# Patient Record
Sex: Male | Born: 1950
Health system: Southern US, Community
[De-identification: ages and names within clinical notes are randomized; demographics above are authoritative.]

## PROBLEM LIST (undated history)

## (undated) DIAGNOSIS — M66269 Spontaneous rupture of extensor tendons, unspecified lower leg: Secondary | ICD-10-CM

## (undated) DIAGNOSIS — M898X9 Other specified disorders of bone, unspecified site: Secondary | ICD-10-CM

## (undated) DIAGNOSIS — K219 Gastro-esophageal reflux disease without esophagitis: Secondary | ICD-10-CM

## (undated) DIAGNOSIS — I951 Orthostatic hypotension: Secondary | ICD-10-CM

## (undated) DIAGNOSIS — I10 Essential (primary) hypertension: Secondary | ICD-10-CM

## (undated) DIAGNOSIS — M7710 Lateral epicondylitis, unspecified elbow: Secondary | ICD-10-CM

## (undated) DIAGNOSIS — H269 Unspecified cataract: Secondary | ICD-10-CM

## (undated) DIAGNOSIS — D72819 Decreased white blood cell count, unspecified: Secondary | ICD-10-CM

## (undated) DIAGNOSIS — K5010 Crohn's disease of large intestine without complications: Secondary | ICD-10-CM

## (undated) DIAGNOSIS — I219 Acute myocardial infarction, unspecified: Secondary | ICD-10-CM

## (undated) DIAGNOSIS — K635 Polyp of colon: Secondary | ICD-10-CM

## (undated) DIAGNOSIS — E669 Obesity, unspecified: Secondary | ICD-10-CM

## (undated) DIAGNOSIS — I2510 Atherosclerotic heart disease of native coronary artery without angina pectoris: Secondary | ICD-10-CM

## (undated) DIAGNOSIS — E538 Deficiency of other specified B group vitamins: Secondary | ICD-10-CM

## (undated) DIAGNOSIS — K624 Stenosis of anus and rectum: Secondary | ICD-10-CM

## (undated) DIAGNOSIS — I236 Thrombosis of atrium, auricular appendage, and ventricle as current complications following acute myocardial infarction: Secondary | ICD-10-CM

## (undated) DIAGNOSIS — K5090 Crohn's disease, unspecified, without complications: Secondary | ICD-10-CM

## (undated) DIAGNOSIS — D539 Nutritional anemia, unspecified: Secondary | ICD-10-CM

## (undated) DIAGNOSIS — I255 Ischemic cardiomyopathy: Secondary | ICD-10-CM

## (undated) DIAGNOSIS — J069 Acute upper respiratory infection, unspecified: Secondary | ICD-10-CM

## (undated) DIAGNOSIS — J309 Allergic rhinitis, unspecified: Secondary | ICD-10-CM

## (undated) DIAGNOSIS — J302 Other seasonal allergic rhinitis: Secondary | ICD-10-CM

## (undated) DIAGNOSIS — E785 Hyperlipidemia, unspecified: Secondary | ICD-10-CM

## (undated) DIAGNOSIS — R42 Dizziness and giddiness: Secondary | ICD-10-CM

## (undated) DIAGNOSIS — I252 Old myocardial infarction: Secondary | ICD-10-CM

## (undated) HISTORY — DX: Dizziness and giddiness: R42

## (undated) HISTORY — DX: Crohn's disease of large intestine without complications: K50.10

## (undated) HISTORY — DX: Lateral epicondylitis, unspecified elbow: M77.10

## (undated) HISTORY — DX: Other seasonal allergic rhinitis: J30.2

## (undated) HISTORY — DX: Ischemic cardiomyopathy: I25.5

## (undated) HISTORY — DX: Gastro-esophageal reflux disease without esophagitis: K21.9

## (undated) HISTORY — DX: Unspecified cataract: H26.9

## (undated) HISTORY — DX: Nutritional anemia, unspecified: D53.9

## (undated) HISTORY — PX: TONSILLECTOMY AND ADENOIDECTOMY: SUR1326

## (undated) HISTORY — DX: Allergic rhinitis, unspecified: J30.9

## (undated) HISTORY — DX: Deficiency of other specified B group vitamins: E53.8

## (undated) HISTORY — DX: Decreased white blood cell count, unspecified: D72.819

## (undated) HISTORY — DX: Crohn's disease, unspecified, without complications: K50.90

## (undated) HISTORY — DX: Spontaneous rupture of extensor tendons, unspecified lower leg: M66.269

## (undated) HISTORY — DX: Essential (primary) hypertension: I10

## (undated) HISTORY — DX: Other specified disorders of bone, unspecified site: M89.8X9

## (undated) HISTORY — DX: Acute upper respiratory infection, unspecified: J06.9

## (undated) HISTORY — PX: COLONOSCOPY: SHX174

## (undated) HISTORY — DX: Polyp of colon: K63.5

## (undated) HISTORY — DX: Orthostatic hypotension: I95.1

## (undated) HISTORY — DX: Stenosis of anus and rectum: K62.4

## (undated) HISTORY — DX: Obesity, unspecified: E66.9

---
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
## 1998-02-23 ENCOUNTER — Encounter: Admission: RE | Admit: 1998-02-23 | Discharge: 1998-02-23 | Payer: Self-pay | Admitting: Sports Medicine

## 1999-03-15 ENCOUNTER — Encounter: Admission: RE | Admit: 1999-03-15 | Discharge: 1999-03-15 | Payer: Self-pay | Admitting: Sports Medicine

## 1999-11-16 ENCOUNTER — Ambulatory Visit (HOSPITAL_COMMUNITY): Admission: RE | Admit: 1999-11-16 | Discharge: 1999-11-16 | Payer: Self-pay | Admitting: Sports Medicine

## 1999-12-27 ENCOUNTER — Encounter: Admission: RE | Admit: 1999-12-27 | Discharge: 1999-12-27 | Payer: Self-pay | Admitting: Sports Medicine

## 2000-04-28 ENCOUNTER — Encounter: Admission: RE | Admit: 2000-04-28 | Discharge: 2000-04-28 | Payer: Self-pay | Admitting: Family Medicine

## 2001-04-02 ENCOUNTER — Encounter: Admission: RE | Admit: 2001-04-02 | Discharge: 2001-04-02 | Payer: Self-pay | Admitting: Family Medicine

## 2003-09-06 ENCOUNTER — Inpatient Hospital Stay (HOSPITAL_COMMUNITY): Admission: EM | Admit: 2003-09-06 | Discharge: 2003-09-08 | Payer: Self-pay | Admitting: Sports Medicine

## 2003-09-15 ENCOUNTER — Encounter: Admission: RE | Admit: 2003-09-15 | Discharge: 2003-09-15 | Payer: Self-pay | Admitting: Sports Medicine

## 2003-11-24 ENCOUNTER — Ambulatory Visit: Payer: Self-pay | Admitting: Sports Medicine

## 2004-01-24 ENCOUNTER — Ambulatory Visit: Payer: Self-pay | Admitting: Family Medicine

## 2004-03-13 ENCOUNTER — Ambulatory Visit: Payer: Self-pay | Admitting: Gastroenterology

## 2004-06-04 ENCOUNTER — Ambulatory Visit: Payer: Self-pay | Admitting: Sports Medicine

## 2005-01-17 ENCOUNTER — Ambulatory Visit: Payer: Self-pay | Admitting: Gastroenterology

## 2005-02-08 ENCOUNTER — Ambulatory Visit: Payer: Self-pay | Admitting: Gastroenterology

## 2005-02-08 ENCOUNTER — Encounter (INDEPENDENT_AMBULATORY_CARE_PROVIDER_SITE_OTHER): Payer: Self-pay | Admitting: *Deleted

## 2005-02-11 ENCOUNTER — Ambulatory Visit: Payer: Self-pay | Admitting: Gastroenterology

## 2005-03-02 ENCOUNTER — Emergency Department (HOSPITAL_COMMUNITY): Admission: EM | Admit: 2005-03-02 | Discharge: 2005-03-03 | Payer: Self-pay | Admitting: Emergency Medicine

## 2005-07-26 ENCOUNTER — Ambulatory Visit: Payer: Self-pay | Admitting: Gastroenterology

## 2005-08-07 ENCOUNTER — Ambulatory Visit: Payer: Self-pay | Admitting: Gastroenterology

## 2005-10-15 ENCOUNTER — Ambulatory Visit: Payer: Self-pay | Admitting: Sports Medicine

## 2005-11-21 ENCOUNTER — Ambulatory Visit: Payer: Self-pay | Admitting: Sports Medicine

## 2005-12-17 ENCOUNTER — Ambulatory Visit: Payer: Self-pay | Admitting: Sports Medicine

## 2005-12-17 ENCOUNTER — Ambulatory Visit (HOSPITAL_COMMUNITY): Admission: RE | Admit: 2005-12-17 | Discharge: 2005-12-17 | Payer: Self-pay | Admitting: Sports Medicine

## 2006-02-18 ENCOUNTER — Ambulatory Visit: Payer: Self-pay | Admitting: Sports Medicine

## 2006-05-22 DIAGNOSIS — K5090 Crohn's disease, unspecified, without complications: Secondary | ICD-10-CM | POA: Insufficient documentation

## 2006-05-22 DIAGNOSIS — R42 Dizziness and giddiness: Secondary | ICD-10-CM

## 2006-05-22 DIAGNOSIS — J309 Allergic rhinitis, unspecified: Secondary | ICD-10-CM | POA: Insufficient documentation

## 2006-05-22 DIAGNOSIS — J4520 Mild intermittent asthma, uncomplicated: Secondary | ICD-10-CM

## 2006-05-22 DIAGNOSIS — L2089 Other atopic dermatitis: Secondary | ICD-10-CM

## 2006-05-22 DIAGNOSIS — E669 Obesity, unspecified: Secondary | ICD-10-CM | POA: Insufficient documentation

## 2006-05-22 DIAGNOSIS — J45909 Unspecified asthma, uncomplicated: Secondary | ICD-10-CM | POA: Insufficient documentation

## 2006-07-10 ENCOUNTER — Telehealth: Payer: Self-pay | Admitting: *Deleted

## 2006-09-09 ENCOUNTER — Ambulatory Visit: Payer: Self-pay | Admitting: Family Medicine

## 2006-09-09 ENCOUNTER — Telehealth: Payer: Self-pay | Admitting: *Deleted

## 2006-09-09 DIAGNOSIS — J069 Acute upper respiratory infection, unspecified: Secondary | ICD-10-CM | POA: Insufficient documentation

## 2006-12-19 ENCOUNTER — Telehealth: Payer: Self-pay | Admitting: *Deleted

## 2007-03-04 ENCOUNTER — Telehealth: Payer: Self-pay | Admitting: *Deleted

## 2007-10-27 ENCOUNTER — Ambulatory Visit: Payer: Self-pay | Admitting: Sports Medicine

## 2007-10-27 DIAGNOSIS — M898X9 Other specified disorders of bone, unspecified site: Secondary | ICD-10-CM | POA: Insufficient documentation

## 2007-10-27 DIAGNOSIS — M66269 Spontaneous rupture of extensor tendons, unspecified lower leg: Secondary | ICD-10-CM | POA: Insufficient documentation

## 2007-11-04 ENCOUNTER — Ambulatory Visit: Payer: Self-pay | Admitting: Sports Medicine

## 2008-01-13 ENCOUNTER — Telehealth: Payer: Self-pay | Admitting: Gastroenterology

## 2008-01-19 ENCOUNTER — Telehealth: Payer: Self-pay | Admitting: Gastroenterology

## 2008-01-26 ENCOUNTER — Ambulatory Visit: Payer: Self-pay | Admitting: Sports Medicine

## 2008-01-26 DIAGNOSIS — M7710 Lateral epicondylitis, unspecified elbow: Secondary | ICD-10-CM | POA: Insufficient documentation

## 2008-02-08 DIAGNOSIS — D519 Vitamin B12 deficiency anemia, unspecified: Secondary | ICD-10-CM

## 2008-02-08 DIAGNOSIS — K219 Gastro-esophageal reflux disease without esophagitis: Secondary | ICD-10-CM | POA: Insufficient documentation

## 2008-02-12 ENCOUNTER — Ambulatory Visit: Payer: Self-pay | Admitting: Gastroenterology

## 2008-02-12 LAB — CONVERTED CEMR LAB
Albumin: 3.8 g/dL (ref 3.5–5.2)
BUN: 15 mg/dL (ref 6–23)
Calcium: 9 mg/dL (ref 8.4–10.5)
Eosinophils Relative: 0.5 % (ref 0.0–5.0)
Folate: >20 ng/mL
GFR calc Af Amer: 99 mL/min
Glucose, Bld: 130 mg/dL — ABNORMAL HIGH (ref 70–99)
HCT: 42.1 % (ref 39.0–52.0)
Hemoglobin: 14.3 g/dL (ref 13.0–17.0)
Iron: 132 ug/dL (ref 42–165)
MCV: 105.3 fL — ABNORMAL HIGH (ref 78.0–100.0)
Monocytes Absolute: 0.6 10*3/uL (ref 0.1–1.0)
Monocytes Relative: 11.3 % (ref 3.0–12.0)
Neutro Abs: 3 10*3/uL (ref 1.4–7.7)
RDW: 13.7 % (ref 11.5–14.6)
Saturation Ratios: 47.1 % (ref 20.0–50.0)
Total Protein: 6.4 g/dL (ref 6.0–8.3)
Transferrin: 200.3 mg/dL — ABNORMAL LOW (ref >=212.0)
WBC: 4.9 10*3/uL (ref 4.5–10.5)

## 2008-02-26 ENCOUNTER — Encounter: Payer: Self-pay | Admitting: Gastroenterology

## 2008-02-26 ENCOUNTER — Ambulatory Visit: Payer: Self-pay | Admitting: Gastroenterology

## 2008-02-26 DIAGNOSIS — K635 Polyp of colon: Secondary | ICD-10-CM

## 2008-02-26 HISTORY — DX: Polyp of colon: K63.5

## 2008-03-01 ENCOUNTER — Encounter: Payer: Self-pay | Admitting: Gastroenterology

## 2008-03-09 ENCOUNTER — Ambulatory Visit: Payer: Self-pay | Admitting: Family Medicine

## 2008-03-09 ENCOUNTER — Encounter: Payer: Self-pay | Admitting: Sports Medicine

## 2008-03-09 DIAGNOSIS — R7303 Prediabetes: Secondary | ICD-10-CM

## 2008-03-09 LAB — CONVERTED CEMR LAB
Cholesterol: 207 mg/dL — ABNORMAL HIGH (ref 0–200)
Total CHOL/HDL Ratio: 3.3
Triglycerides: 86 mg/dL (ref <150)
VLDL: 17 mg/dL (ref 0–40)

## 2008-03-30 ENCOUNTER — Encounter: Payer: Self-pay | Admitting: Gastroenterology

## 2008-03-30 ENCOUNTER — Telehealth: Payer: Self-pay | Admitting: Gastroenterology

## 2008-12-09 ENCOUNTER — Telehealth (INDEPENDENT_AMBULATORY_CARE_PROVIDER_SITE_OTHER): Payer: Self-pay | Admitting: *Deleted

## 2009-03-15 ENCOUNTER — Telehealth: Payer: Self-pay | Admitting: Gastroenterology

## 2009-04-17 ENCOUNTER — Telehealth: Payer: Self-pay | Admitting: Gastroenterology

## 2009-04-20 ENCOUNTER — Telehealth: Payer: Self-pay | Admitting: Gastroenterology

## 2009-04-24 ENCOUNTER — Encounter: Payer: Self-pay | Admitting: Gastroenterology

## 2009-04-27 ENCOUNTER — Ambulatory Visit: Payer: Self-pay | Admitting: Gastroenterology

## 2009-04-27 DIAGNOSIS — K5010 Crohn's disease of large intestine without complications: Secondary | ICD-10-CM | POA: Insufficient documentation

## 2009-04-27 DIAGNOSIS — K5090 Crohn's disease, unspecified, without complications: Secondary | ICD-10-CM | POA: Insufficient documentation

## 2009-04-27 DIAGNOSIS — K209 Esophagitis, unspecified without bleeding: Secondary | ICD-10-CM | POA: Insufficient documentation

## 2009-04-27 LAB — CONVERTED CEMR LAB
AST: 30 units/L (ref 0–37)
BUN: 15 mg/dL (ref 6–23)
Basophils Relative: 0.5 % (ref 0.0–3.0)
Calcium: 9.7 mg/dL (ref 8.4–10.5)
Creatinine, Ser: 1.1 mg/dL (ref 0.4–1.5)
Eosinophils Absolute: 0 10*3/uL (ref 0.0–0.7)
Ferritin: 214.9 ng/mL (ref 22.0–322.0)
Folate: >20 ng/mL
GFR calc non Af Amer: 72.63 mL/min (ref >60)
Iron: 137 ug/dL (ref 42–165)
MCHC: 32.7 g/dL (ref 30.0–36.0)
MCV: 106.9 fL — ABNORMAL HIGH (ref 78.0–100.0)
Monocytes Absolute: 0.4 10*3/uL (ref 0.1–1.0)
Neutrophils Relative %: 63 % (ref 43.0–77.0)
Potassium: 5.1 meq/L (ref 3.5–5.1)
RBC: 4.15 M/uL — ABNORMAL LOW (ref 4.22–5.81)
TSH: 0.77 microintl units/mL (ref 0.35–5.50)
Total Bilirubin: 1.1 mg/dL (ref 0.3–1.2)
Vitamin B-12: 296 pg/mL (ref 211–911)

## 2009-07-24 ENCOUNTER — Telehealth (INDEPENDENT_AMBULATORY_CARE_PROVIDER_SITE_OTHER): Payer: Self-pay | Admitting: *Deleted

## 2010-04-26 NOTE — Progress Notes (Signed)
Summary: needs lab orders  Phone Note Call from Patient Call back at Work Phone [phone]   Caller: Patient Call For: Sharlett Iles Reason for Call: Talk to Nurse Summary of Call: Patient wants order for labs put in the system before his appt 04-27-09. Patient would like refills on his rx until his appt day. Initial call taken by: Ronalee Red,  April 17, 2009 9:17 AM  Follow-up for Phone Call        Pt is sch for OV 04/27/09.  Pt on 6MP.  Has not had any lab work done in a while.  What labs are needed?  (pt will come prior to OV for labs) Follow-up by: Alberteen Spindle RN,  April 17, 2009 9:59 AM  Additional Follow-up for Phone Call Additional follow up Details #1::        CBC,SMA 20 SED RATE Additional Follow-up by: Sable Feil MD Marval Regal,  April 17, 2009 11:45 AM    Additional Follow-up for Phone Call Additional follow up Details #2::    Lab order put in [facility].  LM for pt to call re which med he needs refilled. Butch Penny Surface RN  April 17, 2009 11:50 AM  Pt called back.  Needs refill on 6MP and Folic acid sent to [facility] on [address].    Follow-up by: Alberteen Spindle RN,  April 17, 2009 2:28 PM  Prescriptions: MERCAPTOPURINE 50 MG TABS (MERCAPTOPURINE) Pt takes 100 mg every am once daily  #60 Tablet x 1   Entered by:   Alberteen Spindle RN   Authorized by:   Sable Feil MD [facility]   Signed by:   Alberteen Spindle RN on 04/17/2009   Method used:   Electronically to        [id number] (retail)       [address]       [place], [place]  [address]       Ph: [phone] or [phone]       Fax: [phone]   RxID:   [id number] FOLIC ACID 1 MG TABS (FOLIC ACID) TAKE ONE by mouth once daily  #30 Tablet x 1   Entered by:   Alberteen Spindle RN   Authorized by:   Sable Feil MD [facility]   Signed by:   Alberteen Spindle RN on 04/17/2009   Method used:   Electronically to        [id number] (retail)       [address]       [place],   [address]     Ph: [phone] or [phone]       Fax: [phone]   RxID:   [id number]

## 2010-04-26 NOTE — Progress Notes (Signed)
Summary: needs prior auth  Phone Note Call from Patient Call back at Home Phone [phone]   Caller: Patient Call For: Sharlett Iles Reason for Call: Talk to Nurse Summary of Call: Patient needs prior auth for his Nexium refill Initial call taken by: Ronalee Red,  April 20, 2009 1:41 PM  Follow-up for Phone Call        Form requested form [employer].  To be faxed shortly. Butch Penny Surface RN  April 24, 2009 9:18 AM  Nexium approved until 04/24/10. Follow-up by: Alberteen Spindle RN,  April 25, 2009 8:27 AM

## 2010-04-26 NOTE — Medication Information (Signed)
Summary: Prior [employer] Approved for Nexium/[employer]  Prior [employer] Approved for Nexium/[employer]   Imported By: Phillis Knack 05/04/2009 10:26:31  _____________________________________________________________________  External Attachment:    Type:   Image     Comment:   External Document

## 2010-04-26 NOTE — Assessment & Plan Note (Signed)
Summary: rx renewal...em   History of Present Illness Visit Type: Follow-up Visit Primary GI MD: Verl Blalock MD FACP FAGA Primary Provider: [facility] Requesting Provider: n/a Chief Complaint: f/u Crohns disease. Pt states he is doing good and needs refills on 80m and folic acid.  History of Present Illness:   60year old white male with chronic Crohn's disease of the colon currently in remission on 6-MP 100 mg a day. He takes Nexium 40 mg daily for acid reflux, also folic acid 1 mg a day. He has asthmatic bronchitis and uses p.r.n. steroids. He is asymptomatic terms of any cardiovascular, pulmonary, gastrointestinal, or genitourinary problems. Last labs were reviewed from a year ago were all normal. Colonoscopy one year ago shows chronic scarring with pseudopolyposis but no dysplasia on biopsies.   GI Review of Systems      Denies abdominal pain, acid reflux, belching, bloating, chest pain, dysphagia with liquids, dysphagia with solids, heartburn, loss of appetite, nausea, vomiting, vomiting blood, weight loss, and  weight gain.        Denies anal fissure, black tarry stools, change in bowel habit, constipation, diarrhea, diverticulosis, fecal incontinence, heme positive stool, hemorrhoids, irritable bowel syndrome, jaundice, light color stool, liver problems, rectal bleeding, and  rectal pain.    Current Medications (verified): 1)  Advair Hfa 115-21 Mcg/act Aero (Fluticasone-Salmeterol) .... Inhale 1 Puff As Directed Twice A Day 2)  Flonase 50 Mcg/act Susp (Fluticasone Propionate) .... Spray 1 Puff Into Both Nostrils Once A Day 3)  Folic Acid 1 Mg Tabs (Folic Acid) .... Take One By Mouth Once Daily 4)  Mercaptopurine 50 Mg Tabs (Mercaptopurine) .... Pt Takes 100 Mg Every Am Once Daily 5)  Nexium 40 Mg Cpdr (Esomeprazole Magnesium) ..Marland Kitchen. 1 Once Daily One Hour Before A Meal 6)  Proventil Hfa 108 (90 Base) Mcg/act Aers (Albuterol Sulfate) .... Inhale 2 Puff Every  Four Hours 7)  Singulair 10 Mg Tabs (Montelukast Sodium) .... Take One Tablet Daily 8)  Proventil Hfa 108 (90 Base) Mcg/act Aers (Albuterol Sulfate) .... As Needed  Allergies (verified): 1)  Amoxicillin (Amoxicillin) 2)  Sulfamethoxazole (Sulfamethoxazole)  Past History:  Past medical, surgical, family and social histories (including risk factors) reviewed for relevance to current acute and chronic problems.  Past Medical History: Reviewed history from 02/08/2008 and no changes required. currently on 100 6 mp daily with monitoring, experimental trial on interleukin 10  Current Problems:  GERD (ICD-530.81) B12 DEFICIENCY (ICD-266.2) LATERAL EPICONDYLITIS, LEFT (ICD-726.32) BONE SPUR (ICD-726.91) PATELLAR TENDON RUPTURE (ICD-727.66) URI (ICD-465.9) VERTIGO NOS OR DIZZINESS (ICD-780.4) RHINITIS, ALLERGIC (ICD-477.9) OBESITY, NOS (ICD-278.00) ECZEMA, ATOPIC DERMATITIS (ICD-691.8) CROHN'S DISEASE (ICD-555.9) ASTHMA, UNSPECIFIED (ICD-493.90)  Past Surgical History: Reviewed history from 02/08/2008 and no changes required. Tonsillectomy and adenoidectomy as a child  Family History: Reviewed history from 02/12/2008 and no changes required. Brother 655 DM, Father dead- 630yrs. Diabetes, circulatory, MI, Mother dead- 927yrs., Sister 664a&w No FH of Colon Cancer:  Social History: Reviewed history from 02/12/2008 and no changes required. 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; non smoker; rare any etoh Illicit Drug Use - no  Review of Systems  The patient denies allergy/sinus, anemia, anxiety-new, arthritis/joint pain, back pain, blood in urine, breast changes/lumps, change in vision, confusion, cough, coughing up blood, depression-new, fainting, fatigue, fever, headaches-new, hearing problems, heart murmur, heart rhythm changes, itching, menstrual pain, muscle pains/cramps, night sweats, nosebleeds, pregnancy symptoms, shortness of breath, skin rash,  sleeping problems, sore throat, swelling of  feet/legs, swollen lymph glands, thirst - excessive , urination - excessive , urination changes/pain, urine leakage, vision changes, and voice change.    Vital Signs:  Patient profile:   60 year old male Height:      67 inches Weight:      186.50 pounds BMI:     29.32 Pulse rate:   80 / minute Pulse rhythm:   regular BP sitting:   112 / 72  (right arm) Cuff size:   regular  Vitals Entered By: Marlon Pel CMA Deborra Medina) (April 27, 2009 8:38 AM)  Physical Exam  General:  Well developed, well nourished, no acute distress.healthy appearing.   Head:  Normocephalic and atraumatic. Eyes:  PERRLA, no icterus.exam deferred to patient's ophthalmologist.   Lungs:  Clear throughout to auscultation. Heart:  Regular rate and rhythm; no murmurs, rubs,  or bruits. Abdomen:  Soft, nontender and nondistended. No masses, hepatosplenomegaly or hernias noted. Normal bowel sounds. Extremities:  No clubbing, cyanosis, edema or deformities noted. Neurologic:  Alert and  oriented x4;  grossly normal neurologically. Psych:  Alert and cooperative. Normal mood and affect.   Impression & Recommendations:  Problem # 1:  CROHN'S DISEASE, LARGE INTESTINE (ICD-555.1) Assessment Improved Check yearly labs and continue all medications as outlined above. Yearly followup or p.r.n. as needed. Orders: TLB-CBC Platelet - w/Differential (85025-CBCD) TLB-BMP (Basic Metabolic Panel-BMET) (40981-XBJYNWG) TLB-Hepatic/Liver Function Pnl (80076-HEPATIC) TLB-TSH (Thyroid Stimulating Hormone) (84443-TSH) TLB-B12, Serum-Total ONLY (95621-H08) TLB-Ferritin (65784-ONG) TLB-Folic Acid (Folate) (29528-UXL) TLB-IBC Pnl (Iron/FE;Transferrin) (83550-IBC) TLB-PSA (Prostate Specific Antigen) (84153-PSA)  Problem # 2:  GERD (ICD-530.81) Assessment: Improved Continue chronic reflux regime and daily Nexium therapy.  Problem # 3:  B12 DEFICIENCY (ICD-266.2) Assessment:  Unchanged Repeat anemia profile.  Problem # 4:  ASTHMA, UNSPECIFIED (ICD-493.90) Assessment: Improved Followup With Dr. Oneida Alar As Needed  Patient Instructions: 1)  Copy sent to : Dr. Hector Shade 2)  Please continue current medications.  3)  Labs Pending 4)  Please schedule a follow-up appointment in 1 year. 5)  The medication list was reviewed and reconciled.  All changed / newly prescribed medications were explained.  A complete medication list was provided to the patient / caregiver.

## 2010-04-26 NOTE — Progress Notes (Signed)
  Phone Note Other Incoming   Request: Send information Summary of Call: Request for records received from [facility]. Request forwarded to [facility].

## 2010-05-14 ENCOUNTER — Encounter: Payer: Self-pay | Admitting: Gastroenterology

## 2010-05-22 NOTE — Medication Information (Signed)
Summary: Approved/[employer]  Approved/[employer]   Imported By: Bubba Hales 05/17/2010 09:09:57  _____________________________________________________________________  External Attachment:    Type:   Image     Comment:   External Document

## 2010-08-10 NOTE — Discharge Summary (Signed)
NAME:  Darryl [name], Darryl [name] NO.:  [id number]   MEDICAL RECORD NO.:  [id number]                   PATIENT TYPE:  INP   LOCATION:  9169                                 FACILITY:  [facility]   PHYSICIAN:  Billey Chang, M.D.                  DATE OF BIRTH:  22-Oct-1950   DATE OF ADMISSION:  09/06/2003  DATE OF DISCHARGE:  09/08/2003                                 DISCHARGE SUMMARY   PRIMARY CARE PHYSICIAN:  Peterson Ao B. Oneida Alar, M.D.   REFERRING PHYSICIAN:  Dr. Oneida Alar.   CONSULTATIONS:  None.   DISCHARGE DIAGNOSES:  1. Cellulitis of the left foot with lymphangitic spread to the left groin.  2. Crohn's disease.  3. Asthma.  4. Allergic rhinitis.   PROCEDURE:  None.   HISTORY OF PRESENT ILLNESS:  Please see the chart for full details, but in  short, the patient is a 60 year old male with a history of Crohn's disease,  who presented on the day of admission with a one-day history of left foot  pain with redness and some swelling.  The patient was able to mow the lawn  the day prior to admission and ran at the gym on that a.m. with minimal  discomfort.  However, on the day of admission, the patient started noticing  fevers and pain up into his left groin after running on the treadmill and  noticed some streaking redness of his leg after running.  The patient denies  history of insect bite or traumatic injury at the site of infection and  denies previous history of similar event.  Denies decreased energy, nausea,  vomiting, and diarrhea.   LABORATORY DATA:  Blood cultures negative x2 at the time of discharge.  WBC  8.4, hemoglobin 14.4, hematocrit 41.1, platelets 254.  Sodium 142, potassium  4.2, chloride 107, CO2 29, BUN 11, creatinine 1.0, platelets 99.   HOSPITAL COURSE:  1. Left cellulitis with lymphadenitis spread to the left groin.  There is no     clear history of source of infection.  There was no obvious site of     insect bite or site of injury.  However,  on admission the patient had an     increased white blood cell count of 16.1 and was having low grade fevers     and was febrile at 100.8 on admission.  Given the patient's streaking up     the leg and the fact that the patient was on 6MP for his Crohn's disease     making him slightly immunocompromised, he was started initially on     Clindamycin IV to cover broad spectrum of organisms including community-     acquired MRSA.  This antibiotic was also chosen as the patient had a     penicillin allergy and was admitted early in the morning and we were     unable to contact his  primary physician to determine whether or not the     patient could tolerate cephalosporins.  After contact with his primary     physician, it was determined that he could tolerate cephalosporins, the     patient's antibiotic regimen was changed to Ceftriaxone.  The patient     tolerated two days of Ceftriaxone well.  He continued to be febrile for     another day and a half on the Ceftriaxone, but did have decreasing     erythema and redness in both his groin and the area of presumed     cellulitis over his left foot.  At the time of discharge, the patient had     been afebrile for a day on the IV ceftriaxone and therefore was     discharged on p.o. Keflex 500 mg p.o. q.i.d. with follow-up at one weeks'     time with Dr. Oneida Alar on September 15, 2003, at 10 a.m.   1. Crohn's disease.  The patient was maintained on his outpatient regimen of     6MP 100 mg p.o. daily and had no difficulty throughout his     hospitalization.   1. Asthma.  The patient had no difficulty with asthma throughout his     hospitalization and was maintained on his home medications.   1. GERD.  The patient was maintained on his home medications throughout     hospitalization with no difficulty.   1. Allergic rhinitis.  The patient was continued on his home medications     with no complications throughout hospitalization.   DISCHARGE  INSTRUCTIONS:  The patient was instructed to return to emergency  room or to call the clinic if he developed high fevers or returning fevers  or increasing redness or swelling in his foot or groin area and to follow up  with Dr. Oneida Alar on September 15, 2003, at 10 a.m. and to complete his course of  Keflex 500 mg for 10 days.   DISCHARGE MEDICATIONS:  1. Calcium carbonate p.o. 500 mg p.o. b.i.d.  2. Flonase two puffs daily.  3. Flovent 220 one puff b.i.d.  4. Prevacid 30 mg p.o. daily.  5. Proventil two puffs q.4h p.r.n.  6. Singulair 10 mg p.o. daily.  7. Theo-Dur 400 mg p.o. daily.  8. Vitamin D and Folic acid one tablet each p.o. daily.  9. 6MP 100 mg p.o. daily.  10.      Keflex 500 mg p.o. q.i.d. (four times a day) for 10 days.   DNR STATUS:  Full code.      Manus Rudd, MD                          Billey Chang, M.D.    SJ/MEDQ  D:  09/08/2003  T:  09/09/2003  Job:  [id number]   cc:   Wolfgang Phoenix. Oneida Alar, M.D.  Fax: [phone]

## 2010-10-29 ENCOUNTER — Other Ambulatory Visit: Payer: Self-pay | Admitting: Sports Medicine

## 2010-12-07 ENCOUNTER — Other Ambulatory Visit: Payer: Self-pay | Admitting: *Deleted

## 2010-12-07 MED ORDER — MONTELUKAST SODIUM 10 MG PO TABS: 10.0000 mg | ORAL_TABLET | Freq: Every day | ORAL | Status: DC

## 2011-02-28 ENCOUNTER — Telehealth: Payer: Self-pay | Admitting: Sports Medicine

## 2011-03-05 ENCOUNTER — Other Ambulatory Visit: Payer: Self-pay | Admitting: Gastroenterology

## 2011-03-08 ENCOUNTER — Other Ambulatory Visit: Payer: Self-pay | Admitting: *Deleted

## 2011-03-08 ENCOUNTER — Encounter: Payer: Self-pay | Admitting: Gastroenterology

## 2011-03-08 ENCOUNTER — Telehealth: Payer: Self-pay | Admitting: Gastroenterology

## 2011-03-08 MED ORDER — ESOMEPRAZOLE MAGNESIUM 40 MG PO CPDR: 40.0000 mg | DELAYED_RELEASE_CAPSULE | Freq: Every day | ORAL | Status: DC

## 2011-03-08 NOTE — Telephone Encounter (Signed)
ERROR

## 2011-03-08 NOTE — Telephone Encounter (Signed)
Left message that patients meds were denied bc he needs office before he gets a refill, if he makes an office visit he can have one month refill.

## 2011-03-11 ENCOUNTER — Other Ambulatory Visit: Payer: Self-pay | Admitting: Gastroenterology

## 2011-03-11 MED ORDER — MERCAPTOPURINE 50 MG PO TABS: ORAL_TABLET | ORAL | Status: DC

## 2011-03-11 MED ORDER — FOLIC ACID 1 MG PO TABS: 1.0000 mg | ORAL_TABLET | Freq: Every day | ORAL | Status: DC

## 2011-03-11 NOTE — Telephone Encounter (Signed)
rxs sent to pharmacy, pt aware.

## 2011-03-21 ENCOUNTER — Other Ambulatory Visit: Payer: Self-pay | Admitting: Sports Medicine

## 2011-03-22 ENCOUNTER — Encounter: Payer: Self-pay | Admitting: *Deleted

## 2011-03-28 ENCOUNTER — Encounter: Payer: Self-pay | Admitting: Gastroenterology

## 2011-03-28 ENCOUNTER — Other Ambulatory Visit (INDEPENDENT_AMBULATORY_CARE_PROVIDER_SITE_OTHER): Payer: BC Managed Care – PPO

## 2011-03-28 ENCOUNTER — Ambulatory Visit (INDEPENDENT_AMBULATORY_CARE_PROVIDER_SITE_OTHER): Payer: BC Managed Care – PPO | Admitting: Gastroenterology

## 2011-03-28 ENCOUNTER — Telehealth: Payer: Self-pay | Admitting: *Deleted

## 2011-03-28 VITALS — BP 138/84 | HR 65 | Ht 67.0 in | Wt 186.6 lb

## 2011-03-28 DIAGNOSIS — K5090 Crohn's disease, unspecified, without complications: Secondary | ICD-10-CM

## 2011-03-28 LAB — CBC WITH DIFFERENTIAL/PLATELET
Basophils Absolute: 0 10*3/uL (ref 0.0–0.1)
Basophils Relative: 0.5 % (ref 0.0–3.0)
Eosinophils Absolute: 0 10*3/uL (ref 0.0–0.7)
Lymphocytes Relative: 32 % (ref 12.0–46.0)
MCHC: 34.2 g/dL (ref 30.0–36.0)
Neutrophils Relative %: 59.3 % (ref 43.0–77.0)
Platelets: 326 10*3/uL (ref 150.0–400.0)
RBC: 3.91 Mil/uL — ABNORMAL LOW (ref 4.22–5.81)
RDW: 13.4 % (ref 11.5–14.6)

## 2011-03-28 LAB — BASIC METABOLIC PANEL
Calcium: 9.2 mg/dL (ref 8.4–10.5)
GFR: 82.46 mL/min (ref >60.00)
Glucose, Bld: 122 mg/dL — ABNORMAL HIGH (ref 70–99)
Sodium: 141 mEq/L (ref 135–145)

## 2011-03-28 LAB — HEPATIC FUNCTION PANEL
Alkaline Phosphatase: 70 U/L (ref 39–117)
Bilirubin, Direct: 0.1 mg/dL (ref 0.0–0.3)
Total Bilirubin: 0.8 mg/dL (ref 0.3–1.2)

## 2011-03-28 LAB — IBC PANEL: Iron: 157 ug/dL (ref 42–165)

## 2011-03-28 MED ORDER — MERCAPTOPURINE 50 MG PO TABS: ORAL_TABLET | ORAL | Status: DC

## 2011-03-28 NOTE — Progress Notes (Signed)
This is a 61 year old Caucasian male with Crohn's colitis over 20 years, currently in remission on 6-MP 100 mg a day. Last colonoscopy was in December 2009 and was unremarkable except for left colon pseudopolyposis. Random biopsies showed no evidence of dysplasia. The patient denies any general medical or gastrointestinal problems at this time. He has regular bowel movements without melena or hematochezia. His appetite is good and his weight is stable. He is followed by Dr. Oneida Alar for his asthma, he uses when necessary inhalers. The patient has noticed that he will have watery diarrhea if he forgets his 6-MP dosage.  Current Medications, Allergies, Past Medical History, Past Surgical History, Family History and Social History were reviewed in [employer] record.  Pertinent Review of Systems Negative   Physical Exam: Healthy appearing patient in no distress. He does have some facial telangiectasias but no other evidence of chronic liver disease. Chest is clear cardiac exam shows a regular rhythm without murmurs gallops or rubs. There is no organomegaly, abdominal masses or tenderness. Bowel sounds are normal. Peripheral extremities are unremarkable. Mental status normal.    Assessment and Plan: Chronic inflammatory bowel disease in remission on 6-MP therapy. I have suggested to the patient we perhaps discontinue 6-MP, but he is reluctant to do so because the cause of past experiences. I have reviewed the increased risk of lymphoma with the patient, and he has opted to continue with yearly checkups and lab examinations. He is due for followup colonoscopy with dysplasia screening, he is to call the sprain to schedule this procedure. Encounter Diagnosis  Name Primary?  . Regional enteritis of unspecified site Yes

## 2011-03-28 NOTE — Telephone Encounter (Signed)
Pt aware.

## 2011-03-28 NOTE — Patient Instructions (Signed)
Please go to the basement today for your labs.  Your prescription(s) have been sent to you pharmacy.  Call back in the Spring to schedule you Previsit and Colonoscopy.

## 2011-03-28 NOTE — Telephone Encounter (Signed)
Message copied by Sheral Flow on Thu Mar 28, 2011  2:24 PM ------      Message from: Sharlett Iles, DAVID R      Created: Thu Mar 28, 2011  2:10 PM       Have him stop any multivitamins or other products with iron.

## 2011-04-05 ENCOUNTER — Other Ambulatory Visit: Payer: Self-pay | Admitting: Gastroenterology

## 2011-04-09 ENCOUNTER — Ambulatory Visit (INDEPENDENT_AMBULATORY_CARE_PROVIDER_SITE_OTHER): Payer: BC Managed Care – PPO | Admitting: Sports Medicine

## 2011-04-09 DIAGNOSIS — J45909 Unspecified asthma, uncomplicated: Secondary | ICD-10-CM

## 2011-04-09 DIAGNOSIS — J309 Allergic rhinitis, unspecified: Secondary | ICD-10-CM

## 2011-04-09 DIAGNOSIS — J329 Chronic sinusitis, unspecified: Secondary | ICD-10-CM

## 2011-04-09 DIAGNOSIS — K5090 Crohn's disease, unspecified, without complications: Secondary | ICD-10-CM

## 2011-04-09 MED ORDER — FLUTICASONE PROPIONATE 50 MCG/ACT NA SUSP: 1.0000 | Freq: Every day | NASAL | Status: DC

## 2011-04-09 NOTE — Assessment & Plan Note (Signed)
We will renew his fluticasone.  However with the chronic sinus congestion and periodic sinus infections I think we need to rule out whether he has chronic sinusitis. He may also need endoscopic evaluation of his nasal passageway because of the recurrent bleeding.  . Schedule a limited CT of the sinus

## 2011-04-09 NOTE — Assessment & Plan Note (Signed)
He has been well controlled on his current regimen. He will continue see Dr. Sharlett Iles and may wean his 6-mercaptopurine somewhat.

## 2011-04-09 NOTE — Assessment & Plan Note (Signed)
This seems to be stable and did not make any changes today. He seems to have gotten a lot of relief from the 6 mercaptopurine that is used for his Crohn's disease./ He does plan to wean to 50 mg a day and will need to see if that affects his asthma.

## 2011-04-09 NOTE — Progress Notes (Signed)
  Subjective:    Patient ID: Darryl [name], male    DOB: July 21, 1950, 61 y.o.   MRN: [id number]  HPI  Patient is doing OK with no flares from Crohn's recently  Asthma has been pretty stable Recent URI did not worsen this Left nostril seems closed with poor air flow Has been on 6 MP for years for Crohn's but this also helps his asthma He is sensitive to weather- cold exacerbates asthma. No problems with coughing when he exercises. Uses fluticasone spray, helps but he is out of this now.  Peak flows normally run 500-550. Has started cycling.  Has increased snoring, feels tired when he wakes up.   Runs on treadmill and works out at [employer], does not run outside. None of these activities seem to be causing him to wheeze or cough.  Note that he does not feel that he can breathe out of his left nostril. While we had treated him for allergic rhinitis he continues to have bleeding from this nostril and periodic symptoms of sinus infections.    Review of Systems     Objective:   Physical Exam   NAD Ears- TMs normal bilat Nose- rt turbinates swollen, lt turbinate has bleeding on medial complex Throat- no irritation Lungs- clear, no wheezing      Assessment & Plan:

## 2011-04-10 ENCOUNTER — Ambulatory Visit
Admission: RE | Admit: 2011-04-10 | Discharge: 2011-04-10 | Disposition: A | Discharge status: Home / Self Care | Payer: BC Managed Care – PPO | Source: Ambulatory Visit | Attending: Sports Medicine | Admitting: Sports Medicine

## 2011-04-10 IMAGING — CT CT PARANASAL SINUSES LIMITED
1 series · 9 of 11 positions shown, 12 images · non-contrast
Comparison: None.

CLINICAL DATA: 60-year-old male with facial pressure, congestion,
headache, question sinusitis.

CT LIMITED SINUSES WITHOUT CONTRAST
TECHNIQUE: Multidetector CT images of the paranasal sinuses were
obtained in a single plane without contrast.

[Series 3: coronal soft · axial · 0.33mm/px · z∈[+40,+120]mm · 9 of 11 slices shown, 12 images]
[im 2/11  brain]
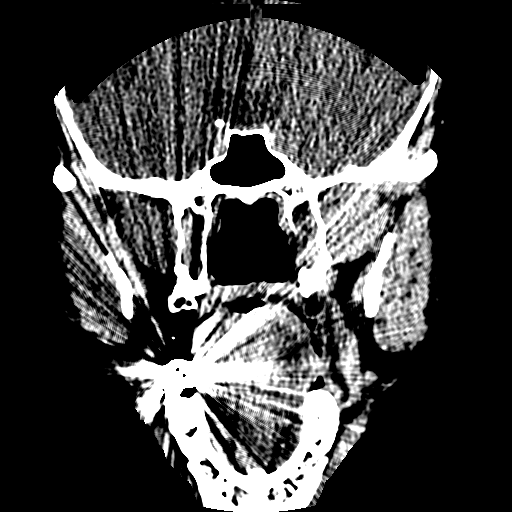
[im 2/11  bone]
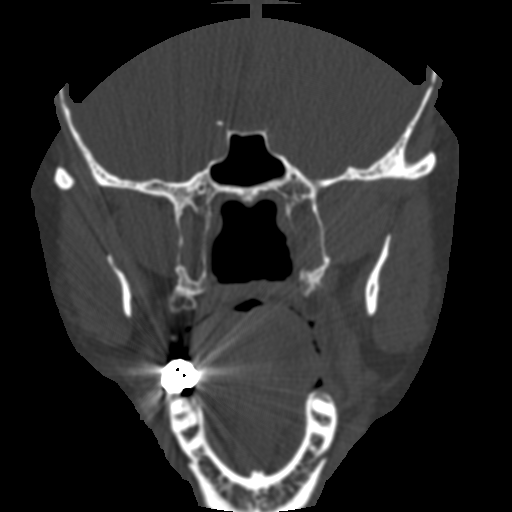
[im 3/11  bone]
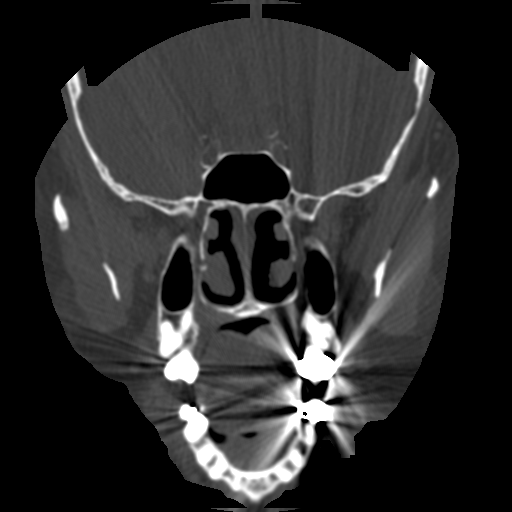
[im 4/11  bone]
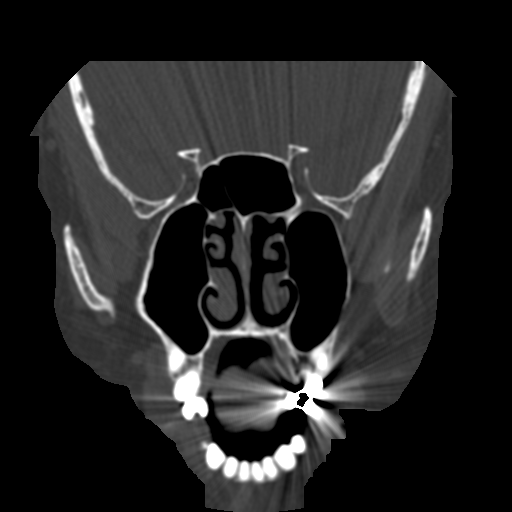
[im 5/11  bone]
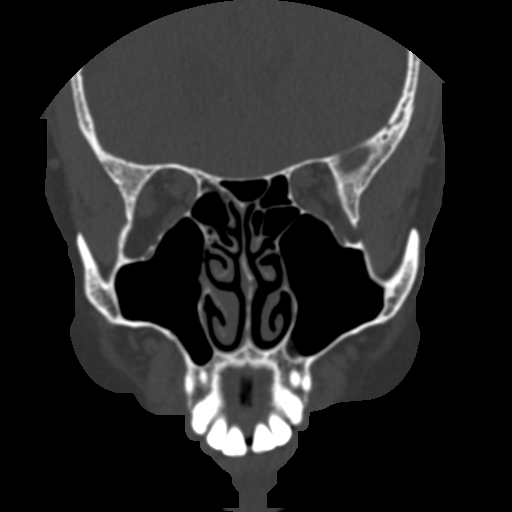
[im 6/11  brain]
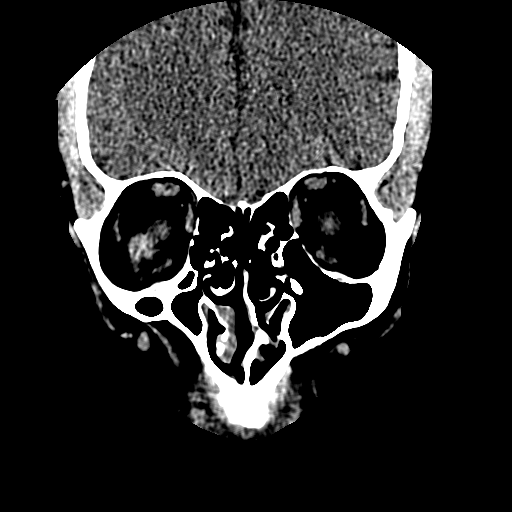
[im 6/11  bone]
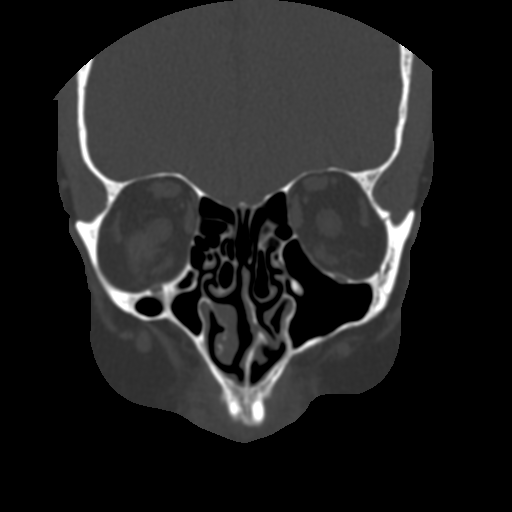
[im 7/11  bone]
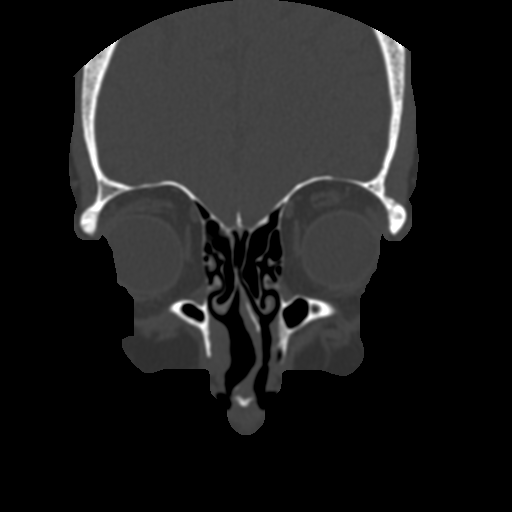
[im 8/11  bone]
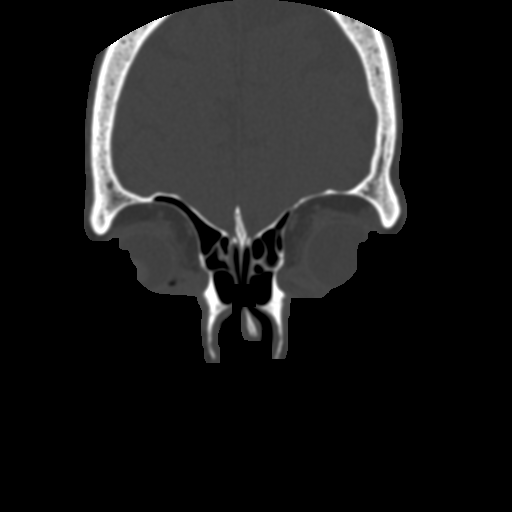
[im 9/11  bone]
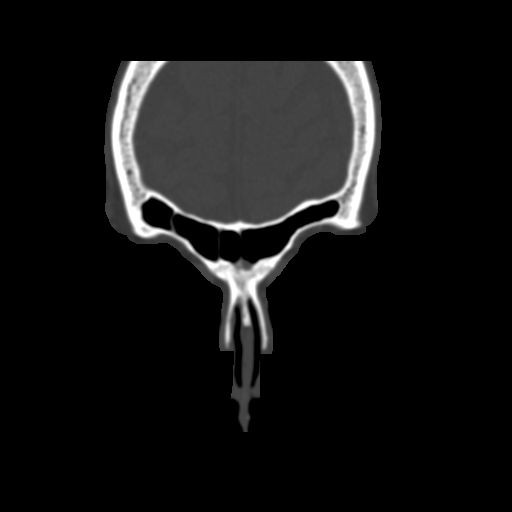
[im 10/11  brain]
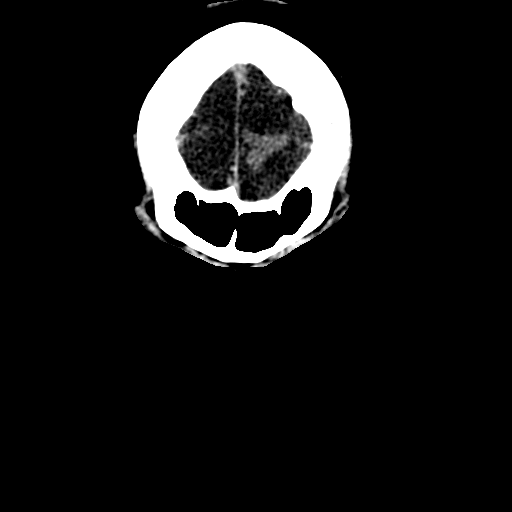
[im 10/11  bone]
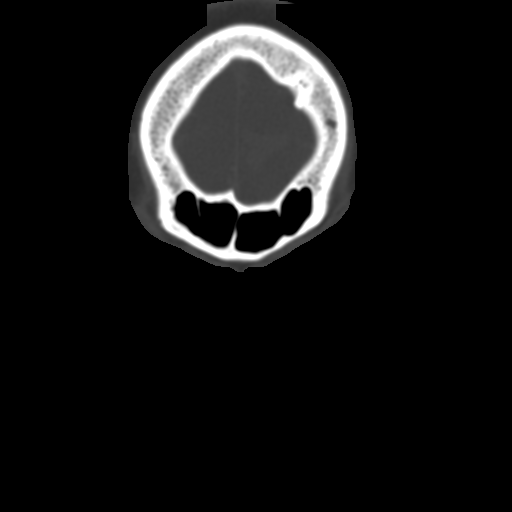

[9 of 11 positions shown; findings below may reference images not displayed]

FINDINGS: Negative visualized noncontrast brain parenchyma.
Visualized orbit soft tissues are within normal limits.  Negative
visualized face soft tissues.

The sphenoid sinuses are clear.
There is minimal ethmoid sinus mucosal thickening.  No ethmoids are
opacified.
The maxillary sinuses are clear.  Both ostiomeatal complexes appear
to be clear.
There is minimal mucosal thickening in the right frontal recess,
otherwise both frontal sinuses are clear.

Rightward nasal septal deviation.  Nasal cavity mucosa within
normal limits.

No acute osseous abnormality identified.
IMPRESSION: Negative paranasal sinuses.

## 2011-04-15 ENCOUNTER — Telehealth: Payer: Self-pay | Admitting: *Deleted

## 2011-04-15 NOTE — Telephone Encounter (Signed)
Discussed results with pt.  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  Advised him to call and we will help with referral if needed.

## 2011-04-15 NOTE — Telephone Encounter (Signed)
Message copied by Ocie Bob on Mon Apr 15, 2011  2:24 PM ------      Message from: Stefanie Libel      Created: Fri Apr 12, 2011  1:55 PM       Let Ruhaan know that his sinuses are not infected but the nasal deviation is fairly extreme - probably blocking the breathing

## 2011-05-21 ENCOUNTER — Other Ambulatory Visit: Payer: Self-pay | Admitting: Sports Medicine

## 2011-05-22 ENCOUNTER — Encounter: Payer: Self-pay | Admitting: Internal Medicine

## 2011-06-26 ENCOUNTER — Other Ambulatory Visit: Payer: Self-pay | Admitting: Sports Medicine

## 2011-07-04 ENCOUNTER — Telehealth: Payer: Self-pay | Admitting: *Deleted

## 2011-07-04 NOTE — Telephone Encounter (Signed)
Per Dr. Oneida Alar- scheduled pt an appt with Dr. Lucia Gaskins for chronic sinus issues.

## 2011-07-04 NOTE — Telephone Encounter (Signed)
Message copied by Ocie Bob on Thu Jul 04, 2011  5:25 PM ------      Message from: Carolyne Littles      Created: Tue Jul 02, 2011  4:18 PM      Regarding: phone message      Contact: [phone]       Pt would like a referral to see Dr. Lucia Gaskins for his sinuses.

## 2011-07-28 ENCOUNTER — Other Ambulatory Visit: Payer: Self-pay | Admitting: Sports Medicine

## 2011-08-03 ENCOUNTER — Other Ambulatory Visit: Payer: Self-pay | Admitting: Gastroenterology

## 2011-08-05 ENCOUNTER — Other Ambulatory Visit: Payer: Self-pay | Admitting: Gastroenterology

## 2011-08-05 ENCOUNTER — Telehealth: Payer: Self-pay | Admitting: *Deleted

## 2011-08-05 MED ORDER — MERCAPTOPURINE 50 MG PO TABS: ORAL_TABLET | ORAL | Status: DC

## 2011-08-05 MED ORDER — FOLIC ACID 1 MG PO TABS: ORAL_TABLET | ORAL | Status: DC

## 2011-08-05 MED ORDER — ESOMEPRAZOLE MAGNESIUM 40 MG PO CPDR: DELAYED_RELEASE_CAPSULE | ORAL | Status: DC

## 2011-08-05 NOTE — Telephone Encounter (Signed)
Pt reports he is having nasal surgery this month and will call when ok to schedule his COLON. Asked that I refill his Nexium, 6MP and Folic Acid until he can schedule this. Checked and Mearl Latin has ordered one month's worth of meds.

## 2011-08-05 NOTE — Telephone Encounter (Signed)
rxs sent pt must have office visit or colonoscopy before anymore refills.

## 2011-08-29 ENCOUNTER — Encounter: Payer: Self-pay | Admitting: Gastroenterology

## 2011-08-29 ENCOUNTER — Other Ambulatory Visit: Payer: Self-pay | Admitting: Gastroenterology

## 2011-08-29 MED ORDER — FOLIC ACID 1 MG PO TABS: ORAL_TABLET | ORAL | Status: DC

## 2011-08-29 MED ORDER — MERCAPTOPURINE 50 MG PO TABS: ORAL_TABLET | ORAL | Status: DC

## 2011-08-29 MED ORDER — ESOMEPRAZOLE MAGNESIUM 40 MG PO CPDR: DELAYED_RELEASE_CAPSULE | ORAL | Status: DC

## 2011-08-29 NOTE — Telephone Encounter (Signed)
rxs sent but pt MUST keep his colonoscopy appt or NO MORE refills will be sent no more exceptions.

## 2011-10-17 ENCOUNTER — Encounter: Payer: Self-pay | Admitting: Gastroenterology

## 2011-10-17 ENCOUNTER — Ambulatory Visit (AMBULATORY_SURGERY_CENTER): Payer: BC Managed Care – PPO | Admitting: *Deleted

## 2011-10-17 VITALS — Ht 66.5 in | Wt 193.3 lb

## 2011-10-17 DIAGNOSIS — K5010 Crohn's disease of large intestine without complications: Secondary | ICD-10-CM

## 2011-10-17 MED ORDER — MOVIPREP 100 G PO SOLR: ORAL | Status: DC

## 2011-11-01 ENCOUNTER — Encounter: Payer: Self-pay | Admitting: Gastroenterology

## 2011-11-01 ENCOUNTER — Other Ambulatory Visit: Payer: Self-pay | Admitting: *Deleted

## 2011-11-01 ENCOUNTER — Other Ambulatory Visit (INDEPENDENT_AMBULATORY_CARE_PROVIDER_SITE_OTHER): Payer: BC Managed Care – PPO

## 2011-11-01 ENCOUNTER — Ambulatory Visit (AMBULATORY_SURGERY_CENTER): Payer: BC Managed Care – PPO | Admitting: Gastroenterology

## 2011-11-01 VITALS — BP 132/82 | HR 59 | Temp 97.3°F | Resp 17 | Ht 66.5 in | Wt 193.0 lb

## 2011-11-01 DIAGNOSIS — D126 Benign neoplasm of colon, unspecified: Secondary | ICD-10-CM

## 2011-11-01 DIAGNOSIS — Z1211 Encounter for screening for malignant neoplasm of colon: Secondary | ICD-10-CM

## 2011-11-01 DIAGNOSIS — R7989 Other specified abnormal findings of blood chemistry: Secondary | ICD-10-CM

## 2011-11-01 DIAGNOSIS — K5010 Crohn's disease of large intestine without complications: Secondary | ICD-10-CM

## 2011-11-01 LAB — HEPATIC FUNCTION PANEL
ALT: 106 U/L — ABNORMAL HIGH (ref 0–53)
Alkaline Phosphatase: 68 U/L (ref 39–117)
Bilirubin, Direct: 0.2 mg/dL (ref 0.0–0.3)
Total Protein: 6.5 g/dL (ref 6.0–8.3)

## 2011-11-01 LAB — CBC WITH DIFFERENTIAL/PLATELET
Basophils Relative: 0.1 % (ref 0.0–3.0)
Eosinophils Absolute: 0 10*3/uL (ref 0.0–0.7)
Eosinophils Relative: 0.5 % (ref 0.0–5.0)
Lymphocytes Relative: 24.6 % (ref 12.0–46.0)
Neutrophils Relative %: 65 % (ref 43.0–77.0)
RBC: 4.01 Mil/uL — ABNORMAL LOW (ref 4.22–5.81)
WBC: 4.2 10*3/uL — ABNORMAL LOW (ref 4.5–10.5)

## 2011-11-01 LAB — HM COLONOSCOPY

## 2011-11-01 MED ORDER — SODIUM CHLORIDE 0.9 % IV SOLN: 500.0000 mL | INTRAVENOUS | Status: DC

## 2011-11-01 NOTE — Progress Notes (Addendum)
Dr. Sharlett Iles ordered CBC and Liver Profile to be done today before discharge.  Shade Flood RN made aware and will put the labs in the computer  Patient did not have preoperative order for IV antibiotic SSI prophylaxis. [phone]) Patient did not experience any of the following events: a burn prior to discharge; a fall within the facility; wrong site/side/patient/procedure/implant event; or a hospital transfer or hospital admission upon discharge from the facility. [phone])

## 2011-11-01 NOTE — Patient Instructions (Addendum)
YOU HAD AN ENDOSCOPIC PROCEDURE TODAY AT [facility]: Refer to the procedure report that was given to you for any specific questions about what was found during the examination.  If the procedure report does not answer your questions, please call your gastroenterologist to clarify.  If you requested that your care partner not be given the details of your procedure findings, then the procedure report has been included in a sealed envelope for you to review at your convenience later.  YOU SHOULD EXPECT: Some feelings of bloating in the abdomen. Passage of more gas than usual.  Walking can help get rid of the air that was put into your GI tract during the procedure and reduce the bloating. If you had a lower endoscopy (such as a colonoscopy or flexible sigmoidoscopy) you may notice spotting of blood in your stool or on the toilet paper. If you underwent a bowel prep for your procedure, then you may not have a normal bowel movement for a few days.  DIET: Your first meal following the procedure should be a light meal and then it is ok to progress to your normal diet.  A half-sandwich or bowl of soup is an example of a good first meal.  Heavy or fried foods are harder to digest and may make you feel nauseous or bloated.  Likewise meals heavy in dairy and vegetables can cause extra gas to form and this can also increase the bloating.  Drink plenty of fluids but you should avoid alcoholic beverages for 24 hours.  ACTIVITY: Your care partner should take you home directly after the procedure.  You should plan to take it easy, moving slowly for the rest of the day.  You can resume normal activity the day after the procedure however you should NOT DRIVE or use heavy machinery for 24 hours (because of the sedation medicines used during the test).    SYMPTOMS TO REPORT IMMEDIATELY: A gastroenterologist can be reached at any hour.  During normal business hours, 8:30 AM to 5:00 PM Monday through Friday,  call [phone].  After hours and on weekends, please call the GI answering service at [phone] who will take a message and have the physician on call contact you.   Following lower endoscopy (colonoscopy or flexible sigmoidoscopy):  Excessive amounts of blood in the stool  Significant tenderness or worsening of abdominal pains  Swelling of the abdomen that is new, acute  Fever of 100F or higher  FOLLOW UP: If any biopsies were taken you will be contacted by phone or by letter within the next 1-3 weeks.  Call your gastroenterologist if you have not heard about the biopsies in 3 weeks.  Our staff will call the home number listed on your records the next business day following your procedure to check on you and address any questions or concerns that you may have at that time regarding the information given to you following your procedure. This is a courtesy call and so if there is no answer at the home number and we have not heard from you through the emergency physician on call, we will assume that you have returned to your regular daily activities without incident.  SIGNATURES/CONFIDENTIALITY: You and/or your care partner have signed paperwork which will be entered into your electronic medical record.  These signatures attest to the fact that that the information above on your After Visit Summary has been reviewed and is understood.  Full responsibility of the confidentiality of this  discharge information lies with you and/or your care-partner.   Ok to resume your normal medications  Lab work today

## 2011-11-01 NOTE — Op Note (Signed)
[address]. [place],   [id number]  COLONOSCOPY PROCEDURE REPORT  PATIENT:  Darryl [name], Darryl [name]  MR#:  [id number] BIRTHDATE:  [birth year], 50 yrs. old  GENDER:  male ENDOSCOPIST:  Loralee Pacas. Sharlett Iles, MD, [facility] REF. BY: PROCEDURE DATE:  11/01/2011 PROCEDURE:  Colonoscopy with biopsy ASA CLASS:  Class II INDICATIONS:  chronic colitis.R/O DYSPLASIA MEDICATIONS:   propofol (Diprivan) 100 mg IV  DESCRIPTION OF PROCEDURE:   After the risks benefits and alternatives of the procedure were thoroughly explained, informed consent was obtained.  Digital rectal exam was performed and revealed no abnormalities.   The LB [id number] endoscope was introduced through the anus and advanced to the cecum, which was identified by both the appendix and ileocecal valve, without limitations.  The quality of the prep was excellent, using MoviPrep.  The instrument was then slowly withdrawn as the colon was fully examined. <<PROCEDUREIMAGES>>  FINDINGS:  There were mucosal changes consistent with Crohn's disease. SCARRED RIGHT COLON WITH MINOR ACTIVITY.BX. #1.  No polyps or cancers were seen. LEFT COLON BIOPSIED BUT LOOKS NORMAL Retroflexed views in the rectum revealed no abnormalities.    The time to cecum =  minutes. The scope was then withdrawn in  minutes from the cecum and the procedure completed. COMPLICATIONS:  None ENDOSCOPIC IMPRESSION: 1) Colitis - Crohn's 2) No polyps or cancers CHRONIC CROHN'S RIGHT SIDED COLITIS.R/O DYSPLASIA RECOMMENDATIONS: 1) Await pathology results 2) Continue Surveillance 3) Continue current medications REPEAT EXAM:  No  ______________________________ Loralee Pacas. Sharlett Iles, MD, [facility]  CC:  n. eSIGNED:   Loralee Pacas. Vasilisa Vore at 11/01/2011 09:03 AM  Philipp Ovens, [phone]

## 2011-11-04 ENCOUNTER — Telehealth: Payer: Self-pay | Admitting: *Deleted

## 2011-11-04 NOTE — Telephone Encounter (Signed)
  Follow up Call-  Call back number 11/01/2011  Post procedure Call Back phone  # [phone]  Permission to leave phone message Yes     Patient questions:  Left message to call us if necessary.

## 2011-11-05 ENCOUNTER — Telehealth: Payer: Self-pay | Admitting: *Deleted

## 2011-11-05 DIAGNOSIS — K5010 Crohn's disease of large intestine without complications: Secondary | ICD-10-CM

## 2011-11-05 NOTE — Telephone Encounter (Signed)
Informed pt of the need for the Thiopurine Metabolites test from [facility] and he will need to sign a waiver to pay for the test if insurance refuses; cost of the test is $270. Pt stated understanding and will come for the test probably tomorrow.

## 2011-11-06 ENCOUNTER — Other Ambulatory Visit: Payer: BC Managed Care – PPO

## 2011-11-06 ENCOUNTER — Other Ambulatory Visit: Payer: Self-pay | Admitting: Gastroenterology

## 2011-11-06 ENCOUNTER — Encounter: Payer: Self-pay | Admitting: Gastroenterology

## 2011-11-06 DIAGNOSIS — K5010 Crohn's disease of large intestine without complications: Secondary | ICD-10-CM

## 2011-11-07 LAB — HEMOCHROMATOSIS DNA-PCR(C282Y,H63D)

## 2011-11-15 ENCOUNTER — Telehealth: Payer: Self-pay | Admitting: *Deleted

## 2011-11-15 NOTE — Telephone Encounter (Signed)
Message copied by Lance Morin on Fri Nov 15, 2011  4:07 PM ------      Message from: PATTERSON, DAVID R      Created: Fri Nov 15, 2011 11:59 AM       I tried to call him, but could not reach him. Have him decrease his 6-MP to 75 mg a day. Also question him about whether or not he has a true sulfur allergy. I may need to add aminosalicylate stew his drugs to increase the 6-TGN levels. We will need to repeat his CBC and liver function tests in one month

## 2011-11-15 NOTE — Telephone Encounter (Signed)
Informed pt of Dr Buel Ream orders and if he remembers about his sulfa allergy. Pt states he doesn't remember exactly, but he thinks he just couldn't tolerate the sulfa drug Dr Sharlett Iles put him on. He states he was dx in the 102's and I can't find anything in EPIC. I have sent for the chart, but I don't know if it's here or off site. Pt really wants to discuss all this with Dr Sharlett Iles, so he will come in on 11/19/11 to discuss things.

## 2011-11-19 ENCOUNTER — Ambulatory Visit (INDEPENDENT_AMBULATORY_CARE_PROVIDER_SITE_OTHER): Payer: BC Managed Care – PPO | Admitting: Gastroenterology

## 2011-11-19 ENCOUNTER — Encounter: Payer: Self-pay | Admitting: Gastroenterology

## 2011-11-19 VITALS — BP 134/88 | HR 80 | Ht 64.5 in | Wt 195.5 lb

## 2011-11-19 DIAGNOSIS — J45909 Unspecified asthma, uncomplicated: Secondary | ICD-10-CM

## 2011-11-19 DIAGNOSIS — M1990 Unspecified osteoarthritis, unspecified site: Secondary | ICD-10-CM

## 2011-11-19 DIAGNOSIS — K5010 Crohn's disease of large intestine without complications: Secondary | ICD-10-CM

## 2011-11-19 MED ORDER — MESALAMINE 800 MG PO TBEC: 800.0000 mg | DELAYED_RELEASE_TABLET | Freq: Every day | ORAL | Status: DC

## 2011-11-19 NOTE — Patient Instructions (Addendum)
We have given you samples of Asacol Follow up in one month Before your appointment go to the lab We have changed your 6MP to take 100 mg and alternating with 75 mg.

## 2011-11-19 NOTE — Progress Notes (Signed)
This is a 61 year old Caucasian male chronic Crohn's colitis. Recent colonoscopy showed chronic colitis in the right colon but no evidence of dysplasia. Lab data shows mild increase in his serum transaminases, and [facility] labs showed enzyme patterns consistent with mild hepatic injury NSAID therapeutic active 6-MP metabolites. Patient is currently in remission but since decreasing his 6-MP from 10 to 75 mg a day, he has had some diarrhea and cramping. He also has degenerative arthritis and slight worsening of his arthralgias. He denies rectal bleeding or other systemic complaints. His asthmatic bronchitis is under good control with inhalers, he takes daily Nexium for GERD, and recent labs showed elevated iron levels, and he was a heterozygote for hemachromatosis. Patient has had Crohn's disease over 30 years without surgery.   Current Medications, Allergies, Past Medical History, Past Surgical History, Family History and Social History were reviewed in [employer] record.  Pertinent Review of Systems Negative   Physical Exam: Blood pressure 134/88, pulse 80 and regular, weight under 95 pounds the BMI of 33.04. I cannot appreciate stigmata of chronic liver disease. His chest actually is clear without wheezes or rhonchi. He appears to be in a regular rhythm without murmurs gallops or rubs. There is no hepatomegaly, nominal masses or tenderness. Bowel sounds are normal. There is no peripheral edema, phlebitis, skin rashes, or evidence of active arthritis. Mental status is normal.   Assessment and Plan: Chronic Crohn's colitis which has been in remission for a good period of time on immunosuppressant therapy. His slightly abnormal liver function test are of concern, and we will try to lower his dose of 6-MP to 100 mg a day alternating with 75 mg a day, add Asacol HD 800 mg a day, and repeat his CBC and liver profile in one month's time. We discussed possible transition to biologic   therapy depending on his inflammatory bowel disease activity and his tolerance to medications. Apparently years ago he could not tolerate Azulfidine, and he is to be on guard for any reaction to Asacol although I think this is unlikely. We will apply PPD skin testing also. No diagnosis found.

## 2011-11-20 ENCOUNTER — Encounter: Payer: Self-pay | Admitting: Gastroenterology

## 2011-11-20 ENCOUNTER — Telehealth: Payer: Self-pay | Admitting: *Deleted

## 2011-11-20 NOTE — Telephone Encounter (Signed)
Do you want him to f/u on 01/01/12 or bring him in earlier to start a biologic? Thanks.

## 2011-11-20 NOTE — Telephone Encounter (Signed)
Informed pt I listed Asacol as an allergy and sent Dr Sharlett Iles info; per Dr Sharlett Iles keep the October appt and call for earlier problems. Pt stated understanding.

## 2011-11-20 NOTE — Telephone Encounter (Signed)
Stop asacol

## 2011-11-20 NOTE — Telephone Encounter (Signed)
Lets see how he does clinically,,,needs PPD

## 2011-11-20 NOTE — Telephone Encounter (Signed)
Pt reports her took the 1st dose of Asacol yesterday around noon. He had trouble swallowing and keeping the pill down but managed. Later, he developed further trouble swallowing and had a loss of appetite; later he developed a rash. Pt remembers now this is what happened before when he tried the sulfa drugs. Pt states he has stopped the drug and everything is back to normal. He was told not to take the drug again and I would speak to Dr Sharlett Iles. He comes in next week for his TB test; would you like to move up his f/u appt? Thanks.

## 2011-11-26 ENCOUNTER — Ambulatory Visit (INDEPENDENT_AMBULATORY_CARE_PROVIDER_SITE_OTHER): Payer: BC Managed Care – PPO | Admitting: Gastroenterology

## 2011-11-26 DIAGNOSIS — K5090 Crohn's disease, unspecified, without complications: Secondary | ICD-10-CM

## 2011-11-28 ENCOUNTER — Other Ambulatory Visit (INDEPENDENT_AMBULATORY_CARE_PROVIDER_SITE_OTHER): Payer: BC Managed Care – PPO

## 2011-11-28 DIAGNOSIS — K5010 Crohn's disease of large intestine without complications: Secondary | ICD-10-CM

## 2011-11-28 LAB — TB SKIN TEST
Induration: 0 mm
TB Skin Test: NEGATIVE
TB Skin Test: NEGATIVE

## 2011-12-03 ENCOUNTER — Other Ambulatory Visit: Payer: Self-pay | Admitting: Gastroenterology

## 2011-12-19 ENCOUNTER — Other Ambulatory Visit: Payer: Self-pay | Admitting: Sports Medicine

## 2012-01-01 ENCOUNTER — Ambulatory Visit: Payer: BC Managed Care – PPO | Admitting: Gastroenterology

## 2012-01-20 ENCOUNTER — Other Ambulatory Visit: Payer: Self-pay | Admitting: Sports Medicine

## 2012-01-29 ENCOUNTER — Other Ambulatory Visit: Payer: Self-pay | Admitting: Gastroenterology

## 2012-02-04 ENCOUNTER — Telehealth: Payer: Self-pay | Admitting: Gastroenterology

## 2012-02-04 MED ORDER — ESOMEPRAZOLE MAGNESIUM 40 MG PO CPDR: 40.0000 mg | DELAYED_RELEASE_CAPSULE | Freq: Every day | ORAL | Status: DC

## 2012-02-04 NOTE — Telephone Encounter (Signed)
Called in refills for pt's Nexium and scheduled hi for an appt on 02/13/12 to discuss 6MP vs Humira; pt stated understanding.

## 2012-02-05 ENCOUNTER — Encounter: Payer: Self-pay | Admitting: *Deleted

## 2012-02-09 ENCOUNTER — Other Ambulatory Visit: Payer: Self-pay | Admitting: Sports Medicine

## 2012-02-13 ENCOUNTER — Ambulatory Visit (INDEPENDENT_AMBULATORY_CARE_PROVIDER_SITE_OTHER): Payer: BC Managed Care – PPO | Admitting: Gastroenterology

## 2012-02-13 ENCOUNTER — Other Ambulatory Visit (INDEPENDENT_AMBULATORY_CARE_PROVIDER_SITE_OTHER): Payer: BC Managed Care – PPO

## 2012-02-13 ENCOUNTER — Encounter: Payer: Self-pay | Admitting: Gastroenterology

## 2012-02-13 VITALS — BP 140/90 | HR 64 | Ht 66.0 in | Wt 196.4 lb

## 2012-02-13 DIAGNOSIS — K5090 Crohn's disease, unspecified, without complications: Secondary | ICD-10-CM

## 2012-02-13 LAB — CBC WITH DIFFERENTIAL/PLATELET
Basophils Relative: 0.3 % (ref 0.0–3.0)
Eosinophils Relative: 0.1 % (ref 0.0–5.0)
HCT: 44.3 % (ref 39.0–52.0)
Hemoglobin: 14.7 g/dL (ref 13.0–17.0)
Lymphs Abs: 1.7 10*3/uL (ref 0.7–4.0)
MCV: 104.5 fl — ABNORMAL HIGH (ref 78.0–100.0)
Monocytes Absolute: 0.7 10*3/uL (ref 0.1–1.0)
Monocytes Relative: 10.4 % (ref 3.0–12.0)
Neutro Abs: 4.3 10*3/uL (ref 1.4–7.7)
Platelets: 268 10*3/uL (ref 150.0–400.0)
RBC: 4.23 Mil/uL (ref 4.22–5.81)
WBC: 6.7 10*3/uL (ref 4.5–10.5)

## 2012-02-13 LAB — HEPATIC FUNCTION PANEL
ALT: 59 U/L — ABNORMAL HIGH (ref 0–53)
AST: 35 U/L (ref 0–37)
Albumin: 3.9 g/dL (ref 3.5–5.2)
Alkaline Phosphatase: 73 U/L (ref 39–117)
Total Bilirubin: 0.6 mg/dL (ref 0.3–1.2)

## 2012-02-13 MED ORDER — FOLIC ACID 1 MG PO TABS: 1.0000 mg | ORAL_TABLET | Freq: Every day | ORAL | Status: DC

## 2012-02-13 MED ORDER — MERCAPTOPURINE 50 MG PO TABS: ORAL_TABLET | ORAL | Status: DC

## 2012-02-13 NOTE — Patient Instructions (Addendum)
Your physician has requested that you go to the basement for lab work before leaving today  We have sent the following medications to your pharmacy for you to pick up at your convenience: Mercaptopurine AND Folic Acid. Please take as directed   Please make a follow up visit in one year

## 2012-02-13 NOTE — Progress Notes (Signed)
History of Present Illness: This is a 61 year old Caucasian male who has had Crohn's colitis for over 20 years.  Recent colonoscopy showed minimal activity and dysplasia biopsies were negative.  His liver function tests have been slightly elevated, and his 6-MP metabolite levels were elevated, so we have made adjustments in his 6-MP dosage to 75 mg a day alternating with 100 mg every other day.  He currently is completely asymptomatic.  He has regular bowel movements without melena, hematochezia, or abdominal pain.  He does suffer from degenerative arthritis, especially in his right hip, and he has been running for exercise for many years.   Current Medications, Allergies, Past Medical History, Past Surgical History, Family History and Social History were reviewed in [employer] record.   Assessment and plan: Repeat CBC and liver profile and make decision about switching to Humira in Pl. of 6-MP... he would of course need chest x-ray and PPD skin testing before initiating biological therapy.  Patient also is on daily Nexium 40 mg and folic acid.  Has a history of mesalamine allergy. Encounter Diagnosis  Name Primary?  . Crohn's disease Yes

## 2012-04-02 ENCOUNTER — Telehealth: Payer: Self-pay | Admitting: Gastroenterology

## 2012-04-02 NOTE — Telephone Encounter (Signed)
I spoke with Darryl [name] at [employer] and Nexium was approved.  Case number is [id number] and Nexium approved til 03-2013.  Patient and pharmacy notified.  Fax confirmation will come as well.

## 2012-04-02 NOTE — Telephone Encounter (Signed)
I called [facility] [id number] for prior authorization for Nexium.

## 2012-04-12 ENCOUNTER — Other Ambulatory Visit: Payer: Self-pay | Admitting: Sports Medicine

## 2012-04-13 NOTE — Telephone Encounter (Signed)
Asked pt to schedule a f/u appt with Dr. Oneida Alar.

## 2012-05-12 ENCOUNTER — Ambulatory Visit (INDEPENDENT_AMBULATORY_CARE_PROVIDER_SITE_OTHER): Payer: BC Managed Care – PPO | Admitting: Sports Medicine

## 2012-05-12 ENCOUNTER — Other Ambulatory Visit: Payer: Self-pay | Admitting: Gastroenterology

## 2012-05-12 VITALS — BP 126/85 | Ht 66.0 in | Wt 185.0 lb

## 2012-05-12 DIAGNOSIS — M76899 Other specified enthesopathies of unspecified lower limb, excluding foot: Secondary | ICD-10-CM

## 2012-05-12 DIAGNOSIS — J45909 Unspecified asthma, uncomplicated: Secondary | ICD-10-CM

## 2012-05-12 DIAGNOSIS — M25519 Pain in unspecified shoulder: Secondary | ICD-10-CM

## 2012-05-12 DIAGNOSIS — M7061 Trochanteric bursitis, right hip: Secondary | ICD-10-CM | POA: Insufficient documentation

## 2012-05-12 MED ORDER — FLUTICASONE PROPIONATE 50 MCG/ACT NA SUSP: 1.0000 | Freq: Every day | NASAL | Status: DC

## 2012-05-12 MED ORDER — FLUTICASONE-SALMETEROL 115-21 MCG/ACT IN AERO: INHALATION_SPRAY | RESPIRATORY_TRACT | Status: DC

## 2012-05-12 MED ORDER — MONTELUKAST SODIUM 10 MG PO TABS: ORAL_TABLET | ORAL | Status: DC

## 2012-05-12 MED ORDER — ALBUTEROL SULFATE HFA 108 (90 BASE) MCG/ACT IN AERS: 2.0000 | INHALATION_SPRAY | Freq: Four times a day (QID) | RESPIRATORY_TRACT | Status: DC | PRN

## 2012-05-12 NOTE — Progress Notes (Signed)
62 yo M here for med refill for his asthma medicines.  No recent flares.  Uses his albuterol every day as part of his "routine"- has not needed it as a rescue inhaler.  Typically uses 2 puffs before using AM Advair dose.  Using Singulair daily and uses Flonase most days.   Crohns has been well controlled- was able to decrease 6-MP recently per GI.  R hip pain- usually hurts R lateral hip after finishing exercise on the treadmill.  Is mostly concerned because it bothers him after walking for long distances and he has a trip to [place] planned for September and wants to be able to walk.  L rotator cuff- concerned that he tore this; a few months ago, he was placing a basketball hop and the backboard started to fall so he grabbed it overhead with his left arm, has been having pain when moving his left arm above his head since that time.  Is no longer able to sleep on L side at night.  PE: Gen: NAD, pleasant, overweight Pulm: clear bilaterally without wheezes Hip: full ROM bilaterally (5 degree decrease L compared to R), +tenderness to palpation over RT greater trochanteric bursa  L shoulder: full ROM but with some pain when overhead; + empty can test, mildly + Hawkins  Plan: Refilled asthma meds x1 year F/u for U/S of L shoulder and R hip (pt at increased risk for OA/bone issue in hip due to prolonged use of anti-inflammatories for Crohns dx)

## 2012-05-12 NOTE — Assessment & Plan Note (Signed)
This is very stable and we made no changes in his maintenance regimen  Darryl [name] taking the medications he has been able to exercise regularly without significant symptoms

## 2012-05-12 NOTE — Assessment & Plan Note (Signed)
He is returning for further evaluation of his shoulder with examination and ultrasound and we'll make a decision about home exercises versus other therapy based on that visit

## 2012-05-12 NOTE — Assessment & Plan Note (Signed)
Given a series of hip exercise  If his symptoms do not lessen we will inject this on his return to clinic after we scan the greater trochanteric bursa

## 2012-05-20 ENCOUNTER — Encounter: Payer: Self-pay | Admitting: Family Medicine

## 2012-05-20 ENCOUNTER — Ambulatory Visit (INDEPENDENT_AMBULATORY_CARE_PROVIDER_SITE_OTHER): Payer: BC Managed Care – PPO | Admitting: Family Medicine

## 2012-05-20 VITALS — BP 131/87 | HR 63 | Ht 66.0 in | Wt 185.0 lb

## 2012-05-20 DIAGNOSIS — M7061 Trochanteric bursitis, right hip: Secondary | ICD-10-CM

## 2012-05-20 NOTE — Progress Notes (Signed)
  Subjective:    Patient ID: Darryl [name], male    DOB: 1950-08-01, 62 y.o.   MRN: [id number]  HPI # Left shoulder pain He was on a ladder fixing a basketball hoop when one of the bolts loosened. He was able to lift his arms up and catch it, but his shoulder started hurting immediately afterwards and continues to bother him especially when he lays on that side at nighttime.  Pain in lateral upper arm. Pain with overhand motion and reaching back. No therapy or medications tried yet.   # Right hip pain This pain bothers him more than above. Pain is located in the lateral aspect of his hip.  It is exacerbated by walking. He was given hip strengthening exercise to do at his visit on 02/18 and reports he has been doing them.  At that visit he was diagnosed with trochanteric bursitis.   Review of Systems  Allergies, medication, past medical history reviewed.  Smoking status noted.  Problem list significant for: Asthma Crohn's disease--on mercaptupurine     Objective:   Physical Exam GEN: NAD; overweight LEFT SHOULDER:  Normal-appearing Nontender   ROM intact, comparable to right shoulder  No significant pain with internal or external rotation   Positive empty can test, positive Hawkins and Neer test   Strength intact   No AC joint tenderness   No tenderness in bicipital groove RIGHT HIP:    Tender over greater trochanter.    Limited musculoskeletal Ultrasound 1. LEFT SHOULDER: Biceps tendon: Normal-appearing intact in the groove on long and short views Subscapularis: Irregular appearing linear area of hypoechoic change consistent with tear. This is partial width no significant gap seen on dynamic views Supraspinatus: Normal appearing on long and short views Infraspinatus: Normal appearing on long and short views A.c. Joint: Normal-appearing  2. RIGHT lateral HIP: ~1 cm trochanteric bursa     Assessment & Plan:

## 2012-05-20 NOTE — Assessment & Plan Note (Signed)
Presentation and ultrasound consistent with right greater trochanter bursitis. Continue hip strengthening exercises. Consider glucocorticoid injection at 1 month follow-up if pain still significant.

## 2012-05-20 NOTE — Patient Instructions (Addendum)
Thank you for coming in today. 1) I think you have some small tearing of one of the rotator cuff tendons.  It is the subscapularis.  Do the exercises.  Come back in 1 month.   2) Hip: You have some bursitis. We can do an injection if it is bad enough.  Do the exercises we talked about. Come back in 1 month.

## 2012-05-20 NOTE — Assessment & Plan Note (Signed)
He may have partially torn subscapularis in left shoulder.  We will try shoulder strengthening exercise for now.  Follow-up in 1 month; consider starting NTG at that time if not showing expected improvement.

## 2012-06-18 ENCOUNTER — Ambulatory Visit: Payer: BC Managed Care – PPO | Admitting: Sports Medicine

## 2012-07-01 LAB — HM DIABETES EYE EXAM

## 2012-07-14 ENCOUNTER — Telehealth: Payer: Self-pay | Admitting: *Deleted

## 2012-07-14 MED ORDER — DICLOFENAC SODIUM 1 % TD GEL: 2.0000 g | Freq: Four times a day (QID) | TRANSDERMAL | Status: DC | PRN

## 2012-07-14 NOTE — Telephone Encounter (Signed)
rx sent per Dr. Oneida Alar.

## 2012-07-14 NOTE — Telephone Encounter (Signed)
Message copied by Ocie Bob on Tue Jul 14, 2012  5:01 PM ------      Message from: Babette Relic      Created: Tue Jul 14, 2012 11:39 AM      Regarding: Voltaren Gel      Contact: [phone]       Has pulled muscle in right arm ziplining.  Would like to know if he could get another prescription for Voltaren Gel.  [facility] [address]. ------

## 2012-07-22 ENCOUNTER — Ambulatory Visit: Payer: BC Managed Care – PPO | Admitting: Sports Medicine

## 2012-08-09 ENCOUNTER — Other Ambulatory Visit: Payer: Self-pay | Admitting: Gastroenterology

## 2012-10-19 ENCOUNTER — Other Ambulatory Visit: Payer: Self-pay | Admitting: Gastroenterology

## 2012-11-05 ENCOUNTER — Other Ambulatory Visit: Payer: Self-pay | Admitting: Gastroenterology

## 2012-11-26 ENCOUNTER — Telehealth: Payer: Self-pay | Admitting: *Deleted

## 2012-11-26 MED ORDER — ZOLPIDEM TARTRATE 10 MG PO TABS: 10.0000 mg | ORAL_TABLET | Freq: Every evening | ORAL | Status: DC | PRN

## 2012-11-26 NOTE — Telephone Encounter (Signed)
Message copied by Ocie Bob on Thu Nov 26, 2012 12:08 PM ------      Message from: Stefanie Libel      Created: Thu Nov 26, 2012 11:13 AM       Please send him 35 Ambien with NR  - [facility] ------

## 2012-12-09 ENCOUNTER — Encounter: Payer: Self-pay | Admitting: Family Medicine

## 2012-12-09 ENCOUNTER — Ambulatory Visit (INDEPENDENT_AMBULATORY_CARE_PROVIDER_SITE_OTHER): Payer: BC Managed Care – PPO | Admitting: Family Medicine

## 2012-12-09 VITALS — BP 132/91 | HR 70 | Ht 66.0 in | Wt 185.0 lb

## 2012-12-09 DIAGNOSIS — J329 Chronic sinusitis, unspecified: Secondary | ICD-10-CM

## 2012-12-09 MED ORDER — MOXIFLOXACIN HCL 400 MG PO TABS: 400.0000 mg | ORAL_TABLET | Freq: Every day | ORAL | Status: DC

## 2012-12-09 NOTE — Patient Instructions (Addendum)
Continue using your fluticasone nasal spray. Consider an oral anti-allergy medicine like claritin or zyrtec 63m at bedtime. Fill the avelox if symptoms of congestion worsen over next few days or you develop a fever over 100.4. See the attached handout for the exercises you would do for vertigo. Sudafed may help with congestion. Afrin nasal spray also helps but do NOT use for more than 3 days (absolutely not more than 5 days). Follow up as needed.

## 2012-12-11 ENCOUNTER — Encounter: Payer: Self-pay | Admitting: Family Medicine

## 2012-12-11 DIAGNOSIS — J329 Chronic sinusitis, unspecified: Secondary | ICD-10-CM | POA: Insufficient documentation

## 2012-12-11 NOTE — Progress Notes (Signed)
Patient ID: Darryl [name], male   DOB: [birth year], 62 y.o.   MRN: [id number]  PCP: Stefanie Libel, MD  Subjective:   HPI: Patient is a 62 y.o. male here for sinus pressure.  Patient states Sunday morning when he woke up he felt dizzy. Has happened before and was associated with sinus pressure that has worsened since that time. Describes room spinning sensation. Head also feels congested. Tried advil cold and sinus, advil congestion. Leaving Tuesday for [place]. No fevers, chills, sweats. No cough, chest pain, shortness of breath. Ears feel a little stopped up. No sore throat, eye drainage. No other complaints  Past Medical History  Diagnosis Date  . GERD (gastroesophageal reflux disease)   . Vitamin B12 deficiency   . Lateral epicondylitis  of elbow   . Exostosis of unspecified site   . Nontraumatic rupture of patellar tendon   . Acute upper respiratory infections of unspecified site   . Dizziness and giddiness   . Allergic rhinitis, cause unspecified   . Regional enteritis of unspecified site   . Obesity, unspecified   . Asthma   . Hyperplastic colon polyp 02/26/2008  . Crohn's colitis     Current Outpatient Prescriptions on File Prior to Visit  Medication Sig Dispense Refill  . albuterol (VENTOLIN HFA) 108 (90 BASE) MCG/ACT inhaler Inhale 2 puffs into the lungs every 6 (six) hours as needed for wheezing.  8.5 g  5  . diclofenac sodium (VOLTAREN) 1 % GEL Apply 2 g topically 4 (four) times daily as needed.  1 Tube  prn  . esomeprazole (NEXIUM) 40 MG capsule Take 1 capsule (40 mg total) by mouth daily before breakfast.  5 capsule  0  . fluticasone (FLONASE) 50 MCG/ACT nasal spray Place 1 spray into the nose daily.  16 g  6  . fluticasone-salmeterol (ADVAIR HFA) 115-21 MCG/ACT inhaler INHALE 1 PUFF AS DIRECTED TWICE A DAY AS NEEDED  8 g  11  . folic acid (FOLVITE) 1 MG tablet TAKE 1 TABLET (1 MG TOTAL) BY MOUTH DAILY.  30 tablet  2  . mercaptopurine (PURINETHOL) 50 MG tablet  TAKE 1 AND 1/2 TABLETS EVERY OTHER DAY.ALTERNATE WITH 2 TABLETS EVERY OTHER DAY  90 tablet  4  . montelukast (SINGULAIR) 10 MG tablet TAKE 1 TABLET BY MOUTH AT BEDTIME  30 tablet  11  . zolpidem (AMBIEN) 10 MG tablet Take 1 tablet (10 mg total) by mouth at bedtime as needed.  15 tablet  0   No current facility-administered medications on file prior to visit.    Past Surgical History  Procedure Laterality Date  . Tonsillectomy and adenoidectomy      Allergies  Allergen Reactions  . Asacol [Mesalamine] Hives    Trouble swallowing, loss of appetite  . Amoxicillin Rash  . Sulfamethoxazole Rash    History   Social History  . Marital Status: Married    Spouse Name: N/A    Number of Children: N/A  . Years of Education: N/A   Occupational History  . accountant    Social History Main Topics  . Smoking status: Never Smoker   . Smokeless tobacco: Never Used  . Alcohol Use: Yes     Comment: rare  . Drug Use: No  . Sexual Activity: Not on file   Other Topics Concern  . Not on file   Social History Narrative  . No narrative on file    Family History  Problem Relation Age of Onset  . Diabetes  Brother   . Diabetes Father   . Heart attack Father   . Hypertension Father   . Colon cancer Neg Hx   . Stomach cancer Neg Hx   . Hyperlipidemia Neg Hx   . Sudden death Neg Hx     BP 132/91  Pulse 70  Ht 5' 6"  (1.676 m)  Wt 185 lb (83.915 kg)  BMI 29.87 kg/m2  Review of Systems: See HPI above.    Objective:  Physical Exam:  Gen: NAD  HEENT: NCAT EOMI PERRL.  No injection. No nasal discharge.  Very mild frontal sinus tenderness. TMs bulging with fluid.  No erythema, exudate. Pharynx normal. Neck: No LAN Lungs CTAB, normal work of breathing. CV: RRR no MRG    Assessment & Plan:  1. Sinus pressure - at this point exam benign, consistent with allergies though could be developing a sinusitis.  Continue fluticasone, add oral antihistamine.  Fill avelox if symptoms  worsen, last more than 7 days, or develops a fever.  Sudafed, afrin discussed.  Given handout for vertigo exercises - did not tolerate meclizine in the past.  Has an ENT physician as well though did not go through with septum surgery.  F/u prn.

## 2012-12-11 NOTE — Assessment & Plan Note (Signed)
at this point exam benign, consistent with allergies though could be developing a sinusitis.  Continue fluticasone, add oral antihistamine.  Fill avelox if symptoms worsen, last more than 7 days, or develops a fever.  Sudafed, afrin discussed.  Given handout for vertigo exercises - did not tolerate meclizine in the past.  Has an ENT physician as well though did not go through with septum surgery.  F/u prn.

## 2013-01-08 ENCOUNTER — Other Ambulatory Visit: Payer: Self-pay | Admitting: Gastroenterology

## 2013-01-26 ENCOUNTER — Telehealth: Payer: Self-pay | Admitting: Gastroenterology

## 2013-01-26 NOTE — Telephone Encounter (Signed)
I called patient back and patient said that nexium is to expensive Patient said that he wants to try nexium over the counter, the 20 mg and take twice daily to equal 40 mg I advised patient that he can do that and after a couple of weeks if its not helping then call us back Patient stated that he knows he is due for office visit follow up so he will call back and schedule that closer to that time Patient will call back to let us know if nexium OTC is working

## 2013-01-30 ENCOUNTER — Other Ambulatory Visit: Payer: Self-pay | Admitting: Gastroenterology

## 2013-02-05 ENCOUNTER — Encounter: Payer: Self-pay | Admitting: Gastroenterology

## 2013-02-05 ENCOUNTER — Ambulatory Visit (INDEPENDENT_AMBULATORY_CARE_PROVIDER_SITE_OTHER): Payer: BC Managed Care – PPO | Admitting: Gastroenterology

## 2013-02-05 ENCOUNTER — Other Ambulatory Visit (INDEPENDENT_AMBULATORY_CARE_PROVIDER_SITE_OTHER): Payer: BC Managed Care – PPO

## 2013-02-05 VITALS — BP 146/90 | HR 80 | Ht 66.0 in | Wt 186.2 lb

## 2013-02-05 DIAGNOSIS — K219 Gastro-esophageal reflux disease without esophagitis: Secondary | ICD-10-CM

## 2013-02-05 DIAGNOSIS — K5010 Crohn's disease of large intestine without complications: Secondary | ICD-10-CM

## 2013-02-05 DIAGNOSIS — Z9225 Personal history of immunosupression therapy: Secondary | ICD-10-CM

## 2013-02-05 LAB — CBC WITH DIFFERENTIAL/PLATELET
Basophils Absolute: 0 10*3/uL (ref 0.0–0.1)
Basophils Relative: 0.7 % (ref 0.0–3.0)
Eosinophils Absolute: 0 10*3/uL (ref 0.0–0.7)
Eosinophils Relative: 0.3 % (ref 0.0–5.0)
HCT: 43.9 % (ref 39.0–52.0)
Hemoglobin: 15.2 g/dL (ref 13.0–17.0)
Lymphocytes Relative: 24.7 % (ref 12.0–46.0)
MCHC: 34.5 g/dL (ref 30.0–36.0)
MCV: 101.4 fl — ABNORMAL HIGH (ref 78.0–100.0)
Monocytes Absolute: 0.5 10*3/uL (ref 0.1–1.0)
Neutro Abs: 4 10*3/uL (ref 1.4–7.7)
Neutrophils Relative %: 65.6 % (ref 43.0–77.0)
RBC: 4.33 Mil/uL (ref 4.22–5.81)
RDW: 13.5 % (ref 11.5–14.6)

## 2013-02-05 LAB — HEPATIC FUNCTION PANEL
ALT: 38 U/L (ref 0–53)
AST: 25 U/L (ref 0–37)
Alkaline Phosphatase: 65 U/L (ref 39–117)
Total Bilirubin: 0.8 mg/dL (ref 0.3–1.2)
Total Protein: 6.7 g/dL (ref 6.0–8.3)

## 2013-02-05 LAB — BASIC METABOLIC PANEL
CO2: 29 mEq/L (ref 19–32)
Chloride: 105 mEq/L (ref 96–112)
Creatinine, Ser: 1 mg/dL (ref 0.4–1.5)
GFR: 80.07 mL/min (ref >60.00)
Sodium: 136 mEq/L (ref 135–145)

## 2013-02-05 LAB — FOLATE: Folate: >24.8 ng/mL (ref >5.9)

## 2013-02-05 LAB — IBC PANEL
Iron: 106 ug/dL (ref 42–165)
Saturation Ratios: 34.6 % (ref 20.0–50.0)

## 2013-02-05 LAB — VITAMIN B12: Vitamin B-12: 296 pg/mL (ref 211–911)

## 2013-02-05 LAB — TSH: TSH: 0.69 u[IU]/mL (ref 0.35–5.50)

## 2013-02-05 LAB — FERRITIN: Ferritin: 196.2 ng/mL (ref 22.0–322.0)

## 2013-02-05 LAB — SEDIMENTATION RATE: Sed Rate: 9 mm/hr (ref 0–22)

## 2013-02-05 MED ORDER — MERCAPTOPURINE 50 MG PO TABS: ORAL_TABLET | ORAL | Status: DC

## 2013-02-05 MED ORDER — FOLIC ACID 1 MG PO TABS: 1.0000 mg | ORAL_TABLET | Freq: Every day | ORAL | Status: DC

## 2013-02-05 NOTE — Progress Notes (Signed)
This is a very pleasant 62 year old Caucasian male with chronic Crohn's colitis over 20 years. He is been on 6-MP at various doses. Have to discontinue this medication 2 years ago resulted in a relapse of his disease, and he now is on 75 mg of 6-MP alternating with 100 mg every other day, and is in remission without cramping, diarrhea, rectal bleeding or any systemic complaints. Lab data has been fairly consistently normal but he does have slightly increased ALT,and he's had some elevated 6-MP metabolites last August with 6 TGN level of 135 and 6 MMPN elevated to 9011. He does have chronic asthma, and needs a primary care physician. He has chronic GERD and is currently switching from prescription Nexium to over-the-counter Nexium 20-40 mg a day. He denies dysphagia or any hepatobiliary complaints. His appetite is good and his weight is stable. He has retired from his job. His had no systemic complaints of fever, chills, et Ronney Asters. We have discussed in the past possible use of biological agents, but I do not think that is indicated at this time. Since I am retiring, I will be turning this patient over to Dr. Zenovia Jarred. I also will try to give him a primary care physician in our internal medicine office.  Current Medications, Allergies, Past Medical History, Past Surgical History, Family History and Social History were reviewed in [employer] record.  ROS: All systems were reviewed and are negative unless otherwise stated in the HPI.          Physical Exam: Somewhat obese patient in no acute distress. Blood pressure 146/90 pulse 80 and regular and weight 188 the BMI of 30.07. I cannot appreciate stigmata of chronic liver disease. His chest shows diminished breath sounds in both lung fields but no wheezes or rhonchi. He is in a regular rhythm without murmurs gallops or rubs. His abdomen shows mild obesity but no organomegaly, masses or tenderness. Extremities are unremarkable and  mental status is normal.    Assessment and Plan: Crohn's colitis in remission on immunosuppressants therapy. I have repeated his blood work and also 6-MP metabolite levels. As mentioned above, he has had previous allergic reaction to amino salicylates, so this is not a therapeutic alternative in this case. We will adjust hir 6-MP dosage as needed. I've scheduled him to see Dr. Hilarie Fredrickson 4 months time for followup. At that time he also will report how  is doing on his OTC Nexium therapy. Patient had colonoscopy one year ago with dysplasia screening, and there was no biopsy evidence of dysplasia. He is probably due for followup colonoscopy next year because of the longevity of his chronic colitis.

## 2013-02-05 NOTE — Patient Instructions (Signed)
Please follow up in four months with Dr. Hilarie Fredrickson   Please make an appointment with [facility], their phone number [phone])  Your physician has requested that you go to the basement for the following lab work before leaving today: BMP CBC TSH Liver Function Panel Sedimentation Rate  Anemia Panel  Refill on your Folic Acid and Mercaptopurine was sent to your pharmacy   You can purchase Nexium over the counter  We gave you a flu shot today

## 2013-02-16 LAB — THIOPURINE METABOLITES: 6-TGN Metabolite: 144 pmol/8x 10E8

## 2013-02-17 ENCOUNTER — Telehealth: Payer: Self-pay | Admitting: *Deleted

## 2013-02-17 DIAGNOSIS — K5090 Crohn's disease, unspecified, without complications: Secondary | ICD-10-CM

## 2013-02-17 NOTE — Telephone Encounter (Signed)
Message copied by Lance Morin on Wed Feb 17, 2013 12:59 PM ------      Message from: PATTERSON, DAVID R      Created: Tue Feb 16, 2013  1:14 PM       Include 6-MP 100 mg a day dosing... CBC in 2 weeks ------

## 2013-02-17 NOTE — Telephone Encounter (Signed)
Informed pt to increase 6MP to 1101m/day. I will call him in 2 weeks to check a CBC; pt stated understanding.

## 2013-03-03 ENCOUNTER — Telehealth: Payer: Self-pay | Admitting: *Deleted

## 2013-03-03 NOTE — Telephone Encounter (Signed)
Called pt to have him come for labs. Pt states he did not increase the 6MP because he is doing well and his Crohn's is under control. He alternates between 132m one day and 75 mg of 6MP the next. You had asked for an increase to 1040mdaily after CBC on 09/05/12. Please advise.

## 2013-03-03 NOTE — Telephone Encounter (Signed)
Informed pt we will call him every 6 months for a CBC and liver profile and stay on his current dose. Pt stated understanding.

## 2013-03-03 NOTE — Telephone Encounter (Signed)
Message copied by Lance Morin on Wed Mar 03, 2013  9:32 AM ------      Message from: Lance Morin      Created: Wed Feb 17, 2013  1:02 PM       Call pt to get CBC after increase in 6MP. ------

## 2013-03-03 NOTE — Telephone Encounter (Signed)
Needs every 6 mos CBC and liver profile

## 2013-04-15 ENCOUNTER — Telehealth: Payer: Self-pay | Admitting: Gastroenterology

## 2013-04-15 NOTE — Telephone Encounter (Signed)
I called patient back. Patient said he just wanted to know if he should see a primary care doctor first or a pulmonary specialist. I advised patient that I would start with a primary care doctor and then if he needs to be referred to a specialist then primary care will do that for him. Patient verbalized understanding.

## 2013-04-28 ENCOUNTER — Telehealth: Payer: Self-pay | Admitting: Gastroenterology

## 2013-04-28 ENCOUNTER — Telehealth: Payer: Self-pay | Admitting: Internal Medicine

## 2013-04-28 NOTE — Telephone Encounter (Signed)
Pt request to be new pt, pt was referred from Dr. Sharlett Iles due to his retirement in March. Pt has [employer] for insurance. Please advise.

## 2013-04-28 NOTE — Telephone Encounter (Signed)
Pt has an appt 2.23.15.

## 2013-04-28 NOTE — Telephone Encounter (Signed)
ok 

## 2013-04-28 NOTE — Telephone Encounter (Signed)
I called primary care downstairs--Dr. Ronnald Ramp office and they only have new patient openings for the PA downstairs. I called patient back, patient does not want to see a PA. I reccommended to patient to go on [facility] website and look over the doctors there, then call to see if any of them are accepting new patients. Patient verbalized understanding

## 2013-05-17 ENCOUNTER — Ambulatory Visit: Payer: BC Managed Care – PPO | Admitting: Internal Medicine

## 2013-05-19 ENCOUNTER — Encounter: Payer: Self-pay | Admitting: Internal Medicine

## 2013-05-19 ENCOUNTER — Other Ambulatory Visit (INDEPENDENT_AMBULATORY_CARE_PROVIDER_SITE_OTHER): Payer: BC Managed Care – PPO

## 2013-05-19 ENCOUNTER — Ambulatory Visit (INDEPENDENT_AMBULATORY_CARE_PROVIDER_SITE_OTHER): Payer: BC Managed Care – PPO | Admitting: Internal Medicine

## 2013-05-19 VITALS — BP 138/70 | HR 98 | Temp 97.8°F | Resp 16 | Wt 192.0 lb

## 2013-05-19 DIAGNOSIS — Z0000 Encounter for general adult medical examination without abnormal findings: Secondary | ICD-10-CM

## 2013-05-19 DIAGNOSIS — Z136 Encounter for screening for cardiovascular disorders: Secondary | ICD-10-CM | POA: Insufficient documentation

## 2013-05-19 DIAGNOSIS — Z23 Encounter for immunization: Secondary | ICD-10-CM

## 2013-05-19 DIAGNOSIS — J45901 Unspecified asthma with (acute) exacerbation: Secondary | ICD-10-CM

## 2013-05-19 DIAGNOSIS — R7309 Other abnormal glucose: Secondary | ICD-10-CM

## 2013-05-19 DIAGNOSIS — E538 Deficiency of other specified B group vitamins: Secondary | ICD-10-CM

## 2013-05-19 DIAGNOSIS — Z79899 Other long term (current) drug therapy: Secondary | ICD-10-CM | POA: Insufficient documentation

## 2013-05-19 LAB — COMPREHENSIVE METABOLIC PANEL
ALBUMIN: 3.8 g/dL (ref 3.5–5.2)
ALK PHOS: 66 U/L (ref 39–117)
ALT: 35 U/L (ref 0–53)
AST: 24 U/L (ref 0–37)
BUN: 18 mg/dL (ref 6–23)
CALCIUM: 9.5 mg/dL (ref 8.4–10.5)
CHLORIDE: 107 meq/L (ref 96–112)
CO2: 26 mEq/L (ref 19–32)
CREATININE: 1 mg/dL (ref 0.4–1.5)
GFR: 82.86 mL/min (ref >60.00)
GLUCOSE: 105 mg/dL — AB (ref 70–99)
POTASSIUM: 4 meq/L (ref 3.5–5.1)
Sodium: 138 mEq/L (ref 135–145)
Total Bilirubin: 0.7 mg/dL (ref 0.3–1.2)
Total Protein: 6.6 g/dL (ref 6.0–8.3)

## 2013-05-19 LAB — CBC WITH DIFFERENTIAL/PLATELET
BASOS PCT: 0.1 % (ref 0.0–3.0)
Basophils Absolute: 0 10*3/uL (ref 0.0–0.1)
EOS PCT: 0.2 % (ref 0.0–5.0)
Eosinophils Absolute: 0 10*3/uL (ref 0.0–0.7)
HCT: 43 % (ref 39.0–52.0)
HEMOGLOBIN: 14.2 g/dL (ref 13.0–17.0)
LYMPHS PCT: 5.8 % — AB (ref 12.0–46.0)
Lymphs Abs: 0.5 10*3/uL — ABNORMAL LOW (ref 0.7–4.0)
MCHC: 33.2 g/dL (ref 30.0–36.0)
MCV: 105.1 fl — ABNORMAL HIGH (ref 78.0–100.0)
Monocytes Absolute: 0.4 10*3/uL (ref 0.1–1.0)
Monocytes Relative: 4.9 % (ref 3.0–12.0)
NEUTROS ABS: 7.3 10*3/uL (ref 1.4–7.7)
NEUTROS PCT: 89 % — AB (ref 43.0–77.0)
Platelets: 250 10*3/uL (ref 150.0–400.0)
RBC: 4.09 Mil/uL — ABNORMAL LOW (ref 4.22–5.81)
RDW: 14.2 % (ref 11.5–14.6)
WBC: 8.3 10*3/uL (ref 4.5–10.5)

## 2013-05-19 LAB — URINALYSIS, ROUTINE W REFLEX MICROSCOPIC
Bilirubin Urine: NEGATIVE
Hgb urine dipstick: NEGATIVE
Ketones, ur: NEGATIVE
LEUKOCYTES UA: NEGATIVE
NITRITE: NEGATIVE
PH: 6 (ref 5.0–8.0)
SPECIFIC GRAVITY, URINE: 1.025 (ref 1.000–1.030)
Total Protein, Urine: NEGATIVE
UROBILINOGEN UA: 0.2 (ref 0.0–1.0)
Urine Glucose: NEGATIVE

## 2013-05-19 LAB — HEPATITIS C ANTIBODY: HCV AB: NEGATIVE

## 2013-05-19 LAB — LIPID PANEL
Cholesterol: 197 mg/dL (ref 0–200)
HDL: 62.7 mg/dL (ref >39.00)
LDL CALC: 118 mg/dL — AB (ref 0–99)
TRIGLYCERIDES: 81 mg/dL (ref 0.0–149.0)
Total CHOL/HDL Ratio: 3
VLDL: 16.2 mg/dL (ref 0.0–40.0)

## 2013-05-19 LAB — PSA: PSA: 0.29 ng/mL (ref 0.10–4.00)

## 2013-05-19 LAB — TSH: TSH: 0.56 u[IU]/mL (ref 0.35–5.50)

## 2013-05-19 LAB — FECAL OCCULT BLOOD, GUAIAC: FECAL OCCULT BLD: NEGATIVE

## 2013-05-19 LAB — HEMOGLOBIN A1C: Hgb A1c MFr Bld: 5.8 % (ref 4.6–6.5)

## 2013-05-19 MED ORDER — FLUTICASONE-SALMETEROL 115-21 MCG/ACT IN AERO: INHALATION_SPRAY | RESPIRATORY_TRACT | Status: DC

## 2013-05-19 MED ORDER — MONTELUKAST SODIUM 10 MG PO TABS: ORAL_TABLET | ORAL | Status: DC

## 2013-05-19 MED ORDER — ALBUTEROL SULFATE HFA 108 (90 BASE) MCG/ACT IN AERS: 2.0000 | INHALATION_SPRAY | Freq: Four times a day (QID) | RESPIRATORY_TRACT | Status: DC | PRN

## 2013-05-19 NOTE — Patient Instructions (Signed)
Health Maintenance, Males A healthy lifestyle and preventative care can promote health and wellness.  Maintain regular health, dental, and eye exams.  Eat a healthy diet. Foods like vegetables, fruits, whole grains, low-fat dairy products, and lean protein foods contain the nutrients you need and are low in calories. Decrease your intake of foods high in solid fats, added sugars, and salt. Get information about a proper diet from your health care provider, if necessary.  Regular physical exercise is one of the most important things you can do for your health. Most adults should get at least 150 minutes of moderate-intensity exercise (any activity that increases your heart rate and causes you to sweat) each week. In addition, most adults need muscle-strengthening exercises on 2 or more days a week.   Maintain a healthy weight. The body mass index (BMI) is a screening tool to identify possible weight problems. It provides an estimate of body fat based on height and weight. Your health care provider can find your BMI and can help you achieve or maintain a healthy weight. For males 20 years and older:  A BMI below 18.5 is considered underweight.  A BMI of 18.5 to 24.9 is normal.  A BMI of 25 to 29.9 is considered overweight.  A BMI of 30 and above is considered obese.  Maintain normal blood lipids and cholesterol by exercising and minimizing your intake of saturated fat. Eat a balanced diet with plenty of fruits and vegetables. Blood tests for lipids and cholesterol should begin at age 81 and be repeated every 5 years. If your lipid or cholesterol levels are high, you are over 50, or you are at high risk for heart disease, you may need your cholesterol levels checked more frequently.Ongoing high lipid and cholesterol levels should be treated with medicines, if diet and exercise are not working.  If you smoke, find out from your health care provider how to quit. If you do not use tobacco, do not  start.  Lung cancer screening is recommended for adults aged 54 80 years who are at high risk for developing lung cancer because of a history of smoking. A yearly low-dose CT scan of the lungs is recommended for people who have at least a 30-pack-year history of smoking and are a current smoker or have quit within the past 15 years. A pack year of smoking is smoking an average of 1 pack of cigarettes a day for 1 year (for example, a 30-pack-year history of smoking could mean smoking 1 pack a day for 30 years or 2 packs a day for 15 years). Yearly screening should continue until the smoker has stopped smoking for at least 15 years. Yearly screening should be stopped for people who develop a health problem that would prevent them from having lung cancer treatment.  If you choose to drink alcohol, do not have more than 2 drinks per day. One drink is considered to be 12 oz (360 mL) of beer, 5 oz (150 mL) of wine, or 1.5 oz (45 mL) of liquor.  Avoid use of street drugs. Do not share needles with anyone. Ask for help if you need support or instructions about stopping the use of drugs.  High blood pressure causes heart disease and increases the risk of stroke. Blood pressure should be checked at least every 1 2 years. Ongoing high blood pressure should be treated with medicines if weight loss and exercise are not effective.  If you are 4 64 years old, ask your health  care provider if you should take aspirin to prevent heart disease.  Diabetes screening involves taking a blood sample to check your fasting blood sugar level. This should be done once every 3 years after age 28, if you are at a normal weight and without risk factors for diabetes. Testing should be considered at a younger age or be carried out more frequently if you are overweight and have at least 1 risk factor for diabetes.  Colorectal cancer can be detected and often prevented. Most routine colorectal cancer screening begins at the age of 4  and continues through age 57. However, your health care provider may recommend screening at an earlier age if you have risk factors for colon cancer. On a yearly basis, your health care provider may provide home test kits to check for hidden blood in the stool. A small camera at the end of a tube may be used to directly examine the colon (sigmoidoscopy or colonoscopy) to detect the earliest forms of colorectal cancer. Talk to your health care provider about this at age 16, when routine screening begins. A direct exam of the colon should be repeated every 5 10 years through age 55, unless early forms of pre-cancerous polyps or small growths are found.  People who are at an increased risk for hepatitis B should be screened for this virus. You are considered at high risk for hepatitis B if:  You were born in a country where hepatitis B occurs often. Talk with your health care provider about which countries are considered high-risk.  Your parents were born in a high-risk country and you have not received a shot to protect against hepatitis B (hepatitis B vaccine).  You have HIV or AIDS.  You use needles to inject street drugs.  You live with, or have sex with, someone who has hepatitis B.  You are a man who has sex with other men (MSM).  You get hemodialysis treatment.  You take certain medicines for conditions like cancer, organ transplantation, and autoimmune conditions.  Hepatitis C blood testing is recommended for all people born from [birth year] through 1966 and any individual with known risk factors for hepatitis C.  Healthy men should no longer receive prostate-specific antigen (PSA) blood tests as part of routine cancer screening. Talk to your health care provider about prostate cancer screening.  Testicular cancer screening is not recommended for adolescents or adult males who have no symptoms. Screening includes self-exam, a health care provider exam, and other screening tests. Consult with  your health care provider about any symptoms you have or any concerns you have about testicular cancer.  Practice safe sex. Use condoms and avoid high-risk sexual practices to reduce the spread of sexually transmitted infections (STIs).  Use sunscreen. Apply sunscreen liberally and repeatedly throughout the day. You should seek shade when your shadow is shorter than you. Protect yourself by wearing long sleeves, pants, a wide-brimmed hat, and sunglasses year round, whenever you are outdoors.  Tell your health care provider of new moles or changes in moles, especially if there is a change in shape or color. Also tell your provider if a mole is larger than the size of a pencil eraser.  A one-time screening for abdominal aortic aneurysm (AAA) and surgical repair of large AAAs by ultrasound is recommended for men aged 65 75 years who are current or former smokers.  Stay current with your vaccines (immunizations). Document Released: 09/07/2007 Document Revised: 12/30/2012 Document Reviewed: 08/06/2010 [facility] Patient Information 2014 [place], [place].  Upper Respiratory Infection, Adult An upper respiratory infection (URI) is also known as the common cold. It is often caused by a type of germ (virus). Colds are easily spread (contagious). You can pass it to others by kissing, coughing, sneezing, or drinking out of the same glass. Usually, you get better in 1 or 2 weeks.  HOME CARE   Only take medicine as told by your doctor.  Use a warm mist humidifier or breathe in steam from a hot shower.  Drink enough water and fluids to keep your pee (urine) clear or pale yellow.  Get plenty of rest.  Return to work when your temperature is back to normal or as told by your doctor. You may use a face mask and wash your hands to stop your cold from spreading. GET HELP RIGHT AWAY IF:   After the first few days, you feel you are getting worse.  You have questions about your medicine.  You have chills,  shortness of breath, or brown or red spit (mucus).  You have yellow or brown snot (nasal discharge) or pain in the face, especially when you bend forward.  You have a fever, puffy (swollen) neck, pain when you swallow, or white spots in the back of your throat.  You have a bad headache, ear pain, sinus pain, or chest pain.  You have a high-pitched whistling sound when you breathe in and out (wheezing).  You have a lasting cough or cough up blood.  You have sore muscles or a stiff neck. MAKE SURE YOU:   Understand these instructions.  Will watch your condition.  Will get help right away if you are not doing well or get worse. Document Released: 08/28/2007 Document Revised: 06/03/2011 Document Reviewed: 07/16/2010 [facility] Patient Information 2014 [place], [place].

## 2013-05-19 NOTE — Progress Notes (Signed)
Subjective:    Patient ID: Darryl [name], male    DOB: 28-Jul-1950, 63 y.o.   MRN: [id number]  Cough This is a new problem. The current episode started yesterday. The problem has been unchanged. The problem occurs every few hours. The cough is non-productive. Associated symptoms include wheezing. Pertinent negatives include no chest pain, chills, ear congestion, ear pain, fever, headaches, heartburn, hemoptysis, myalgias, nasal congestion, postnasal drip, rash, rhinorrhea, sore throat, shortness of breath, sweats or weight loss. Nothing aggravates the symptoms. He has tried a beta-agonist inhaler, OTC cough suppressant and steroid inhaler for the symptoms. The treatment provided significant relief. His past medical history is significant for asthma. There is no history of bronchiectasis, bronchitis, COPD, emphysema, environmental allergies or pneumonia.      Review of Systems  Constitutional: Negative.  Negative for fever, chills, weight loss, diaphoresis, appetite change and fatigue.  HENT: Negative.  Negative for ear pain, postnasal drip, rhinorrhea and sore throat.   Eyes: Negative.   Respiratory: Positive for cough and wheezing. Negative for apnea, hemoptysis, choking, chest tightness, shortness of breath and stridor.   Cardiovascular: Negative.  Negative for chest pain, palpitations and leg swelling.  Gastrointestinal: Negative.  Negative for heartburn, nausea, vomiting, abdominal pain, diarrhea, constipation and blood in stool.  Endocrine: Negative.   Genitourinary: Negative.   Musculoskeletal: Negative.  Negative for myalgias.  Skin: Negative.  Negative for rash.  Allergic/Immunologic: Negative.  Negative for environmental allergies.  Neurological: Negative.  Negative for headaches.  Hematological: Negative.  Negative for adenopathy. Does not bruise/bleed easily.  Psychiatric/Behavioral: Negative.        Objective:   Physical Exam  Vitals reviewed. Constitutional: He is  oriented to person, place, and time. He appears well-developed and well-nourished. No distress.  HENT:  Head: Normocephalic and atraumatic.  Mouth/Throat: Oropharynx is clear and moist. No oropharyngeal exudate.  Eyes: Conjunctivae are normal. Right eye exhibits no discharge. Left eye exhibits no discharge. No scleral icterus.  Neck: Normal range of motion. Neck supple. No JVD present. No tracheal deviation present. No thyromegaly present.  Cardiovascular: Normal rate, regular rhythm, normal heart sounds and intact distal pulses.  Exam reveals no gallop and no friction rub.   No murmur heard. Pulmonary/Chest: Effort normal and breath sounds normal. No stridor. No respiratory distress. He has no wheezes. He has no rales. He exhibits no tenderness.  Abdominal: Soft. Bowel sounds are normal. He exhibits no distension and no mass. There is no tenderness. There is no rebound and no guarding. Hernia confirmed negative in the right inguinal area and confirmed negative in the left inguinal area.  Genitourinary: Rectum normal, testes normal and penis normal. Rectal exam shows no external hemorrhoid, no internal hemorrhoid, no fissure, no mass, no tenderness and anal tone normal. Guaiac negative stool. Prostate is enlarged (1+ smooth symm BPH). Prostate is not tender. Right testis shows no mass, no swelling and no tenderness. Right testis is descended. Left testis shows no mass, no swelling and no tenderness. Left testis is descended. Circumcised. No penile erythema or penile tenderness. No discharge found.  Musculoskeletal: Normal range of motion. He exhibits no edema and no tenderness.  Lymphadenopathy:    He has no cervical adenopathy.       Right: No inguinal adenopathy present.       Left: No inguinal adenopathy present.  Neurological: He is oriented to person, place, and time.  Skin: Skin is warm and dry. No rash noted. He is not diaphoretic. No erythema. No  pallor.  Psychiatric: He has a normal mood  and affect. His behavior is normal. Judgment and thought content normal.     Lab Results  Component Value Date   WBC 6.1 02/05/2013   HGB 15.2 02/05/2013   HCT 43.9 02/05/2013   PLT 272.0 02/05/2013   GLUCOSE 109* 02/05/2013   CHOL 207* 03/09/2008   TRIG 86 03/09/2008   HDL 62 03/09/2008   LDLCALC 128* 03/09/2008   ALT 38 02/05/2013   AST 25 02/05/2013   NA 136 02/05/2013   K 4.6 02/05/2013   CL 105 02/05/2013   CREATININE 1.0 02/05/2013   BUN 15 02/05/2013   CO2 29 02/05/2013   TSH 0.69 02/05/2013   PSA 0.28 04/27/2009       Assessment & Plan:

## 2013-05-19 NOTE — Assessment & Plan Note (Signed)
Exam done Vaccines were updated Labs ordered EKG is normal Pt ed material was given

## 2013-05-19 NOTE — Progress Notes (Signed)
Pre visit review using our clinic review tool, if applicable. No additional management support is needed unless otherwise documented below in the visit note. 

## 2013-05-19 NOTE — Assessment & Plan Note (Signed)
I will check his A1C to see if he has developed DM2

## 2013-05-19 NOTE — Assessment & Plan Note (Signed)
He has had a mild viral URI but is responding well to his inhalers and OTC meds so no changes for now

## 2013-05-20 ENCOUNTER — Encounter: Payer: Self-pay | Admitting: Internal Medicine

## 2013-06-23 ENCOUNTER — Telehealth: Payer: Self-pay | Admitting: *Deleted

## 2013-06-23 NOTE — Telephone Encounter (Signed)
Left a message for patient to call me. Needs to schedule OV with Pyrtle per last OV note with Dr. Sharlett Iles.

## 2013-06-23 NOTE — Telephone Encounter (Signed)
Spoke with patient and scheduled him on 08/11/13 at 9:00 AM with Dr. Hilarie Fredrickson.

## 2013-06-23 NOTE — Telephone Encounter (Signed)
Message copied by Hulan Saas on Wed Jun 23, 2013  8:32 AM ------      Message from: Marlon Pel      Created: Mon May 10, 2013  4:09 PM       Needs to follow up on 6 mp ;abs- patterson patient ------

## 2013-07-05 ENCOUNTER — Other Ambulatory Visit: Payer: Self-pay | Admitting: Gastroenterology

## 2013-07-05 NOTE — Telephone Encounter (Signed)
Ok for refill? 

## 2013-07-05 NOTE — Telephone Encounter (Signed)
Patient has a follow up visit with you for 08-11-13. Is it okay to refill folic acid til then?

## 2013-08-10 ENCOUNTER — Encounter: Payer: Self-pay | Admitting: Internal Medicine

## 2013-08-11 ENCOUNTER — Other Ambulatory Visit (INDEPENDENT_AMBULATORY_CARE_PROVIDER_SITE_OTHER): Payer: BC Managed Care – PPO

## 2013-08-11 ENCOUNTER — Other Ambulatory Visit: Payer: Self-pay

## 2013-08-11 ENCOUNTER — Ambulatory Visit (INDEPENDENT_AMBULATORY_CARE_PROVIDER_SITE_OTHER): Payer: BC Managed Care – PPO | Admitting: Internal Medicine

## 2013-08-11 ENCOUNTER — Encounter: Payer: Self-pay | Admitting: Internal Medicine

## 2013-08-11 VITALS — BP 128/80 | HR 78 | Ht 65.75 in | Wt 195.0 lb

## 2013-08-11 DIAGNOSIS — K219 Gastro-esophageal reflux disease without esophagitis: Secondary | ICD-10-CM

## 2013-08-11 DIAGNOSIS — Z79899 Other long term (current) drug therapy: Secondary | ICD-10-CM

## 2013-08-11 DIAGNOSIS — D7589 Other specified diseases of blood and blood-forming organs: Secondary | ICD-10-CM

## 2013-08-11 DIAGNOSIS — E538 Deficiency of other specified B group vitamins: Secondary | ICD-10-CM

## 2013-08-11 DIAGNOSIS — K5090 Crohn's disease, unspecified, without complications: Secondary | ICD-10-CM

## 2013-08-11 LAB — COMPREHENSIVE METABOLIC PANEL
ALT: 40 U/L (ref 0–53)
AST: 36 U/L (ref 0–37)
Albumin: 4 g/dL (ref 3.5–5.2)
Alkaline Phosphatase: 64 U/L (ref 39–117)
BUN: 17 mg/dL (ref 6–23)
CALCIUM: 9.3 mg/dL (ref 8.4–10.5)
CHLORIDE: 107 meq/L (ref 96–112)
CO2: 26 mEq/L (ref 19–32)
Creatinine, Ser: 1.1 mg/dL (ref 0.4–1.5)
GFR: 72.37 mL/min (ref >60.00)
Glucose, Bld: 100 mg/dL — ABNORMAL HIGH (ref 70–99)
Potassium: 4.3 mEq/L (ref 3.5–5.1)
Sodium: 140 mEq/L (ref 135–145)
Total Bilirubin: 0.9 mg/dL (ref 0.2–1.2)
Total Protein: 6.9 g/dL (ref 6.0–8.3)

## 2013-08-11 LAB — CBC
HCT: 44.4 % (ref 39.0–52.0)
HEMOGLOBIN: 15.3 g/dL (ref 13.0–17.0)
MCHC: 34.4 g/dL (ref 30.0–36.0)
MCV: 104.6 fl — ABNORMAL HIGH (ref 78.0–100.0)
PLATELETS: 261 10*3/uL (ref 150.0–400.0)
RBC: 4.24 Mil/uL (ref 4.22–5.81)
RDW: 14.2 % (ref 11.5–15.5)
WBC: 6 10*3/uL (ref 4.0–10.5)

## 2013-08-11 LAB — VITAMIN B12: Vitamin B-12: 365 pg/mL (ref 211–911)

## 2013-08-11 MED ORDER — MERCAPTOPURINE 50 MG PO TABS: ORAL_TABLET | ORAL | Status: DC

## 2013-08-11 MED ORDER — VITAMIN B-12 1000 MCG PO TABS: 1000.0000 ug | ORAL_TABLET | Freq: Every day | ORAL | Status: DC

## 2013-08-11 MED ORDER — FOLIC ACID 1 MG PO TABS: 1.0000 mg | ORAL_TABLET | Freq: Every day | ORAL | Status: DC

## 2013-08-11 NOTE — Patient Instructions (Signed)
Your physician has requested that you go to the basement for the following lab work before leaving today: CBC, CMP, Vitamin D B-12  We have sent the following medications to your pharmacy for you to pick up at your [id number] Folic acid  Follow up with Dr. Hilarie Fredrickson in office in 6 months

## 2013-08-11 NOTE — Progress Notes (Signed)
Subjective:    Patient ID: Darryl [name], male    DOB: 1950/03/30, 63 y.o.   MRN: [id number]  HPI Darryl [name] is a 63 yo male with PMH of Crohn's colitis for over 20 years currently managed on 6-MP, GERD who is seen in office followup. This is his first visit with me and he was managed for years by Dr. Verl Blalock prior to his retirement. He is alone today. He reports he is feeling well. He denies symptoms of active colitis. He reports no abdominal pain. He is having 2-3 formed stools daily. No blood in his stool or melena. Good appetite without weight loss. No hepatobiliary complaints. No dysphagia or odynophagia. GERD has been well controlled on over-the-counter Nexium 40 mg daily. No early satiety. No rashes, new joint pains or eye complaints.  No oral ulcers.  Chart view indicates 6-MP was discontinued several years ago resulting in flare. He had a thiopurine panel performed in August 2014 which revealed an elevated 6MMPN metabolite and this was associated with an elevated AST. Dose was adjusted and repeat thiopurine panel was normal in November of 14 and LFTs normalized.   Review of Systems As per history of present illness, otherwise negative  Current Medications, Allergies, Past Medical History, Past Surgical History, Family History and Social History were reviewed in [employer] record.     Objective:   Physical Exam BP 128/80  Pulse 78  Ht 5' 5.75" (1.67 m)  Wt 195 lb (88.451 kg)  BMI 31.72 kg/m2 Constitutional: Well-developed and well-nourished. No distress. HEENT: Normocephalic and atraumatic. Oropharynx is clear and moist. No oropharyngeal exudate. Conjunctivae are normal.  No scleral icterus. Neck: Neck supple. Trachea midline. Cardiovascular: Normal rate, regular rhythm and intact distal pulses. No M/R/G Pulmonary/chest: Effort normal and breath sounds normal. No wheezing, rales or rhonchi. Abdominal: Soft, nontender, nondistended. Bowel sounds  active throughout. Extremities: no clubbing, cyanosis, or edema Lymphadenopathy: No cervical adenopathy noted. Neurological: Alert and oriented to person place and time. Skin: Skin is warm and dry. No rashes noted. Psychiatric: Normal mood and affect. Behavior is normal.  CBC    Component Value Date/Time   WBC 8.3 05/19/2013 1019   RBC 4.09* 05/19/2013 1019   HGB 14.2 05/19/2013 1019   HCT 43.0 05/19/2013 1019   PLT 250.0 05/19/2013 1019   MCV 105.1* 05/19/2013 1019   MCHC 33.2 05/19/2013 1019   RDW 14.2 05/19/2013 1019   LYMPHSABS 0.5* 05/19/2013 1019   MONOABS 0.4 05/19/2013 1019   EOSABS 0.0 05/19/2013 1019   BASOSABS 0.0 05/19/2013 1019    CMP     Component Value Date/Time   NA 138 05/19/2013 1019   K 4.0 05/19/2013 1019   CL 107 05/19/2013 1019   CO2 26 05/19/2013 1019   GLUCOSE 105* 05/19/2013 1019   BUN 18 05/19/2013 1019   CREATININE 1.0 05/19/2013 1019   CALCIUM 9.5 05/19/2013 1019   PROT 6.6 05/19/2013 1019   ALBUMIN 3.8 05/19/2013 1019   AST 24 05/19/2013 1019   ALT 35 05/19/2013 1019   ALKPHOS 66 05/19/2013 1019   BILITOT 0.7 05/19/2013 1019   GFRNONAA 72.63 04/27/2009 0848   GFRAA 99 02/12/2008 0935    Folate - normal  Thiopurine panel normal 02/05/2013  Colonoscopy 11/01/2011 -- right colon scarring. No polyps. Biopsies from the right and left colon plus rectum were benign without evidence of active or chronic colitis. No dysplasia.    Assessment & Plan:  63 yo male with  PMH of Crohn's colitis for over 20 years currently managed on 6-MP, GERD who is seen in office followup  1.  Crohn's colitis -- in remission clinically and doing well on 6-MP. Liver enzymes and white count were normal in February. MCV was slightly elevated at 105 with a normal folate, I will check B12. For now we will continue current dose of 6-MP 100 mg alternating with 75 mg daily. We did discuss surveillance colonoscopy and how guidelines support IBD surveillance every 12-24 months. He was previously in the  impression of every 3 years. He would like to wait until later this year for repeat colonoscopy. I will see him back in 6 months and we can discuss colonoscopy scheduling at that time. --Previous Pneumovax given  2.  GERD -- controlled on chronic Nexium 40 mg daily. Will prescribe generic esomeprazole 40 mg daily.  Return in 6 months, sooner if necessary

## 2013-08-12 LAB — VITAMIN D 25 HYDROXY (VIT D DEFICIENCY, FRACTURES): Vit D, 25-Hydroxy: 27 ng/mL — ABNORMAL LOW (ref 30–89)

## 2013-08-13 ENCOUNTER — Other Ambulatory Visit: Payer: Self-pay

## 2013-08-13 MED ORDER — PANTOPRAZOLE SODIUM 40 MG PO TBEC: 40.0000 mg | DELAYED_RELEASE_TABLET | Freq: Every day | ORAL | Status: DC

## 2013-09-30 ENCOUNTER — Ambulatory Visit (INDEPENDENT_AMBULATORY_CARE_PROVIDER_SITE_OTHER)
Admission: RE | Admit: 2013-09-30 | Discharge: 2013-09-30 | Disposition: A | Discharge status: Home / Self Care | Payer: BC Managed Care – PPO | Source: Ambulatory Visit | Attending: Internal Medicine | Admitting: Internal Medicine

## 2013-09-30 ENCOUNTER — Other Ambulatory Visit (INDEPENDENT_AMBULATORY_CARE_PROVIDER_SITE_OTHER): Payer: BC Managed Care – PPO

## 2013-09-30 ENCOUNTER — Ambulatory Visit (INDEPENDENT_AMBULATORY_CARE_PROVIDER_SITE_OTHER): Payer: BC Managed Care – PPO | Admitting: Internal Medicine

## 2013-09-30 ENCOUNTER — Encounter: Payer: Self-pay | Admitting: Internal Medicine

## 2013-09-30 VITALS — BP 152/102 | HR 88 | Temp 98.2°F | Wt 193.2 lb

## 2013-09-30 DIAGNOSIS — J209 Acute bronchitis, unspecified: Secondary | ICD-10-CM

## 2013-09-30 DIAGNOSIS — J0100 Acute maxillary sinusitis, unspecified: Secondary | ICD-10-CM

## 2013-09-30 DIAGNOSIS — J45909 Unspecified asthma, uncomplicated: Secondary | ICD-10-CM

## 2013-09-30 DIAGNOSIS — J4520 Mild intermittent asthma, uncomplicated: Secondary | ICD-10-CM

## 2013-09-30 LAB — CBC WITH DIFFERENTIAL/PLATELET
BASOS PCT: 0.7 % (ref 0.0–3.0)
Basophils Absolute: 0.1 10*3/uL (ref 0.0–0.1)
EOS PCT: 0.2 % (ref 0.0–5.0)
Eosinophils Absolute: 0 10*3/uL (ref 0.0–0.7)
HCT: 42.5 % (ref 39.0–52.0)
Hemoglobin: 14.6 g/dL (ref 13.0–17.0)
Lymphocytes Relative: 22.9 % (ref 12.0–46.0)
Lymphs Abs: 2.6 10*3/uL (ref 0.7–4.0)
MCHC: 34.2 g/dL (ref 30.0–36.0)
MCV: 103.6 fl — AB (ref 78.0–100.0)
MONOS PCT: 8.5 % (ref 3.0–12.0)
Monocytes Absolute: 1 10*3/uL (ref 0.1–1.0)
NEUTROS PCT: 67.7 % (ref 43.0–77.0)
Neutro Abs: 7.8 10*3/uL — ABNORMAL HIGH (ref 1.4–7.7)
Platelets: 427 10*3/uL — ABNORMAL HIGH (ref 150.0–400.0)
RBC: 4.11 Mil/uL — ABNORMAL LOW (ref 4.22–5.81)
RDW: 13.3 % (ref 11.5–15.5)
WBC: 11.5 10*3/uL — AB (ref 4.0–10.5)

## 2013-09-30 IMAGING — CR DG CHEST 2V
2 series · 2 of 2 positions shown · non-contrast
Comparison: None

CLINICAL DATA: Congestion and cough for 11 days, nonsmoker, history
asthma

EXAM:
CHEST  2 VIEW

[view not recorded (1 of 2)]
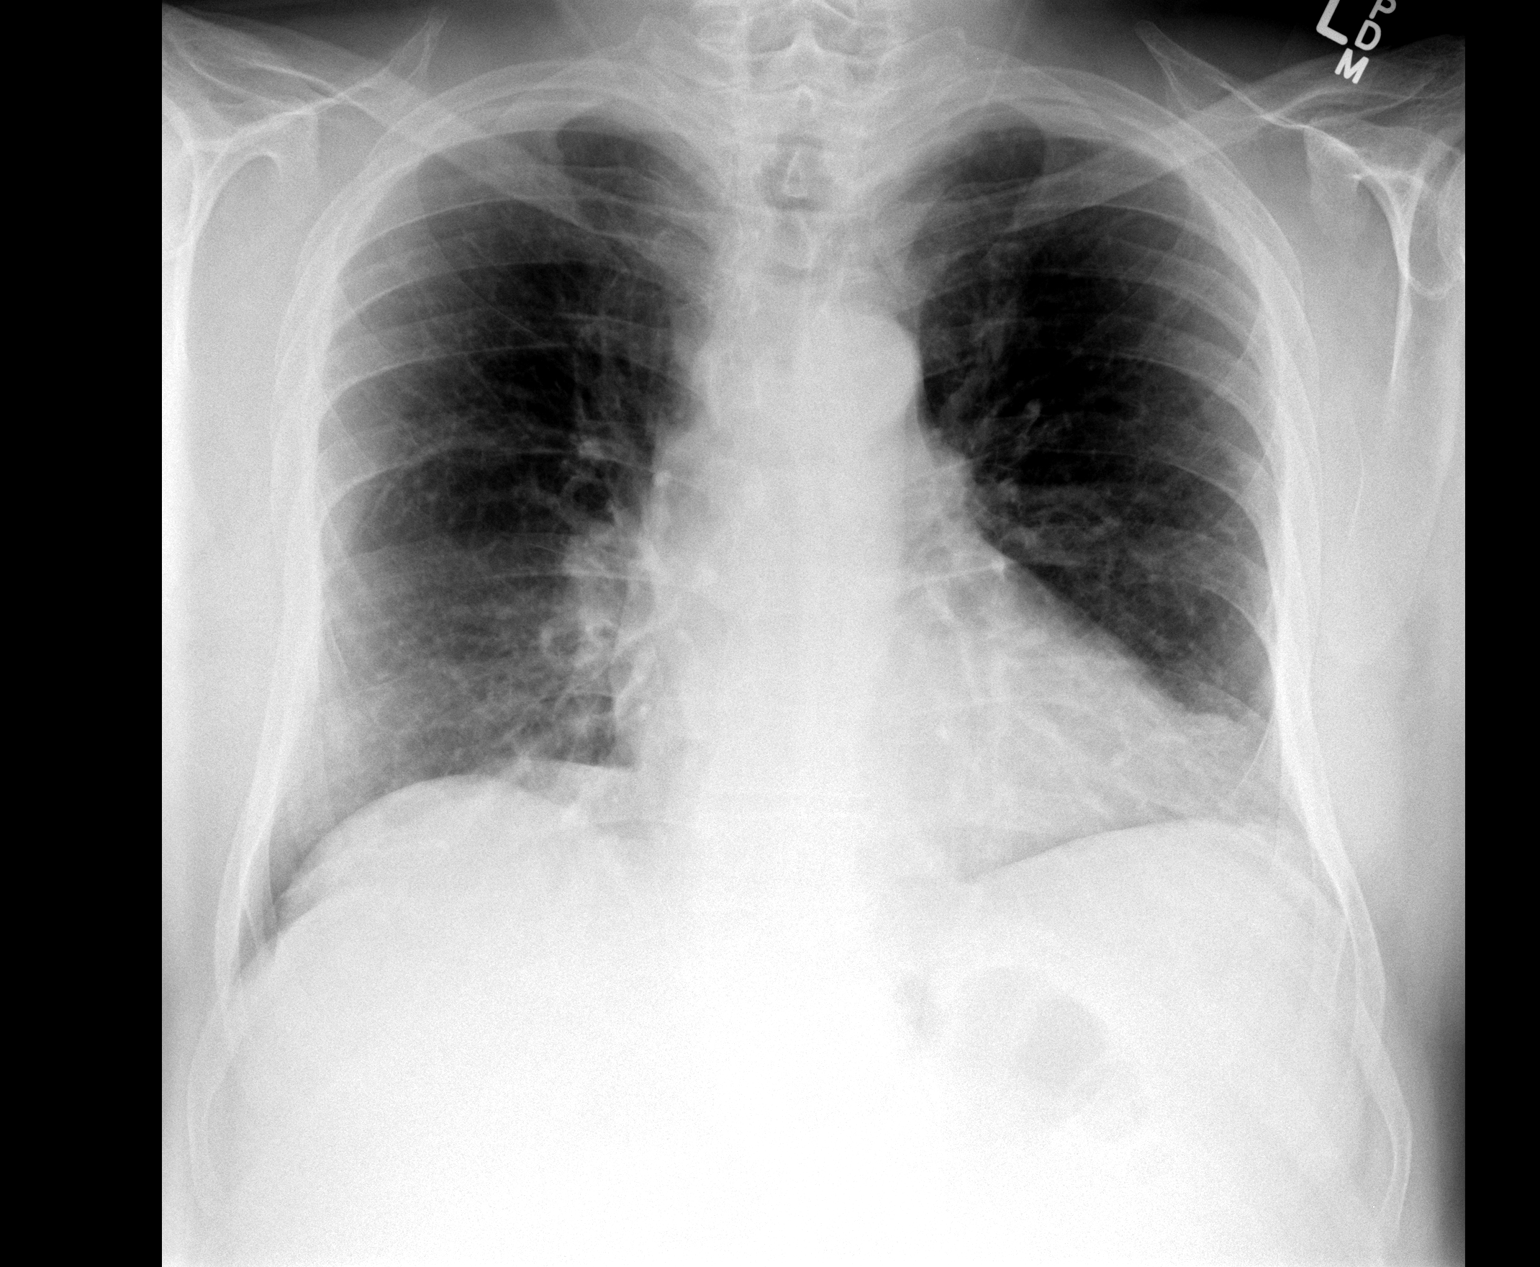

[view not recorded (2 of 2)]
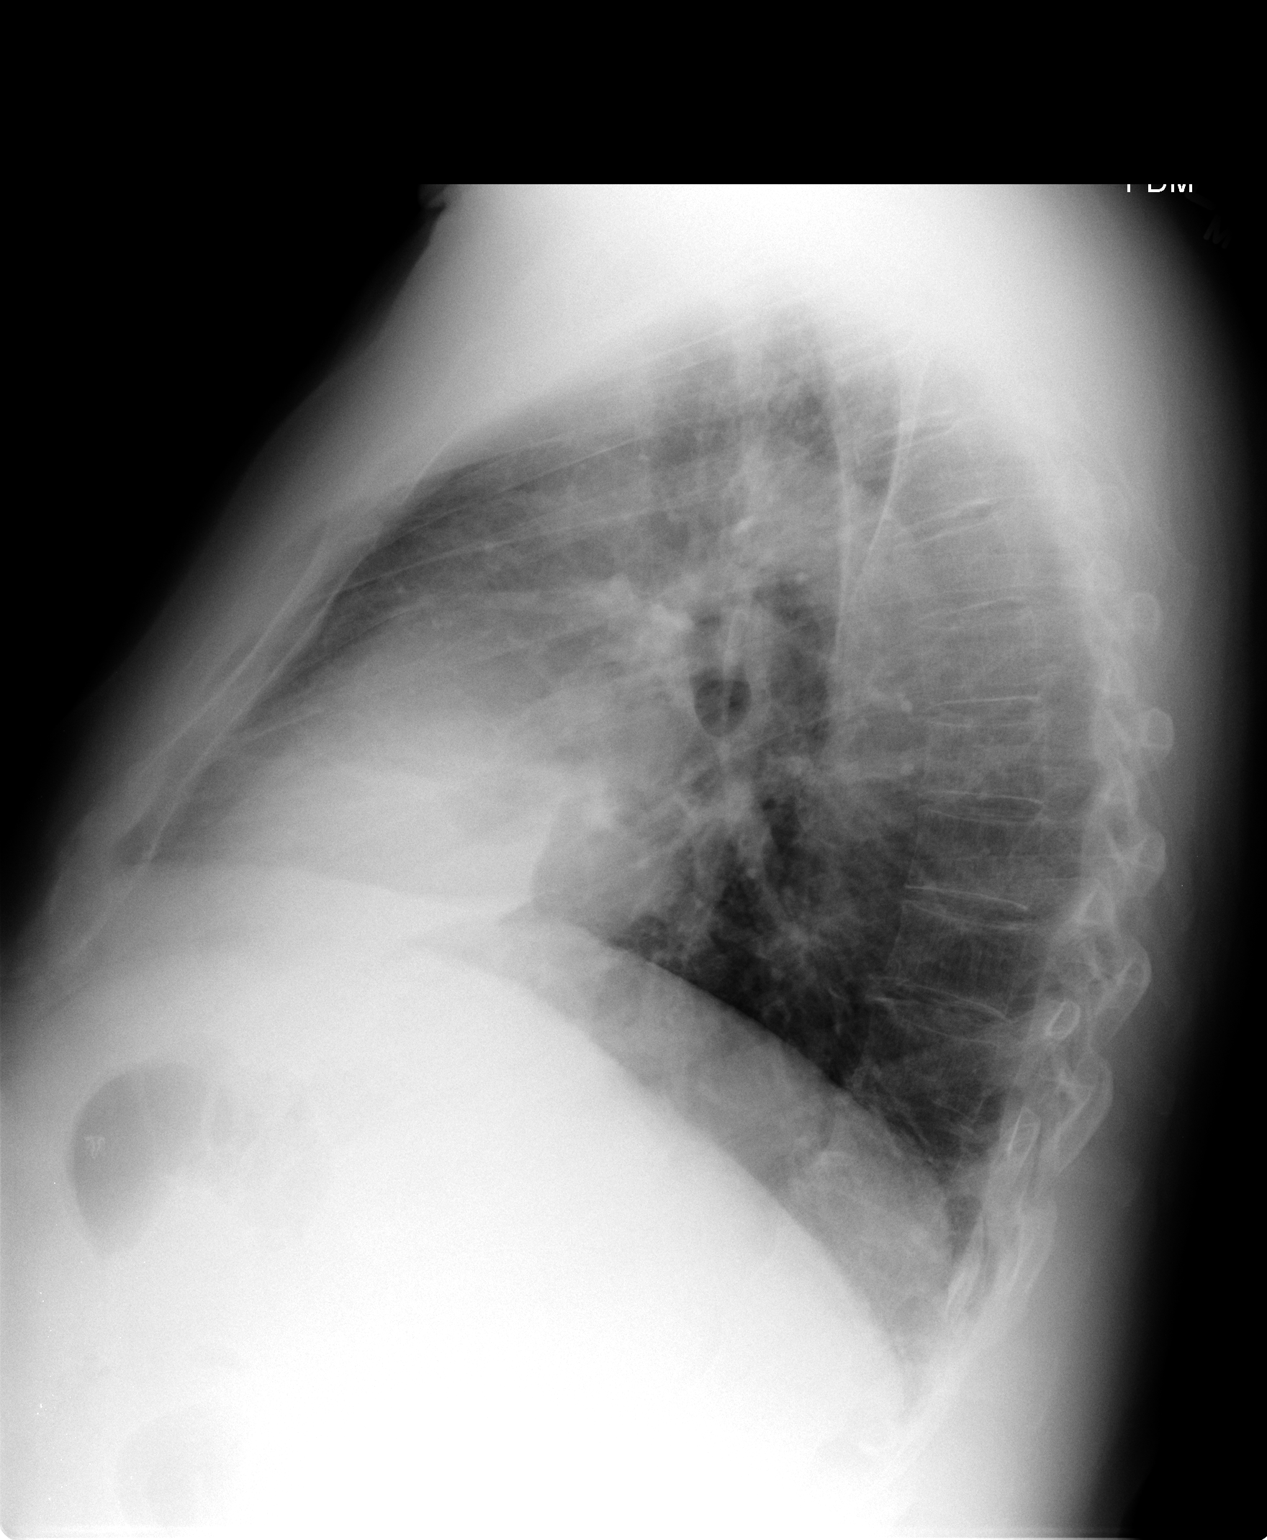

[2 of 2 positions shown; findings below may reference images not displayed]

FINDINGS: Enlargement of cardiac silhouette.

Mediastinal contours and pulmonary vascularity normal.

Lungs grossly clear without infiltrate or effusion.

No pneumothorax.

Bones demineralized.
IMPRESSION: Enlargement of cardiac silhouette.

No acute infiltrate.

## 2013-09-30 MED ORDER — PREDNISONE 20 MG PO TABS: 20.0000 mg | ORAL_TABLET | Freq: Two times a day (BID) | ORAL | Status: DC

## 2013-09-30 MED ORDER — LEVOFLOXACIN 500 MG PO TABS: 500.0000 mg | ORAL_TABLET | Freq: Every day | ORAL | Status: DC

## 2013-09-30 NOTE — Progress Notes (Signed)
   Subjective:    Patient ID: Darryl [name], male    DOB: [birth year], 63 y.o.   MRN: [id number]  HPI   Since 09/20/13 he has had a productive cough and head congestion. Symptoms have persisted but are worse at night. His cough is productive of thick yellow sputum. He also describes a lesser volume of nasal purulence.  He describes pain in the maxillary sinus areas. He has some sore throat.  The illness has been associated with nocturnal chills and sweats.  He has used Robitussin, Advil sinus and cold with minimal response  He does have albuterol which he's been using "as little as possible". Last week he was prescribed a course of azithromycin which he completed 2 days ago. Also he was given a shot of steroids. There was minimal improvement with this regimen      Review of Systems  At this time he denies frontal headache, otic pain, otic discharge       Objective:   Physical Exam  Significant or distinguishing  findings on physical exam are documented first.  Below that are other systems examined & findings.    He is unshaven but appears healthy and well-nourished  There is erythema of the nasal mucosa. The nasal septum is deviated to the right.  Erythema of hard palate. Tongue coated  He has minimal scattered low-grade rales diffusely without increased work of breathing.    No  lymphadenopathy about the head, neck, or axilla noted.   Eyes: No conjunctival inflammation or lid edema is present. There is no scleral icterus.  Ears:  External ear exam shows no significant lesions or deformities.  Otoscopic examination reveals clear canals, tympanic membranes are intact bilaterally without bulging, retraction, inflammation or discharge.  Nose:  External nasal examination shows no deformity or inflammation.   Oral exam: Dental hygiene is good; lips and gums are healthy appearing. Neck:  No deformities, thyromegaly, masses, or tenderness noted.   Supple with full range of  motion without pain.   Heart:  Normal rate and regular rhythm. S1 and S2 normal without gallop, murmur, click, rub or other extra sounds. Repeat BP 136/98.   Lungs:.No increased work of breathing.    Extremities:  No cyanosis, edema, or clubbing  noted    Skin: Warm & dry        Assessment & Plan:  #1 acute bronchitis with minimal bronchospasm #2 maxillary sinusitis #3 asthma Plan: See orders and recommendations

## 2013-09-30 NOTE — Patient Instructions (Signed)
Your next office appointment will be determined based upon review of your pending labs & x-rays. Those instructions will be transmitted to you through My Chart. Plain Mucinex (NOT D) for thick secretions ;force NON dairy fluids .   Nasal cleansing in the shower as discussed with lather of mild shampoo.After 10 seconds wash off lather while  exhaling through nostrils. Make sure that all residual soap is removed to prevent irritation.  Flonase OR Nasacort AQ 1 spray in each nostril twice a day as needed. Use the "crossover" technique into opposite nostril spraying toward opposite ear @ 45 degree angle, not straight up into nostril.  Use a Neti pot daily only  as needed for significant sinus congestion; going from open side to congested side . Plain Allegra (NOT D )  160 daily , Loratidine 10 mg , OR Zyrtec 10 mg @ bedtime  as needed for itchy eyes & sneezing.  Minimal Blood Pressure Goal= AVERAGE < 140/90;  Ideal is an AVERAGE < 135/85. This AVERAGE should be calculated from @ least 5-7 BP readings taken @ different times of day on different days of week. You should not respond to isolated BP readings , but rather the AVERAGE for that week .Please bring your  blood pressure cuff to office visits to verify that it is reliable.It  can also be checked against the blood pressure device at the pharmacy. Finger or wrist cuffs are not dependable; an arm cuff is.Avoid decongestants.

## 2013-09-30 NOTE — Progress Notes (Signed)
   Subjective:    Patient ID: Darryl [name], male    DOB: 1951/03/14, 63 y.o.   MRN: [id number]  HPI Pt's symptoms began 09/20/13 as a productive cough and head congestion. His symptoms have been constant since this time and worse at night. The cough is productive of thick yellow sputum with associated PND and sore throat. The head congestion consists of nasal congestion and report of ear popping. He has yellow rhinorrhea. He also reports maxillary sinus pressure. He has had some chills and sweats at night. The pt has tried robitussin and advil sinus and cold with no relief.    The pt was exposed to an 2 month old sick grandson. He also has numerous environmental allergies and a history of asthma. He is currently using his advair daily and taking singulair, though he has had some increased SOB since his URI symptoms began.   [place] [facility] [place] was given an IM shot of steroid and Z-pack. He finished the Z-pack 09/28/13. The pt had minimal improvement with symptoms despite antibiotics.  Review of Systems  Constitutional: Negative for fever.  HENT: Negative for ear discharge, ear pain, sneezing and tinnitus.   Eyes: Negative for redness.  Cardiovascular: Negative for chest pain.  Neurological: Negative for headaches.       Objective:   Physical Exam R septal deviation  Bilateral cerumen impaction  Thrush on tongue     Assessment & Plan:  #1 acute bronchitis vs acute sinusitis   Consider giving course of doxy 179m BID for 5-7 days, as pt is allergic to amoxicillin and sulfa

## 2013-09-30 NOTE — Progress Notes (Signed)
Pre visit review using our clinic review tool, if applicable. No additional management support is needed unless otherwise documented below in the visit note. 

## 2013-11-01 ENCOUNTER — Other Ambulatory Visit: Payer: Self-pay

## 2013-11-01 MED ORDER — FLUTICASONE-SALMETEROL 115-21 MCG/ACT IN AERO: INHALATION_SPRAY | RESPIRATORY_TRACT | Status: DC

## 2013-12-01 ENCOUNTER — Encounter: Payer: Self-pay | Admitting: Internal Medicine

## 2013-12-01 ENCOUNTER — Other Ambulatory Visit (INDEPENDENT_AMBULATORY_CARE_PROVIDER_SITE_OTHER): Payer: BC Managed Care – PPO

## 2013-12-01 ENCOUNTER — Ambulatory Visit (INDEPENDENT_AMBULATORY_CARE_PROVIDER_SITE_OTHER): Payer: BC Managed Care – PPO | Admitting: Internal Medicine

## 2013-12-01 VITALS — BP 162/100 | HR 81 | Temp 98.3°F | Resp 16 | Ht 66.0 in | Wt 199.4 lb

## 2013-12-01 DIAGNOSIS — I10 Essential (primary) hypertension: Secondary | ICD-10-CM | POA: Insufficient documentation

## 2013-12-01 DIAGNOSIS — K50118 Crohn's disease of large intestine with other complication: Secondary | ICD-10-CM

## 2013-12-01 DIAGNOSIS — N410 Acute prostatitis: Secondary | ICD-10-CM | POA: Insufficient documentation

## 2013-12-01 DIAGNOSIS — E669 Obesity, unspecified: Secondary | ICD-10-CM

## 2013-12-01 DIAGNOSIS — I152 Hypertension secondary to endocrine disorders: Secondary | ICD-10-CM | POA: Insufficient documentation

## 2013-12-01 DIAGNOSIS — K219 Gastro-esophageal reflux disease without esophagitis: Secondary | ICD-10-CM

## 2013-12-01 LAB — URINALYSIS, ROUTINE W REFLEX MICROSCOPIC
Bilirubin Urine: NEGATIVE
KETONES UR: NEGATIVE
LEUKOCYTES UA: NEGATIVE
NITRITE: NEGATIVE
PH: 7 (ref 5.0–8.0)
SPECIFIC GRAVITY, URINE: 1.015 (ref 1.000–1.030)
Total Protein, Urine: NEGATIVE
Urine Glucose: NEGATIVE
Urobilinogen, UA: 0.2 (ref 0.0–1.0)

## 2013-12-01 LAB — BASIC METABOLIC PANEL
BUN: 16 mg/dL (ref 6–23)
CO2: 28 meq/L (ref 19–32)
Calcium: 9.2 mg/dL (ref 8.4–10.5)
Chloride: 105 mEq/L (ref 96–112)
Creatinine, Ser: 1.1 mg/dL (ref 0.4–1.5)
GFR: 75.49 mL/min (ref >60.00)
Glucose, Bld: 100 mg/dL — ABNORMAL HIGH (ref 70–99)
POTASSIUM: 4.6 meq/L (ref 3.5–5.1)
SODIUM: 139 meq/L (ref 135–145)

## 2013-12-01 LAB — TSH: TSH: 0.91 u[IU]/mL (ref 0.35–4.50)

## 2013-12-01 MED ORDER — AZILSARTAN-CHLORTHALIDONE 40-25 MG PO TABS: 1.0000 | ORAL_TABLET | Freq: Every day | ORAL | Status: DC

## 2013-12-01 MED ORDER — CIPROFLOXACIN HCL 500 MG PO TABS: 500.0000 mg | ORAL_TABLET | Freq: Two times a day (BID) | ORAL | Status: DC

## 2013-12-01 NOTE — Assessment & Plan Note (Signed)
I will check his UA and urine culture Will empirically start cipro

## 2013-12-01 NOTE — Assessment & Plan Note (Signed)
I will check his labs to look for end organ damage and secondary causes of HTN In addition to lifestyle modifications, I have asked him to start Edarbyclor to lower the BP

## 2013-12-01 NOTE — Progress Notes (Signed)
Subjective:    Patient ID: Darryl [name], male    DOB: [birth year], 63 y.o.   MRN: [id number]  Hypertension This is a recurrent problem. The current episode started more than 1 month ago. The problem has been gradually worsening since onset. The problem is uncontrolled. Pertinent negatives include no anxiety, blurred vision, chest pain, headaches, malaise/fatigue, neck pain, orthopnea, palpitations, peripheral edema, PND, shortness of breath or sweats. Past treatments include nothing. The current treatment provides no improvement. Compliance problems include diet and exercise.       Review of Systems  Constitutional: Negative.  Negative for fever, chills, malaise/fatigue, diaphoresis, appetite change and fatigue.  HENT: Negative.   Eyes: Negative.  Negative for blurred vision.  Respiratory: Negative.  Negative for apnea, cough, choking, chest tightness, shortness of breath, wheezing and stridor.   Cardiovascular: Negative.  Negative for chest pain, palpitations, orthopnea, leg swelling and PND.  Gastrointestinal: Negative.  Negative for nausea, vomiting, abdominal pain, diarrhea, constipation and blood in stool.  Endocrine: Negative.   Genitourinary: Positive for dysuria and hematuria. Negative for urgency, frequency, flank pain, decreased urine volume, discharge, penile swelling, scrotal swelling, enuresis, difficulty urinating, genital sores, penile pain and testicular pain.       For several weeks he has had a pain down deep in his lower leg groin with an episode of gross hematuria and dysuria.  Musculoskeletal: Negative.  Negative for arthralgias, back pain, joint swelling, myalgias, neck pain and neck stiffness.  Skin: Negative.  Negative for rash.  Allergic/Immunologic: Negative.   Neurological: Negative.  Negative for headaches.  Hematological: Negative.  Negative for adenopathy. Does not bruise/bleed easily.  Psychiatric/Behavioral: Negative.        Objective:   Physical Exam    Vitals reviewed. Constitutional: He is oriented to person, place, and time. He appears well-developed and well-nourished.  Non-toxic appearance. He does not have a sickly appearance. He does not appear ill. No distress.  HENT:  Head: Normocephalic and atraumatic.  Mouth/Throat: Oropharynx is clear and moist. No oropharyngeal exudate.  Eyes: Conjunctivae are normal. Right eye exhibits no discharge. Left eye exhibits no discharge. No scleral icterus.  Neck: Normal range of motion. Neck supple. No JVD present. No tracheal deviation present. No thyromegaly present.  Cardiovascular: Normal rate, regular rhythm, normal heart sounds and intact distal pulses.  Exam reveals no gallop and no friction rub.   No murmur heard. Pulmonary/Chest: Effort normal and breath sounds normal. No stridor. No respiratory distress. He has no wheezes. He has no rales. He exhibits no tenderness.  Abdominal: Soft. Normal appearance and bowel sounds are normal. He exhibits no shifting dullness, no distension, no pulsatile liver, no fluid wave, no abdominal bruit, no ascites, no pulsatile midline mass and no mass. There is no hepatosplenomegaly, splenomegaly or hepatomegaly. There is no tenderness. There is no rebound, no guarding and no CVA tenderness. No hernia. Hernia confirmed negative in the ventral area, confirmed negative in the right inguinal area and confirmed negative in the left inguinal area.  Genitourinary: Testes normal and penis normal. Rectal exam shows external hemorrhoid. Rectal exam shows no internal hemorrhoid, no fissure, no mass, no tenderness and anal tone normal. Guaiac negative stool. Prostate is enlarged (right lobe shows 1+ enlargement with bogginess). Prostate is not tender. Right testis shows no mass, no swelling and no tenderness. Right testis is descended. Left testis shows no mass, no swelling and no tenderness. Left testis is descended. Circumcised. No penile erythema or penile tenderness. No  discharge  found.  Musculoskeletal: Normal range of motion. He exhibits no edema and no tenderness.  Lymphadenopathy:    He has no cervical adenopathy.       Right: No inguinal adenopathy present.       Left: No inguinal adenopathy present.  Neurological: He is oriented to person, place, and time.  Skin: Skin is warm and dry. No rash noted. He is not diaphoretic. No erythema. No pallor.  Psychiatric: He has a normal mood and affect. His behavior is normal. Judgment and thought content normal.     Lab Results  Component Value Date   WBC 11.5* 09/30/2013   HGB 14.6 09/30/2013   HCT 42.5 09/30/2013   PLT 427.0* 09/30/2013   GLUCOSE 100* 08/11/2013   CHOL 197 05/19/2013   TRIG 81.0 05/19/2013   HDL 62.70 05/19/2013   LDLCALC 118* 05/19/2013   ALT 40 08/11/2013   AST 36 08/11/2013   NA 140 08/11/2013   K 4.3 08/11/2013   CL 107 08/11/2013   CREATININE 1.1 08/11/2013   BUN 17 08/11/2013   CO2 26 08/11/2013   TSH 0.56 05/19/2013   PSA 0.29 05/19/2013   HGBA1C 5.8 05/19/2013       Assessment & Plan:

## 2013-12-01 NOTE — Patient Instructions (Signed)

## 2013-12-01 NOTE — Progress Notes (Signed)
Pre visit review using our clinic review tool, if applicable. No additional management support is needed unless otherwise documented below in the visit note. 

## 2013-12-02 LAB — CULTURE, URINE COMPREHENSIVE
Colony Count: NO GROWTH
Organism ID, Bacteria: NO GROWTH

## 2013-12-03 ENCOUNTER — Other Ambulatory Visit: Payer: Self-pay | Admitting: Gastroenterology

## 2013-12-03 ENCOUNTER — Encounter: Payer: Self-pay | Admitting: Internal Medicine

## 2013-12-20 ENCOUNTER — Telehealth: Payer: Self-pay

## 2013-12-20 NOTE — Telephone Encounter (Signed)
Case ID # [id number].

## 2013-12-20 NOTE — Telephone Encounter (Signed)
Spoke with Shanon Brow at [facility] to get patient's pantoprazole 73m one a day covered.  DNI:DPOE.  It has been approved thru 12/20/2014.  [facility] that it is approved.  Changed the rx'ing Dr. TEdythe ClarityPyrtle instead of GCarlean Purlas listed on the fax from the pharmacy.

## 2013-12-22 ENCOUNTER — Telehealth: Payer: Self-pay | Admitting: Internal Medicine

## 2013-12-22 DIAGNOSIS — I10 Essential (primary) hypertension: Secondary | ICD-10-CM

## 2013-12-22 MED ORDER — AZILSARTAN-CHLORTHALIDONE 40-25 MG PO TABS: 1.0000 | ORAL_TABLET | Freq: Every day | ORAL | Status: DC

## 2013-12-22 NOTE — Telephone Encounter (Signed)
Spoke with pt who is traveling to [facility], [place] sent to requested pharmacy.

## 2013-12-22 NOTE — Telephone Encounter (Signed)
Pt called in said that dr Ronnald Ramp gave him samples of some blood pressure meds, he is now out of town and left them at home. He said that he has not taken for 4 day and will not be back in town to get them.  He wanted to talk to nurse to see what he could do.  Best number [phone]

## 2013-12-23 ENCOUNTER — Telehealth: Payer: Self-pay | Admitting: Internal Medicine

## 2013-12-23 DIAGNOSIS — I10 Essential (primary) hypertension: Secondary | ICD-10-CM

## 2013-12-23 MED ORDER — AZILSARTAN-CHLORTHALIDONE 40-25 MG PO TABS: 1.0000 | ORAL_TABLET | Freq: Every day | ORAL | Status: DC

## 2013-12-23 NOTE — Telephone Encounter (Signed)
Pharmacy is requesting PA for med sent yesterday.  Pharmacy does not want to send PA to our office.  Patient would like pharmacy to be contacted or either something else to be sent that does not require a PA.  Please see phone note from yesterday.

## 2013-12-23 NOTE — Telephone Encounter (Signed)
Rejection received from pharmacy today, called [facility] and advised medication is covered but only at the high copay. Pharmacy can call the help desk at [phone] in order to get an override code to cover meds until MD returns 12-27-13. Pharmacy notified via faxed notification

## 2013-12-23 NOTE — Telephone Encounter (Signed)
Patient called back.  He would like a phone call to put him up to speed to where things are in process.

## 2013-12-23 NOTE — Telephone Encounter (Signed)
Gwenette Greet called from [facility] in [place] stated that she just got off the phone with Mr. [place]such[place] insurance they can give pt a temporary supply and the PA need to be done first. These are the alternative that we might want to consider, no PA needed, Losartan HCTC, Telmisartan/HCTC and Valsartan/HCTC. Please advise.

## 2013-12-26 MED ORDER — VALSARTAN-HYDROCHLOROTHIAZIDE 320-12.5 MG PO TABS: 1.0000 | ORAL_TABLET | Freq: Every day | ORAL | Status: DC

## 2014-01-04 ENCOUNTER — Encounter: Payer: Self-pay | Admitting: Internal Medicine

## 2014-01-04 ENCOUNTER — Ambulatory Visit (INDEPENDENT_AMBULATORY_CARE_PROVIDER_SITE_OTHER): Payer: BC Managed Care – PPO | Admitting: Internal Medicine

## 2014-01-04 VITALS — BP 118/72 | HR 78 | Temp 97.8°F | Resp 16 | Ht 66.0 in | Wt 197.0 lb

## 2014-01-04 DIAGNOSIS — N410 Acute prostatitis: Secondary | ICD-10-CM

## 2014-01-04 DIAGNOSIS — I10 Essential (primary) hypertension: Secondary | ICD-10-CM

## 2014-01-04 MED ORDER — AZILSARTAN-CHLORTHALIDONE 40-25 MG PO TABS: 1.0000 | ORAL_TABLET | Freq: Every day | ORAL | Status: DC

## 2014-01-04 NOTE — Assessment & Plan Note (Signed)
I have asked him to continue Cipro for another month

## 2014-01-04 NOTE — Assessment & Plan Note (Signed)
His BP is well controlled 

## 2014-01-04 NOTE — Progress Notes (Signed)
   Subjective:    Patient ID: Darryl [name], male    DOB: January 12, 1951, 63 y.o.   MRN: [id number]  Hypertension This is a chronic problem. The current episode started more than 1 year ago. The problem has been gradually improving since onset. The problem is controlled. Pertinent negatives include no anxiety, blurred vision, chest pain, headaches, malaise/fatigue, neck pain, orthopnea, palpitations, peripheral edema, PND, shortness of breath or sweats. Past treatments include angiotensin blockers and diuretics. The current treatment provides moderate improvement. Compliance problems include diet and exercise.       Review of Systems  Constitutional: Negative.  Negative for fever, chills, malaise/fatigue, diaphoresis, appetite change and fatigue.  HENT: Negative.   Eyes: Negative.  Negative for blurred vision.  Respiratory: Negative.  Negative for cough, choking, chest tightness, shortness of breath and stridor.   Cardiovascular: Negative.  Negative for chest pain, palpitations, orthopnea, leg swelling and PND.  Gastrointestinal: Negative.  Negative for nausea, vomiting, abdominal pain, diarrhea, constipation and blood in stool.  Endocrine: Negative.   Genitourinary: Positive for dysuria. Negative for urgency, frequency, hematuria, flank pain, decreased urine volume, discharge, penile swelling, scrotal swelling, enuresis, difficulty urinating, genital sores, penile pain and testicular pain.  Musculoskeletal: Negative.  Negative for arthralgias, myalgias and neck pain.  Skin: Negative.   Allergic/Immunologic: Negative.   Neurological: Negative.  Negative for headaches.  Hematological: Negative.  Negative for adenopathy. Does not bruise/bleed easily.  Psychiatric/Behavioral: Negative.        Objective:   Physical Exam   Lab Results  Component Value Date   WBC 11.5* 09/30/2013   HGB 14.6 09/30/2013   HCT 42.5 09/30/2013   PLT 427.0* 09/30/2013   GLUCOSE 100* 12/01/2013   CHOL 197 05/19/2013   TRIG 81.0 05/19/2013   HDL 62.70 05/19/2013   LDLCALC 118* 05/19/2013   ALT 40 08/11/2013   AST 36 08/11/2013   NA 139 12/01/2013   K 4.6 12/01/2013   CL 105 12/01/2013   CREATININE 1.1 12/01/2013   BUN 16 12/01/2013   CO2 28 12/01/2013   TSH 0.91 12/01/2013   PSA 0.29 05/19/2013   HGBA1C 5.8 05/19/2013       Assessment & Plan:

## 2014-01-04 NOTE — Patient Instructions (Signed)

## 2014-01-05 ENCOUNTER — Telehealth: Payer: Self-pay | Admitting: Internal Medicine

## 2014-01-05 NOTE — Telephone Encounter (Signed)
[facility] EMAILED

## 2014-02-09 ENCOUNTER — Other Ambulatory Visit: Payer: Self-pay | Admitting: Internal Medicine

## 2014-03-11 ENCOUNTER — Other Ambulatory Visit: Payer: Self-pay | Admitting: Internal Medicine

## 2014-03-11 DIAGNOSIS — E538 Deficiency of other specified B group vitamins: Secondary | ICD-10-CM

## 2014-03-13 ENCOUNTER — Other Ambulatory Visit: Payer: Self-pay | Admitting: Internal Medicine

## 2014-03-15 LAB — VITAMIN B12: Vitamin B-12: 914 pg/mL — ABNORMAL HIGH (ref 211–911)

## 2014-04-07 ENCOUNTER — Other Ambulatory Visit: Payer: Self-pay | Admitting: Internal Medicine

## 2014-04-18 ENCOUNTER — Ambulatory Visit (INDEPENDENT_AMBULATORY_CARE_PROVIDER_SITE_OTHER): Payer: BC Managed Care – PPO | Admitting: Internal Medicine

## 2014-04-18 ENCOUNTER — Other Ambulatory Visit (INDEPENDENT_AMBULATORY_CARE_PROVIDER_SITE_OTHER): Payer: BC Managed Care – PPO

## 2014-04-18 ENCOUNTER — Encounter: Payer: Self-pay | Admitting: Internal Medicine

## 2014-04-18 VITALS — BP 100/68 | HR 80 | Ht 66.0 in | Wt 200.0 lb

## 2014-04-18 DIAGNOSIS — M129 Arthropathy, unspecified: Secondary | ICD-10-CM

## 2014-04-18 DIAGNOSIS — K5010 Crohn's disease of large intestine without complications: Secondary | ICD-10-CM

## 2014-04-18 DIAGNOSIS — K529 Noninfective gastroenteritis and colitis, unspecified: Secondary | ICD-10-CM

## 2014-04-18 DIAGNOSIS — H538 Other visual disturbances: Secondary | ICD-10-CM

## 2014-04-18 DIAGNOSIS — K6389 Other specified diseases of intestine: Secondary | ICD-10-CM

## 2014-04-18 DIAGNOSIS — Z79899 Other long term (current) drug therapy: Secondary | ICD-10-CM

## 2014-04-18 LAB — CBC WITH DIFFERENTIAL/PLATELET
Basophils Absolute: 0 10*3/uL (ref 0.0–0.1)
Basophils Relative: 0.3 % (ref 0.0–3.0)
EOS ABS: 0 10*3/uL (ref 0.0–0.7)
EOS PCT: 0.6 % (ref 0.0–5.0)
HCT: 38.7 % — ABNORMAL LOW (ref 39.0–52.0)
Hemoglobin: 13.5 g/dL (ref 13.0–17.0)
LYMPHS PCT: 27 % (ref 12.0–46.0)
Lymphs Abs: 1.8 10*3/uL (ref 0.7–4.0)
MCHC: 34.8 g/dL (ref 30.0–36.0)
MCV: 102.8 fl — ABNORMAL HIGH (ref 78.0–100.0)
Monocytes Absolute: 0.5 10*3/uL (ref 0.1–1.0)
Monocytes Relative: 7.1 % (ref 3.0–12.0)
NEUTROS ABS: 4.3 10*3/uL (ref 1.4–7.7)
NEUTROS PCT: 65 % (ref 43.0–77.0)
Platelets: 298 10*3/uL (ref 150.0–400.0)
RBC: 3.76 Mil/uL — ABNORMAL LOW (ref 4.22–5.81)
RDW: 14.2 % (ref 11.5–15.5)
WBC: 6.7 10*3/uL (ref 4.0–10.5)

## 2014-04-18 LAB — HEPATIC FUNCTION PANEL
ALBUMIN: 4.2 g/dL (ref 3.5–5.2)
ALK PHOS: 73 U/L (ref 39–117)
ALT: 32 U/L (ref 0–53)
AST: 24 U/L (ref 0–37)
BILIRUBIN DIRECT: 0.1 mg/dL (ref 0.0–0.3)
Total Bilirubin: 0.5 mg/dL (ref 0.2–1.2)
Total Protein: 7.1 g/dL (ref 6.0–8.3)

## 2014-04-18 MED ORDER — TRAMADOL HCL 50 MG PO TABS: 50.0000 mg | ORAL_TABLET | Freq: Four times a day (QID) | ORAL | Status: DC | PRN

## 2014-04-18 MED ORDER — MERCAPTOPURINE 50 MG PO TABS: ORAL_TABLET | ORAL | Status: DC

## 2014-04-18 NOTE — Progress Notes (Signed)
Subjective:    Patient ID: Darryl [name], male    DOB: [birth year], 64 y.o.   MRN: [id number]  HPI Darryl [name] is a 64 yo male with PMH of Crohn's colitis for over 20 years currently managed on 6-MP, GERD who is seen in office followup. He is here alone today and was last seen in May 2015. He is maintained on 6-MP 100 mg alternating with 75 mg daily. He reports no recent bowel complaints. Bowel movements have been formed and regular without blood in his stool or melena. No abdominal pain. Good control of heartburn. He has been treated for what sounds like prostatitis on and off for the last several months until 2 months of antibiotics which helped with his symptoms. He denies any change in bowel movement or habit with antibiotics.  He has noticed new joint pains which are limiting his ability to exercise. This is primarily his hips and knees. He describes this as an aching feeling. He is using Aleve on occasion to help with this discomfort. He also has noticed more blurry vision recently. He reports a history of corneal dystrophy and follows with ophthalmology though he has not seen them recently. He denies eye pain. Denies oral ulcers. Has issues with eczema but denies new rash.  Reminder: colitis flare when stopping 6-MP several years ago.  Review of Systems As per history of present illness, otherwise negative  Current Medications, Allergies, Past Medical History, Past Surgical History, Family History and Social History were reviewed in [employer] record.     Objective:   Physical Exam BP 100/68 mmHg  Pulse 80  Ht 5' 6"  (1.676 m)  Wt 200 lb (90.719 kg)  BMI 32.30 kg/m2 Constitutional: Well-developed and well-nourished. No distress. HEENT: Normocephalic and atraumatic. Oropharynx is clear and moist. No oropharyngeal exudate. Conjunctivae are normal.  No scleral icterus. Neck: Neck supple. Trachea midline. Cardiovascular: Normal rate, regular rhythm and intact  distal pulses. No M/R/G Pulmonary/chest: Effort normal and breath sounds normal. No wheezing, rales or rhonchi. Abdominal: Soft, nontender, nondistended. Bowel sounds active throughout. There are no masses palpable. No hepatosplenomegaly. Extremities: no clubbing, cyanosis, or edema Lymphadenopathy: No cervical adenopathy noted. Neurological: Alert and oriented to person place and time. Skin: Skin is warm and dry. Diffuse dry skin with eczematous flaking scale Psychiatric: Normal mood and affect. Behavior is normal.  CBC    Component Value Date/Time   WBC 11.5* 09/30/2013 1550   RBC 4.11* 09/30/2013 1550   HGB 14.6 09/30/2013 1550   HCT 42.5 09/30/2013 1550   PLT 427.0* 09/30/2013 1550   MCV 103.6* 09/30/2013 1550   MCHC 34.2 09/30/2013 1550   RDW 13.3 09/30/2013 1550   LYMPHSABS 2.6 09/30/2013 1550   MONOABS 1.0 09/30/2013 1550   EOSABS 0.0 09/30/2013 1550   BASOSABS 0.1 09/30/2013 1550    CMP     Component Value Date/Time   NA 139 12/01/2013 1215   K 4.6 12/01/2013 1215   CL 105 12/01/2013 1215   CO2 28 12/01/2013 1215   GLUCOSE 100* 12/01/2013 1215   BUN 16 12/01/2013 1215   CREATININE 1.1 12/01/2013 1215   CALCIUM 9.2 12/01/2013 1215   PROT 6.9 08/11/2013 0933   ALBUMIN 4.0 08/11/2013 0933   AST 36 08/11/2013 0933   ALT 40 08/11/2013 0933   ALKPHOS 64 08/11/2013 0933   BILITOT 0.9 08/11/2013 0933   GFRNONAA 72.63 04/27/2009 0848   GFRAA 99 02/12/2008 0935    Colonoscopy 2013 reviewed  Assessment & Plan:  64 yo male with PMH of Crohn's colitis for over 20 years currently managed on 6-MP, GERD who is seen in office followup.  1. Crohn's colitis -- clinically in remission on 6-MP. Continue current dose of 6-MP. Check CBC and hepatic function panel. Surveillance colonoscopy recommended. We discussed the test including the risks and benefits and he is agreeable to proceed.  2. Joint pains -- possibly IBD-related arthropathy. I have recommended a rheumatology  referral to see Dr. Amil Amen to exclude other forms of autoimmune joint disease. If pain continues we could consider change in therapy with the addition of a biologic. I would likely continue 6-MP for several months while starting biologic if this becomes necessary with the ultimate plan to drop 6-MP. Tramadol 50 mg every 6 hours when necessary. To be used sparingly.  3.  Blurry vision -- I recommended he contact his ophthalmologist for a visit. Specifically to evaluate blurry vision and exclude iritis, uveitis or other inflammation that could be Crohn's related  1 yr followup, sooner PRN

## 2014-04-18 NOTE — Patient Instructions (Signed)
Your physician has requested that you go to the basement for the following lab work before leaving today: CBC, Hepatic function panel  We have sent the following medications to your pharmacy for you to pick up at your convenience: 6 mp Tramadol  Please see your eye doctor.  We are in the process of scheduling an appointment for you to see Dr Amil Amen for your joint pains. He is located at [facility]. Phone number, [phone]. We will call you once we get a time and date for appointment.  CC:Dr Scarlette Calico

## 2014-04-20 ENCOUNTER — Telehealth: Payer: Self-pay | Admitting: *Deleted

## 2014-04-20 NOTE — Telephone Encounter (Signed)
I have spoken to patient to advise that Dr Melissa Noon office has scheduled an appointment for him on 05/17/14 @ 9:00 am. I have given him their contact information and location as well as instructions regarding what to bring for appointment. He verbalizes understanding.

## 2014-05-05 ENCOUNTER — Other Ambulatory Visit: Payer: Self-pay | Admitting: Internal Medicine

## 2014-05-16 ENCOUNTER — Telehealth: Payer: Self-pay

## 2014-05-16 NOTE — Telephone Encounter (Signed)
Called [facility] to do a prior authorization # [id number] for pantoprazole 12m.  Patient ID# [id number]  Patient already had prior authorization in place good thru 11/2014 but has apparently changed insurance .  The rep was able to transfer the authorization over to his current insurance.  Case # [id number]  [employer]ified.

## 2014-05-20 ENCOUNTER — Encounter: Payer: Self-pay | Admitting: Internal Medicine

## 2014-05-20 NOTE — Telephone Encounter (Signed)
erorr

## 2014-06-10 ENCOUNTER — Other Ambulatory Visit: Payer: Self-pay | Admitting: Internal Medicine

## 2014-08-16 ENCOUNTER — Other Ambulatory Visit: Payer: Self-pay | Admitting: Internal Medicine

## 2014-09-19 ENCOUNTER — Other Ambulatory Visit: Payer: Self-pay

## 2014-10-11 ENCOUNTER — Other Ambulatory Visit: Payer: Self-pay | Admitting: Internal Medicine

## 2014-10-17 ENCOUNTER — Other Ambulatory Visit: Payer: Self-pay | Admitting: Internal Medicine

## 2014-10-23 ENCOUNTER — Other Ambulatory Visit: Payer: Self-pay | Admitting: Internal Medicine

## 2014-11-08 ENCOUNTER — Other Ambulatory Visit: Payer: Self-pay | Admitting: Internal Medicine

## 2014-11-09 ENCOUNTER — Other Ambulatory Visit: Payer: Self-pay | Admitting: Internal Medicine

## 2014-11-20 ENCOUNTER — Encounter: Payer: Self-pay | Admitting: Internal Medicine

## 2015-02-01 ENCOUNTER — Other Ambulatory Visit: Payer: Self-pay | Admitting: Internal Medicine

## 2015-02-07 ENCOUNTER — Other Ambulatory Visit: Payer: Self-pay | Admitting: Internal Medicine

## 2015-02-08 ENCOUNTER — Ambulatory Visit (AMBULATORY_SURGERY_CENTER): Payer: Self-pay | Admitting: *Deleted

## 2015-02-08 VITALS — Ht 66.5 in | Wt 198.6 lb

## 2015-02-08 DIAGNOSIS — Z1211 Encounter for screening for malignant neoplasm of colon: Secondary | ICD-10-CM

## 2015-02-08 MED ORDER — NA SULFATE-K SULFATE-MG SULF 17.5-3.13-1.6 GM/177ML PO SOLN: ORAL | Status: DC

## 2015-02-08 NOTE — Progress Notes (Signed)
No allergies to eggs or soy. No problems with anesthesia.  Pt given Emmi instructions for colonoscopy  No oxygen use  No diet drug use  

## 2015-02-14 ENCOUNTER — Other Ambulatory Visit: Payer: Self-pay | Admitting: Internal Medicine

## 2015-02-20 LAB — HM COLONOSCOPY

## 2015-02-22 ENCOUNTER — Encounter: Payer: Self-pay | Admitting: Internal Medicine

## 2015-02-22 ENCOUNTER — Ambulatory Visit (AMBULATORY_SURGERY_CENTER): Payer: Medicare Other | Admitting: Internal Medicine

## 2015-02-22 VITALS — BP 90/56 | HR 79 | Temp 97.7°F | Resp 39 | Ht 66.5 in | Wt 198.0 lb

## 2015-02-22 DIAGNOSIS — Z1211 Encounter for screening for malignant neoplasm of colon: Secondary | ICD-10-CM

## 2015-02-22 DIAGNOSIS — K5010 Crohn's disease of large intestine without complications: Secondary | ICD-10-CM | POA: Diagnosis not present

## 2015-02-22 DIAGNOSIS — D123 Benign neoplasm of transverse colon: Secondary | ICD-10-CM | POA: Diagnosis not present

## 2015-02-22 MED ORDER — SODIUM CHLORIDE 0.9 % IV SOLN: 500.0000 mL | INTRAVENOUS | Status: DC

## 2015-02-22 NOTE — Progress Notes (Signed)
Called to room to assist during endoscopic procedure.  Patient ID and intended procedure confirmed with present staff. Received instructions for my participation in the procedure from the performing physician.  

## 2015-02-22 NOTE — Op Note (Signed)
[facility]  [address] [place], [id number]   COLONOSCOPY PROCEDURE REPORT  PATIENT: Darryl [name], Darryl [name]  MR#: [id number] BIRTHDATE: 1950-07-21 , 78  yrs. old GENDER: male ENDOSCOPIST: Jerene Bears, MD PROCEDURE DATE:  02/22/2015 PROCEDURE:   Colonoscopy, surveillance , Colonoscopy with cold biopsy polypectomy, and Colonoscopy with biopsy First Screening Colonoscopy - Avg.  risk and is 50 yrs.  old or older - No.  Prior Negative Screening - Now for repeat screening. N/A  History of Adenoma - Now for follow-up colonoscopy & has been > or = to 3 yrs.  N/A  Polyps removed today? Yes ASA CLASS:   Class III INDICATIONS:  long-standing Crohn's colitis for surveillance, last colonoscopy 2013 with Dr. Sharlett Iles. MEDICATIONS: Monitored anesthesia care and Propofol 250 mg IV  DESCRIPTION OF PROCEDURE:   After the risks benefits and alternatives of the procedure were thoroughly explained, informed consent was obtained.  The digital rectal exam revealed no rectal mass.   The LB [id number]  endoscope was introduced through the anus and advanced to the cecum, which was identified by both the appendix and ileocecal valve. No adverse events experienced. The quality of the prep was good.  (Suprep was used)  The instrument was then slowly withdrawn as the colon was fully examined. Estimated blood loss is zero unless otherwise noted in this procedure report.  COLON FINDINGS: A sessile polyp measuring 3 mm in size was found in the transverse colon.  A polypectomy was performed with cold forceps.  The resection was complete, the polyp tissue was completely retrieved and sent to histology.   Evidence of scarring in the colonic mucosa particularly in the ascending and transverse colon.  The mucosa appeared normal without evidence of active Crohn's disease.  Multiple surveillance biopsies were performed every 8-10 cm in 4 quadrant fashion throughout the colon. Retroflexed views  revealed no abnormalities. The time to cecum = 2.4 Withdrawal time = 13.9   The scope was withdrawn and the procedure completed.  COMPLICATIONS: There were no immediate complications.  ENDOSCOPIC IMPRESSION: 1.   Sessile polyp was found in the transverse colon; polypectomy was performed with cold forceps 2.   Evidence of scarring in the colonic mucosa particularly in the ascending and transverse colon.  The mucosa appeared normal without evidence of active Crohn's disease.  Surveillance biopsies performed   RECOMMENDATIONS: 1.  Await pathology results 2.  Continue current medications 3.  Repeat Colonoscopy in 2 years. 4.  You will receive a letter within 1-2 weeks with the results of your biopsy as well as final recommendations.  Please call my office if you have not received a letter after 3 weeks.  eSigned:  Jerene Bears, MD 02/22/2015 8:34 AM   cc: Janith Lima, MD and The Patient   PATIENT NAME:  Darryl [name], Darryl [name] MR#: [id number]

## 2015-02-22 NOTE — Progress Notes (Signed)
Patient awakening,vss,report to rn 

## 2015-02-22 NOTE — Patient Instructions (Signed)
YOU HAD AN ENDOSCOPIC PROCEDURE TODAY AT [facility]:   Refer to the procedure report that was given to you for any specific questions about what was found during the examination.  If the procedure report does not answer your questions, please call your gastroenterologist to clarify.  If you requested that your care partner not be given the details of your procedure findings, then the procedure report has been included in a sealed envelope for you to review at your convenience later.  YOU SHOULD EXPECT: Some feelings of bloating in the abdomen. Passage of more gas than usual.  Walking can help get rid of the air that was put into your GI tract during the procedure and reduce the bloating. If you had a lower endoscopy (such as a colonoscopy or flexible sigmoidoscopy) you may notice spotting of blood in your stool or on the toilet paper. If you underwent a bowel prep for your procedure, you may not have a normal bowel movement for a few days.  Please Note:  You might notice some irritation and congestion in your nose or some drainage.  This is from the oxygen used during your procedure.  There is no need for concern and it should clear up in a day or so.  SYMPTOMS TO REPORT IMMEDIATELY:   Following lower endoscopy (colonoscopy or flexible sigmoidoscopy):  Excessive amounts of blood in the stool  Significant tenderness or worsening of abdominal pains  Swelling of the abdomen that is new, acute  Fever of 100F or higher   Black, tarry-looking stools  For urgent or emergent issues, a gastroenterologist can be reached at any hour by calling [phone].   DIET: Your first meal following the procedure should be a small meal and then it is ok to progress to your normal diet. Heavy or fried foods are harder to digest and may make you feel nauseous or bloated.  Likewise, meals heavy in dairy and vegetables can increase bloating.  Drink plenty of fluids but you should avoid alcoholic  beverages for 24 hours.  ACTIVITY:  You should plan to take it easy for the rest of today and you should NOT DRIVE or use heavy machinery until tomorrow (because of the sedation medicines used during the test).    FOLLOW UP: Our staff will call the number listed on your records the next business day following your procedure to check on you and address any questions or concerns that you may have regarding the information given to you following your procedure. If we do not reach you, we will leave a message.  However, if you are feeling well and you are not experiencing any problems, there is no need to return our call.  We will assume that you have returned to your regular daily activities without incident.  If any biopsies were taken you will be contacted by phone or by letter within the next 1-3 weeks.  Please call us at [phone] if you have not heard about the biopsies in 3 weeks.    SIGNATURES/CONFIDENTIALITY: You and/or your care partner have signed paperwork which will be entered into your electronic medical record.  These signatures attest to the fact that that the information above on your After Visit Summary has been reviewed and is understood.  Full responsibility of the confidentiality of this discharge information lies with you and/or your care-partner.  Polyp information given.

## 2015-02-23 ENCOUNTER — Telehealth: Payer: Self-pay | Admitting: *Deleted

## 2015-02-23 NOTE — Telephone Encounter (Signed)
Number identifier, left message, follow-up

## 2015-02-27 ENCOUNTER — Encounter: Payer: Self-pay | Admitting: Internal Medicine

## 2015-02-28 NOTE — Addendum Note (Signed)
Addended by: Janith Lima on: 02/28/2015 01:46 PM   Modules accepted: Miquel Dunn

## 2015-03-22 ENCOUNTER — Other Ambulatory Visit: Payer: Self-pay | Admitting: Internal Medicine

## 2015-03-31 ENCOUNTER — Encounter: Payer: Self-pay | Admitting: Internal Medicine

## 2015-03-31 ENCOUNTER — Ambulatory Visit (INDEPENDENT_AMBULATORY_CARE_PROVIDER_SITE_OTHER): Payer: Medicare Other | Admitting: Internal Medicine

## 2015-03-31 VITALS — BP 130/82 | HR 106 | Temp 98.0°F | Ht 66.0 in | Wt 194.0 lb

## 2015-03-31 DIAGNOSIS — I10 Essential (primary) hypertension: Secondary | ICD-10-CM | POA: Diagnosis not present

## 2015-03-31 DIAGNOSIS — J45901 Unspecified asthma with (acute) exacerbation: Secondary | ICD-10-CM

## 2015-03-31 DIAGNOSIS — R05 Cough: Secondary | ICD-10-CM | POA: Diagnosis not present

## 2015-03-31 DIAGNOSIS — R7309 Other abnormal glucose: Secondary | ICD-10-CM | POA: Diagnosis not present

## 2015-03-31 DIAGNOSIS — R059 Cough, unspecified: Secondary | ICD-10-CM | POA: Insufficient documentation

## 2015-03-31 MED ORDER — METHYLPREDNISOLONE ACETATE 80 MG/ML IJ SUSP
80.0000 mg | Freq: Once | INTRAMUSCULAR | Status: AC
  Administered 2015-03-31: 80 mg via INTRAMUSCULAR

## 2015-03-31 MED ORDER — LEVOFLOXACIN 500 MG PO TABS: 500.0000 mg | ORAL_TABLET | Freq: Every day | ORAL | Status: DC

## 2015-03-31 MED ORDER — PREDNISONE 10 MG PO TABS: ORAL_TABLET | ORAL | Status: DC

## 2015-03-31 NOTE — Patient Instructions (Signed)
You had the steroid shot today  Please take all new medication as prescribed - the antibiotic and prednisone  OK to continue the Robitussin DM for cough  Please continue all other medications as before, and refills have been done if requested.  Please have the pharmacy call with any other refills you may need.  Please keep your appointments with your specialists as you may have planned

## 2015-03-31 NOTE — Progress Notes (Signed)
Pre visit review using our clinic review tool, if applicable. No additional management support is needed unless otherwise documented below in the visit note. 

## 2015-03-31 NOTE — Progress Notes (Signed)
Subjective:    Patient ID: Darryl [name], male    DOB: [birth year], 65 y.o.   MRN: [id number]  HPI  Here with acute onset mild to mod 2 wks  ST, HA, general weakness and malaise, with prod cough greenish sputum, but Pt denies chest pain, increased sob or doe, wheezing, orthopnea, PND, increased LE swelling, palpitations, dizziness or syncope.  Pt denies new neurological symptoms such as new headache, or facial or extremity weakness or numbness   Pt denies polydipsia, polyuria, Past Medical History  Diagnosis Date  . GERD (gastroesophageal reflux disease)   . Vitamin B12 deficiency   . Lateral epicondylitis  of elbow   . Exostosis of unspecified site   . Nontraumatic rupture of patellar tendon   . Acute upper respiratory infections of unspecified site   . Dizziness and giddiness   . Allergic rhinitis, cause unspecified   . Regional enteritis of unspecified site   . Obesity, unspecified   . Asthma   . Hyperplastic colon polyp 02/26/2008  . Crohn's colitis ([place])   . Cataract   . Hypertension    Past Surgical History  Procedure Laterality Date  . Tonsillectomy and adenoidectomy      reports that he has never smoked. He has never used smokeless tobacco. He reports that he drinks alcohol. He reports that he does not use illicit drugs. family history includes Diabetes in his brother and father; Heart attack in his father; Heart disease in his father; Hypertension in his father. There is no history of Colon cancer, Stomach cancer, Hyperlipidemia, Sudden death, Cancer, COPD, Alcohol abuse, Drug abuse, Stroke, Rectal cancer, or Esophageal cancer. Allergies  Allergen Reactions  . Asacol [Mesalamine] Hives    Trouble swallowing, loss of appetite  . Amoxicillin Rash  . Sulfamethoxazole Rash   Current Outpatient Prescriptions on File Prior to Visit  Medication Sig Dispense Refill  . ADVAIR HFA 115-21 MCG/ACT inhaler USE 2 PUFFS TWICE DAILY AS NEEDED. SHAKE WELL 12 g 11  .  Azilsartan-Chlorthalidone (EDARBYCLOR) 40-25 MG TABS Take 1 tablet by mouth daily. 30 tablet 11  . fluticasone (FLONASE) 50 MCG/ACT nasal spray Place 1 spray into the nose daily. 16 g 6  . folic acid (FOLVITE) 1 MG tablet TAKE 1 TABLET ONCE DAILY. 90 tablet 1  . mercaptopurine (PURINETHOL) 50 MG tablet TAKE 1&1/2 TABLETS EVERY OTHER DAY ALTERNATING WITH 2 TABS EVERY OTHER DAY 90 tablet 1  . montelukast (SINGULAIR) 10 MG tablet TAKE ONE TABLET AT BEDTIME. 90 tablet 3  . Multiple Vitamin (MULTIVITAMIN) tablet Take 1 tablet by mouth daily.    . pantoprazole (PROTONIX) 40 MG tablet TAKE 1 TABLET ONCE DAILY. 30 tablet 3  . POTASSIUM CITRATE PO Take by mouth daily.    . traMADol (ULTRAM) 50 MG tablet Take 1 tablet (50 mg total) by mouth every 6 (six) hours as needed. 30 tablet 1  . VENTOLIN HFA 108 (90 BASE) MCG/ACT inhaler USE 2 PUFFS EVERY 6 HOURS AS NEEDED FOR WHEEZING. **SHAKE WELL** 18 g 11  . vitamin B-12 (CYANOCOBALAMIN) 1000 MCG tablet Take 1 tablet (1,000 mcg total) by mouth daily.     No current facility-administered medications on file prior to visit.   Review of Systems  Constitutional: Negative for unusual diaphoresis or night sweats HENT: Negative for ringing in ear or discharge Eyes: Negative for double vision or worsening visual disturbance.  Respiratory: Negative for choking and stridor.   Gastrointestinal: Negative for vomiting or other signifcant bowel change Genitourinary: Negative  for hematuria or change in urine volume.  Musculoskeletal: Negative for other MSK pain or swelling Skin: Negative for color change and worsening wound.  Neurological: Negative for tremors and numbness other than noted  Psychiatric/Behavioral: Negative for decreased concentration or agitation other than above       Objective:   Physical Exam BP 130/82 mmHg  Pulse 106  Temp(Src) 98 F (36.7 C) (Oral)  Ht 5' 6"  (1.676 m)  Wt 194 lb (87.998 kg)  BMI 31.33 kg/m2  SpO2 95% VS noted, mild  ill Constitutional: Pt appears in no significant distress HENT: Head: NCAT.  Right Ear: External ear normal.  Left Ear: External ear normal.  Bilat tm's with mild erythema.  Max sinus areas mild tender.  Pharynx with mild erythema, no exudate Eyes: . Pupils are equal, round, and reactive to light. Conjunctivae and EOM are normal Neck: Normal range of motion. Neck supple.  Cardiovascular: Normal rate and regular rhythm.   Pulmonary/Chest: Effort normal and breath sounds decreased without rales but with scattered few wheezing.  Neurological: Pt is alert. Not confused , motor grossly intact Skin: Skin is warm. No rash, no LE edema Psychiatric: Pt behavior is normal. No agitation.     Assessment & Plan:

## 2015-04-01 NOTE — Assessment & Plan Note (Signed)
Lab Results  Component Value Date   HGBA1C 5.8 05/19/2013   stable overall by history and exam, recent data reviewed with pt, and pt to continue medical treatment as before,  to f/u any worsening symptoms or concerns

## 2015-04-01 NOTE — Assessment & Plan Note (Signed)
Mild to mod, for depomedrol IM, predpac asd, to f/u any worsening symptoms or concerns 

## 2015-04-01 NOTE — Assessment & Plan Note (Addendum)
stable overall by history and exam, recent data reviewed with pt, and pt to continue medical treatment as before,  to f/u any worsening symptoms or concerns BP Readings from Last 3 Encounters:  03/31/15 130/82  02/22/15 90/56  04/18/14 100/68

## 2015-04-01 NOTE — Assessment & Plan Note (Signed)
Mild to mod, c/w bronchitis vs pna, delcines cxr, for antibx course,  Cough med prn, to f/u any worsening symptoms or concerns

## 2015-05-02 ENCOUNTER — Other Ambulatory Visit: Payer: Self-pay | Admitting: Internal Medicine

## 2015-06-09 ENCOUNTER — Other Ambulatory Visit: Payer: Self-pay | Admitting: Internal Medicine

## 2015-06-20 ENCOUNTER — Other Ambulatory Visit: Payer: Self-pay | Admitting: Internal Medicine

## 2015-07-03 ENCOUNTER — Other Ambulatory Visit: Payer: Self-pay | Admitting: Internal Medicine

## 2015-07-03 NOTE — Telephone Encounter (Signed)
Dr Hilarie Fredrickson- Patient is on mercaptopurine and would like refills. Last office visit 03/2014. I advised that he was due for follow up back in March but no appt was made. At the current time, we only have office appointments on 3 dates, all of which are when patient will be in [place]. His last labs were completed 03/2014. Would you like me to get labs on him before refilling rx?

## 2015-07-04 ENCOUNTER — Encounter: Payer: Self-pay | Admitting: Internal Medicine

## 2015-07-04 ENCOUNTER — Ambulatory Visit (INDEPENDENT_AMBULATORY_CARE_PROVIDER_SITE_OTHER): Payer: Medicare Other | Admitting: Internal Medicine

## 2015-07-04 ENCOUNTER — Other Ambulatory Visit (INDEPENDENT_AMBULATORY_CARE_PROVIDER_SITE_OTHER): Payer: Medicare Other

## 2015-07-04 VITALS — BP 112/70 | HR 78 | Temp 97.8°F | Resp 16 | Ht 66.0 in | Wt 194.0 lb

## 2015-07-04 DIAGNOSIS — Z23 Encounter for immunization: Secondary | ICD-10-CM | POA: Diagnosis not present

## 2015-07-04 DIAGNOSIS — D519 Vitamin B12 deficiency anemia, unspecified: Secondary | ICD-10-CM

## 2015-07-04 DIAGNOSIS — R351 Nocturia: Secondary | ICD-10-CM

## 2015-07-04 DIAGNOSIS — E785 Hyperlipidemia, unspecified: Secondary | ICD-10-CM | POA: Insufficient documentation

## 2015-07-04 DIAGNOSIS — N401 Enlarged prostate with lower urinary tract symptoms: Secondary | ICD-10-CM | POA: Diagnosis not present

## 2015-07-04 DIAGNOSIS — Z0001 Encounter for general adult medical examination with abnormal findings: Secondary | ICD-10-CM

## 2015-07-04 DIAGNOSIS — F40243 Fear of flying: Secondary | ICD-10-CM | POA: Diagnosis not present

## 2015-07-04 DIAGNOSIS — R7309 Other abnormal glucose: Secondary | ICD-10-CM | POA: Diagnosis not present

## 2015-07-04 DIAGNOSIS — Z0000 Encounter for general adult medical examination without abnormal findings: Secondary | ICD-10-CM

## 2015-07-04 DIAGNOSIS — K219 Gastro-esophageal reflux disease without esophagitis: Secondary | ICD-10-CM | POA: Diagnosis not present

## 2015-07-04 DIAGNOSIS — I10 Essential (primary) hypertension: Secondary | ICD-10-CM

## 2015-07-04 DIAGNOSIS — E1169 Type 2 diabetes mellitus with other specified complication: Secondary | ICD-10-CM | POA: Insufficient documentation

## 2015-07-04 DIAGNOSIS — J45901 Unspecified asthma with (acute) exacerbation: Secondary | ICD-10-CM

## 2015-07-04 HISTORY — DX: Hyperlipidemia, unspecified: E78.5

## 2015-07-04 LAB — COMPREHENSIVE METABOLIC PANEL
ALBUMIN: 4.2 g/dL (ref 3.5–5.2)
ALK PHOS: 61 U/L (ref 39–117)
ALT: 51 U/L (ref 0–53)
AST: 29 U/L (ref 0–37)
BUN: 24 mg/dL — AB (ref 6–23)
CALCIUM: 10 mg/dL (ref 8.4–10.5)
CHLORIDE: 104 meq/L (ref 96–112)
CO2: 24 mEq/L (ref 19–32)
Creatinine, Ser: 1.44 mg/dL (ref 0.40–1.50)
GFR: 52.17 mL/min — AB (ref >60.00)
Glucose, Bld: 125 mg/dL — ABNORMAL HIGH (ref 70–99)
POTASSIUM: 4.5 meq/L (ref 3.5–5.1)
SODIUM: 138 meq/L (ref 135–145)
TOTAL PROTEIN: 7.1 g/dL (ref 6.0–8.3)
Total Bilirubin: 0.6 mg/dL (ref 0.2–1.2)

## 2015-07-04 LAB — CBC WITH DIFFERENTIAL/PLATELET
Basophils Absolute: 0 10*3/uL (ref 0.0–0.1)
Basophils Relative: 0.3 % (ref 0.0–3.0)
EOS ABS: 0 10*3/uL (ref 0.0–0.7)
EOS PCT: 0.4 % (ref 0.0–5.0)
HCT: 39.1 % (ref 39.0–52.0)
Hemoglobin: 13.5 g/dL (ref 13.0–17.0)
LYMPHS ABS: 1.5 10*3/uL (ref 0.7–4.0)
Lymphocytes Relative: 27.9 % (ref 12.0–46.0)
MCHC: 34.5 g/dL (ref 30.0–36.0)
MCV: 104.3 fl — ABNORMAL HIGH (ref 78.0–100.0)
MONO ABS: 0.4 10*3/uL (ref 0.1–1.0)
Monocytes Relative: 7.9 % (ref 3.0–12.0)
NEUTROS PCT: 63.5 % (ref 43.0–77.0)
Neutro Abs: 3.4 10*3/uL (ref 1.4–7.7)
Platelets: 301 10*3/uL (ref 150.0–400.0)
RBC: 3.75 Mil/uL — AB (ref 4.22–5.81)
RDW: 15.2 % (ref 11.5–15.5)
WBC: 5.4 10*3/uL (ref 4.0–10.5)

## 2015-07-04 LAB — VITAMIN B12: Vitamin B-12: 845 pg/mL (ref 211–911)

## 2015-07-04 LAB — URINALYSIS, ROUTINE W REFLEX MICROSCOPIC
BILIRUBIN URINE: NEGATIVE
Hgb urine dipstick: NEGATIVE
Ketones, ur: NEGATIVE
Leukocytes, UA: NEGATIVE
Nitrite: NEGATIVE
PH: 6 (ref 5.0–8.0)
RBC / HPF: NONE SEEN (ref 0–?)
Specific Gravity, Urine: 1.015 (ref 1.000–1.030)
TOTAL PROTEIN, URINE-UPE24: NEGATIVE
Urine Glucose: NEGATIVE
Urobilinogen, UA: 0.2 (ref 0.0–1.0)

## 2015-07-04 LAB — LIPID PANEL
CHOLESTEROL: 225 mg/dL — AB (ref 0–200)
HDL: 59.5 mg/dL (ref >39.00)
LDL CALC: 138 mg/dL — AB (ref 0–99)
NonHDL: 165.48
Total CHOL/HDL Ratio: 4
Triglycerides: 135 mg/dL (ref 0.0–149.0)
VLDL: 27 mg/dL (ref 0.0–40.0)

## 2015-07-04 LAB — PSA: PSA: 0.41 ng/mL (ref 0.10–4.00)

## 2015-07-04 LAB — HEMOGLOBIN A1C: HEMOGLOBIN A1C: 6.3 % (ref 4.6–6.5)

## 2015-07-04 LAB — FOLATE: Folate: >23.7 ng/mL (ref >5.9)

## 2015-07-04 LAB — TSH: TSH: 1.29 u[IU]/mL (ref 0.35–4.50)

## 2015-07-04 LAB — FECAL OCCULT BLOOD, GUAIAC: FECAL OCCULT BLD: NEGATIVE

## 2015-07-04 MED ORDER — ZOLPIDEM TARTRATE 10 MG PO TABS: 10.0000 mg | ORAL_TABLET | Freq: Every evening | ORAL | Status: DC | PRN

## 2015-07-04 MED ORDER — ALBUTEROL SULFATE HFA 108 (90 BASE) MCG/ACT IN AERS: 2.0000 | INHALATION_SPRAY | Freq: Four times a day (QID) | RESPIRATORY_TRACT | Status: DC | PRN

## 2015-07-04 NOTE — Progress Notes (Signed)
Pre visit review using our clinic review tool, if applicable. No additional management support is needed unless otherwise documented below in the visit note. 

## 2015-07-04 NOTE — Progress Notes (Signed)
Subjective:  Patient ID: Darryl [name], male    DOB: April 27, 1950  Age: 65 y.o. MRN: [id number]  CC: Annual Exam; Hyperlipidemia; Anemia; and Gastroesophageal Reflux   HPI Darryl [name] presents for a CPX and Follow-up on multiple medical problems. He tells me that he plays golf several times a week and walks the course, he does not experience any chest pain, shortness of breath, dyspnea on exertion, or fatigue. He reports that his blood pressures well controlled on the combination of an ARB/hydrochlorothiazide.  He has a history of asthma and rarely has wheezing and when he does it's well controlled with Singulair, Advair Diskus, and Ventolin. He has gastroesophageal reflux disease in its well-controlled with a proton pump inhibitor. He is frustrated that he has not been able to lose any weight.  He flies to [place] and [place] several times a year and has fear of flying - he requests a prescription for Ambien to help with jet lag.  Outpatient Prescriptions Prior to Visit  Medication Sig Dispense Refill  . ADVAIR HFA 115-21 MCG/ACT inhaler USE 2 PUFFS TWICE DAILY AS NEEDED. SHAKE WELL 12 g 11  . Azilsartan-Chlorthalidone (EDARBYCLOR) 40-25 MG TABS Take 1 tablet by mouth daily. 30 tablet 11  . fluticasone (FLONASE) 50 MCG/ACT nasal spray Place 1 spray into the nose daily. 16 g 6  . folic acid (FOLVITE) 1 MG tablet TAKE 1 TABLET ONCE DAILY. 90 tablet 0  . montelukast (SINGULAIR) 10 MG tablet TAKE ONE TABLET AT BEDTIME. 30 tablet 0  . Multiple Vitamin (MULTIVITAMIN) tablet Take 1 tablet by mouth daily.    . pantoprazole (PROTONIX) 40 MG tablet TAKE 1 TABLET ONCE DAILY. 30 tablet 0  . POTASSIUM CITRATE PO Take by mouth daily.    . vitamin B-12 (CYANOCOBALAMIN) 1000 MCG tablet Take 1 tablet (1,000 mcg total) by mouth daily.    . mercaptopurine (PURINETHOL) 50 MG tablet TAKE 1&1/2 TABLETS EVERY OTHER DAY ALTERNATING WITH 2 TABS EVERY OTHER DAY 90 tablet 1  . VENTOLIN HFA 108 (90 BASE)  MCG/ACT inhaler USE 2 PUFFS EVERY 6 HOURS AS NEEDED FOR WHEEZING. **SHAKE WELL** 18 g 11  . traMADol (ULTRAM) 50 MG tablet Take 1 tablet (50 mg total) by mouth every 6 (six) hours as needed. (Patient not taking: Reported on 07/04/2015) 30 tablet 1  . levofloxacin (LEVAQUIN) 500 MG tablet Take 1 tablet (500 mg total) by mouth daily. 10 tablet 0  . predniSONE (DELTASONE) 10 MG tablet 3 tabs by mouth per day for 3 days,2tabs per day for 3 days,1tab per day for 3 days 18 tablet 0   No facility-administered medications prior to visit.    ROS Review of Systems  Constitutional: Negative.  Negative for fever, chills and fatigue.  HENT: Negative.  Negative for congestion, sinus pressure, sore throat and trouble swallowing.   Eyes: Negative.   Respiratory: Positive for wheezing. Negative for apnea, cough, choking, chest tightness, shortness of breath and stridor.   Cardiovascular: Negative.  Negative for chest pain, palpitations and leg swelling.  Gastrointestinal: Negative.  Negative for nausea, vomiting, abdominal pain, diarrhea, constipation and blood in stool.  Endocrine: Negative.  Negative for polydipsia, polyphagia and polyuria.  Genitourinary: Negative.  Negative for dysuria, urgency, frequency, hematuria, flank pain, difficulty urinating and testicular pain.       He gets up once or twice a night to urinate  Musculoskeletal: Negative.   Skin: Negative.   Allergic/Immunologic: Negative.   Neurological: Negative.  Negative for dizziness,  tremors, syncope, light-headedness, numbness and headaches.  Hematological: Negative.  Negative for adenopathy. Does not bruise/bleed easily.  Psychiatric/Behavioral: Negative.     Objective:  BP 112/70 mmHg  Pulse 78  Temp(Src) 97.8 F (36.6 C) (Oral)  Resp 16  Ht 5' 6"  (1.676 m)  Wt 194 lb (87.998 kg)  BMI 31.33 kg/m2  SpO2 95%  BP Readings from Last 3 Encounters:  07/04/15 112/70  03/31/15 130/82  02/22/15 90/56    Wt Readings from Last 3  Encounters:  07/04/15 194 lb (87.998 kg)  03/31/15 194 lb (87.998 kg)  02/22/15 198 lb (89.812 kg)    Physical Exam  Constitutional: He is oriented to person, place, and time. He appears well-developed and well-nourished. No distress.  HENT:  Head: Normocephalic and atraumatic.  Mouth/Throat: Oropharynx is clear and moist. No oropharyngeal exudate.  Eyes: Conjunctivae are normal. Right eye exhibits no discharge. Left eye exhibits no discharge. No scleral icterus.  Neck: Normal range of motion. Neck supple. No JVD present. No tracheal deviation present. No thyromegaly present.  Cardiovascular: Normal rate, regular rhythm, normal heart sounds and intact distal pulses.  Exam reveals no gallop and no friction rub.   No murmur heard. Pulmonary/Chest: Effort normal and breath sounds normal. No stridor. No respiratory distress. He has no wheezes. He has no rales. He exhibits no tenderness.  Abdominal: Soft. Bowel sounds are normal. He exhibits no distension and no mass. There is no tenderness. There is no rebound and no guarding. Hernia confirmed negative in the right inguinal area and confirmed negative in the left inguinal area.  Genitourinary: Rectum normal, testes normal and penis normal. Rectal exam shows no external hemorrhoid, no internal hemorrhoid, no fissure, no mass and no tenderness. Guaiac negative stool. Prostate is enlarged (1+ smooth symm BPH). Prostate is not tender. Right testis shows no mass, no swelling and no tenderness. Right testis is descended. Left testis shows no mass, no swelling and no tenderness. Left testis is descended. Circumcised. No penile erythema or penile tenderness. No discharge found.  Musculoskeletal: Normal range of motion. He exhibits no edema or tenderness.  Lymphadenopathy:    He has no cervical adenopathy.       Right: No inguinal adenopathy present.       Left: No inguinal adenopathy present.  Neurological: He is oriented to person, place, and time.    Skin: Skin is warm and dry. No rash noted. He is not diaphoretic. No erythema. No pallor.  Psychiatric: He has a normal mood and affect. His behavior is normal. Judgment and thought content normal.  Vitals reviewed.   Lab Results  Component Value Date   WBC 5.4 07/04/2015   HGB 13.5 07/04/2015   HCT 39.1 07/04/2015   PLT 301.0 07/04/2015   GLUCOSE 125* 07/04/2015   CHOL 225* 07/04/2015   TRIG 135.0 07/04/2015   HDL 59.50 07/04/2015   LDLCALC 138* 07/04/2015   ALT 51 07/04/2015   AST 29 07/04/2015   NA 138 07/04/2015   K 4.5 07/04/2015   CL 104 07/04/2015   CREATININE 1.44 07/04/2015   BUN 24* 07/04/2015   CO2 24 07/04/2015   TSH 1.29 07/04/2015   PSA 0.41 07/04/2015   HGBA1C 6.3 07/04/2015    Dg Chest 2 View  09/30/2013  CLINICAL DATA:  Congestion and cough for 11 days, nonsmoker, history asthma EXAM: CHEST  2 VIEW COMPARISON:  None FINDINGS: Enlargement of cardiac silhouette. Mediastinal contours and pulmonary vascularity normal. Lungs grossly clear without infiltrate or effusion.  No pneumothorax. Bones demineralized. IMPRESSION: Enlargement of cardiac silhouette. No acute infiltrate. Electronically Signed   By: Lavonia Dana M.D.   On: 09/30/2013 16:12    Assessment & Plan:   Darryl [name] was seen today for annual exam, hyperlipidemia, anemia and gastroesophageal reflux.  Diagnoses and all orders for this visit:  Essential hypertension, benign- his blood pressures well controlled, renal function electrolytes are stable. -     Comprehensive metabolic panel; Future  Vitamin B12 deficiency anemia- improvement noted, he will continue oral B12 and folate supplements -     CBC with Differential/Platelet; Future -     Vitamin B12; Future -     Folate; Future  Other abnormal glucose- his A1c is 6.3%, he is prediabetic, no medications are needed at this time, he agrees to work on his lifestyle modifications with diet/exercise/weight loss.  Gastroesophageal reflux disease without  esophagitis- symptoms are well controlled with a proton pump inhibitor -     Comprehensive metabolic panel; Future -     Hemoglobin A1c; Future  Routine general medical examination at a health care facility  Hyperlipidemia with target LDL less than 130- his Framingham risk score is very mildly elevated at 8%, he does not want to start a statin -     Lipid panel; Future -     Comprehensive metabolic panel; Future -     TSH; Future  BPH associated with nocturia- he does not want to take a medication to treat his symptoms, his PSA is low and his examination is unremarkable so I'm not concerned about prostate cancer. -     Urinalysis, Routine w reflex microscopic (not at [facility] Future -     PSA; Future  Fear of flying -     zolpidem (AMBIEN) 10 MG tablet; Take 1 tablet (10 mg total) by mouth at bedtime as needed for sleep.  Asthma exacerbation, mild -     albuterol (PROVENTIL HFA;VENTOLIN HFA) 108 (90 Base) MCG/ACT inhaler; Inhale 2 puffs into the lungs every 6 (six) hours as needed for wheezing or shortness of breath.  Need for vaccination with 13-polyvalent pneumococcal conjugate vaccine -     Pneumococcal conjugate vaccine 13-valent   I have discontinued Darryl [name]'s VENTOLIN HFA, levofloxacin, and predniSONE. I am also having him start on zolpidem and albuterol. Additionally, I am having him maintain his fluticasone, vitamin B-12, Azilsartan-Chlorthalidone, traMADol, ADVAIR HFA, multivitamin, POTASSIUM CITRATE PO, folic acid, montelukast, and pantoprazole.  Meds ordered this encounter  Medications  . zolpidem (AMBIEN) 10 MG tablet    Sig: Take 1 tablet (10 mg total) by mouth at bedtime as needed for sleep.    Dispense:  15 tablet    Refill:  1  . albuterol (PROVENTIL HFA;VENTOLIN HFA) 108 (90 Base) MCG/ACT inhaler    Sig: Inhale 2 puffs into the lungs every 6 (six) hours as needed for wheezing or shortness of breath.    Dispense:  1 Inhaler    Refill:  5   See AVS for  instructions about healthy living and anticipatory guidance.  Follow-up: Return if symptoms worsen or fail to improve.  Scarlette Calico, MD

## 2015-07-04 NOTE — Patient Instructions (Signed)

## 2015-07-05 ENCOUNTER — Telehealth: Payer: Self-pay | Admitting: Internal Medicine

## 2015-07-05 MED ORDER — MERCAPTOPURINE 50 MG PO TABS: ORAL_TABLET | ORAL | Status: DC

## 2015-07-05 NOTE — Telephone Encounter (Signed)
See previous phone note from 07/04/15.

## 2015-07-05 NOTE — Telephone Encounter (Signed)
I have left a voicemail for patient to call back.

## 2015-07-05 NOTE — Telephone Encounter (Signed)
I have advised patient that I have made an appointment for him on 07/17/15 for office visit. He states he will come for this visit. He also tells me that he labs yesterday with Dr Ronnald Ramp so I have filled patient's mercaptopurine at this time.

## 2015-07-07 NOTE — Assessment & Plan Note (Signed)

## 2015-07-10 ENCOUNTER — Other Ambulatory Visit: Payer: Self-pay | Admitting: Internal Medicine

## 2015-07-17 ENCOUNTER — Encounter: Payer: Self-pay | Admitting: Internal Medicine

## 2015-07-17 ENCOUNTER — Ambulatory Visit (INDEPENDENT_AMBULATORY_CARE_PROVIDER_SITE_OTHER): Payer: Medicare Other | Admitting: Internal Medicine

## 2015-07-17 VITALS — BP 100/64 | HR 60 | Ht 66.0 in | Wt 193.2 lb

## 2015-07-17 DIAGNOSIS — K219 Gastro-esophageal reflux disease without esophagitis: Secondary | ICD-10-CM

## 2015-07-17 DIAGNOSIS — K5090 Crohn's disease, unspecified, without complications: Secondary | ICD-10-CM

## 2015-07-17 MED ORDER — TRAMADOL HCL 50 MG PO TABS: 50.0000 mg | ORAL_TABLET | Freq: Four times a day (QID) | ORAL | Status: DC | PRN

## 2015-07-17 MED ORDER — MERCAPTOPURINE 50 MG PO TABS: ORAL_TABLET | ORAL | Status: DC

## 2015-07-17 MED ORDER — PANTOPRAZOLE SODIUM 40 MG PO TBEC: 40.0000 mg | DELAYED_RELEASE_TABLET | Freq: Every day | ORAL | Status: DC

## 2015-07-17 NOTE — Progress Notes (Signed)
Subjective:    Patient ID: Darryl [name], male    DOB: 03/29/1950, 65 y.o.   MRN: [id number]  HPI Darryl [name] is a 65 year old male with history of Crohn's colitis for greater than 2 decades managed on 6-MP, GERD who is here for follow-up. Last seen in the office on 04/18/2014 but did come for surveillance colonoscopy on 02/22/2015. This showed evidence of scarring in the ascending and transverse colon but no evidence of active colitis. There was a 3 mm transverse colon polyp removed. Surveillance biopsies were performed which showed histologically normal colonic mucosa. No dysplasia. The polyp was a prolapse-type polyp without adenomatous change. He has been doing very well recently. He continue 6-MP 100 mg alternating with 75 mg daily. No recent bowel complaint. Bowel movements are regular without blood or melena. No abdominal pain. No rectal symptoms or tenesmus. Joint pains have improved. He did see Dr. Amil Amen and he found no evidence of inflammatory arthritis. He has increased exercise and joint pains improved. He rarely uses tramadol if he walks more than normal several days in a row, particularly when traveling. Appetite has been good. No upper GI complaint he continues with pantoprazole 40 mg daily with good heartburn and reflux control. He has been diagnosed with prediabetes and has been watching his carbohydrate intake and eating less snacks.  Reminder: Colitis flare when stopping 6-MP several years ago  Review of Systems As per history of present illness, otherwise negative  Current Medications, Allergies, Past Medical History, Past Surgical History, Family History and Social History were reviewed in [employer] record.     Objective:   Physical Exam BP 100/64 mmHg  Pulse 60  Ht 5' 6"  (1.676 m)  Wt 193 lb 3.2 oz (87.635 kg)  BMI 31.20 kg/m2 Constitutional: Well-developed and well-nourished. No distress. HEENT: Normocephalic and atraumatic. Oropharynx is  clear and moist. No oropharyngeal exudate. Conjunctivae are normal.  No scleral icterus. Neck: Neck supple. Trachea midline. Cardiovascular: Normal rate, regular rhythm and intact distal pulses. No M/R/G Pulmonary/chest: Effort normal and breath sounds normal. No wheezing, rales or rhonchi. Abdominal: Soft, nontender, nondistended. Bowel sounds active throughout. There are no masses palpable. No hepatosplenomegaly. Extremities: no clubbing, cyanosis, or edema Lymphadenopathy: No cervical adenopathy noted. Neurological: Alert and oriented to person place and time. Skin: Skin is warm and dry. No rashes noted. Psychiatric: Normal mood and affect. Behavior is normal.  CBC    Component Value Date/Time   WBC 5.4 07/04/2015 0856   RBC 3.75* 07/04/2015 0856   HGB 13.5 07/04/2015 0856   HCT 39.1 07/04/2015 0856   PLT 301.0 07/04/2015 0856   MCV 104.3* 07/04/2015 0856   MCHC 34.5 07/04/2015 0856   RDW 15.2 07/04/2015 0856   LYMPHSABS 1.5 07/04/2015 0856   MONOABS 0.4 07/04/2015 0856   EOSABS 0.0 07/04/2015 0856   BASOSABS 0.0 07/04/2015 0856    CMP     Component Value Date/Time   NA 138 07/04/2015 0856   K 4.5 07/04/2015 0856   CL 104 07/04/2015 0856   CO2 24 07/04/2015 0856   GLUCOSE 125* 07/04/2015 0856   BUN 24* 07/04/2015 0856   CREATININE 1.44 07/04/2015 0856   CALCIUM 10.0 07/04/2015 0856   PROT 7.1 07/04/2015 0856   ALBUMIN 4.2 07/04/2015 0856   AST 29 07/04/2015 0856   ALT 51 07/04/2015 0856   ALKPHOS 61 07/04/2015 0856   BILITOT 0.6 07/04/2015 0856   GFRNONAA 72.63 04/27/2009 0848   GFRAA 99 02/12/2008 0935  Assessment & Plan:  65 year old male with history of Crohn's colitis for greater than 2 decades managed on 6-MP, GERD who is here for follow-up.  1. Crohn's colitis, long-standing -- in remission on 6-MP. Continue 6-MP daily. Recent CBC and liver enzymes within normal limits. Surveillance colonoscopy recommended November 2018. Annual flu vaccine  recommended an up-to-date with pneumococcal, and Tdap vaccines.  2. Joint pains -- seen by rheumatology. Joint pains improved with exercise --Tramadol prescription for very sparing and rare use in the event of worsening joint pain  3. B12 deficiency -- continue B12 daily.  One year follow-up, sooner if needed 15 minutes spent with the patient today. Greater than 50% was spent in counseling and coordination of care with the patient

## 2015-07-17 NOTE — Patient Instructions (Signed)
We have sent the following medications to your pharmacy for you to pick up at your convenience: Tramadol Mercaptopurine Pantoprazole  Please follow up with Dr Hilarie Fredrickson in 1 year.  If you are age 66 or older, your body mass index should be between 23-30. Your Body mass index is 31.2 kg/(m^2). If this is out of the aforementioned range listed, please consider follow up with your Primary Care Provider.  If you are age 24 or younger, your body mass index should be between 19-25. Your Body mass index is 31.2 kg/(m^2). If this is out of the aformentioned range listed, please consider follow up with your Primary Care Provider.

## 2015-08-10 ENCOUNTER — Other Ambulatory Visit: Payer: Self-pay | Admitting: Internal Medicine

## 2015-08-15 ENCOUNTER — Other Ambulatory Visit: Payer: Self-pay | Admitting: Internal Medicine

## 2015-09-01 ENCOUNTER — Encounter: Payer: Self-pay | Admitting: Internal Medicine

## 2015-09-01 ENCOUNTER — Ambulatory Visit (INDEPENDENT_AMBULATORY_CARE_PROVIDER_SITE_OTHER): Payer: Medicare Other | Admitting: Internal Medicine

## 2015-09-01 VITALS — BP 122/72 | HR 88 | Temp 98.2°F | Resp 16 | Wt 193.0 lb

## 2015-09-01 DIAGNOSIS — J209 Acute bronchitis, unspecified: Secondary | ICD-10-CM

## 2015-09-01 DIAGNOSIS — J45901 Unspecified asthma with (acute) exacerbation: Secondary | ICD-10-CM

## 2015-09-01 MED ORDER — LEVOFLOXACIN 500 MG PO TABS: 500.0000 mg | ORAL_TABLET | Freq: Every day | ORAL | Status: DC

## 2015-09-01 MED ORDER — BENZONATATE 200 MG PO CAPS: 200.0000 mg | ORAL_CAPSULE | Freq: Two times a day (BID) | ORAL | Status: DC | PRN

## 2015-09-01 MED ORDER — HYDROCODONE-HOMATROPINE 5-1.5 MG/5ML PO SYRP: 5.0000 mL | ORAL_SOLUTION | Freq: Three times a day (TID) | ORAL | Status: DC | PRN

## 2015-09-01 NOTE — Assessment & Plan Note (Signed)
Mild, no need for steroids Continue inhalers

## 2015-09-01 NOTE — Progress Notes (Signed)
Pre visit review using our clinic review tool, if applicable. No additional management support is needed unless otherwise documented below in the visit note. 

## 2015-09-01 NOTE — Progress Notes (Signed)
Subjective:    Patient ID: Darryl [name], male    DOB: 08-Mar-1951, 65 y.o.   MRN: [id number]  HPI He is here for an acute visit for cold symptoms.  He has asthma.    His symptoms started about 10 days ago.  One of his co-workers was sick and he was also around his grand kids, one of which was sick as well.  He has nasal congestion, rhinorrhea, cough - dry and productive as times, he has had some increased shortness of breath and wheezing.  He denies fever, headaches and lightheadedness.   He has tried taking advil cold and sinus and it keeps him up at night, but helps during hte day.  He is taking his inhalers.    Medications and allergies reviewed with patient and updated if appropriate.  Patient Active Problem List   Diagnosis Date Noted  . Hyperlipidemia with target LDL less than 130 07/04/2015  . BPH associated with nocturia 07/04/2015  . Fear of flying 07/04/2015  . Essential hypertension, benign 12/01/2013  . Routine general medical examination at a health care facility 05/19/2013  . Screening, heart disease, ischemic 05/19/2013  . CROHN'S DISEASE, LARGE INTESTINE 04/27/2009  . Other abnormal glucose 03/09/2008  . Vitamin B12 deficiency anemia 02/08/2008  . GERD 02/08/2008  . OBESITY, NOS 05/22/2006  . Asthma exacerbation, mild 05/22/2006    Current Outpatient Prescriptions on File Prior to Visit  Medication Sig Dispense Refill  . ADVAIR HFA 115-21 MCG/ACT inhaler USE 2 PUFFS TWICE DAILY AS NEEDED. SHAKE WELL 12 g 11  . albuterol (PROVENTIL HFA;VENTOLIN HFA) 108 (90 Base) MCG/ACT inhaler Inhale 2 puffs into the lungs every 6 (six) hours as needed for wheezing or shortness of breath. 1 Inhaler 5  . EDARBYCLOR 40-25 MG TABS TAKE 1 TABLET ONCE DAILY. 30 tablet 11  . fluticasone (FLONASE) 50 MCG/ACT nasal spray Place 1 spray into the nose daily. 16 g 6  . folic acid (FOLVITE) 1 MG tablet TAKE 1 TABLET ONCE DAILY. 90 tablet 1  . mercaptopurine (PURINETHOL) 50 MG tablet TAKE  1&1/2 TABLETS EVERY OTHER DAY ALTERNATING WITH 2 TABS EVERY OTHER DAY 90 tablet 9  . montelukast (SINGULAIR) 10 MG tablet TAKE ONE TABLET AT BEDTIME. 30 tablet 5  . Multiple Vitamin (MULTIVITAMIN) tablet Take 1 tablet by mouth daily.    . pantoprazole (PROTONIX) 40 MG tablet Take 1 tablet (40 mg total) by mouth daily. 30 tablet 10  . traMADol (ULTRAM) 50 MG tablet Take 1 tablet (50 mg total) by mouth every 6 (six) hours as needed. 30 tablet 0  . vitamin B-12 (CYANOCOBALAMIN) 1000 MCG tablet Take 1 tablet (1,000 mcg total) by mouth daily.    Marland Kitchen zolpidem (AMBIEN) 10 MG tablet Take 1 tablet (10 mg total) by mouth at bedtime as needed for sleep. 15 tablet 1   No current facility-administered medications on file prior to visit.    Past Medical History  Diagnosis Date  . GERD (gastroesophageal reflux disease)   . Vitamin B12 deficiency   . Lateral epicondylitis  of elbow   . Exostosis of unspecified site   . Nontraumatic rupture of patellar tendon   . Acute upper respiratory infections of unspecified site   . Dizziness and giddiness   . Allergic rhinitis, cause unspecified   . Regional enteritis of unspecified site   . Obesity, unspecified   . Asthma   . Hyperplastic colon polyp 02/26/2008  . Crohn's colitis ([place])   . Cataract   .  Hypertension     Past Surgical History  Procedure Laterality Date  . Tonsillectomy and adenoidectomy      Social History   Social History  . Marital Status: Married    Spouse Name: N/A  . Number of Children: N/A  . Years of Education: N/A   Occupational History  . accountant    Social History Main Topics  . Smoking status: Never Smoker   . Smokeless tobacco: Never Used  . Alcohol Use: 0.0 oz/week    0 Standard drinks or equivalent per week     Comment: rare  . Drug Use: No  . Sexual Activity: Yes   Other Topics Concern  . Not on file   Social History Narrative    Family History  Problem Relation Age of Onset  . Diabetes Brother   .  Diabetes Father   . Heart attack Father   . Hypertension Father   . Heart disease Father   . Colon cancer Neg Hx   . Stomach cancer Neg Hx   . Hyperlipidemia Neg Hx   . Sudden death Neg Hx   . Cancer Neg Hx   . COPD Neg Hx   . Alcohol abuse Neg Hx   . Drug abuse Neg Hx   . Stroke Neg Hx   . Rectal cancer Neg Hx   . Esophageal cancer Neg Hx     Review of Systems  Constitutional: Negative for fever and chills.  HENT: Positive for congestion, rhinorrhea and sore throat (mild). Negative for ear pain and sinus pressure.   Respiratory: Positive for cough, shortness of breath and wheezing.   Cardiovascular: Negative for chest pain.  Gastrointestinal: Negative for nausea, abdominal pain and diarrhea.  Musculoskeletal: Negative for myalgias.  Neurological: Negative for dizziness, light-headedness and headaches.       Objective:   Filed Vitals:   09/01/15 1605  BP: 122/72  Pulse: 88  Temp: 98.2 F (36.8 C)  Resp: 16   Filed Weights   09/01/15 1605  Weight: 193 lb (87.544 kg)   Body mass index is 31.17 kg/(m^2).   Physical Exam GENERAL APPEARANCE: Appears stated age, well appearing, NAD EYES: conjunctiva clear, no icterus HEENT: bilateral tympanic membranes and ear canals normal, oropharynx with no erythema, no thyromegaly, trachea midline, no cervical or supraclavicular lymphadenopathy LUNGS: Clear to auscultation without wheeze or crackles, unlabored breathing, good air entry bilaterally HEART: Normal S1,S2 without murmurs EXTREMITIES: Without clubbing, cyanosis, or edema        Assessment & Plan:   See Problem List for Assessment and Plan of chronic medical problems.

## 2015-09-01 NOTE — Assessment & Plan Note (Signed)
Mild-mod Concern for bacterial cause Traveling out of country tomorrow Will start antibiotic - levaquin which as worked well in the past - warned of risk of tendonitis/neuropathy Tessalon perles Hycodan cough syrup for night

## 2015-09-01 NOTE — Patient Instructions (Signed)
Your prescription(s) have been submitted to your pharmacy or been printed and provided for you. Please take as directed and contact our office if you believe you are having problem(s) with the medication(s) or have any questions.  If your symptoms worsen or fail to improve, please contact our office for further instruction, or in case of emergency go directly to the emergency room at the closest medical facility.   General Recommendations:    Please drink plenty of fluids.  Get plenty of rest   Sleep in humidified air  Use saline nasal sprays  Netti pot  OTC Medications:  Decongestants - helps relieve congestion   Flonase (generic fluticasone) or Nasacort (generic triamcinolone) - please make sure to use the "cross-over" technique at a 45 degree angle towards the opposite eye as opposed to straight up the nasal passageway.   Sudafed (generic pseudoephedrine - Note this is the one that is available behind the pharmacy counter); Products with phenylephrine (-PE) may also be used but is often not as effective as pseudoephedrine.   If you have HIGH BLOOD PRESSURE - Coricidin HBP; AVOID any product that is -D as this contains pseudoephedrine which may increase your blood pressure.  Afrin (oxymetazoline) every 6-8 hours for up to 3 days.  Allergies - helps relieve runny nose, itchy eyes and sneezing   Claritin (generic loratidine), Allegra (fexofenidine), or Zyrtec (generic cyrterizine) for runny nose. These medications should not cause drowsiness.  Note - Benadryl (generic diphenhydramine) may be used however may cause drowsiness  Cough -   Delsym or Robitussin (generic dextromethorphan)  Expectorants - helps loosen mucus to ease removal   Mucinex (generic guaifenesin) as directed on the package.  Headaches / General Aches   Tylenol (generic acetaminophen) - DO NOT EXCEED 3 grams (3,000 mg) in a 24 hour time period  Advil/Motrin (generic  ibuprofen)  Sore Throat -   Salt water gargle   Chloraseptic (generic benzocaine) spray or lozenges / Sucrets (generic dyclonine)

## 2015-11-14 ENCOUNTER — Other Ambulatory Visit: Payer: Self-pay | Admitting: Internal Medicine

## 2015-11-14 DIAGNOSIS — J45901 Unspecified asthma with (acute) exacerbation: Secondary | ICD-10-CM

## 2016-01-09 ENCOUNTER — Other Ambulatory Visit: Payer: Self-pay | Admitting: Internal Medicine

## 2016-02-10 ENCOUNTER — Other Ambulatory Visit: Payer: Self-pay | Admitting: Internal Medicine

## 2016-04-02 ENCOUNTER — Ambulatory Visit (INDEPENDENT_AMBULATORY_CARE_PROVIDER_SITE_OTHER): Payer: Medicare Other | Admitting: Family

## 2016-04-02 ENCOUNTER — Encounter: Payer: Self-pay | Admitting: Family

## 2016-04-02 DIAGNOSIS — S40011A Contusion of right shoulder, initial encounter: Secondary | ICD-10-CM | POA: Diagnosis not present

## 2016-04-02 DIAGNOSIS — S40019A Contusion of unspecified shoulder, initial encounter: Secondary | ICD-10-CM | POA: Insufficient documentation

## 2016-04-02 NOTE — Progress Notes (Signed)
Subjective:    Patient ID: Darryl [name], male    DOB: 02-10-1951, 66 y.o.   MRN: [id number]  Chief Complaint  Patient presents with  . Fall    fell on ice yesterday, hit right elbow and feels pain in shoulder going down arm    HPI:  Darryl [name] is a 66 y.o. male who  has a past medical history of Acute upper respiratory infections of unspecified site; Allergic rhinitis, cause unspecified; Asthma; Cataract; Crohn's colitis ([place]); Dizziness and giddiness; Exostosis of unspecified site; GERD (gastroesophageal reflux disease); Hyperplastic colon polyp (02/26/2008); Hypertension; Lateral epicondylitis  of elbow; Nontraumatic rupture of patellar tendon; Obesity, unspecified; Regional enteritis of unspecified site; and Vitamin B12 deficiency. and presents today for an acute office visit.  This is a new problem. Associated symptom of pain located in his right shoulder and elbow has been going on for about 1 day since falling on ice landing on his right elbow and then onto his shoulder. He was able to raise his arms above his head initially and now having some popping sensation in his shoulder. No numbness or tingling. No previous shoulder or elbow injury. Modifying factors include Voltaren gel and Aleve which seem to help quite a bit. No weakness.   Allergies  Allergen Reactions  . Asacol [Mesalamine] Hives    Trouble swallowing, loss of appetite  . Amoxicillin Rash  . Sulfamethoxazole Rash      Outpatient Medications Prior to Visit  Medication Sig Dispense Refill  . ADVAIR HFA 115-21 MCG/ACT inhaler USE 2 PUFFS TWICE DAILY AS NEEDED. SHAKE WELL 12 g 11  . benzonatate (TESSALON) 200 MG capsule Take 1 capsule (200 mg total) by mouth 2 (two) times daily as needed for cough. 30 capsule 0  . EDARBYCLOR 40-25 MG TABS TAKE 1 TABLET ONCE DAILY. 30 tablet 11  . fluticasone (FLONASE) 50 MCG/ACT nasal spray Place 1 spray into the nose daily. 16 g 6  . folic acid (FOLVITE) 1 MG tablet TAKE 1  TABLET ONCE DAILY. 90 tablet 0  . HYDROcodone-homatropine (HYCODAN) 5-1.5 MG/5ML syrup Take 5 mLs by mouth every 8 (eight) hours as needed for cough. 120 mL 0  . mercaptopurine (PURINETHOL) 50 MG tablet TAKE 1&1/2 TABLETS EVERY OTHER DAY ALTERNATING WITH 2 TABS EVERY OTHER DAY 90 tablet 9  . montelukast (SINGULAIR) 10 MG tablet TAKE ONE TABLET AT BEDTIME. 30 tablet 11  . Multiple Vitamin (MULTIVITAMIN) tablet Take 1 tablet by mouth daily.    . pantoprazole (PROTONIX) 40 MG tablet Take 1 tablet (40 mg total) by mouth daily. 30 tablet 10  . PROAIR HFA 108 (90 Base) MCG/ACT inhaler USE 2 PUFFS EVERY 6 HOURS AS NEEDED FOR WHEEZING. **SHAKE WELL** 8.5 g 11  . traMADol (ULTRAM) 50 MG tablet Take 1 tablet (50 mg total) by mouth every 6 (six) hours as needed. 30 tablet 0  . vitamin B-12 (CYANOCOBALAMIN) 1000 MCG tablet Take 1 tablet (1,000 mcg total) by mouth daily.    Marland Kitchen levofloxacin (LEVAQUIN) 500 MG tablet Take 1 tablet (500 mg total) by mouth daily. 10 tablet 0  . zolpidem (AMBIEN) 10 MG tablet Take 1 tablet (10 mg total) by mouth at bedtime as needed for sleep. 15 tablet 1   No facility-administered medications prior to visit.      Review of Systems  Constitutional: Negative for chills and fever.  Musculoskeletal:       Positive for right shoulder pain.  Neurological: Negative for weakness and numbness.  Objective:    BP 122/70 (BP Location: Left Arm, Patient Position: Sitting, Cuff Size: Normal)   Pulse (!) 103   Temp 98.1 F (36.7 C) (Oral)   Resp 18   Ht 5' 6"  (1.676 m)   Wt 197 lb (89.4 kg)   SpO2 96%   BMI 31.80 kg/m  Nursing note and vital signs reviewed.  Physical Exam  Constitutional: He is oriented to person, place, and time. He appears well-developed and well-nourished. No distress.  Cardiovascular: Normal rate, regular rhythm, normal heart sounds and intact distal pulses.   Pulmonary/Chest: Effort normal and breath sounds normal.  Musculoskeletal:  Right shoulder  - no obvious deformity, discoloration, or edema. Tenderness elicited over subacromial space, deltoid, and biceps tendon. No crepitus or deformity noted. Range of motion within normal limits with mild click sensation. Distal pulses and sensation are intact and appropriate. Negative empty can; negative Hawkins-Kennedy; negative Neer's impingement. Positive Yeager symptoms  Neurological: He is alert and oriented to person, place, and time.  Skin: Skin is warm and dry.  Psychiatric: He has a normal mood and affect. His behavior is normal. Judgment and thought content normal.       Assessment & Plan:   Problem List Items Addressed This Visit      Other   Shoulder contusion    Symptoms and exam consistent with shoulder contusion most likely from fall with apparent mild subluxation of the biceps tendon reproducible with Yeager sign. Treat conservatively with ice, home exercise therapy, and over-the-counter anti-inflammatories. Follow-up if symptoms worsen or do not improve for possible imaging or cortisone injection if needed.          I have discontinued Mr. Younglove's levofloxacin. I am also having him maintain his fluticasone, vitamin B-12, multivitamin, zolpidem, pantoprazole, mercaptopurine, traMADol, EDARBYCLOR, benzonatate, HYDROcodone-homatropine, PROAIR HFA, ADVAIR HFA, montelukast, and folic acid.   Follow-up: Return in about 3 weeks (around 04/23/2016), or if symptoms worsen or fail to improve.  Mauricio Po, FNP

## 2016-04-02 NOTE — Patient Instructions (Signed)
Thank you for choosing [facility]!  Ice / moist heat x 20 minutes every 2 hours and as needed or following activity  Exercises 1-2 times per day as instructed.    If your symptoms worsen or fail to improve, please contact our office for further instruction, or in case of emergency go directly to the emergency room at the closest medical facility.    Rotator Cuff Tear Rehab After Surgery Ask your health care provider which exercises are safe for you. Do exercises exactly as told by your health care provider and adjust them as directed. It is normal to feel mild stretching, pulling, tightness, or discomfort as you do these exercises, but you should stop right away if you feel sudden pain or your pain gets worse. Do not begin these exercises until told by your health care provider. Stretching and range of motion exercises These exercises warm up your muscles and joints and improve the movement and flexibility of your shoulder. These exercises also help to relieve pain, numbness, and tingling. Exercise A: Pendulum 1. Stand near a wall or a surface that you can hold onto for balance. 2. Bend at the waist and let your left / right arm hang straight down. Use your other arm to keep your balance. 3. Relax your arm and shoulder muscles, and move your hips and your trunk so your left / right arm swings freely. Your arm should swing because of the motion of your body, not because you are using your arm or shoulder muscles. 4. Keep moving so your arm swings in the following directions, as told by your health care provider:  Side to side.  Forward and backward.  In clockwise and counterclockwise circles. Repeat __________ times, or for __________ seconds per direction. Complete this exercise __________ times a day. Exercise B: Flexion, seated 1. Sit in a stable chair so your left / right forearm can rest on a flat surface. Your elbow should rest at a height that keeps your upper arm next to  your body. 2. Keeping your shoulder relaxed, lean forward at the waist and let your hand slide forward. Stop when you feel a stretch in your shoulder, or when you reach the angle that is recommended by your health care provider. 3. Hold for __________ seconds. 4. Slowly return to the starting position. Repeat __________ times. Complete this exercise __________ times a day. Exercise C: Flexion, standing 1. Stand and hold a broomstick, a cane, or a similar object. Place your hands a little more than shoulder-width apart on the object. Your left / right hand should be palm-up, and your other hand should be palm-down. 2. Push the stick down with your healthy arm to raise your left / right arm in front of your body, and then over your head. Use your other hand to help move the stick. Stop when you feel a stretch in your shoulder, or when you reach the angle that is recommended by your health care provider.  Avoid shrugging your shoulder while you raise your arm. Keep your shoulder blade tucked down toward your spine.  Keep your left / right shoulder muscles relaxed. 3. Hold for __________ seconds. 4. Slowly return to the starting position. Repeat __________ times. Complete this exercise __________ times a day. Exercise D: Abduction, supine 1. Lie on your back and hold a broomstick, a cane, or a similar object. Place your hands a little more than shoulder-width apart on the object. Your left / right hand should be palm-up, and  your other hand should be palm-down. 2. Push the stick to raise your left / right arm out to your side and then over your head. Use your other hand to help move the stick. Stop when you feel a stretch in your shoulder, or when you reach the angle that is recommended by your health care provider.  Avoid shrugging your shoulder while you raise your arm. Keep your shoulder blade tucked down toward your spine. 3. Hold for __________ seconds. 4. Slowly return to the starting  position. Repeat __________ times. Complete this exercise __________ times a day. Exercise E: Shoulder flexion, active-assisted 1. Lie on your back. You may bend your knees for comfort. 2. Hold a broomstick, a cane, or a similar object so your hands are about shoulder-width apart. Your palms should face toward your feet. 3. Raise your left / right arm over your head and behind your head, toward the floor. Use your other hand to help you do this. Stop when you feel a gentle stretch in your shoulder, or when you reach the angle that is recommended by your health care provider. 4. Hold for __________ seconds. 5. Use the broomstick and your other arm to help you return your left / right arm to the starting position. Repeat __________ times. Complete this exercise __________ times a day. Exercise F: External rotation 1. Sit in a stable chair without armrests, or stand. 2. Tuck a soft object, such as a folded towel or a small ball, under your left / right upper arm. 3. Hold a broomstick, a cane, or a similar object so your palms face down, toward the floor. Bend your elbows to an "L" shape (90 degrees), and keep your hands about shoulder-width apart. 4. Straighten your healthy arm and push the broomstick across your body, toward your left / right side. Keep your left / right arm bent. This will rotate your left / right forearm away from your body. 5. Hold for __________ seconds. 6. Slowly return to the starting position. Repeat __________ times. Complete this exercise __________ times a day. Strengthening exercises These exercises build strength and endurance in your shoulder. Endurance is the ability to use your muscles for a long time, even after they get tired. Exercise G: Shoulder flexion, isometric 1. Stand or sit about 4-6 inches (10-15 cm) away from a wall with your left / right side facing the wall. 2. Gently make a fist and place your left / right hand on the wall so the top of your fist  touches the wall. 3. With your left / right elbow straight, gently press the top of your fist into the wall. Gradually increase the pressure until you are pressing as hard as you can without shrugging your shoulder. 4. Hold for __________ seconds. 5. Slowly release the tension and relax your muscles completely before you repeat the exercise. Repeat __________ times. Complete this exercise __________ times a day. Exercise H: Shoulder abduction, isometric 1. Stand or sit about 4-6 inches (10-15 cm) away from a wall with your right/left side facing the wall. 2. Bend your left / right elbow and gently press your elbow into the wall as if you are trying to move your arm out to your side. Increase the pressure gradually until you are pressing as hard as you can without shrugging your shoulder. 3. Hold for __________ seconds. 4. Slowly release the tension and relax your muscles completely before repeating the exercise. Repeat __________ times. Complete this exercise __________ times a day. Exercise  I: Internal rotation, isometric 1. Stand or sit in a doorway, facing the door frame. 2. Bend your left / right elbow and place the palm of your hand against the door frame. Only your palm should be touching the frame. Keep your upper arm at your side. 3. Gently press your hand into the door frame, as if you are trying to push your arm toward your abdomen. Do not let your wrist bend.  Avoid shrugging your shoulder while you press your hand into the door frame. Keep your shoulder blade tucked down toward the middle of your back. 4. Hold for __________ seconds. 5. Slowly release the tension, and relax your muscles completely before you repeat the exercise. Repeat __________ times. Complete this exercise __________ times a day. Exercise J: External rotation, isometric 1. Stand or sit in a doorway, facing the door frame. 2. Bend your left / right elbow and place the back of your wrist against the door frame.  Only the back of your wrist should be touching the frame. Keep your upper arm at your side. 3. Gently press your wrist against the door frame, as if you are trying to push your arm away from your abdomen.  Avoid shrugging your shoulder while you press your wrist into the door frame. Keep your shoulder blade tucked down toward the middle of your back. 4. Hold for __________ seconds. 5. Slowly release the tension, and relax your muscles completely before you repeat the exercise. Repeat __________ times. Complete this exercise __________ times a day. This information is not intended to replace advice given to you by your health care provider. Make sure you discuss any questions you have with your health care provider. Document Released: 03/11/2005 Document Revised: 11/16/2015 Document Reviewed: 03/25/2015 Elsevier Interactive Patient Education  2017 [employer].

## 2016-04-02 NOTE — Assessment & Plan Note (Addendum)
Symptoms and exam consistent with shoulder contusion most likely from fall with apparent mild subluxation of the biceps tendon reproducible with Yeager sign. Treat conservatively with ice, home exercise therapy, and over-the-counter anti-inflammatories. Follow-up if symptoms worsen or do not improve for possible imaging or cortisone injection if needed.

## 2016-05-14 ENCOUNTER — Other Ambulatory Visit: Payer: Self-pay | Admitting: Internal Medicine

## 2016-06-17 ENCOUNTER — Other Ambulatory Visit: Payer: Self-pay | Admitting: Internal Medicine

## 2016-07-12 ENCOUNTER — Other Ambulatory Visit: Payer: Self-pay | Admitting: Internal Medicine

## 2016-07-16 ENCOUNTER — Other Ambulatory Visit: Payer: Self-pay | Admitting: Internal Medicine

## 2016-07-22 ENCOUNTER — Telehealth: Payer: Self-pay | Admitting: Internal Medicine

## 2016-07-22 MED ORDER — PANTOPRAZOLE SODIUM 40 MG PO TBEC: 40.0000 mg | DELAYED_RELEASE_TABLET | Freq: Every day | ORAL | 0 refills | Status: DC

## 2016-07-22 NOTE — Telephone Encounter (Signed)
Pt would liek refill of pantoprazole (PROTONIX) 40 MG tablet    Has appt 5/8  [facility] in [place]

## 2016-07-22 NOTE — Telephone Encounter (Signed)
Pt left msg on triage requesting refill on his pantoprazole. He has made appt for next week, but has taken last pill today. Per office policy will send 30 say script until appt...Darryl [name]

## 2016-07-30 ENCOUNTER — Other Ambulatory Visit (INDEPENDENT_AMBULATORY_CARE_PROVIDER_SITE_OTHER): Payer: Medicare Other

## 2016-07-30 ENCOUNTER — Ambulatory Visit (INDEPENDENT_AMBULATORY_CARE_PROVIDER_SITE_OTHER): Payer: Medicare Other | Admitting: Internal Medicine

## 2016-07-30 ENCOUNTER — Encounter: Payer: Self-pay | Admitting: Internal Medicine

## 2016-07-30 VITALS — BP 112/68 | HR 94 | Temp 97.8°F | Ht 66.0 in | Wt 190.5 lb

## 2016-07-30 DIAGNOSIS — D518 Other vitamin B12 deficiency anemias: Secondary | ICD-10-CM

## 2016-07-30 DIAGNOSIS — E785 Hyperlipidemia, unspecified: Secondary | ICD-10-CM | POA: Diagnosis not present

## 2016-07-30 DIAGNOSIS — R351 Nocturia: Secondary | ICD-10-CM

## 2016-07-30 DIAGNOSIS — Z0001 Encounter for general adult medical examination with abnormal findings: Secondary | ICD-10-CM

## 2016-07-30 DIAGNOSIS — I10 Essential (primary) hypertension: Secondary | ICD-10-CM | POA: Diagnosis not present

## 2016-07-30 DIAGNOSIS — R7303 Prediabetes: Secondary | ICD-10-CM

## 2016-07-30 DIAGNOSIS — Z0000 Encounter for general adult medical examination without abnormal findings: Secondary | ICD-10-CM

## 2016-07-30 DIAGNOSIS — N401 Enlarged prostate with lower urinary tract symptoms: Secondary | ICD-10-CM

## 2016-07-30 LAB — LIPID PANEL
CHOL/HDL RATIO: 4
Cholesterol: 224 mg/dL — ABNORMAL HIGH (ref 0–200)
HDL: 56.7 mg/dL (ref >39.00)
LDL Cholesterol: 130 mg/dL — ABNORMAL HIGH (ref 0–99)
NONHDL: 167.12
Triglycerides: 184 mg/dL — ABNORMAL HIGH (ref 0.0–149.0)
VLDL: 36.8 mg/dL (ref 0.0–40.0)

## 2016-07-30 LAB — CBC WITH DIFFERENTIAL/PLATELET
Basophils Absolute: 0 10*3/uL (ref 0.0–0.1)
Basophils Relative: 0.7 % (ref 0.0–3.0)
EOS PCT: 0.5 % (ref 0.0–5.0)
Eosinophils Absolute: 0 10*3/uL (ref 0.0–0.7)
HEMATOCRIT: 38.7 % — AB (ref 39.0–52.0)
Hemoglobin: 13.5 g/dL (ref 13.0–17.0)
LYMPHS ABS: 1.7 10*3/uL (ref 0.7–4.0)
Lymphocytes Relative: 30 % (ref 12.0–46.0)
MCHC: 34.9 g/dL (ref 30.0–36.0)
MCV: 106.4 fl — AB (ref 78.0–100.0)
MONOS PCT: 7.8 % (ref 3.0–12.0)
Monocytes Absolute: 0.4 10*3/uL (ref 0.1–1.0)
NEUTROS ABS: 3.4 10*3/uL (ref 1.4–7.7)
NEUTROS PCT: 61 % (ref 43.0–77.0)
PLATELETS: 326 10*3/uL (ref 150.0–400.0)
RBC: 3.64 Mil/uL — AB (ref 4.22–5.81)
RDW: 14.8 % (ref 11.5–15.5)
WBC: 5.6 10*3/uL (ref 4.0–10.5)

## 2016-07-30 LAB — URINALYSIS, ROUTINE W REFLEX MICROSCOPIC
Bilirubin Urine: NEGATIVE
Hgb urine dipstick: NEGATIVE
Ketones, ur: NEGATIVE
Leukocytes, UA: NEGATIVE
Nitrite: NEGATIVE
PH: 6.5 (ref 5.0–8.0)
RBC / HPF: NONE SEEN (ref 0–?)
Specific Gravity, Urine: 1.01 (ref 1.000–1.030)
TOTAL PROTEIN, URINE-UPE24: NEGATIVE
Urine Glucose: NEGATIVE
Urobilinogen, UA: 0.2 (ref 0.0–1.0)
WBC, UA: NONE SEEN (ref 0–?)

## 2016-07-30 LAB — COMPREHENSIVE METABOLIC PANEL
ALK PHOS: 56 U/L (ref 39–117)
ALT: 31 U/L (ref 0–53)
AST: 22 U/L (ref 0–37)
Albumin: 4.3 g/dL (ref 3.5–5.2)
BILIRUBIN TOTAL: 0.6 mg/dL (ref 0.2–1.2)
BUN: 26 mg/dL — ABNORMAL HIGH (ref 6–23)
CO2: 27 meq/L (ref 19–32)
CREATININE: 1.34 mg/dL (ref 0.40–1.50)
Calcium: 9.9 mg/dL (ref 8.4–10.5)
Chloride: 104 mEq/L (ref 96–112)
GFR: 56.51 mL/min — AB (ref >60.00)
GLUCOSE: 123 mg/dL — AB (ref 70–99)
POTASSIUM: 4.4 meq/L (ref 3.5–5.1)
Sodium: 137 mEq/L (ref 135–145)
TOTAL PROTEIN: 6.8 g/dL (ref 6.0–8.3)

## 2016-07-30 LAB — HEMOGLOBIN A1C: HEMOGLOBIN A1C: 6 % (ref 4.6–6.5)

## 2016-07-30 LAB — TSH: TSH: 1.12 u[IU]/mL (ref 0.35–4.50)

## 2016-07-30 LAB — FOLATE: Folate: >24 ng/mL (ref >5.9)

## 2016-07-30 LAB — VITAMIN B12: Vitamin B-12: 744 pg/mL (ref 211–911)

## 2016-07-30 LAB — PSA: PSA: 0.3 ng/mL (ref 0.10–4.00)

## 2016-07-30 MED ORDER — TELMISARTAN 40 MG PO TABS: 40.0000 mg | ORAL_TABLET | Freq: Every day | ORAL | 1 refills | Status: DC

## 2016-07-30 NOTE — Progress Notes (Signed)
Pre visit review using our clinic review tool, if applicable. No additional management support is needed unless otherwise documented below in the visit note. 

## 2016-07-30 NOTE — Progress Notes (Signed)
Subjective:  Patient ID: Darryl [name], male    DOB: 1950-11-21  Age: 66 y.o. MRN: [id number]  CC: Annual Exam; Hypertension; and Hyperlipidemia   HPI Darryl [name] presents for a CPX.  He feels well, offers no complaints.  Past Medical History:  Diagnosis Date  . Acute upper respiratory infections of unspecified site   . Allergic rhinitis, cause unspecified   . Asthma   . Cataract   . Crohn's colitis ([place])   . Dizziness and giddiness   . Exostosis of unspecified site   . GERD (gastroesophageal reflux disease)   . Hyperplastic colon polyp 02/26/2008  . Hypertension   . Lateral epicondylitis  of elbow   . Nontraumatic rupture of patellar tendon   . Obesity, unspecified   . Regional enteritis of unspecified site   . Vitamin B12 deficiency    Past Surgical History:  Procedure Laterality Date  . TONSILLECTOMY AND ADENOIDECTOMY      reports that he has never smoked. He has never used smokeless tobacco. He reports that he drinks alcohol. He reports that he does not use drugs. family history includes Diabetes in his brother and father; Heart attack in his father; Heart disease in his father; Hypertension in his father. Allergies  Allergen Reactions  . Asacol [Mesalamine] Hives    Trouble swallowing, loss of appetite  . Amoxicillin Rash  . Sulfamethoxazole Rash    Outpatient Medications Prior to Visit  Medication Sig Dispense Refill  . ADVAIR HFA 115-21 MCG/ACT inhaler USE 2 PUFFS TWICE DAILY AS NEEDED. SHAKE WELL 12 g 11  . fluticasone (FLONASE) 50 MCG/ACT nasal spray Place 1 spray into the nose daily. 16 g 6  . folic acid (FOLVITE) 1 MG tablet TAKE 1 TABLET ONCE DAILY. 90 tablet 0  . mercaptopurine (PURINETHOL) 50 MG tablet TAKE 1&1/2 TABLETS EVERY OTHER DAY ALTERNATING WITH 2 TABS EVERY OTHER DAY 90 tablet 9  . montelukast (SINGULAIR) 10 MG tablet TAKE ONE TABLET AT BEDTIME. 30 tablet 11  . pantoprazole (PROTONIX) 40 MG tablet Take 1 tablet (40 mg total) by mouth daily.  Must keep May 8th appt for future refills 30 tablet 0  . PROAIR HFA 108 (90 Base) MCG/ACT inhaler USE 2 PUFFS EVERY 6 HOURS AS NEEDED FOR WHEEZING. **SHAKE WELL** 8.5 g 11  . vitamin B-12 (CYANOCOBALAMIN) 1000 MCG tablet Take 1 tablet (1,000 mcg total) by mouth daily.    . benzonatate (TESSALON) 200 MG capsule Take 1 capsule (200 mg total) by mouth 2 (two) times daily as needed for cough. 30 capsule 0  . EDARBYCLOR 40-25 MG TABS TAKE 1 TABLET ONCE DAILY. 30 tablet 11  . HYDROcodone-homatropine (HYCODAN) 5-1.5 MG/5ML syrup Take 5 mLs by mouth every 8 (eight) hours as needed for cough. 120 mL 0  . Multiple Vitamin (MULTIVITAMIN) tablet Take 1 tablet by mouth daily.    . traMADol (ULTRAM) 50 MG tablet Take 1 tablet (50 mg total) by mouth every 6 (six) hours as needed. 30 tablet 0  . zolpidem (AMBIEN) 10 MG tablet Take 1 tablet (10 mg total) by mouth at bedtime as needed for sleep. 15 tablet 1   No facility-administered medications prior to visit.     ROS Review of Systems  Constitutional: Negative.  Negative for appetite change, diaphoresis, fatigue and unexpected weight change.  HENT: Negative.  Negative for facial swelling and trouble swallowing.   Eyes: Negative.  Negative for visual disturbance.  Respiratory: Negative for cough, chest tightness, shortness of breath  and wheezing.   Cardiovascular: Negative for chest pain, palpitations and leg swelling.  Gastrointestinal: Negative.  Negative for abdominal pain, blood in stool, constipation, diarrhea, nausea and vomiting.  Endocrine: Negative.   Genitourinary: Negative.  Negative for decreased urine volume, difficulty urinating, dysuria, enuresis, genital sores, penile swelling, scrotal swelling and testicular pain.  Musculoskeletal: Negative.  Negative for back pain, myalgias and neck pain.  Skin: Negative.   Allergic/Immunologic: Negative.   Neurological: Negative.  Negative for dizziness, seizures, weakness, light-headedness and numbness.    Hematological: Negative.  Negative for adenopathy. Does not bruise/bleed easily.  Psychiatric/Behavioral: Negative.     Objective:  BP 112/68 (BP Location: Left Arm, Patient Position: Sitting, Cuff Size: Normal)   Pulse 94   Temp 97.8 F (36.6 C) (Oral)   Ht 5' 6"  (0.626 m)   Wt 190 lb 8 oz (86.4 kg)   SpO2 98%   BMI 30.75 kg/m   BP Readings from Last 3 Encounters:  07/30/16 112/68  04/02/16 122/70  09/01/15 122/72    Wt Readings from Last 3 Encounters:  07/30/16 190 lb 8 oz (86.4 kg)  04/02/16 197 lb (89.4 kg)  09/01/15 193 lb (87.5 kg)    Physical Exam  Constitutional: No distress.  HENT:  Mouth/Throat: Oropharynx is clear and moist. No oropharyngeal exudate.  Eyes: Conjunctivae are normal. Right eye exhibits no discharge. Left eye exhibits no discharge. No scleral icterus.  Neck: Normal range of motion. Neck supple. No JVD present. No tracheal deviation present. No thyromegaly present.  Cardiovascular: Normal rate, regular rhythm, normal heart sounds and intact distal pulses.  Exam reveals no gallop and no friction rub.   No murmur heard. Pulmonary/Chest: Effort normal and breath sounds normal. No stridor. No respiratory distress. He has no wheezes. He has no rales. He exhibits no tenderness.  Abdominal: Soft. Bowel sounds are normal. He exhibits no distension and no mass. There is no tenderness. There is no rebound and no guarding. Hernia confirmed negative in the right inguinal area and confirmed negative in the left inguinal area.  Genitourinary: Rectum normal, testes normal and penis normal. Rectal exam shows no external hemorrhoid, no internal hemorrhoid, no fissure, no mass, no tenderness, anal tone normal and guaiac negative stool. Prostate is enlarged (1+ smooth symm BPH). Prostate is not tender. Right testis shows no mass, no swelling and no tenderness. Right testis is descended. Left testis shows no mass, no swelling and no tenderness. Left testis is descended.  Circumcised. No penile erythema or penile tenderness. No discharge found.  Lymphadenopathy:    He has no cervical adenopathy.       Right: No inguinal adenopathy present.       Left: No inguinal adenopathy present.  Skin: He is not diaphoretic.  Vitals reviewed.   Lab Results  Component Value Date   WBC 5.6 07/30/2016   HGB 13.5 07/30/2016   HCT 38.7 (L) 07/30/2016   PLT 326.0 07/30/2016   GLUCOSE 123 (H) 07/30/2016   CHOL 224 (H) 07/30/2016   TRIG 184.0 (H) 07/30/2016   HDL 56.70 07/30/2016   LDLCALC 130 (H) 07/30/2016   ALT 31 07/30/2016   AST 22 07/30/2016   NA 137 07/30/2016   K 4.4 07/30/2016   CL 104 07/30/2016   CREATININE 1.34 07/30/2016   BUN 26 (H) 07/30/2016   CO2 27 07/30/2016   TSH 1.12 07/30/2016   PSA 0.30 07/30/2016   HGBA1C 6.0 07/30/2016    Dg Chest 2 View  Result Date: 09/30/2013  CLINICAL DATA:  Congestion and cough for 11 days, nonsmoker, history asthma EXAM: CHEST  2 VIEW COMPARISON:  None FINDINGS: Enlargement of cardiac silhouette. Mediastinal contours and pulmonary vascularity normal. Lungs grossly clear without infiltrate or effusion. No pneumothorax. Bones demineralized. IMPRESSION: Enlargement of cardiac silhouette. No acute infiltrate. Electronically Signed   By: Lavonia Dana M.D.   On: 09/30/2013 16:12    Assessment & Plan:   Treyden was seen today for annual exam, hypertension and hyperlipidemia.  Diagnoses and all orders for this visit:  Essential hypertension, benign- His blood pressure is over controlled on the ARB and thiazide diuretic combination. I've asked him to stop taking the thiazide diuretic and will try to control his blood pressure solely with the ARB. -     Comprehensive metabolic panel; Future -     Urinalysis, Routine w reflex microscopic; Future -     telmisartan (MICARDIS) 40 MG tablet; Take 1 tablet (40 mg total) by mouth daily.  BPH associated with nocturia- his PSA is not elevated so not concerned about prostate cancer,  he has no symptoms that need to be treated. -     Urinalysis, Routine w reflex microscopic; Future -     PSA; Future  Hyperlipidemia with target LDL less than 130- his Framingham risk score is mildly elevated at 10%, I will discuss with him starting a statin for cardiovascular risk reduction in his follow-up visit. -     Lipid panel; Future -     TSH; Future  Prediabetes- he is prediabetic with an A1c of 6.0%, a medication is not indicated to treat this. -     Hemoglobin A1c; Future  Other vitamin B12 deficiency anemia- he is mildly anemic but his B12 and folate levels are normal, will continue high-dose oral B12 replacement therapy and I have asked him to return in the next few weeks for recheck of the anemia. -     CBC with Differential/Platelet; Future -     Vitamin B12; Future -     Folate; Future  Routine general medical examination at a health care facility   I have discontinued Mr. Guilbault's multivitamin, traMADol, EDARBYCLOR, benzonatate, and HYDROcodone-homatropine. I am also having him start on telmisartan. Additionally, I am having him maintain his fluticasone, vitamin B-12, zolpidem, mercaptopurine, PROAIR HFA, ADVAIR HFA, montelukast, folic acid, and pantoprazole.  Meds ordered this encounter  Medications  . telmisartan (MICARDIS) 40 MG tablet    Sig: Take 1 tablet (40 mg total) by mouth daily.    Dispense:  90 tablet    Refill:  1   See AVS for instructions about healthy living and anticipatory guidance.  Follow-up: Return in about 6 weeks (around 09/10/2016).  Scarlette Calico, MD

## 2016-07-30 NOTE — Patient Instructions (Signed)
 Health Maintenance, Male A healthy lifestyle and preventive care is important for your health and wellness. Ask your health care provider about what schedule of regular examinations is right for you. What should I know about weight and diet?  Eat a Healthy Diet  Eat plenty of vegetables, fruits, whole grains, low-fat dairy products, and lean protein.  Do not eat a lot of foods high in solid fats, added sugars, or salt. Maintain a Healthy Weight  Regular exercise can help you achieve or maintain a healthy weight. You should:  Do at least 150 minutes of exercise each week. The exercise should increase your heart rate and make you sweat (moderate-intensity exercise).  Do strength-training exercises at least twice a week. Watch Your Levels of Cholesterol and Blood Lipids  Have your blood tested for lipids and cholesterol every 5 years starting at 67 years of age. If you are at high risk for heart disease, you should start having your blood tested when you are 67 years old. You may need to have your cholesterol levels checked more often if:  Your lipid or cholesterol levels are high.  You are older than 67 years of age.  You are at high risk for heart disease. What should I know about cancer screening? Many types of cancers can be detected early and may often be prevented. Lung Cancer  You should be screened every year for lung cancer if:  You are a current smoker who has smoked for at least 30 years.  You are a former smoker who has quit within the past 15 years.  Talk to your health care provider about your screening options, when you should start screening, and how often you should be screened. Colorectal Cancer  Routine colorectal cancer screening usually begins at 67 years of age and should be repeated every 5-10 years until you are 67 years old. You may need to be screened more often if early forms of precancerous polyps or small growths are found. Your health care provider  may recommend screening at an earlier age if you have risk factors for colon cancer.  Your health care provider may recommend using home test kits to check for hidden blood in the stool.  A small camera at the end of a tube can be used to examine your colon (sigmoidoscopy or colonoscopy). This checks for the earliest forms of colorectal cancer. Prostate and Testicular Cancer  Depending on your age and overall health, your health care provider may do certain tests to screen for prostate and testicular cancer.  Talk to your health care provider about any symptoms or concerns you have about testicular or prostate cancer. Skin Cancer  Check your skin from head to toe regularly.  Tell your health care provider about any new moles or changes in moles, especially if:  There is a change in a mole's size, shape, or color.  You have a mole that is larger than a pencil eraser.  Always use sunscreen. Apply sunscreen liberally and repeat throughout the day.  Protect yourself by wearing long sleeves, pants, a wide-brimmed hat, and sunglasses when outside. What should I know about heart disease, diabetes, and high blood pressure?  If you are 18-39 years of age, have your blood pressure checked every 3-5 years. If you are 40 years of age or older, have your blood pressure checked every year. You should have your blood pressure measured twice-once when you are at a hospital or clinic, and once when you are not at   a hospital or clinic. Record the average of the two measurements. To check your blood pressure when you are not at a hospital or clinic, you can use:  An automated blood pressure machine at a pharmacy.  A home blood pressure monitor.  Talk to your health care provider about your target blood pressure.  If you are between 45-79 years old, ask your health care provider if you should take aspirin to prevent heart disease.  Have regular diabetes screenings by checking your fasting blood sugar  level.  If you are at a normal weight and have a low risk for diabetes, have this test once every three years after the age of 45.  If you are overweight and have a high risk for diabetes, consider being tested at a younger age or more often.  A one-time screening for abdominal aortic aneurysm (AAA) by ultrasound is recommended for men aged 65-75 years who are current or former smokers. What should I know about preventing infection? Hepatitis B  If you have a higher risk for hepatitis B, you should be screened for this virus. Talk with your health care provider to find out if you are at risk for hepatitis B infection. Hepatitis C  Blood testing is recommended for:  Everyone born from 1945 through 1965.  Anyone with known risk factors for hepatitis C. Sexually Transmitted Diseases (STDs)  You should be screened each year for STDs including gonorrhea and chlamydia if:  You are sexually active and are younger than 67 years of age.  You are older than 67 years of age and your health care provider tells you that you are at risk for this type of infection.  Your sexual activity has changed since you were last screened and you are at an increased risk for chlamydia or gonorrhea. Ask your health care provider if you are at risk.  Talk with your health care provider about whether you are at high risk of being infected with HIV. Your health care provider may recommend a prescription medicine to help prevent HIV infection. What else can I do?  Schedule regular health, dental, and eye exams.  Stay current with your vaccines (immunizations).  Do not use any tobacco products, such as cigarettes, chewing tobacco, and e-cigarettes. If you need help quitting, ask your health care provider.  Limit alcohol intake to no more than 2 drinks per day. One drink equals 12 ounces of beer, 5 ounces of wine, or 1 ounces of hard liquor.  Do not use street drugs.  Do not share needles.  Ask your health  care provider for help if you need support or information about quitting drugs.  Tell your health care provider if you often feel depressed.  Tell your health care provider if you have ever been abused or do not feel safe at home. This information is not intended to replace advice given to you by your health care provider. Make sure you discuss any questions you have with your health care provider. Document Released: 09/07/2007 Document Revised: 11/08/2015 Document Reviewed: 12/13/2014 Elsevier Interactive Patient Education  2017 [employer]  

## 2016-07-31 ENCOUNTER — Encounter: Payer: Self-pay | Admitting: Internal Medicine

## 2016-07-31 NOTE — Assessment & Plan Note (Signed)

## 2016-08-05 ENCOUNTER — Other Ambulatory Visit: Payer: Self-pay | Admitting: Internal Medicine

## 2016-08-07 ENCOUNTER — Other Ambulatory Visit: Payer: Self-pay | Admitting: Internal Medicine

## 2016-08-07 ENCOUNTER — Encounter: Payer: Self-pay | Admitting: Internal Medicine

## 2016-08-07 ENCOUNTER — Ambulatory Visit (INDEPENDENT_AMBULATORY_CARE_PROVIDER_SITE_OTHER): Payer: Medicare Other | Admitting: Internal Medicine

## 2016-08-07 VITALS — BP 108/68 | HR 92 | Ht 65.0 in | Wt 192.4 lb

## 2016-08-07 DIAGNOSIS — Z1283 Encounter for screening for malignant neoplasm of skin: Secondary | ICD-10-CM

## 2016-08-07 DIAGNOSIS — Z79899 Other long term (current) drug therapy: Secondary | ICD-10-CM | POA: Diagnosis not present

## 2016-08-07 DIAGNOSIS — K5010 Crohn's disease of large intestine without complications: Secondary | ICD-10-CM

## 2016-08-07 DIAGNOSIS — K219 Gastro-esophageal reflux disease without esophagitis: Secondary | ICD-10-CM | POA: Diagnosis not present

## 2016-08-07 DIAGNOSIS — E538 Deficiency of other specified B group vitamins: Secondary | ICD-10-CM | POA: Diagnosis not present

## 2016-08-07 MED ORDER — MERCAPTOPURINE 50 MG PO TABS: ORAL_TABLET | ORAL | 9 refills | Status: DC

## 2016-08-07 NOTE — Patient Instructions (Addendum)
Please continue pantoprazole.  We have sent the following medications to your pharmacy for you to pick up at your convenience: Mercaptopurine 75 mg every other day alternating with 100 mg every other day  You will be due for a recall colonoscopy in 01/2017. We will send you a reminder in the mail when it gets closer to that time.  You have been scheduled for an appointment at [facility] with Dr Jarome Matin on Monday, 08/26/16 @ 3:20 pm.  If you are age 20 or older, your body mass index should be between 23-30. Your Body mass index is 32.01 kg/m. If this is out of the aforementioned range listed, please consider follow up with your Primary Care Provider.  If you are age 67 or younger, your body mass index should be between 19-25. Your Body mass index is 32.01 kg/m. If this is out of the aformentioned range listed, please consider follow up with your Primary Care Provider.

## 2016-08-07 NOTE — Progress Notes (Signed)
Subjective:    Patient ID: Darryl [name], male    DOB: 1951/02/09, 66 y.o.   MRN: [id number]  HPI Ladarryl Wrage is a 66 year old male with a history of Crohn's colitis for greater than 2 decades managed on 6-MP and GERD who is here for follow-up. He was last seen in April 2017.  He reports he has been doing very well. He has no colitis symptoms including no bowel habit changes, blood in his stool or melena. No abdominal pain. No tenesmus symptoms. Bowel habits are regular. He continues 6-MP 75 mg alternating with 100 mg daily. Joint pains occur on and off but overall are better than they were in years past. He notices some joint pains if he walks long distances. He did take a bike ride Saturday without joint pains. He rarely uses tramadol if joint pains are severe. He estimates maybe 4 times in the last year.  Reflux symptoms are well-controlled with pantoprazole 40 mg daily. No heartburn, dysphagia or odynophagia.  He recently saw primary care and his antihypertensive regimen was reduced due to mild hypotension. He is now taking telmisartan 40 mg daily. I believe a diuretic was discontinued.  Last colonoscopy was November 2016 which we reviewed together today  Review of Systems As per history of present illness, otherwise negative  Current Medications, Allergies, Past Medical History, Past Surgical History, Family History and Social History were reviewed in [employer] record.      Objective:   Physical Exam BP 108/68 (BP Location: Left Arm, Patient Position: Sitting)   Pulse 92   Ht 5' 5"  (1.651 m) Comment: height measured without shoes  Wt 192 lb 6 oz (87.3 kg)   BMI 32.01 kg/m  Constitutional: Well-developed and well-nourished. No distress. HEENT: Normocephalic and atraumatic. Oropharynx is clear and moist. Conjunctivae are normal.  No scleral icterus. Neck: Neck supple. Trachea midline. Cardiovascular: Normal rate, regular rhythm and intact distal  pulses. No M/R/G Pulmonary/chest: Effort normal and breath sounds normal. No wheezing, rales or rhonchi. Abdominal: Soft, nontender, nondistended. Bowel sounds active throughout.   No hepatosplenomegaly. Extremities: no clubbing, cyanosis, or edema Neurological: Alert and oriented to person place and time. Skin: Skin is warm and dry. Psychiatric: Normal mood and affect. Behavior is normal.  CBC    Component Value Date/Time   WBC 5.6 07/30/2016 1021   RBC 3.64 (L) 07/30/2016 1021   HGB 13.5 07/30/2016 1021   HCT 38.7 (L) 07/30/2016 1021   PLT 326.0 07/30/2016 1021   MCV 106.4 (H) 07/30/2016 1021   MCHC 34.9 07/30/2016 1021   RDW 14.8 07/30/2016 1021   LYMPHSABS 1.7 07/30/2016 1021   MONOABS 0.4 07/30/2016 1021   EOSABS 0.0 07/30/2016 1021   BASOSABS 0.0 07/30/2016 1021   CMP     Component Value Date/Time   NA 137 07/30/2016 1021   K 4.4 07/30/2016 1021   CL 104 07/30/2016 1021   CO2 27 07/30/2016 1021   GLUCOSE 123 (H) 07/30/2016 1021   BUN 26 (H) 07/30/2016 1021   CREATININE 1.34 07/30/2016 1021   CALCIUM 9.9 07/30/2016 1021   PROT 6.8 07/30/2016 1021   ALBUMIN 4.3 07/30/2016 1021   AST 22 07/30/2016 1021   ALT 31 07/30/2016 1021   ALKPHOS 56 07/30/2016 1021   BILITOT 0.6 07/30/2016 1021   GFRNONAA 72.63 04/27/2009 0848   GFRAA 99 02/12/2008 0935   Lab Results  Component Value Date   VITAMINB12 744 07/30/2016   Folate normal  Assessment & Plan:  66 year old male with a history of Crohn's colitis for greater than 2 decades managed on 6-MP and GERD who is here for follow-up.   1. Crohn's colitis, long-standing -- in remission on 6-MP. Continue 6-MP daily 75 mg alternating with 100 mg. Blood counts and liver enzymes remain normal. Surveillance colonoscopy recommended November 2018. We discussed the risk benefits and alternatives and he is agreeable to proceed at that time. Annual flu vaccine recommended this fall. Up-to-date with pneumococcal vaccine. Prior  attempt to discontinue immunomodulator therapy resulted in a flare of his disease.  2. Joint pains -- has been seen by rheumatology in the past. These are well-controlled at present. I expect there is some element of IBD arthropathy on top of osteoarthritis.  3. B12 deficiency -- within normal limits on oral B12. Continue 1000 g daily  4. Skin cancer screening -- has been sometime since he's been seen by dermatology. I recommended that he be seen for skin check particularly given IBD on immunosuppressive therapy. He will be referred to [facility] dermatology.  5. GERD -- well-controlled on pantoprazole without alarm symptoms. We'll continue pantoprazole 40 mg once daily.  Annual office follow-up though for colonoscopy in November 25 minutes spent with the patient today. Greater than 50% was spent in counseling and coordination of care with the patient

## 2016-08-16 ENCOUNTER — Other Ambulatory Visit: Payer: Self-pay | Admitting: Internal Medicine

## 2016-11-13 ENCOUNTER — Other Ambulatory Visit: Payer: Self-pay | Admitting: Internal Medicine

## 2016-12-01 ENCOUNTER — Other Ambulatory Visit: Payer: Self-pay | Admitting: Internal Medicine

## 2016-12-01 DIAGNOSIS — J45901 Unspecified asthma with (acute) exacerbation: Secondary | ICD-10-CM

## 2016-12-29 ENCOUNTER — Other Ambulatory Visit: Payer: Self-pay | Admitting: Internal Medicine

## 2017-01-07 ENCOUNTER — Ambulatory Visit (INDEPENDENT_AMBULATORY_CARE_PROVIDER_SITE_OTHER): Payer: Medicare Other | Admitting: General Practice

## 2017-01-07 VITALS — BP 134/86

## 2017-01-07 DIAGNOSIS — Z0130 Encounter for examination of blood pressure without abnormal findings: Secondary | ICD-10-CM

## 2017-01-07 DIAGNOSIS — Z23 Encounter for immunization: Secondary | ICD-10-CM | POA: Diagnosis not present

## 2017-01-09 ENCOUNTER — Other Ambulatory Visit: Payer: Self-pay | Admitting: Internal Medicine

## 2017-01-09 DIAGNOSIS — I10 Essential (primary) hypertension: Secondary | ICD-10-CM

## 2017-02-03 ENCOUNTER — Other Ambulatory Visit: Payer: Self-pay | Admitting: Internal Medicine

## 2017-02-24 ENCOUNTER — Encounter: Payer: Self-pay | Admitting: Internal Medicine

## 2017-03-24 ENCOUNTER — Other Ambulatory Visit: Payer: Self-pay | Admitting: Internal Medicine

## 2017-03-24 ENCOUNTER — Telehealth: Payer: Self-pay | Admitting: Internal Medicine

## 2017-03-24 DIAGNOSIS — F40243 Fear of flying: Secondary | ICD-10-CM

## 2017-03-24 NOTE — Telephone Encounter (Signed)
Copied from [place] [phone]. Topic: Quick Communication - Rx Refill/Question >> Mar 24, 2017 12:15 PM Malena Catholic I, NT wrote: Has the patient contacted their pharmacy yes    (Agent: If no, request that the patient contact the pharmacy for the refill Tramadol 50 Mg Ambien 10 Mg    Preferred Pharmacy (with phone number or street name [facility] [phone]   Agent: Please be advised that RX refills may take up to 3 business days. We ask that you follow-up with your pharmacy.

## 2017-03-26 NOTE — Telephone Encounter (Signed)
erx sent

## 2017-03-27 NOTE — Telephone Encounter (Signed)
Spoke with patient this morning to do a  follow up call on previous refill request. Patient was appreciative and stated that he did pick up prescriptions this morning and was all set.

## 2017-04-15 ENCOUNTER — Other Ambulatory Visit: Payer: Self-pay | Admitting: Internal Medicine

## 2017-04-15 DIAGNOSIS — I10 Essential (primary) hypertension: Secondary | ICD-10-CM

## 2017-04-16 ENCOUNTER — Ambulatory Visit: Payer: Medicare Other | Admitting: Internal Medicine

## 2017-04-21 ENCOUNTER — Encounter: Payer: Self-pay | Admitting: Internal Medicine

## 2017-04-21 ENCOUNTER — Ambulatory Visit: Payer: Medicare Other | Admitting: Internal Medicine

## 2017-04-21 ENCOUNTER — Other Ambulatory Visit (INDEPENDENT_AMBULATORY_CARE_PROVIDER_SITE_OTHER): Payer: Medicare Other

## 2017-04-21 VITALS — BP 124/82 | HR 75 | Temp 97.7°F | Resp 16 | Ht 65.0 in | Wt 194.0 lb

## 2017-04-21 DIAGNOSIS — J4520 Mild intermittent asthma, uncomplicated: Secondary | ICD-10-CM

## 2017-04-21 DIAGNOSIS — D510 Vitamin B12 deficiency anemia due to intrinsic factor deficiency: Secondary | ICD-10-CM

## 2017-04-21 DIAGNOSIS — R7303 Prediabetes: Secondary | ICD-10-CM | POA: Diagnosis not present

## 2017-04-21 DIAGNOSIS — I10 Essential (primary) hypertension: Secondary | ICD-10-CM

## 2017-04-21 LAB — CBC WITH DIFFERENTIAL/PLATELET
BASOS PCT: 0.7 % (ref 0.0–3.0)
Basophils Absolute: 0.1 10*3/uL (ref 0.0–0.1)
EOS ABS: 0 10*3/uL (ref 0.0–0.7)
EOS PCT: 0.4 % (ref 0.0–5.0)
HEMATOCRIT: 43.1 % (ref 39.0–52.0)
Hemoglobin: 14.8 g/dL (ref 13.0–17.0)
LYMPHS PCT: 28.9 % (ref 12.0–46.0)
Lymphs Abs: 2.1 10*3/uL (ref 0.7–4.0)
MCHC: 34.2 g/dL (ref 30.0–36.0)
MCV: 106.3 fl — ABNORMAL HIGH (ref 78.0–100.0)
MONO ABS: 0.6 10*3/uL (ref 0.1–1.0)
Monocytes Relative: 8.9 % (ref 3.0–12.0)
NEUTROS ABS: 4.4 10*3/uL (ref 1.4–7.7)
Neutrophils Relative %: 61.1 % (ref 43.0–77.0)
PLATELETS: 272 10*3/uL (ref 150.0–400.0)
RBC: 4.05 Mil/uL — ABNORMAL LOW (ref 4.22–5.81)
RDW: 14.4 % (ref 11.5–15.5)
WBC: 7.2 10*3/uL (ref 4.0–10.5)

## 2017-04-21 LAB — BASIC METABOLIC PANEL
BUN: 25 mg/dL — ABNORMAL HIGH (ref 6–23)
CALCIUM: 9.5 mg/dL (ref 8.4–10.5)
CO2: 29 meq/L (ref 19–32)
CREATININE: 1.13 mg/dL (ref 0.40–1.50)
Chloride: 104 mEq/L (ref 96–112)
GFR: 68.64 mL/min (ref >60.00)
GLUCOSE: 106 mg/dL — AB (ref 70–99)
Potassium: 4.3 mEq/L (ref 3.5–5.1)
Sodium: 140 mEq/L (ref 135–145)

## 2017-04-21 LAB — HEMOGLOBIN A1C: HEMOGLOBIN A1C: 6.1 % (ref 4.6–6.5)

## 2017-04-21 MED ORDER — TELMISARTAN 40 MG PO TABS
40.0000 mg | ORAL_TABLET | Freq: Every day | ORAL | 1 refills | Status: DC
Start: 2017-04-21 — End: 2017-10-29

## 2017-04-21 MED ORDER — FLUTICASONE-SALMETEROL 115-21 MCG/ACT IN AERO: INHALATION_SPRAY | RESPIRATORY_TRACT | 1 refills | Status: DC

## 2017-04-21 NOTE — Patient Instructions (Signed)

## 2017-04-21 NOTE — Progress Notes (Signed)
Subjective:  Patient ID: Darryl [name], male    DOB: September 27, 1950  Age: 68 y.o. MRN: [id number]  CC: Hypertension; Asthma; and Anemia   HPI [name] presents for a BP check - He tells me his blood pressure has been well controlled.  He feels well today and offers no complaints.  His asthma symptoms have been well controlled with the LABA/ICS combination.  He denies any recent episodes of CP, DOE, cough, shortness of breath, or wheezing.  Outpatient Medications Prior to Visit  Medication Sig Dispense Refill  . fluticasone (FLONASE) 50 MCG/ACT nasal spray Place 1 spray into the nose daily. 16 g 6  . folic acid (FOLVITE) 1 MG tablet TAKE 1 TABLET ONCE DAILY. 90 tablet 1  . mercaptopurine (PURINETHOL) 50 MG tablet TAKE 1&1/2 TABLETS EVERY OTHER DAY ALTERNATING WITH 2 TABS EVERY OTHER DAY 90 tablet 9  . montelukast (SINGULAIR) 10 MG tablet TAKE ONE TABLET AT BEDTIME. 90 tablet 0  . pantoprazole (PROTONIX) 40 MG tablet TAKE 1 TABLET ONCE DAILY. 30 tablet 11  . VENTOLIN HFA 108 (90 Base) MCG/ACT inhaler USE 2 PUFFS EVERY 6 HOURS AS NEEDED FOR WHEEZING. **SHAKE WELL** 18 g 2  . vitamin B-12 (CYANOCOBALAMIN) 1000 MCG tablet Take 1 tablet (1,000 mcg total) by mouth daily.    Marland Kitchen zolpidem (AMBIEN) 10 MG tablet TAKE ONE TABLET AT BEDTIME. 15 tablet 0  . ADVAIR HFA 115-21 MCG/ACT inhaler USE 2 PUFFS TWICE DAILY AS NEEDED. SHAKE WELL 12 g 1  . telmisartan (MICARDIS) 40 MG tablet TAKE 1 TABLET ONCE DAILY. 90 tablet 0  . traMADol (ULTRAM) 50 MG tablet TAKE ONE TABLET EVERY 6 HOURS AS NEEDED. 30 tablet 2   No facility-administered medications prior to visit.     ROS Review of Systems  Constitutional: Negative.  Negative for appetite change, diaphoresis, fatigue and unexpected weight change.  HENT: Negative.   Eyes: Negative.  Negative for visual disturbance.  Respiratory: Negative for cough, chest tightness, shortness of breath and wheezing.   Cardiovascular: Negative for chest pain and leg  swelling.  Gastrointestinal: Negative for abdominal pain, constipation, diarrhea, nausea and vomiting.  Endocrine: Negative.   Genitourinary: Negative.  Negative for difficulty urinating.  Musculoskeletal: Negative.  Negative for arthralgias and myalgias.  Skin: Negative.  Negative for color change.  Allergic/Immunologic: Negative.   Neurological: Negative.  Negative for dizziness, weakness and light-headedness.  Hematological: Negative for adenopathy. Does not bruise/bleed easily.  Psychiatric/Behavioral: Negative.     Objective:  BP 124/82 (BP Location: Left Arm, Patient Position: Sitting, Cuff Size: Normal)   Pulse 75   Temp 97.7 F (36.5 C) (Oral)   Resp 16   Ht 5' 5"  (1.651 m)   Wt 194 lb 0.6 oz (88 kg)   SpO2 96%   BMI 32.29 kg/m   BP Readings from Last 3 Encounters:  04/21/17 124/82  01/07/17 134/86  08/07/16 108/68    Wt Readings from Last 3 Encounters:  04/21/17 194 lb 0.6 oz (88 kg)  08/07/16 192 lb 6 oz (87.3 kg)  07/30/16 190 lb 8 oz (86.4 kg)    Physical Exam  Constitutional: He is oriented to person, place, and time. No distress.  HENT:  Mouth/Throat: Oropharynx is clear and moist. No oropharyngeal exudate.  Eyes: Conjunctivae are normal. Left eye exhibits no discharge. No scleral icterus.  Neck: Normal range of motion. Neck supple. No JVD present. No thyromegaly present.  Cardiovascular: Normal rate, regular rhythm and normal heart sounds.  Pulmonary/Chest:  Effort normal and breath sounds normal. No respiratory distress. He has no wheezes. He has no rales.  Abdominal: Bowel sounds are normal. He exhibits no distension and no mass. There is no tenderness.  Musculoskeletal: Normal range of motion. He exhibits no edema, tenderness or deformity.  Lymphadenopathy:    He has no cervical adenopathy.  Neurological: He is alert and oriented to person, place, and time.  Skin: Skin is warm and dry. No rash noted. He is not diaphoretic. No erythema. No pallor.    Vitals reviewed.   Lab Results  Component Value Date   WBC 7.2 04/21/2017   HGB 14.8 04/21/2017   HCT 43.1 04/21/2017   PLT 272.0 04/21/2017   GLUCOSE 106 (H) 04/21/2017   CHOL 224 (H) 07/30/2016   TRIG 184.0 (H) 07/30/2016   HDL 56.70 07/30/2016   LDLCALC 130 (H) 07/30/2016   ALT 31 07/30/2016   AST 22 07/30/2016   NA 140 04/21/2017   K 4.3 04/21/2017   CL 104 04/21/2017   CREATININE 1.13 04/21/2017   BUN 25 (H) 04/21/2017   CO2 29 04/21/2017   TSH 1.12 07/30/2016   PSA 0.30 07/30/2016   HGBA1C 6.1 04/21/2017    Dg Chest 2 View  Result Date: 09/30/2013 CLINICAL DATA:  Congestion and cough for 11 days, nonsmoker, history asthma EXAM: CHEST  2 VIEW COMPARISON:  None FINDINGS: Enlargement of cardiac silhouette. Mediastinal contours and pulmonary vascularity normal. Lungs grossly clear without infiltrate or effusion. No pneumothorax. Bones demineralized. IMPRESSION: Enlargement of cardiac silhouette. No acute infiltrate. Electronically Signed   By: Lavonia Dana M.D.   On: 09/30/2013 16:12    Assessment & Plan:   Burnis was seen today for hypertension, asthma and anemia.  Diagnoses and all orders for this visit:  Essential hypertension, benign- His blood pressure is well controlled.  Electrolytes and renal function are normal. -     Basic metabolic panel; Future -     telmisartan (MICARDIS) 40 MG tablet; Take 1 tablet (40 mg total) by mouth daily.  Prediabetes- His A1c is up to 6.1%.  Medical therapy is not indicated. -     Hemoglobin A1c; Future  Vitamin B12 deficiency anemia due to intrinsic factor deficiency- Improvement noted.  Will continue high-dose oral B12 replacement therapy. -     CBC with Differential/Platelet; Future  Asthma in adult, mild intermittent, uncomplicated- He is doing well on the LABA/ICS combination.  Will continue at the current dose. -     CBC with Differential/Platelet; Future -     fluticasone-salmeterol (ADVAIR HFA) 115-21 MCG/ACT inhaler;  USE 2 PUFFS TWICE DAILY AS NEEDED. SHAKE WELL   I have discontinued Martel Galvan. [employer]'s traMADol. I have changed his ADVAIR HFA to fluticasone-salmeterol. I have also changed his telmisartan. Additionally, I am having him maintain his fluticasone, vitamin B-12, mercaptopurine, pantoprazole, folic acid, VENTOLIN HFA, montelukast, and zolpidem.  Meds ordered this encounter  Medications  . telmisartan (MICARDIS) 40 MG tablet    Sig: Take 1 tablet (40 mg total) by mouth daily.    Dispense:  90 tablet    Refill:  1  . fluticasone-salmeterol (ADVAIR HFA) 115-21 MCG/ACT inhaler    Sig: USE 2 PUFFS TWICE DAILY AS NEEDED. SHAKE WELL    Dispense:  12 g    Refill:  1     Follow-up: Return in about 6 months (around 10/19/2017).  Scarlette Calico, MD

## 2017-05-03 ENCOUNTER — Other Ambulatory Visit: Payer: Self-pay | Admitting: Internal Medicine

## 2017-05-16 ENCOUNTER — Other Ambulatory Visit: Payer: Self-pay | Admitting: Internal Medicine

## 2017-05-21 ENCOUNTER — Ambulatory Visit: Payer: Medicare Other | Admitting: Family

## 2017-05-21 ENCOUNTER — Encounter: Payer: Self-pay | Admitting: Family

## 2017-05-21 VITALS — BP 126/82 | HR 86 | Temp 98.4°F | Ht 65.0 in | Wt 190.0 lb

## 2017-05-21 DIAGNOSIS — J209 Acute bronchitis, unspecified: Secondary | ICD-10-CM | POA: Diagnosis not present

## 2017-05-21 MED ORDER — LEVOFLOXACIN 500 MG PO TABS: 500.0000 mg | ORAL_TABLET | Freq: Every day | ORAL | 0 refills | Status: DC

## 2017-05-21 NOTE — Progress Notes (Signed)
Darryl [name] is a 67 y.o. male with the following history as recorded in Epic[facility]:  Patient Active Problem List   Diagnosis Date Noted  . Hyperlipidemia with target LDL less than 130 07/04/2015  . BPH associated with nocturia 07/04/2015  . Fear of flying 07/04/2015  . Essential hypertension, benign 12/01/2013  . Routine general medical examination at a health care facility 05/19/2013  . Screening, heart disease, ischemic 05/19/2013  . CROHN'S DISEASE, LARGE INTESTINE 04/27/2009  . Prediabetes 03/09/2008  . Vitamin B12 deficiency anemia 02/08/2008  . GERD 02/08/2008  . OBESITY, NOS 05/22/2006  . Asthma in adult, mild intermittent, uncomplicated 91/79/1505    Current Outpatient Medications  Medication Sig Dispense Refill  . fluticasone (FLONASE) 50 MCG/ACT nasal spray Place 1 spray into the nose daily. 16 g 6  . fluticasone-salmeterol (ADVAIR HFA) 115-21 MCG/ACT inhaler USE 2 PUFFS TWICE DAILY AS NEEDED. SHAKE WELL 12 g 1  . folic acid (FOLVITE) 1 MG tablet TAKE 1 TABLET ONCE DAILY. 90 tablet 1  . mercaptopurine (PURINETHOL) 50 MG tablet TAKE 1&1/2 TABLETS EVERY OTHER DAY ALTERNATING WITH 2 TABS EVERY OTHER DAY 90 tablet 9  . montelukast (SINGULAIR) 10 MG tablet TAKE ONE TABLET AT BEDTIME. 90 tablet 1  . pantoprazole (PROTONIX) 40 MG tablet TAKE 1 TABLET ONCE DAILY. 30 tablet 11  . telmisartan (MICARDIS) 40 MG tablet Take 1 tablet (40 mg total) by mouth daily. 90 tablet 1  . VENTOLIN HFA 108 (90 Base) MCG/ACT inhaler USE 2 PUFFS EVERY 6 HOURS AS NEEDED FOR WHEEZING. **SHAKE WELL** 18 g 2  . vitamin B-12 (CYANOCOBALAMIN) 1000 MCG tablet Take 1 tablet (1,000 mcg total) by mouth daily.    Marland Kitchen zolpidem (AMBIEN) 10 MG tablet TAKE ONE TABLET AT BEDTIME. 15 tablet 0  . levofloxacin (LEVAQUIN) 500 MG tablet Take 1 tablet (500 mg total) by mouth daily. 7 tablet 0   No current facility-administered medications for this visit.     Allergies: Asacol [mesalamine]; Amoxicillin; and Sulfamethoxazole   Past Medical History:  Diagnosis Date  . Acute upper respiratory infections of unspecified site   . Allergic rhinitis, cause unspecified   . Asthma   . Cataract   . Crohn's colitis ([place])   . Dizziness and giddiness   . Exostosis of unspecified site   . GERD (gastroesophageal reflux disease)   . Hyperplastic colon polyp 02/26/2008  . Hypertension   . Lateral epicondylitis  of elbow   . Nontraumatic rupture of patellar tendon   . Obesity, unspecified   . Regional enteritis of unspecified site   . Vitamin B12 deficiency     Past Surgical History:  Procedure Laterality Date  . TONSILLECTOMY AND ADENOIDECTOMY      Family History  Problem Relation Age of Onset  . Diabetes Brother   . Diabetes Father   . Heart attack Father   . Hypertension Father   . Heart disease Father   . Colon cancer Neg Hx   . Stomach cancer Neg Hx   . Hyperlipidemia Neg Hx   . Sudden death Neg Hx   . Cancer Neg Hx   . COPD Neg Hx   . Alcohol abuse Neg Hx   . Drug abuse Neg Hx   . Stroke Neg Hx   . Rectal cancer Neg Hx   . Esophageal cancer Neg Hx     Social History   Tobacco Use  . Smoking status: Never Smoker  . Smokeless tobacco: Never Used  Substance Use Topics  .  Alcohol use: Yes    Alcohol/week: 0.0 oz    Comment: rare    Subjective:  Patient presents with concerns for possible bronchitis; symptoms x 6 days; + productive cough- dark mucus; denies any chest pain but does feel some shortness of breath; has underlying asthma; using Advair regularly but is needing rescue inhaler more in the past few days; woke up in a cold sweat last night; using Advil Cold and Sinus and Robitussin DM; notes that does not feel that Z-pak works well for him; has used Levaquin in the past with good relief; also, cannot tolerate oral steroids;   Objective:  Vitals:   05/21/17 0941  BP: 126/82  Pulse: 86  Temp: 98.4 F (36.9 C)  TempSrc: Oral  SpO2: 97%  Weight: 190 lb (86.2 kg)  Height: 5' 5"  (1.651 m)     General: Well developed, well nourished, in no acute distress  Skin : Warm and dry.  Head: Normocephalic and atraumatic  Eyes: Sclera and conjunctiva clear; pupils round and reactive to light; extraocular movements intact  Ears: External normal; canals clear; tympanic membranes normal  Oropharynx: Pink, supple. No suspicious lesions  Neck: Supple without thyromegaly, adenopathy  Lungs: Respirations unlabored; clear to auscultation bilaterally without wheeze, rales, rhonchi  CVS exam: normal rate and regular rhythm.  Neurologic: Alert and oriented; speech intact; face symmetrical; moves all extremities well; CNII-XII intact without focal deficit   Assessment:  1. Acute bronchitis, unspecified organism     Plan:  Rx for Levaquin 500 mg qd x 7 days; continue Advair as prescribed; increase fluids, rest; follow-up worse, no better.   No Follow-up on file.  No orders of the defined types were placed in this encounter.   Requested Prescriptions   Signed Prescriptions Disp Refills  . levofloxacin (LEVAQUIN) 500 MG tablet 7 tablet 0    Sig: Take 1 tablet (500 mg total) by mouth daily.

## 2017-07-06 ENCOUNTER — Other Ambulatory Visit: Payer: Self-pay | Admitting: Internal Medicine

## 2017-07-06 DIAGNOSIS — J4520 Mild intermittent asthma, uncomplicated: Secondary | ICD-10-CM

## 2017-07-06 DIAGNOSIS — J45901 Unspecified asthma with (acute) exacerbation: Secondary | ICD-10-CM

## 2017-07-22 ENCOUNTER — Encounter: Payer: Self-pay | Admitting: Internal Medicine

## 2017-08-14 ENCOUNTER — Other Ambulatory Visit: Payer: Self-pay | Admitting: Internal Medicine

## 2017-08-29 ENCOUNTER — Other Ambulatory Visit: Payer: Self-pay | Admitting: *Deleted

## 2017-09-10 ENCOUNTER — Other Ambulatory Visit: Payer: Self-pay | Admitting: Internal Medicine

## 2017-09-10 ENCOUNTER — Other Ambulatory Visit: Payer: Self-pay | Admitting: *Deleted

## 2017-09-10 MED ORDER — MERCAPTOPURINE 50 MG PO TABS: ORAL_TABLET | ORAL | 0 refills | Status: DC

## 2017-09-16 ENCOUNTER — Ambulatory Visit (AMBULATORY_SURGERY_CENTER): Payer: Self-pay | Admitting: *Deleted

## 2017-09-16 ENCOUNTER — Other Ambulatory Visit: Payer: Self-pay

## 2017-09-16 ENCOUNTER — Encounter: Payer: Self-pay | Admitting: Internal Medicine

## 2017-09-16 VITALS — Ht 66.0 in | Wt 192.6 lb

## 2017-09-16 DIAGNOSIS — Z86010 Personal history of colonic polyps: Secondary | ICD-10-CM

## 2017-09-16 DIAGNOSIS — K5090 Crohn's disease, unspecified, without complications: Secondary | ICD-10-CM

## 2017-09-16 MED ORDER — NA SULFATE-K SULFATE-MG SULF 17.5-3.13-1.6 GM/177ML PO SOLN: 1.0000 | Freq: Once | ORAL | 0 refills | Status: AC

## 2017-09-16 NOTE — Progress Notes (Signed)
Denies allergies to eggs or soy products. Denies complications with sedation or anesthesia. Denies O2 use. Denies use of diet or weight loss medications.  Emmi instructions not given for colonoscopy, pt declined. Has seen previously.

## 2017-09-30 ENCOUNTER — Ambulatory Visit (AMBULATORY_SURGERY_CENTER): Payer: Medicare Other | Admitting: Internal Medicine

## 2017-09-30 ENCOUNTER — Encounter: Payer: Self-pay | Admitting: Internal Medicine

## 2017-09-30 VITALS — BP 111/79 | HR 67 | Temp 97.8°F | Resp 16 | Ht 60.0 in | Wt 190.0 lb

## 2017-09-30 DIAGNOSIS — K5010 Crohn's disease of large intestine without complications: Secondary | ICD-10-CM

## 2017-09-30 DIAGNOSIS — D129 Benign neoplasm of anus and anal canal: Secondary | ICD-10-CM

## 2017-09-30 DIAGNOSIS — D122 Benign neoplasm of ascending colon: Secondary | ICD-10-CM | POA: Diagnosis not present

## 2017-09-30 DIAGNOSIS — D128 Benign neoplasm of rectum: Secondary | ICD-10-CM

## 2017-09-30 DIAGNOSIS — D120 Benign neoplasm of cecum: Secondary | ICD-10-CM | POA: Diagnosis not present

## 2017-09-30 DIAGNOSIS — K621 Rectal polyp: Secondary | ICD-10-CM | POA: Diagnosis not present

## 2017-09-30 LAB — HM COLONOSCOPY

## 2017-09-30 MED ORDER — SODIUM CHLORIDE 0.9 % IV SOLN: 500.0000 mL | Freq: Once | INTRAVENOUS | Status: DC

## 2017-09-30 NOTE — Patient Instructions (Signed)
YOU HAD AN ENDOSCOPIC PROCEDURE TODAY AT [facility]:   Refer to the procedure report that was given to you for any specific questions about what was found during the examination.  If the procedure report does not answer your questions, please call your gastroenterologist to clarify.  If you requested that your care partner not be given the details of your procedure findings, then the procedure report has been included in a sealed envelope for you to review at your convenience later.  YOU SHOULD EXPECT: Some feelings of bloating in the abdomen. Passage of more gas than usual.  Walking can help get rid of the air that was put into your GI tract during the procedure and reduce the bloating. If you had a lower endoscopy (such as a colonoscopy or flexible sigmoidoscopy) you may notice spotting of blood in your stool or on the toilet paper. If you underwent a bowel prep for your procedure, you may not have a normal bowel movement for a few days.  Please Note:  You might notice some irritation and congestion in your nose or some drainage.  This is from the oxygen used during your procedure.  There is no need for concern and it should clear up in a day or so.  SYMPTOMS TO REPORT IMMEDIATELY:   Following lower endoscopy (colonoscopy or flexible sigmoidoscopy):  Excessive amounts of blood in the stool  Significant tenderness or worsening of abdominal pains  Swelling of the abdomen that is new, acute  Fever of 100F or higher   For urgent or emergent issues, a gastroenterologist can be reached at any hour by calling [phone].   DIET:  We do recommend a small meal at first, but then you may proceed to your regular diet.  Drink plenty of fluids but you should avoid alcoholic beverages for 24 hours.  ACTIVITY:  You should plan to take it easy for the rest of today and you should NOT DRIVE or use heavy machinery until tomorrow (because of the sedation medicines used during the test).     FOLLOW UP: Our staff will call the number listed on your records the next business day following your procedure to check on you and address any questions or concerns that you may have regarding the information given to you following your procedure. If we do not reach you, we will leave a message.  However, if you are feeling well and you are not experiencing any problems, there is no need to return our call.  We will assume that you have returned to your regular daily activities without incident.  If any biopsies were taken you will be contacted by phone or by letter within the next 1-3 weeks.  Please call us at [phone] if you have not heard about the biopsies in 3 weeks.    SIGNATURES/CONFIDENTIALITY: You and/or your care partner have signed paperwork which will be entered into your electronic medical record.  These signatures attest to the fact that that the information above on your After Visit Summary has been reviewed and is understood.  Full responsibility of the confidentiality of this discharge information lies with you and/or your care-partner.    Handouts were given to your care partner on polyps and diverticulosis. You may resume your current medications today. Await biopsy results. Repeat colonoscopy in 2 years for surveillance. Please call if any questions or concerns.

## 2017-09-30 NOTE — Progress Notes (Signed)
Called to room to assist during endoscopic procedure.  Patient ID and intended procedure confirmed with present staff. Received instructions for my participation in the procedure from the performing physician.  

## 2017-09-30 NOTE — Progress Notes (Signed)
Report to PACU, RN, vss, BBS= Clear.  

## 2017-09-30 NOTE — Progress Notes (Signed)
Pt's states no medical or surgical changes since previsit or office visit. 

## 2017-09-30 NOTE — Op Note (Signed)
[facility] Patient Name: Darryl [name] Procedure Date: 09/30/2017 8:08 AM MRN: [id number] Endoscopist: Jerene Bears , MD Age: 67 Referring MD:  Date of Birth: 23-Apr-1950 Gender: Male Account #: [id number] Procedure:                Colonoscopy Indications:              High risk colon cancer surveillance: Crohn's                            colitis of 8 (or more) years duration, Last                            colonoscopy: November 2016 Medicines:                Monitored Anesthesia Care Procedure:                Pre-Anesthesia Assessment:                           - Prior to the procedure, a History and Physical                            was performed, and patient medications and                            allergies were reviewed. The patient's tolerance of                            previous anesthesia was also reviewed. The risks                            and benefits of the procedure and the sedation                            options and risks were discussed with the patient.                            All questions were answered, and informed consent                            was obtained. Prior Anticoagulants: The patient has                            taken no previous anticoagulant or antiplatelet                            agents. ASA Grade Assessment: II - A patient with                            mild systemic disease. After reviewing the risks                            and benefits, the patient was deemed in  satisfactory condition to undergo the procedure.                           After obtaining informed consent, the colonoscope                            was passed under direct vision. Throughout the                            procedure, the patient's blood pressure, pulse, and                            oxygen saturations were monitored continuously. The                            Model CF-HQ190L [id number]) scope was introduced                             through the anus and advanced to the terminal                            ileum. The colonoscopy was performed without                            difficulty. The patient tolerated the procedure                            well. The quality of the bowel preparation was                            good. The terminal ileum, ileocecal valve,                            appendiceal orifice, and rectum were photographed. Scope In: 8:14:23 AM Scope Out: 8:33:29 AM Scope Withdrawal Time: 0 hours 16 minutes 26 seconds  Total Procedure Duration: 0 hours 19 minutes 6 seconds  Findings:                 The digital rectal exam findings include mild anal                            stenosis.                           The terminal ileum appeared normal.                           A 5 mm polyp was found in the cecum. The polyp was                            sessile. The polyp was removed with a cold snare.                            Resection and retrieval were complete.  A 8 mm polyp was found in the ascending colon. The                            polyp was sessile. The polyp was removed with a                            cold snare. Resection and retrieval were complete.                           A 3 mm polyp was found in the rectum. The polyp was                            sessile. The polyp was removed with a cold snare.                            Resection and retrieval were complete.                           The Simple Endoscopic Score for Crohn's Disease was                            determined based on the endoscopic appearance of                            the mucosa in the following segments:                           - Ileum: Findings include no ulcers present, no                            ulcerated surfaces, no affected surfaces and no                            narrowings. Segment score: 0.                           - Right Colon: Findings  include no ulcers present,                            no ulcerated surfaces, no affected surfaces and no                            narrowings. Segment score: 0.                           - Transverse Colon: Findings include no ulcers                            present, no ulcerated surfaces, no affected                            surfaces and no narrowings. Segment score: 0.                           -  Left Colon: Findings include no ulcers present,                            no ulcerated surfaces, no affected surfaces and no                            narrowings. Segment score: 0.                           - Rectum: Findings include no ulcers present, no                            ulcerated surfaces, no affected surfaces and no                            narrowings. Segment score: 0.                           - Total SES-CD aggregate score: 0. Four biopsies                            were taken every 10 cm with a cold forceps from the                            entire colon for Crohn's disease surveillance and                            dysplasia surveillance. These biopsy specimens from                            the right colon and left colon were sent to                            Pathology.                           A few small-mouthed diverticula were found in the                            sigmoid colon.                           Retroflexion in the rectum was not performed due to                            narrow rectal vault. Complications:            No immediate complications. Estimated Blood Loss:     Estimated blood loss was minimal. Impression:               - Anal stenosis (mild) found on digital rectal exam.                           - The examined portion of the ileum was normal.                           -  One 5 mm polyp in the cecum, removed with a cold                            snare. Resected and retrieved.                           - One 8 mm polyp in the ascending  colon, removed                            with a cold snare. Resected and retrieved.                           - One 3 mm polyp in the rectum, removed with a cold                            snare. Resected and retrieved.                           - Simple Endoscopic Score for Crohn's Disease: 0,                            mucosal inflammatory changes secondary to quiescent                            Crohn's disease. Mucosal scarring without overt                            activity. Biopsied.                           - Diverticulosis in the sigmoid colon. Recommendation:           - Patient has a contact number available for                            emergencies. The signs and symptoms of potential                            delayed complications were discussed with the                            patient. Return to normal activities tomorrow.                            Written discharge instructions were provided to the                            patient.                           - Resume previous diet.                           - Continue present medications.                           -  Await pathology results.                           - Repeat colonoscopy in 2 years for surveillance. Jerene Bears, MD 09/30/2017 8:45:24 AM This report has been signed electronically.

## 2017-10-01 ENCOUNTER — Telehealth: Payer: Self-pay

## 2017-10-01 LAB — HM COLONOSCOPY

## 2017-10-01 NOTE — Telephone Encounter (Signed)
  Follow up Call-  Call back number 09/30/2017 02/22/2015  Post procedure Call Back phone  # [phone]  Permission to leave phone message Yes Yes  Some recent data might be hidden     Patient questions:  Do you have a fever, pain , or abdominal swelling? No. Pain Score  0 *  Have you tolerated food without any problems? Yes.    Have you been able to return to your normal activities? Yes.    Do you have any questions about your discharge instructions: Diet   No. Medications  No. Follow up visit  No.  Do you have questions or concerns about your Care? No.  Actions: * If pain score is 4 or above: No action needed, pain <4.

## 2017-10-03 ENCOUNTER — Encounter: Payer: Self-pay | Admitting: Internal Medicine

## 2017-10-15 ENCOUNTER — Other Ambulatory Visit: Payer: Self-pay | Admitting: Internal Medicine

## 2017-10-29 ENCOUNTER — Other Ambulatory Visit: Payer: Self-pay | Admitting: Internal Medicine

## 2017-10-29 DIAGNOSIS — I10 Essential (primary) hypertension: Secondary | ICD-10-CM

## 2017-10-29 DIAGNOSIS — J4520 Mild intermittent asthma, uncomplicated: Secondary | ICD-10-CM

## 2018-01-21 ENCOUNTER — Other Ambulatory Visit: Payer: Self-pay | Admitting: Internal Medicine

## 2018-01-24 ENCOUNTER — Other Ambulatory Visit: Payer: Self-pay | Admitting: Internal Medicine

## 2018-01-24 DIAGNOSIS — J45901 Unspecified asthma with (acute) exacerbation: Secondary | ICD-10-CM

## 2018-01-24 DIAGNOSIS — J4520 Mild intermittent asthma, uncomplicated: Secondary | ICD-10-CM

## 2018-02-05 ENCOUNTER — Telehealth: Payer: Self-pay | Admitting: Internal Medicine

## 2018-02-05 DIAGNOSIS — I10 Essential (primary) hypertension: Secondary | ICD-10-CM

## 2018-02-05 NOTE — Telephone Encounter (Signed)
Patient states pharmacy told him to call office and schedule an appointment. Appointment scheduled for 02/23/18. States he will be out of town next week. Please advise. Would like to know if enough medication could be sent to the pharmacy to last until this appointment? [facility] [place], [address]

## 2018-02-06 ENCOUNTER — Ambulatory Visit: Payer: Medicare Other | Admitting: Family

## 2018-02-06 ENCOUNTER — Encounter: Payer: Self-pay | Admitting: Family

## 2018-02-06 VITALS — BP 126/76 | HR 79 | Temp 97.9°F | Ht 60.0 in | Wt 190.1 lb

## 2018-02-06 DIAGNOSIS — J45909 Unspecified asthma, uncomplicated: Secondary | ICD-10-CM | POA: Diagnosis not present

## 2018-02-06 MED ORDER — GUAIFENESIN-CODEINE 100-10 MG/5ML PO SOLN: 5.0000 mL | Freq: Four times a day (QID) | ORAL | 0 refills | Status: DC | PRN

## 2018-02-06 MED ORDER — LEVOFLOXACIN 500 MG PO TABS: 500.0000 mg | ORAL_TABLET | Freq: Every day | ORAL | 0 refills | Status: DC

## 2018-02-06 MED ORDER — TELMISARTAN 40 MG PO TABS: 40.0000 mg | ORAL_TABLET | Freq: Every day | ORAL | 0 refills | Status: DC

## 2018-02-06 NOTE — Telephone Encounter (Signed)
erx has been sent as requested.  

## 2018-02-06 NOTE — Addendum Note (Signed)
Addended by: Aviva Signs M on: 02/06/2018 10:17 AM   Modules accepted: Orders

## 2018-02-06 NOTE — Progress Notes (Signed)
Darryl [name] is a 67 y.o. male with the following history as recorded in EpicCare:  Patient Active Problem List   Diagnosis Date Noted  . Hyperlipidemia with target LDL less than 130 07/04/2015  . BPH associated with nocturia 07/04/2015  . Fear of flying 07/04/2015  . Essential hypertension, benign 12/01/2013  . Routine general medical examination at a health care facility 05/19/2013  . Screening, heart disease, ischemic 05/19/2013  . CROHN'S DISEASE, LARGE INTESTINE 04/27/2009  . Prediabetes 03/09/2008  . Vitamin B12 deficiency anemia 02/08/2008  . GERD 02/08/2008  . OBESITY, NOS 05/22/2006  . Asthma in adult, mild intermittent, uncomplicated 16/12/9602    Current Outpatient Medications  Medication Sig Dispense Refill  . Cholecalciferol (VITAMIN D3) 1000 units CAPS Take 1 capsule by mouth daily.    . fluticasone-salmeterol (ADVAIR HFA) 115-21 MCG/ACT inhaler USE 2 PUFFS TWICE DAILY AS NEEDED. SHAKE WELL 12 g 1  . folic acid (FOLVITE) 1 MG tablet TAKE 1 TABLET ONCE DAILY. 90 tablet 1  . hydrocortisone 2.5 % cream     . loperamide (IMODIUM A-D) 2 MG tablet Take 2 mg by mouth 4 (four) times daily as needed for diarrhea or loose stools.    . mercaptopurine (PURINETHOL) 50 MG tablet TAKE 1&1/2 TABLETS EVERY OTHER DAY ALTERNATING WITH 2 TABS EVERY OTHER DAY 75 tablet 3  . montelukast (SINGULAIR) 10 MG tablet TAKE ONE TABLET AT BEDTIME. 90 tablet 0  . Multiple Vitamins-Minerals (CENTRUM SILVER PO) Take 1 tablet by mouth daily.    . Omega-3 Fatty Acids (DIALYVITE OMEGA-3 CONCENTRATE) 600 MG CAPS Take 1 tablet by mouth 2 (two) times daily.    . pantoprazole (PROTONIX) 40 MG tablet TAKE 1 TABLET ONCE DAILY. 90 tablet 1  . Pseudoephedrine-Ibuprofen 30-200 MG TABS Take by mouth daily as needed.    Marland Kitchen telmisartan (MICARDIS) 40 MG tablet Take 1 tablet (40 mg total) by mouth daily. 30 tablet 0  . VENTOLIN HFA 108 (90 Base) MCG/ACT inhaler USE 2 PUFFS EVERY 6 HOURS AS NEEDED FOR WHEEZING. **SHAKE  WELL** 18 g 0  . vitamin B-12 (CYANOCOBALAMIN) 1000 MCG tablet Take 1 tablet (1,000 mcg total) by mouth daily.    Marland Kitchen guaiFENesin-codeine 100-10 MG/5ML syrup Take 5 mLs by mouth every 6 (six) hours as needed for cough. 100 mL 0  . levofloxacin (LEVAQUIN) 500 MG tablet Take 1 tablet (500 mg total) by mouth daily. 7 tablet 0   Current Facility-Administered Medications  Medication Dose Route Frequency Provider Last Rate Last Dose  . 0.9 %  sodium chloride infusion  500 mL Intravenous Once Pyrtle, Lajuan Lines, MD        Allergies: Asacol [mesalamine]; Amoxicillin; and Sulfamethoxazole  Past Medical History:  Diagnosis Date  . Acute upper respiratory infections of unspecified site   . Allergic rhinitis, cause unspecified   . Asthma   . Cataract   . Crohn's colitis ([place])   . Dizziness and giddiness   . Exostosis of unspecified site   . GERD (gastroesophageal reflux disease)   . Hyperplastic colon polyp 02/26/2008  . Hypertension   . Lateral epicondylitis  of elbow   . Nontraumatic rupture of patellar tendon   . Obesity, unspecified   . Regional enteritis of unspecified site   . Seasonal allergies   . Vitamin B12 deficiency     Past Surgical History:  Procedure Laterality Date  . COLONOSCOPY     2016  . TONSILLECTOMY AND ADENOIDECTOMY      Family History  Problem Relation Age of Onset  . Diabetes Brother   . Diabetes Father   . Heart attack Father   . Hypertension Father   . Heart disease Father   . Colon cancer Neg Hx   . Stomach cancer Neg Hx   . Hyperlipidemia Neg Hx   . Sudden death Neg Hx   . Cancer Neg Hx   . COPD Neg Hx   . Alcohol abuse Neg Hx   . Drug abuse Neg Hx   . Stroke Neg Hx   . Rectal cancer Neg Hx   . Esophageal cancer Neg Hx     Social History   Tobacco Use  . Smoking status: Never Smoker  . Smokeless tobacco: Never Used  Substance Use Topics  . Alcohol use: Yes    Alcohol/week: 0.0 standard drinks    Comment: rare    Subjective:  Patient presents  with concerns for possible bronchitis; symptoms x 1 week; having to use rescue inhaler more frequently; feels congestion moving into chest; no chest pain; prone to asthmatic bronchitis; normally gets Levaquin for these type of infections and does well; not sleeping well at night- Robitussin not beneficial; also does not do well with oral steroids;     Objective:  Vitals:   02/06/18 1410  BP: 126/76  Pulse: 79  Temp: 97.9 F (36.6 C)  TempSrc: Oral  SpO2: 96%  Weight: 190 lb 1.9 oz (86.2 kg)  Height: 5' (1.524 m)    General: Well developed, well nourished, in no acute distress  Skin : Warm and dry.  Head: Normocephalic and atraumatic  Eyes: Sclera and conjunctiva clear; pupils round and reactive to light; extraocular movements intact  Ears: External normal; canals clear; tympanic membranes normal  Oropharynx: Pink, supple. No suspicious lesions  Neck: Supple without thyromegaly, adenopathy  Lungs: Respirations unlabored; clear to auscultation bilaterally without wheeze, rales, rhonchi  CVS exam: normal rate and regular rhythm.  Neurologic: Alert and oriented; speech intact; face symmetrical; moves all extremities well; CNII-XII intact without focal deficit   Assessment:  1. Acute asthmatic bronchitis     Plan:  Rx for Levaquin 500 mg qd x 7 days; continue Advair as prescribed; Rx for Robitussin AC to use at night; increase fluids, rest and follow up worse, no better.   No follow-ups on file.  No orders of the defined types were placed in this encounter.   Requested Prescriptions   Signed Prescriptions Disp Refills  . levofloxacin (LEVAQUIN) 500 MG tablet 7 tablet 0    Sig: Take 1 tablet (500 mg total) by mouth daily.  Marland Kitchen guaiFENesin-codeine 100-10 MG/5ML syrup 100 mL 0    Sig: Take 5 mLs by mouth every 6 (six) hours as needed for cough.

## 2018-02-23 ENCOUNTER — Other Ambulatory Visit (INDEPENDENT_AMBULATORY_CARE_PROVIDER_SITE_OTHER): Payer: Medicare Other

## 2018-02-23 ENCOUNTER — Encounter: Payer: Self-pay | Admitting: Internal Medicine

## 2018-02-23 ENCOUNTER — Ambulatory Visit: Payer: Medicare Other | Admitting: Internal Medicine

## 2018-02-23 VITALS — BP 132/84 | HR 67 | Temp 97.5°F | Resp 16 | Ht 60.0 in | Wt 192.0 lb

## 2018-02-23 DIAGNOSIS — R7303 Prediabetes: Secondary | ICD-10-CM

## 2018-02-23 DIAGNOSIS — N401 Enlarged prostate with lower urinary tract symptoms: Secondary | ICD-10-CM

## 2018-02-23 DIAGNOSIS — R351 Nocturia: Secondary | ICD-10-CM

## 2018-02-23 DIAGNOSIS — I10 Essential (primary) hypertension: Secondary | ICD-10-CM

## 2018-02-23 DIAGNOSIS — E785 Hyperlipidemia, unspecified: Secondary | ICD-10-CM | POA: Diagnosis not present

## 2018-02-23 DIAGNOSIS — D510 Vitamin B12 deficiency anemia due to intrinsic factor deficiency: Secondary | ICD-10-CM

## 2018-02-23 DIAGNOSIS — Z0000 Encounter for general adult medical examination without abnormal findings: Secondary | ICD-10-CM | POA: Diagnosis not present

## 2018-02-23 DIAGNOSIS — Z23 Encounter for immunization: Secondary | ICD-10-CM | POA: Diagnosis not present

## 2018-02-23 DIAGNOSIS — J45901 Unspecified asthma with (acute) exacerbation: Secondary | ICD-10-CM

## 2018-02-23 DIAGNOSIS — N400 Enlarged prostate without lower urinary tract symptoms: Secondary | ICD-10-CM

## 2018-02-23 LAB — CBC WITH DIFFERENTIAL/PLATELET
Basophils Absolute: 0 10*3/uL (ref 0.0–0.1)
Basophils Relative: 0.3 % (ref 0.0–3.0)
EOS ABS: 0 10*3/uL (ref 0.0–0.7)
Eosinophils Relative: 0.3 % (ref 0.0–5.0)
HCT: 41.3 % (ref 39.0–52.0)
Hemoglobin: 14.4 g/dL (ref 13.0–17.0)
LYMPHS PCT: 25 % (ref 12.0–46.0)
Lymphs Abs: 1.4 10*3/uL (ref 0.7–4.0)
MCHC: 34.8 g/dL (ref 30.0–36.0)
MCV: 107 fl — ABNORMAL HIGH (ref 78.0–100.0)
Monocytes Absolute: 0.5 10*3/uL (ref 0.1–1.0)
Monocytes Relative: 8.3 % (ref 3.0–12.0)
Neutro Abs: 3.6 10*3/uL (ref 1.4–7.7)
Neutrophils Relative %: 66.1 % (ref 43.0–77.0)
Platelets: 262 10*3/uL (ref 150.0–400.0)
RBC: 3.86 Mil/uL — ABNORMAL LOW (ref 4.22–5.81)
RDW: 14.7 % (ref 11.5–15.5)
WBC: 5.5 10*3/uL (ref 4.0–10.5)

## 2018-02-23 LAB — COMPREHENSIVE METABOLIC PANEL
ALBUMIN: 4.2 g/dL (ref 3.5–5.2)
ALT: 61 U/L — ABNORMAL HIGH (ref 0–53)
AST: 33 U/L (ref 0–37)
Alkaline Phosphatase: 69 U/L (ref 39–117)
BUN: 17 mg/dL (ref 6–23)
CO2: 25 mEq/L (ref 19–32)
Calcium: 9.2 mg/dL (ref 8.4–10.5)
Chloride: 105 mEq/L (ref 96–112)
Creatinine, Ser: 1.09 mg/dL (ref 0.40–1.50)
GFR: 71.38 mL/min (ref >60.00)
Glucose, Bld: 114 mg/dL — ABNORMAL HIGH (ref 70–99)
POTASSIUM: 4.1 meq/L (ref 3.5–5.1)
Sodium: 139 mEq/L (ref 135–145)
Total Bilirubin: 0.8 mg/dL (ref 0.2–1.2)
Total Protein: 6.5 g/dL (ref 6.0–8.3)

## 2018-02-23 LAB — LIPID PANEL
Cholesterol: 222 mg/dL — ABNORMAL HIGH (ref 0–200)
HDL: 67.2 mg/dL (ref >39.00)
LDL Cholesterol: 137 mg/dL — ABNORMAL HIGH (ref 0–99)
NonHDL: 155.12
Total CHOL/HDL Ratio: 3
Triglycerides: 90 mg/dL (ref 0.0–149.0)
VLDL: 18 mg/dL (ref 0.0–40.0)

## 2018-02-23 LAB — HEMOGLOBIN A1C: Hgb A1c MFr Bld: 6 % (ref 4.6–6.5)

## 2018-02-23 LAB — PSA: PSA: 0.38 ng/mL (ref 0.10–4.00)

## 2018-02-23 LAB — VITAMIN B12: VITAMIN B 12: 945 pg/mL — AB (ref 211–911)

## 2018-02-23 LAB — FOLATE

## 2018-02-23 LAB — TSH: TSH: 0.79 u[IU]/mL (ref 0.35–4.50)

## 2018-02-23 MED ORDER — ROSUVASTATIN CALCIUM 10 MG PO TABS: 10.0000 mg | ORAL_TABLET | Freq: Every day | ORAL | 1 refills | Status: DC

## 2018-02-23 NOTE — Patient Instructions (Signed)

## 2018-02-23 NOTE — Progress Notes (Signed)
Subjective:  Patient ID: Darryl [name], male    DOB: 04/03/1950  Age: 67 y.o. MRN: [id number]  CC: Annual Exam; Hypertension; and Hyperlipidemia   HPI [name] presents for a CPX.  He tells me his blood pressure has been well controlled.  He has felt well recently and is very active.  He denies DOE, CP, palpitations, edema, or fatigue.  Past Medical History:  Diagnosis Date  . Acute upper respiratory infections of unspecified site   . Allergic rhinitis, cause unspecified   . Asthma   . Cataract   . Crohn's colitis ([place])   . Dizziness and giddiness   . Exostosis of unspecified site   . GERD (gastroesophageal reflux disease)   . Hyperplastic colon polyp 02/26/2008  . Hypertension   . Lateral epicondylitis  of elbow   . Nontraumatic rupture of patellar tendon   . Obesity, unspecified   . Regional enteritis of unspecified site   . Seasonal allergies   . Vitamin B12 deficiency    Past Surgical History:  Procedure Laterality Date  . COLONOSCOPY     2016  . TONSILLECTOMY AND ADENOIDECTOMY      reports that he has never smoked. He has never used smokeless tobacco. He reports that he drinks alcohol. He reports that he does not use drugs. family history includes Diabetes in his brother and father; Heart attack in his father; Heart disease in his father; Hypertension in his father. Allergies  Allergen Reactions  . Asacol [Mesalamine] Hives    Trouble swallowing, loss of appetite  . Amoxicillin Rash  . Sulfamethoxazole Rash    Outpatient Medications Prior to Visit  Medication Sig Dispense Refill  . Cholecalciferol (VITAMIN D3) 1000 units CAPS Take 1 capsule by mouth daily.    . fluticasone-salmeterol (ADVAIR HFA) 115-21 MCG/ACT inhaler USE 2 PUFFS TWICE DAILY AS NEEDED. SHAKE WELL 12 g 1  . folic acid (FOLVITE) 1 MG tablet TAKE 1 TABLET ONCE DAILY. 90 tablet 1  . hydrocortisone 2.5 % cream     . loperamide (IMODIUM A-D) 2 MG tablet Take 2 mg by mouth 4 (four) times  daily as needed for diarrhea or loose stools.    . mercaptopurine (PURINETHOL) 50 MG tablet TAKE 1&1/2 TABLETS EVERY OTHER DAY ALTERNATING WITH 2 TABS EVERY OTHER DAY 75 tablet 3  . montelukast (SINGULAIR) 10 MG tablet TAKE ONE TABLET AT BEDTIME. 90 tablet 0  . Multiple Vitamins-Minerals (CENTRUM SILVER PO) Take 1 tablet by mouth daily.    . Omega-3 Fatty Acids (DIALYVITE OMEGA-3 CONCENTRATE) 600 MG CAPS Take 1 tablet by mouth 2 (two) times daily.    . pantoprazole (PROTONIX) 40 MG tablet TAKE 1 TABLET ONCE DAILY. 90 tablet 1  . telmisartan (MICARDIS) 40 MG tablet Take 1 tablet (40 mg total) by mouth daily. 30 tablet 0  . vitamin B-12 (CYANOCOBALAMIN) 1000 MCG tablet Take 1 tablet (1,000 mcg total) by mouth daily.    Marland Kitchen guaiFENesin-codeine 100-10 MG/5ML syrup Take 5 mLs by mouth every 6 (six) hours as needed for cough. 100 mL 0  . levofloxacin (LEVAQUIN) 500 MG tablet Take 1 tablet (500 mg total) by mouth daily. 7 tablet 0  . Pseudoephedrine-Ibuprofen 30-200 MG TABS Take by mouth daily as needed.    . VENTOLIN HFA 108 (90 Base) MCG/ACT inhaler USE 2 PUFFS EVERY 6 HOURS AS NEEDED FOR WHEEZING. **SHAKE WELL** 18 g 0  . 0.9 %  sodium chloride infusion      No facility-administered medications  prior to visit.     ROS Review of Systems  Constitutional: Negative.  Negative for appetite change, diaphoresis and fatigue.  HENT: Negative.   Eyes: Negative for visual disturbance.  Respiratory: Negative for cough, chest tightness, shortness of breath and wheezing.   Cardiovascular: Negative for chest pain, palpitations and leg swelling.  Gastrointestinal: Negative for abdominal pain, constipation, diarrhea and nausea.  Endocrine: Negative.   Genitourinary: Negative.  Negative for difficulty urinating, dysuria, penile swelling, scrotal swelling, testicular pain and urgency.  Musculoskeletal: Negative.  Negative for arthralgias and myalgias.  Skin: Negative.  Negative for color change.  Neurological:  Negative.  Negative for dizziness, weakness and light-headedness.  Hematological: Negative for adenopathy. Does not bruise/bleed easily.  Psychiatric/Behavioral: Negative.     Objective:  BP 132/84 (BP Location: Left Arm, Patient Position: Sitting, Cuff Size: Large)   Pulse 67   Temp (!) 97.5 F (36.4 C) (Oral)   Resp 16   Ht 5' (1.524 m)   Wt 192 lb (87.1 kg)   SpO2 97%   BMI 37.50 kg/m   BP Readings from Last 3 Encounters:  02/23/18 132/84  02/06/18 126/76  09/30/17 111/79    Wt Readings from Last 3 Encounters:  02/23/18 192 lb (87.1 kg)  02/06/18 190 lb 1.9 oz (86.2 kg)  09/30/17 190 lb (86.2 kg)    Physical Exam  Constitutional: He is oriented to person, place, and time. No distress.  HENT:  Mouth/Throat: Oropharynx is clear and moist. No oropharyngeal exudate.  Eyes: Conjunctivae are normal. No scleral icterus.  Neck: Normal range of motion. Neck supple. No JVD present. No thyromegaly present.  Cardiovascular: Normal rate, regular rhythm and normal heart sounds. Exam reveals no gallop.  No murmur heard. Pulmonary/Chest: Effort normal and breath sounds normal. He has no wheezes. He has no rhonchi. He has no rales.  Abdominal: Soft. Bowel sounds are normal. He exhibits no mass. There is no hepatosplenomegaly. There is no tenderness. Hernia confirmed negative in the right inguinal area and confirmed negative in the left inguinal area.  Genitourinary: Testes normal and penis normal. Rectal exam shows internal hemorrhoid. Rectal exam shows no external hemorrhoid, no fissure, no mass, no tenderness, anal tone normal and guaiac negative stool. Prostate is enlarged (1+ smooth symm BPH). Prostate is not tender. Right testis shows no mass, no swelling and no tenderness. Left testis shows no mass, no swelling and no tenderness. Circumcised. No penile erythema or penile tenderness. No discharge found.  Musculoskeletal: Normal range of motion. He exhibits no edema, tenderness or  deformity.  Lymphadenopathy:    He has no cervical adenopathy. No inguinal adenopathy noted on the right or left side.  Neurological: He is alert and oriented to person, place, and time.  Skin: Skin is warm and dry. No rash noted. He is not diaphoretic.  Vitals reviewed.   Lab Results  Component Value Date   WBC 5.5 02/23/2018   HGB 14.4 02/23/2018   HCT 41.3 02/23/2018   PLT 262.0 02/23/2018   GLUCOSE 114 (H) 02/23/2018   CHOL 222 (H) 02/23/2018   TRIG 90.0 02/23/2018   HDL 67.20 02/23/2018   LDLCALC 137 (H) 02/23/2018   ALT 61 (H) 02/23/2018   AST 33 02/23/2018   NA 139 02/23/2018   K 4.1 02/23/2018   CL 105 02/23/2018   CREATININE 1.09 02/23/2018   BUN 17 02/23/2018   CO2 25 02/23/2018   TSH 0.79 02/23/2018   PSA 0.38 02/23/2018   HGBA1C 6.0 02/23/2018  Dg Chest 2 View  Result Date: 09/30/2013 CLINICAL DATA:  Congestion and cough for 11 days, nonsmoker, history asthma EXAM: CHEST  2 VIEW COMPARISON:  None FINDINGS: Enlargement of cardiac silhouette. Mediastinal contours and pulmonary vascularity normal. Lungs grossly clear without infiltrate or effusion. No pneumothorax. Bones demineralized. IMPRESSION: Enlargement of cardiac silhouette. No acute infiltrate. Electronically Signed   By: Lavonia Dana M.D.   On: 09/30/2013 16:12    Assessment & Plan:   Essie was seen today for annual exam, hypertension and hyperlipidemia.  Diagnoses and all orders for this visit:  Essential hypertension, benign- His blood pressure is well controlled.  Electrolytes and renal function are normal.  Will continue the ARB at the current dose. -     Comprehensive metabolic panel; Future -     Urinalysis, Routine w reflex microscopic; Future  BPH associated with nocturia- His PSA is low which is reassuring that he does not have prostate cancer.  He has no symptoms that need to be treated. -     PSA; Future  Hyperlipidemia with target LDL less than 130- His ASCVD risk score is up to 18% so I  have asked him to start taking a statin for CV risk reduction. -     Lipid panel; Future -     TSH; Future -     rosuvastatin (CRESTOR) 10 MG tablet; Take 1 tablet (10 mg total) by mouth daily.  Prediabetes- His A1c is at 6.0%.  Medical therapy is not indicated.  He was encouraged to improve his lifestyle modifications. -     Comprehensive metabolic panel; Future -     Hemoglobin A1c; Future  Vitamin B12 deficiency anemia due to intrinsic factor deficiency- He is maintaining a normal B12 level on high-dose oral supplementation.  Will continue the current dose. -     CBC with Differential/Platelet; Future -     Vitamin B12; Future -     Folate; Future  Need for influenza vaccination -     Flu vaccine HIGH DOSE PF (Fluzone High dose)  Routine general medical examination at a health care facility   I have discontinued Corvin W. Furgason's Pseudoephedrine-Ibuprofen, VENTOLIN HFA, levofloxacin, and guaiFENesin-codeine. I am also having him start on rosuvastatin. Additionally, I am having him maintain his vitamin P-18, folic acid, pantoprazole, hydrocortisone, DIALYVITE OMEGA-3 CONCENTRATE, Vitamin D3, Multiple Vitamins-Minerals (CENTRUM SILVER PO), loperamide, mercaptopurine, montelukast, fluticasone-salmeterol, and telmisartan. We will stop administering sodium chloride.  Meds ordered this encounter  Medications  . rosuvastatin (CRESTOR) 10 MG tablet    Sig: Take 1 tablet (10 mg total) by mouth daily.    Dispense:  90 tablet    Refill:  1   See AVS for instructions about healthy living and anticipatory guidance.  Follow-up: Return in about 6 months (around 08/25/2018).  Scarlette Calico, MD

## 2018-02-24 NOTE — Assessment & Plan Note (Signed)

## 2018-03-05 ENCOUNTER — Other Ambulatory Visit: Payer: Self-pay | Admitting: Internal Medicine

## 2018-03-10 ENCOUNTER — Other Ambulatory Visit: Payer: Self-pay | Admitting: Internal Medicine

## 2018-03-10 DIAGNOSIS — I10 Essential (primary) hypertension: Secondary | ICD-10-CM

## 2018-03-23 ENCOUNTER — Other Ambulatory Visit: Payer: Self-pay | Admitting: Internal Medicine

## 2018-03-23 DIAGNOSIS — K5090 Crohn's disease, unspecified, without complications: Secondary | ICD-10-CM

## 2018-03-23 NOTE — Telephone Encounter (Signed)
Dr Hilarie Fredrickson, ok to refill mercaptopurine at current dose? Please see CMP from 02/23/18

## 2018-03-26 NOTE — Telephone Encounter (Signed)
I have spoken to patient to ask that he come for labwork this week or early next week to make sure he is continuing to tolerate his mercaptopurine okay (his LFT's were elevated 02/23/18). He states he will come tomorrow. Will give 1 month refill until we see labs.

## 2018-03-26 NOTE — Telephone Encounter (Signed)
Refill 42m for now - same dose But have the patient come for repeat hepatic function panel and thiopurine metabolite panel

## 2018-03-27 ENCOUNTER — Other Ambulatory Visit (INDEPENDENT_AMBULATORY_CARE_PROVIDER_SITE_OTHER): Payer: Medicare Other

## 2018-03-27 DIAGNOSIS — K5090 Crohn's disease, unspecified, without complications: Secondary | ICD-10-CM | POA: Diagnosis not present

## 2018-03-27 LAB — HEPATIC FUNCTION PANEL
ALT: 54 U/L — ABNORMAL HIGH (ref 0–53)
AST: 33 U/L (ref 0–37)
Albumin: 3.9 g/dL (ref 3.5–5.2)
Alkaline Phosphatase: 66 U/L (ref 39–117)
BILIRUBIN DIRECT: 0.1 mg/dL (ref 0.0–0.3)
Total Bilirubin: 0.7 mg/dL (ref 0.2–1.2)
Total Protein: 6.3 g/dL (ref 6.0–8.3)

## 2018-03-31 ENCOUNTER — Other Ambulatory Visit: Payer: Self-pay

## 2018-03-31 DIAGNOSIS — K5090 Crohn's disease, unspecified, without complications: Secondary | ICD-10-CM

## 2018-03-31 LAB — THIOPURINE METABOLITES

## 2018-04-02 ENCOUNTER — Other Ambulatory Visit: Payer: Medicare Other

## 2018-04-02 DIAGNOSIS — K5090 Crohn's disease, unspecified, without complications: Secondary | ICD-10-CM

## 2018-04-09 ENCOUNTER — Other Ambulatory Visit: Payer: Self-pay | Admitting: Internal Medicine

## 2018-04-09 DIAGNOSIS — J4520 Mild intermittent asthma, uncomplicated: Secondary | ICD-10-CM

## 2018-04-10 ENCOUNTER — Other Ambulatory Visit: Payer: Self-pay

## 2018-04-10 DIAGNOSIS — K5090 Crohn's disease, unspecified, without complications: Secondary | ICD-10-CM

## 2018-04-10 LAB — THIOPURINE METABOLITES
6 MMP(6-Methylmercaptopurine): 5527 pmol/8x10(8)RBC (ref <5700)
6 TG(6-THIOGUANINE): 166 pmol/8x10(8)RBC — AB (ref 235–400)

## 2018-04-13 ENCOUNTER — Other Ambulatory Visit: Payer: Self-pay | Admitting: Internal Medicine

## 2018-04-27 ENCOUNTER — Telehealth: Payer: Self-pay | Admitting: Internal Medicine

## 2018-04-27 NOTE — Telephone Encounter (Signed)
Patient indicates that he is going out of town next week and is very nervous because he began having 3-4 episodes of diarrhea this morning. No blood noted and no abdominal pain. Patient feels this may be the "beginning of a crohn's flare" and I dont want to be out of town when it is getting bad.  Dr Hilarie Fredrickson, would you continue to hold off on any changes at this early stage of symptoms or would you change anything in lite of the fact that he will be leaving town next week?

## 2018-04-27 NOTE — Telephone Encounter (Signed)
PT advised that would like to speak to someone about his med mercaptopurine (PURINETHOL). JG

## 2018-04-28 ENCOUNTER — Other Ambulatory Visit: Payer: Self-pay | Admitting: Internal Medicine

## 2018-04-28 MED ORDER — BUDESONIDE ER 9 MG PO TB24: 1.0000 | ORAL_TABLET | Freq: Every day | ORAL | 1 refills | Status: DC

## 2018-04-28 NOTE — Telephone Encounter (Signed)
I have spoken to patient to advise of Dr Vena Rua response and recommendations. Advised that he should begin Uceris 9 mg daily x 8 weeks first and I have scheduled him to see Dr Hilarie Fredrickson for follow up on 06/02/2018 to discuss further treatment. Patient verbalizes understanding.

## 2018-04-28 NOTE — Telephone Encounter (Signed)
Message received It should noted that I recently reduced his 6 mp dose because of mild hepatotoxicity and borderline level of hepatotoxic metabolite.   This active (good) metabolite (the one providing benefit against IBD) is low and thus I am not surprised that he may be having mild symptoms.  For now, I would recommend Uceris 9 mg daily x 8 days -- non-absorbed steroid that should not given him prednisone-like side effects.  Once he returns from his trip, I would recommend that he consider the addition of the medication allopurinol.  Allopurinol will "force" the metabolism of 6 mp to the "good" metabolite and away from the "bad" metabolite.  If he wishes, I am happy to have him come in and I will discuss with him in person. He should be made aware with any med additions, such as allopurinol, we will have to do regular lab monitoring initially until dose is stable.  Fortunately allopurinol is usually very well tolerated

## 2018-04-29 ENCOUNTER — Telehealth: Payer: Self-pay | Admitting: Internal Medicine

## 2018-04-29 NOTE — Telephone Encounter (Signed)
[employer] calling regarding Uceris, they faxed PA form yesterday, pt is traveling out of town tomorrow and is requesting is we could speed it up PA process.

## 2018-04-29 NOTE — Telephone Encounter (Signed)
Outcome  Approvedtoday Request Reference Number: [id number]. UCERIS TAB 9MG is approved through 03/25/2019. For further questions, call [phone].   Drug Uceris 9MG er tablets Form [employer] Electronic Prior Authorization Form [phone] NCPDP)

## 2018-04-29 NOTE — Telephone Encounter (Signed)
Prior Darryl [name] has been initiated through [email or web address] and we are awaiting response from insurance company.

## 2018-05-13 ENCOUNTER — Encounter: Payer: Self-pay | Admitting: *Deleted

## 2018-05-14 ENCOUNTER — Other Ambulatory Visit (INDEPENDENT_AMBULATORY_CARE_PROVIDER_SITE_OTHER): Payer: Medicare Other

## 2018-05-14 DIAGNOSIS — K5090 Crohn's disease, unspecified, without complications: Secondary | ICD-10-CM | POA: Diagnosis not present

## 2018-05-14 LAB — HEPATIC FUNCTION PANEL
ALT: 58 U/L — ABNORMAL HIGH (ref 0–53)
AST: 32 U/L (ref 0–37)
Albumin: 4.1 g/dL (ref 3.5–5.2)
Alkaline Phosphatase: 74 U/L (ref 39–117)
BILIRUBIN DIRECT: 0.2 mg/dL (ref 0.0–0.3)
Total Bilirubin: 0.8 mg/dL (ref 0.2–1.2)
Total Protein: 6.7 g/dL (ref 6.0–8.3)

## 2018-05-19 ENCOUNTER — Telehealth: Payer: Self-pay | Admitting: Internal Medicine

## 2018-05-19 ENCOUNTER — Other Ambulatory Visit: Payer: Self-pay | Admitting: Internal Medicine

## 2018-05-19 LAB — THIOPURINE METABOLITES

## 2018-05-19 MED ORDER — MERCAPTOPURINE 50 MG PO TABS: 75.0000 mg | ORAL_TABLET | Freq: Every day | ORAL | 0 refills | Status: DC

## 2018-05-19 MED ORDER — MERCAPTOPURINE 50 MG PO TABS: ORAL_TABLET | ORAL | 0 refills | Status: DC

## 2018-05-19 NOTE — Telephone Encounter (Signed)
I am still waiting to see the result of his thiopurine panel, this is pending His liver enzymes to this point are unchanged Not sure when this will result so in the meantime he may need to go ahead with his refill

## 2018-05-19 NOTE — Telephone Encounter (Signed)
Dr Hilarie Fredrickson, please advise.Marland KitchenMarland KitchenMarland Kitchen

## 2018-05-19 NOTE — Telephone Encounter (Signed)
Pt needs rf of mercapurine sent to [facility] but he wants to know first if dose needs to be changed based on the results of labs that he just had done.

## 2018-05-19 NOTE — Telephone Encounter (Signed)
Patient advised that thiopurine metabolite panel is not yet back and we are unsure as to how long this will take. He is also advised that liver enzymes remained unchanged and elevated. Therefore, we will continue same script for mercaptopurine at this time. He verbalizes understanding. Rx sent to pharmacy.

## 2018-05-19 NOTE — Addendum Note (Signed)
Addended by: Larina Bras on: 05/19/2018 02:52 PM   Modules accepted: Orders

## 2018-05-20 ENCOUNTER — Other Ambulatory Visit: Payer: Medicare Other

## 2018-05-20 DIAGNOSIS — K5090 Crohn's disease, unspecified, without complications: Secondary | ICD-10-CM

## 2018-05-21 ENCOUNTER — Other Ambulatory Visit: Payer: Medicare Other

## 2018-05-21 DIAGNOSIS — K5090 Crohn's disease, unspecified, without complications: Secondary | ICD-10-CM

## 2018-05-26 LAB — THIOPURINE METABOLITES

## 2018-05-27 ENCOUNTER — Other Ambulatory Visit: Payer: Medicare Other

## 2018-05-27 ENCOUNTER — Other Ambulatory Visit: Payer: Self-pay | Admitting: Internal Medicine

## 2018-05-27 DIAGNOSIS — K5090 Crohn's disease, unspecified, without complications: Secondary | ICD-10-CM

## 2018-05-27 NOTE — Addendum Note (Signed)
Addended by: Trenda Moots on: 10/24/4173 03:02 PM   Modules accepted: Orders

## 2018-06-02 ENCOUNTER — Ambulatory Visit: Payer: Medicare Other | Admitting: Internal Medicine

## 2018-06-02 ENCOUNTER — Encounter: Payer: Self-pay | Admitting: Internal Medicine

## 2018-06-02 ENCOUNTER — Other Ambulatory Visit (INDEPENDENT_AMBULATORY_CARE_PROVIDER_SITE_OTHER): Payer: Medicare Other

## 2018-06-02 VITALS — BP 120/80 | HR 70 | Ht 66.0 in | Wt 190.4 lb

## 2018-06-02 DIAGNOSIS — Z5181 Encounter for therapeutic drug level monitoring: Secondary | ICD-10-CM

## 2018-06-02 DIAGNOSIS — K5010 Crohn's disease of large intestine without complications: Secondary | ICD-10-CM | POA: Diagnosis not present

## 2018-06-02 DIAGNOSIS — Z8739 Personal history of other diseases of the musculoskeletal system and connective tissue: Secondary | ICD-10-CM

## 2018-06-02 DIAGNOSIS — R7401 Elevation of levels of liver transaminase levels: Secondary | ICD-10-CM

## 2018-06-02 DIAGNOSIS — K219 Gastro-esophageal reflux disease without esophagitis: Secondary | ICD-10-CM

## 2018-06-02 DIAGNOSIS — E538 Deficiency of other specified B group vitamins: Secondary | ICD-10-CM

## 2018-06-02 DIAGNOSIS — Z79899 Other long term (current) drug therapy: Secondary | ICD-10-CM

## 2018-06-02 DIAGNOSIS — R740 Nonspecific elevation of levels of transaminase and lactic acid dehydrogenase [LDH]: Secondary | ICD-10-CM

## 2018-06-02 LAB — URIC ACID: Uric Acid, Serum: 4.9 mg/dL (ref 4.0–7.8)

## 2018-06-02 MED ORDER — MERCAPTOPURINE 50 MG PO TABS: 50.0000 mg | ORAL_TABLET | Freq: Every day | ORAL | 0 refills | Status: DC

## 2018-06-02 MED ORDER — ALLOPURINOL 100 MG PO TABS: 100.0000 mg | ORAL_TABLET | Freq: Every day | ORAL | 2 refills | Status: DC

## 2018-06-02 NOTE — Patient Instructions (Signed)
Your provider has requested that you go to the basement level for lab work before leaving today. Press "B" on the elevator. The lab is located at the first door on the left as you exit the elevator.  Please decrease mercaptopurine to 50 mg daily.  Complete Uceris.  We have sent the following medications to your pharmacy for you to pick up at your convenience: Allopurinol 100 mg daily.  Your provider has requested that you go to the basement level for lab work 06/17/18. Press "B" on the elevator. The lab is located at the first door on the left as you exit the elevator.  If you are age 71 or older, your body mass index should be between 23-30. Your Body mass index is 30.73 kg/m. If this is out of the aforementioned range listed, please consider follow up with your Primary Care Provider.  If you are age 34 or younger, your body mass index should be between 19-25. Your Body mass index is 30.73 kg/m. If this is out of the aformentioned range listed, please consider follow up with your Primary Care Provider.

## 2018-06-03 ENCOUNTER — Other Ambulatory Visit: Payer: Self-pay

## 2018-06-03 ENCOUNTER — Encounter: Payer: Self-pay | Admitting: Internal Medicine

## 2018-06-03 DIAGNOSIS — K5090 Crohn's disease, unspecified, without complications: Secondary | ICD-10-CM

## 2018-06-03 NOTE — Progress Notes (Signed)
Subjective:    Patient ID: Darryl [name], male    DOB: 16-Aug-1950, 68 y.o.   MRN: [id number]  HPI Darryl [name] is a 68 year old male with a history of Crohn's colitis for greater than 2 decades managed on 6-MP, GERD who is here for follow-up.  He was last seen in the office on 08/07/2016 but for surveillance colonoscopy on 09/30/2017.  He is here alone today.  His last surveillance colonoscopy revealed normal-appearing terminal ileum.  There was mild anal stenosis.  There were 3 polyps removed from the cecum, ascending colon and rectum.  Largest being 8 mm.  There was no evidence of active colitis and surveillance biopsies were performed.  There was diverticulosis in the sigmoid colon.  Pathology showed sessile serrated polyp in the cecum and a sending colon.  The rectal polyp was hyperplastic.  Surveillance biopsies from both right and left colon showed benign colonic mucosa without significant inflammation or other abnormalities identified.  He reports that he had been feeling well but just before he traveled to [facility] he developed loose stools and abdominal crampy discomfort reminiscent of prior flares of his colitis.  He notified our office.  This was despite his 6-MP at 75 mg daily.  He was started on Uceris which she has now been on 9 mg x 5 weeks.  Symptoms resolved within a week of starting budesonide.  Currently he is feeling well.  No abdominal pain.  No diarrhea.  No blood in the stool.  No tenesmus.  No fevers or chills.  We have been watching his liver enzymes he has had a very slightly elevated ALT.  His thiopurine metabolite panel showed a sub-therapeutic 6 TG level and a 6 MMP level which was near 5700.   Review of Systems As per HPI, otherwise negative  Current Medications, Allergies, Past Medical History, Past Surgical History, Family History and Social History were reviewed in [employer] record.     Objective:   Physical Exam BP 120/80   Pulse 70    Ht 5' 6"  (1.676 m)   Wt 190 lb 6 oz (86.4 kg)   BMI 30.73 kg/m  Gen: awake, alert, NAD HEENT: anicteric, op clear CV: RRR, no mrg Pulm: CTA b/l Abd: soft, NT/ND, +BS throughout Ext: no c/c/e Neuro: nonfocal  CBC    Component Value Date/Time   WBC 5.5 02/23/2018 1200   RBC 3.86 (L) 02/23/2018 1200   HGB 14.4 02/23/2018 1200   HCT 41.3 02/23/2018 1200   PLT 262.0 02/23/2018 1200   MCV 107.0 (H) 02/23/2018 1200   MCHC 34.8 02/23/2018 1200   RDW 14.7 02/23/2018 1200   LYMPHSABS 1.4 02/23/2018 1200   MONOABS 0.5 02/23/2018 1200   EOSABS 0.0 02/23/2018 1200   BASOSABS 0.0 02/23/2018 1200   CMP     Component Value Date/Time   NA 139 02/23/2018 1200   K 4.1 02/23/2018 1200   CL 105 02/23/2018 1200   CO2 25 02/23/2018 1200   GLUCOSE 114 (H) 02/23/2018 1200   BUN 17 02/23/2018 1200   CREATININE 1.09 02/23/2018 1200   CALCIUM 9.2 02/23/2018 1200   PROT 6.7 05/14/2018 0807   ALBUMIN 4.1 05/14/2018 0807   AST 32 05/14/2018 0807   ALT 58 (H) 05/14/2018 0807   ALKPHOS 74 05/14/2018 0807   BILITOT 0.8 05/14/2018 0807   GFRNONAA 72.63 04/27/2009 0848   GFRAA 99 02/12/2008 0935       Assessment & Plan:  68 year old male with  a history of Crohn's colitis for greater than 2 decades managed on 6-MP, GERD who is here for follow-up.  1.  Crohn's colitis, longstanding --recent flare responding very well to Uceris therapy.  We will have him complete 8 weeks of Uceris and then stop.  We had a long discussion today regarding his 6-MP and thiopurine metabolites.  He is subtherapeutic on the active metabolite which provides benefit in IBD and this very well may be the cause of his recent flare.  His 6 MMP metabolite is also elevated likely contributing to the mild elevation in ALT.  I have recommended that we begin allopurinol therapy to shunt his 6-MP metabolites towards 6-TG and away from 6 MMP.  We discussed this at length today.  He does have history of gout but not actively.  We will  check a uric acid level today.  Assuming normal begin allopurinol 100 mg daily.  Reduce 6-MP to 50 mg daily while we begin allopurinol.  He understands he will need more frequent labs until we establish his new dose.  Check CBC and hepatic function panel in 2 weeks.  Surveillance colonoscopy will be due in July 2021  2.  History of sessile serrated polyps --surveillance interval will be 2 years based on his longstanding Crohn's colitis  3.  B12 deficiency --continue oral B12 supplementation.  Previous B12 levels have been good.  4.  GERD --- well-controlled without alarm symptom on pantoprazole.  He will continue 40 mg daily.  6 to 110-monthfollow-up based on clinical course.

## 2018-06-06 LAB — THIOPURINE METABOLITES
6-MMPN Metaboilte: 1340 pmol/8x 10E8
6-TGN Metabolite: 110 pmol/8x 10E8

## 2018-06-18 ENCOUNTER — Other Ambulatory Visit (INDEPENDENT_AMBULATORY_CARE_PROVIDER_SITE_OTHER): Payer: Medicare Other

## 2018-06-18 DIAGNOSIS — K5090 Crohn's disease, unspecified, without complications: Secondary | ICD-10-CM

## 2018-06-18 LAB — COMPREHENSIVE METABOLIC PANEL
ALT: 35 U/L (ref 0–53)
AST: 25 U/L (ref 0–37)
Albumin: 4.2 g/dL (ref 3.5–5.2)
Alkaline Phosphatase: 79 U/L (ref 39–117)
BUN: 21 mg/dL (ref 6–23)
CHLORIDE: 105 meq/L (ref 96–112)
CO2: 23 mEq/L (ref 19–32)
Calcium: 9.3 mg/dL (ref 8.4–10.5)
Creatinine, Ser: 1.24 mg/dL (ref 0.40–1.50)
GFR: 57.82 mL/min — ABNORMAL LOW (ref >60.00)
Glucose, Bld: 136 mg/dL — ABNORMAL HIGH (ref 70–99)
Potassium: 4.2 mEq/L (ref 3.5–5.1)
Sodium: 137 mEq/L (ref 135–145)
Total Bilirubin: 0.8 mg/dL (ref 0.2–1.2)
Total Protein: 6.6 g/dL (ref 6.0–8.3)

## 2018-06-18 LAB — CBC WITH DIFFERENTIAL/PLATELET
Basophils Absolute: 0 10*3/uL (ref 0.0–0.1)
Basophils Relative: 0.3 % (ref 0.0–3.0)
EOS PCT: 0.1 % (ref 0.0–5.0)
Eosinophils Absolute: 0 10*3/uL (ref 0.0–0.7)
HCT: 40.5 % (ref 39.0–52.0)
Hemoglobin: 14.5 g/dL (ref 13.0–17.0)
Lymphocytes Relative: 19.2 % (ref 12.0–46.0)
Lymphs Abs: 1 10*3/uL (ref 0.7–4.0)
MCHC: 35.7 g/dL (ref 30.0–36.0)
MCV: 105.9 fl — ABNORMAL HIGH (ref 78.0–100.0)
Monocytes Absolute: 0.2 10*3/uL (ref 0.1–1.0)
Monocytes Relative: 3.5 % (ref 3.0–12.0)
NEUTROS PCT: 76.9 % (ref 43.0–77.0)
Neutro Abs: 4.1 10*3/uL (ref 1.4–7.7)
Platelets: 173 10*3/uL (ref 150.0–400.0)
RBC: 3.83 Mil/uL — ABNORMAL LOW (ref 4.22–5.81)
RDW: 13.9 % (ref 11.5–15.5)
WBC: 5.3 10*3/uL (ref 4.0–10.5)

## 2018-06-19 ENCOUNTER — Other Ambulatory Visit: Payer: Self-pay

## 2018-06-19 ENCOUNTER — Other Ambulatory Visit: Payer: Self-pay | Admitting: Internal Medicine

## 2018-06-19 DIAGNOSIS — K5090 Crohn's disease, unspecified, without complications: Secondary | ICD-10-CM

## 2018-06-24 DIAGNOSIS — I252 Old myocardial infarction: Secondary | ICD-10-CM

## 2018-06-24 DIAGNOSIS — I2510 Atherosclerotic heart disease of native coronary artery without angina pectoris: Secondary | ICD-10-CM

## 2018-06-24 DIAGNOSIS — I219 Acute myocardial infarction, unspecified: Secondary | ICD-10-CM

## 2018-06-24 HISTORY — DX: Acute myocardial infarction, unspecified: I21.9

## 2018-06-24 HISTORY — DX: Old myocardial infarction: I25.2

## 2018-06-24 HISTORY — DX: Atherosclerotic heart disease of native coronary artery without angina pectoris: I25.10

## 2018-06-25 ENCOUNTER — Other Ambulatory Visit: Payer: Self-pay | Admitting: Internal Medicine

## 2018-07-23 ENCOUNTER — Encounter (HOSPITAL_COMMUNITY): Payer: Self-pay | Admitting: Cardiology

## 2018-07-23 ENCOUNTER — Encounter (HOSPITAL_COMMUNITY)
Admission: EM | Disposition: A | Discharge status: Home / Self Care | Payer: Self-pay | Source: Home / Self Care | Attending: Cardiology

## 2018-07-23 ENCOUNTER — Inpatient Hospital Stay (HOSPITAL_COMMUNITY)
Admission: EM | Admit: 2018-07-23 | Discharge: 2018-07-25 | DRG: 246 | Disposition: A | Discharge status: Home / Self Care | Payer: Medicare Other | Attending: Cardiology | Admitting: Cardiology

## 2018-07-23 ENCOUNTER — Other Ambulatory Visit: Payer: Self-pay

## 2018-07-23 ENCOUNTER — Inpatient Hospital Stay (HOSPITAL_COMMUNITY): Payer: Medicare Other

## 2018-07-23 ENCOUNTER — Telehealth: Payer: Self-pay | Admitting: Internal Medicine

## 2018-07-23 DIAGNOSIS — I2119 ST elevation (STEMI) myocardial infarction involving other coronary artery of inferior wall: Secondary | ICD-10-CM | POA: Diagnosis present

## 2018-07-23 DIAGNOSIS — E119 Type 2 diabetes mellitus without complications: Secondary | ICD-10-CM | POA: Diagnosis present

## 2018-07-23 DIAGNOSIS — E785 Hyperlipidemia, unspecified: Secondary | ICD-10-CM | POA: Diagnosis present

## 2018-07-23 DIAGNOSIS — R570 Cardiogenic shock: Secondary | ICD-10-CM | POA: Diagnosis present

## 2018-07-23 DIAGNOSIS — I2510 Atherosclerotic heart disease of native coronary artery without angina pectoris: Secondary | ICD-10-CM

## 2018-07-23 DIAGNOSIS — I25700 Atherosclerosis of coronary artery bypass graft(s), unspecified, with unstable angina pectoris: Secondary | ICD-10-CM

## 2018-07-23 DIAGNOSIS — I213 ST elevation (STEMI) myocardial infarction of unspecified site: Secondary | ICD-10-CM

## 2018-07-23 DIAGNOSIS — Z955 Presence of coronary angioplasty implant and graft: Secondary | ICD-10-CM

## 2018-07-23 DIAGNOSIS — I10 Essential (primary) hypertension: Secondary | ICD-10-CM | POA: Diagnosis present

## 2018-07-23 DIAGNOSIS — Z833 Family history of diabetes mellitus: Secondary | ICD-10-CM | POA: Diagnosis not present

## 2018-07-23 DIAGNOSIS — J45909 Unspecified asthma, uncomplicated: Secondary | ICD-10-CM | POA: Diagnosis present

## 2018-07-23 DIAGNOSIS — I152 Hypertension secondary to endocrine disorders: Secondary | ICD-10-CM | POA: Diagnosis present

## 2018-07-23 DIAGNOSIS — R7303 Prediabetes: Secondary | ICD-10-CM | POA: Diagnosis not present

## 2018-07-23 DIAGNOSIS — D539 Nutritional anemia, unspecified: Secondary | ICD-10-CM | POA: Diagnosis present

## 2018-07-23 DIAGNOSIS — Z8249 Family history of ischemic heart disease and other diseases of the circulatory system: Secondary | ICD-10-CM

## 2018-07-23 DIAGNOSIS — I25110 Atherosclerotic heart disease of native coronary artery with unstable angina pectoris: Secondary | ICD-10-CM

## 2018-07-23 DIAGNOSIS — Z9861 Coronary angioplasty status: Secondary | ICD-10-CM | POA: Insufficient documentation

## 2018-07-23 DIAGNOSIS — K219 Gastro-esophageal reflux disease without esophagitis: Secondary | ICD-10-CM | POA: Diagnosis present

## 2018-07-23 DIAGNOSIS — E1169 Type 2 diabetes mellitus with other specified complication: Secondary | ICD-10-CM | POA: Diagnosis present

## 2018-07-23 DIAGNOSIS — I2111 ST elevation (STEMI) myocardial infarction involving right coronary artery: Secondary | ICD-10-CM

## 2018-07-23 DIAGNOSIS — I255 Ischemic cardiomyopathy: Secondary | ICD-10-CM | POA: Insufficient documentation

## 2018-07-23 DIAGNOSIS — R079 Chest pain, unspecified: Secondary | ICD-10-CM | POA: Diagnosis not present

## 2018-07-23 HISTORY — DX: Atherosclerotic heart disease of native coronary artery without angina pectoris: I25.10

## 2018-07-23 HISTORY — PX: CORONARY/GRAFT ACUTE MI REVASCULARIZATION: CATH118305

## 2018-07-23 HISTORY — DX: Acute myocardial infarction, unspecified: I21.9

## 2018-07-23 HISTORY — PX: TRANSTHORACIC ECHOCARDIOGRAM: SHX275

## 2018-07-23 HISTORY — PX: RIGHT HEART CATH: CATH118263

## 2018-07-23 HISTORY — PX: LEFT HEART CATH AND CORONARY ANGIOGRAPHY: CATH118249

## 2018-07-23 LAB — POCT I-STAT 7, (LYTES, BLD GAS, ICA,H+H)
Acid-base deficit: 7 mmol/L — ABNORMAL HIGH (ref 0.0–2.0)
Bicarbonate: 17.5 mmol/L — ABNORMAL LOW (ref 20.0–28.0)
Calcium, Ion: 1.15 mmol/L (ref 1.15–1.40)
HCT: 27 % — ABNORMAL LOW (ref 39.0–52.0)
Hemoglobin: 9.2 g/dL — ABNORMAL LOW (ref 13.0–17.0)
O2 Saturation: 89 %
Potassium: 4 mmol/L (ref 3.5–5.1)
Sodium: 136 mmol/L (ref 135–145)
TCO2: 18 mmol/L — ABNORMAL LOW (ref 22–32)
pCO2 arterial: 32.4 mmHg (ref 32.0–48.0)
pH, Arterial: 7.341 — ABNORMAL LOW (ref 7.350–7.450)
pO2, Arterial: 59 mmHg — ABNORMAL LOW (ref 83.0–108.0)

## 2018-07-23 LAB — CBC
HCT: 33.9 % — ABNORMAL LOW (ref 39.0–52.0)
HCT: 32.3 % — ABNORMAL LOW (ref 39.0–52.0)
Hemoglobin: 12.4 g/dL — ABNORMAL LOW (ref 13.0–17.0)
Hemoglobin: 11.4 g/dL — ABNORMAL LOW (ref 13.0–17.0)
MCH: 37.8 pg — ABNORMAL HIGH (ref 26.0–34.0)
MCH: 37 pg — ABNORMAL HIGH (ref 26.0–34.0)
MCHC: 36.6 g/dL — ABNORMAL HIGH (ref 30.0–36.0)
MCHC: 35.3 g/dL (ref 30.0–36.0)
MCV: 103.4 fL — ABNORMAL HIGH (ref 80.0–100.0)
MCV: 104.9 fL — ABNORMAL HIGH (ref 80.0–100.0)
Platelets: 196 10*3/uL (ref 150–400)
Platelets: 229 10*3/uL (ref 150–400)
RBC: 3.28 MIL/uL — ABNORMAL LOW (ref 4.22–5.81)
RBC: 3.08 MIL/uL — ABNORMAL LOW (ref 4.22–5.81)
RDW: 14.7 % (ref 11.5–15.5)
RDW: 14.7 % (ref 11.5–15.5)
WBC: 5 10*3/uL (ref 4.0–10.5)
WBC: 6.2 10*3/uL (ref 4.0–10.5)
nRBC: 0 % (ref 0.0–0.2)
nRBC: 0.3 % — ABNORMAL HIGH (ref 0.0–0.2)

## 2018-07-23 LAB — POCT I-STAT EG7
Acid-base deficit: 6 mmol/L — ABNORMAL HIGH (ref 0.0–2.0)
Acid-base deficit: 7 mmol/L — ABNORMAL HIGH (ref 0.0–2.0)
Bicarbonate: 18.3 mmol/L — ABNORMAL LOW (ref 20.0–28.0)
Bicarbonate: 19.3 mmol/L — ABNORMAL LOW (ref 20.0–28.0)
Calcium, Ion: 1.14 mmol/L — ABNORMAL LOW (ref 1.15–1.40)
Calcium, Ion: 1.17 mmol/L (ref 1.15–1.40)
HCT: 28 % — ABNORMAL LOW (ref 39.0–52.0)
HCT: 28 % — ABNORMAL LOW (ref 39.0–52.0)
Hemoglobin: 9.5 g/dL — ABNORMAL LOW (ref 13.0–17.0)
Hemoglobin: 9.5 g/dL — ABNORMAL LOW (ref 13.0–17.0)
O2 Saturation: 68 %
O2 Saturation: 68 %
Potassium: 4 mmol/L (ref 3.5–5.1)
Potassium: 4.2 mmol/L (ref 3.5–5.1)
Sodium: 137 mmol/L (ref 135–145)
Sodium: 138 mmol/L (ref 135–145)
TCO2: 19 mmol/L — ABNORMAL LOW (ref 22–32)
TCO2: 20 mmol/L — ABNORMAL LOW (ref 22–32)
pCO2, Ven: 33.5 mmHg — ABNORMAL LOW (ref 44.0–60.0)
pCO2, Ven: 35 mmHg — ABNORMAL LOW (ref 44.0–60.0)
pH, Ven: 7.346 (ref 7.250–7.430)
pH, Ven: 7.35 (ref 7.250–7.430)
pO2, Ven: 37 mmHg (ref 32.0–45.0)
pO2, Ven: 37 mmHg (ref 32.0–45.0)

## 2018-07-23 LAB — HEMOGLOBIN A1C
Hgb A1c MFr Bld: 6.6 % — ABNORMAL HIGH (ref 4.8–5.6)
Mean Plasma Glucose: 142.72 mg/dL

## 2018-07-23 LAB — COMPREHENSIVE METABOLIC PANEL
ALT: 29 U/L (ref 0–44)
AST: 36 U/L (ref 15–41)
Albumin: 3.7 g/dL (ref 3.5–5.0)
Alkaline Phosphatase: 69 U/L (ref 38–126)
Anion gap: 9 (ref 5–15)
BUN: 24 mg/dL — ABNORMAL HIGH (ref 8–23)
CO2: 19 mmol/L — ABNORMAL LOW (ref 22–32)
Calcium: 9.1 mg/dL (ref 8.9–10.3)
Chloride: 108 mmol/L (ref 98–111)
Creatinine, Ser: 1.2 mg/dL (ref 0.61–1.24)
Glucose, Bld: 147 mg/dL — ABNORMAL HIGH (ref 70–99)
Potassium: 4.2 mmol/L (ref 3.5–5.1)
Sodium: 136 mmol/L (ref 135–145)
Total Bilirubin: 1.1 mg/dL (ref 0.3–1.2)
Total Protein: 6.5 g/dL (ref 6.5–8.1)

## 2018-07-23 LAB — PROTIME-INR
INR: 1 (ref 0.8–1.2)
Prothrombin Time: 13.5 seconds (ref 11.4–15.2)

## 2018-07-23 LAB — LIPID PANEL
Cholesterol: 142 mg/dL (ref 0–200)
HDL: 76 mg/dL (ref >40)
LDL Cholesterol: 54 mg/dL (ref 0–99)
Total CHOL/HDL Ratio: 1.9 RATIO
Triglycerides: 61 mg/dL (ref <150)
VLDL: 12 mg/dL (ref 0–40)

## 2018-07-23 LAB — APTT: aPTT: 24 seconds (ref 24–36)

## 2018-07-23 LAB — POCT I-STAT CREATININE: Creatinine, Ser: 0.7 mg/dL (ref 0.61–1.24)

## 2018-07-23 LAB — TROPONIN I
Troponin I: 0.72 ng/mL (ref <0.03)
Troponin I: 2.26 ng/mL (ref <0.03)
Troponin I: 16.55 ng/mL (ref <0.03)

## 2018-07-23 LAB — MRSA PCR SCREENING: MRSA by PCR: NEGATIVE

## 2018-07-23 LAB — POCT ACTIVATED CLOTTING TIME
Activated Clotting Time: 208 seconds
Activated Clotting Time: 213 seconds
Activated Clotting Time: 252 seconds

## 2018-07-23 LAB — GLUCOSE, CAPILLARY: Glucose-Capillary: 139 mg/dL — ABNORMAL HIGH (ref 70–99)

## 2018-07-23 LAB — ECHOCARDIOGRAM COMPLETE: Weight: 3040 oz

## 2018-07-23 SURGERY — CORONARY/GRAFT ACUTE MI REVASCULARIZATION
Anesthesia: LOCAL

## 2018-07-23 MED ORDER — SODIUM CHLORIDE 0.9% FLUSH
3.0000 mL | Freq: Two times a day (BID) | INTRAVENOUS | Status: DC
  Administered 2018-07-24 (×2): 3 mL via INTRAVENOUS

## 2018-07-23 MED ORDER — MONTELUKAST SODIUM 10 MG PO TABS
10.0000 mg | ORAL_TABLET | Freq: Every day | ORAL | Status: DC
  Administered 2018-07-24: 22:00:00 10 mg via ORAL
  Filled 2018-07-23: qty 1

## 2018-07-23 MED ORDER — PANTOPRAZOLE SODIUM 40 MG PO TBEC: 40.0000 mg | DELAYED_RELEASE_TABLET | Freq: Every day | ORAL | Status: DC

## 2018-07-23 MED ORDER — ROSUVASTATIN CALCIUM 20 MG PO TABS
40.0000 mg | ORAL_TABLET | Freq: Every day | ORAL | Status: DC
  Administered 2018-07-24 – 2018-07-25 (×2): 40 mg via ORAL
  Filled 2018-07-23 (×2): qty 2

## 2018-07-23 MED ORDER — ASPIRIN 300 MG RE SUPP: 300.0000 mg | RECTAL | Status: DC

## 2018-07-23 MED ORDER — CARVEDILOL 3.125 MG PO TABS: 3.1250 mg | ORAL_TABLET | Freq: Two times a day (BID) | ORAL | Status: DC

## 2018-07-23 MED ORDER — DOPAMINE-DEXTROSE 3.2-5 MG/ML-% IV SOLN
INTRAVENOUS | Status: DC | PRN
  Administered 2018-07-23: 20 ug/kg/min via INTRAVENOUS

## 2018-07-23 MED ORDER — TIROFIBAN (AGGRASTAT) BOLUS VIA INFUSION
INTRAVENOUS | Status: DC | PRN
  Administered 2018-07-23: 2154.575 ug via INTRAVENOUS

## 2018-07-23 MED ORDER — TIROFIBAN HCL IN NACL 5-0.9 MG/100ML-% IV SOLN
INTRAVENOUS | Status: AC | PRN
  Administered 2018-07-23: 0.15 ug/kg/min via INTRAVENOUS

## 2018-07-23 MED ORDER — ASPIRIN EC 81 MG PO TBEC
81.0000 mg | DELAYED_RELEASE_TABLET | Freq: Every day | ORAL | Status: DC
  Administered 2018-07-24 – 2018-07-25 (×2): 81 mg via ORAL
  Filled 2018-07-23 (×2): qty 1

## 2018-07-23 MED ORDER — ONDANSETRON HCL 4 MG/2ML IJ SOLN: 4.0000 mg | Freq: Four times a day (QID) | INTRAMUSCULAR | Status: DC | PRN

## 2018-07-23 MED ORDER — MERCAPTOPURINE 50 MG PO TABS: 50.0000 mg | ORAL_TABLET | Freq: Every day | ORAL | Status: DC

## 2018-07-23 MED ORDER — TICAGRELOR 90 MG PO TABS
90.0000 mg | ORAL_TABLET | Freq: Two times a day (BID) | ORAL | Status: DC
  Administered 2018-07-23 – 2018-07-25 (×4): 90 mg via ORAL
  Filled 2018-07-23 (×4): qty 1

## 2018-07-23 MED ORDER — ORAL CARE MOUTH RINSE: 15.0000 mL | Freq: Two times a day (BID) | OROMUCOSAL | Status: DC

## 2018-07-23 MED ORDER — MERCAPTOPURINE 50 MG PO TABS
50.0000 mg | ORAL_TABLET | Freq: Every day | ORAL | Status: DC
  Administered 2018-07-24 – 2018-07-25 (×2): 50 mg via ORAL
  Filled 2018-07-23 (×2): qty 1

## 2018-07-23 MED ORDER — HEPARIN SODIUM (PORCINE) 1000 UNIT/ML IJ SOLN
INTRAMUSCULAR | Status: DC | PRN
  Administered 2018-07-23: 8000 [IU] via INTRAVENOUS
  Administered 2018-07-23: 4000 [IU] via INTRAVENOUS

## 2018-07-23 MED ORDER — ACETAMINOPHEN 325 MG PO TABS: 650.0000 mg | ORAL_TABLET | ORAL | Status: DC | PRN

## 2018-07-23 MED ORDER — NOREPINEPHRINE 4 MG/250ML-% IV SOLN
2.0000 ug/min | INTRAVENOUS | Status: DC
  Administered 2018-07-24: 2 ug/min via INTRAVENOUS
  Filled 2018-07-23: qty 250

## 2018-07-23 MED ORDER — ATROPINE SULFATE 1 MG/10ML IJ SOSY
PREFILLED_SYRINGE | INTRAMUSCULAR | Status: DC | PRN
  Administered 2018-07-23: 1 mg via INTRAVENOUS

## 2018-07-23 MED ORDER — MIDAZOLAM HCL 2 MG/2ML IJ SOLN
INTRAMUSCULAR | Status: DC | PRN
  Administered 2018-07-23: 1 mg via INTRAVENOUS

## 2018-07-23 MED ORDER — BIVALIRUDIN BOLUS VIA INFUSION - CUPID: INTRAVENOUS | Status: DC | PRN

## 2018-07-23 MED ORDER — FOLIC ACID 1 MG PO TABS
1.0000 mg | ORAL_TABLET | Freq: Every day | ORAL | Status: DC
  Administered 2018-07-24 – 2018-07-25 (×2): 1 mg via ORAL
  Filled 2018-07-23 (×2): qty 1

## 2018-07-23 MED ORDER — ASPIRIN 81 MG PO CHEW: 324.0000 mg | CHEWABLE_TABLET | ORAL | Status: DC

## 2018-07-23 MED ORDER — HEPARIN SODIUM (PORCINE) 5000 UNIT/ML IJ SOLN
5000.0000 [IU] | Freq: Three times a day (TID) | INTRAMUSCULAR | Status: DC
  Administered 2018-07-23 – 2018-07-24 (×2): 5000 [IU] via SUBCUTANEOUS
  Filled 2018-07-23 (×2): qty 1

## 2018-07-23 MED ORDER — LIDOCAINE HCL (PF) 1 % IJ SOLN
INTRAMUSCULAR | Status: DC | PRN
  Administered 2018-07-23: 2 mL
  Administered 2018-07-23: 15 mL

## 2018-07-23 MED ORDER — BUDESONIDE ER 9 MG PO TB24: 1.0000 | ORAL_TABLET | Freq: Every day | ORAL | Status: DC

## 2018-07-23 MED ORDER — FENTANYL CITRATE (PF) 100 MCG/2ML IJ SOLN
INTRAMUSCULAR | Status: DC | PRN
  Administered 2018-07-23 (×2): 25 ug via INTRAVENOUS

## 2018-07-23 MED ORDER — IOHEXOL 350 MG/ML SOLN
INTRAVENOUS | Status: DC | PRN
  Administered 2018-07-23: 120 mL via INTRA_ARTERIAL

## 2018-07-23 MED ORDER — SODIUM CHLORIDE 0.9 % IV SOLN
250.0000 mL | INTRAVENOUS | Status: DC | PRN
  Administered 2018-07-24: 250 mL via INTRAVENOUS

## 2018-07-23 MED ORDER — MORPHINE SULFATE (PF) 2 MG/ML IV SOLN: 2.0000 mg | INTRAVENOUS | Status: DC | PRN

## 2018-07-23 MED ORDER — SODIUM CHLORIDE 0.9 % IV SOLN
INTRAVENOUS | Status: AC
  Administered 2018-07-23: 19:00:00 via INTRAVENOUS

## 2018-07-23 MED ORDER — MOMETASONE FURO-FORMOTEROL FUM 200-5 MCG/ACT IN AERO
2.0000 | INHALATION_SPRAY | Freq: Two times a day (BID) | RESPIRATORY_TRACT | Status: DC | PRN
  Administered 2018-07-24: 2 via RESPIRATORY_TRACT
  Filled 2018-07-23: qty 8.8

## 2018-07-23 MED ORDER — SODIUM CHLORIDE 0.9 % IV SOLN
INTRAVENOUS | Status: AC | PRN
  Administered 2018-07-23: 10 mL/h via INTRAVENOUS

## 2018-07-23 MED ORDER — PERFLUTREN LIPID MICROSPHERE
1.0000 mL | INTRAVENOUS | Status: AC | PRN
  Administered 2018-07-23: 16:00:00 3 mL via INTRAVENOUS
  Filled 2018-07-23: qty 10

## 2018-07-23 MED ORDER — SODIUM CHLORIDE 0.9 % IV SOLN: INTRAVENOUS | Status: DC | PRN

## 2018-07-23 MED ORDER — TIROFIBAN HCL IN NACL 5-0.9 MG/100ML-% IV SOLN
0.1500 ug/kg/min | INTRAVENOUS | Status: AC
  Administered 2018-07-23 (×2): 0.15 ug/kg/min via INTRAVENOUS
  Filled 2018-07-23: qty 100
  Filled 2018-07-23: qty 250

## 2018-07-23 MED ORDER — TICAGRELOR 90 MG PO TABS
ORAL_TABLET | ORAL | Status: DC | PRN
  Administered 2018-07-23: 180 mg via ORAL

## 2018-07-23 MED ORDER — ROSUVASTATIN CALCIUM 5 MG PO TABS: 10.0000 mg | ORAL_TABLET | Freq: Every day | ORAL | Status: DC

## 2018-07-23 MED ORDER — LOPERAMIDE HCL 2 MG PO CAPS: 2.0000 mg | ORAL_CAPSULE | Freq: Four times a day (QID) | ORAL | Status: DC | PRN

## 2018-07-23 MED ORDER — PANTOPRAZOLE SODIUM 40 MG PO TBEC
40.0000 mg | DELAYED_RELEASE_TABLET | Freq: Every day | ORAL | Status: DC
  Administered 2018-07-24 – 2018-07-25 (×2): 40 mg via ORAL
  Filled 2018-07-23 (×2): qty 1

## 2018-07-23 MED ORDER — NOREPINEPHRINE BITARTRATE 1 MG/ML IV SOLN
INTRAVENOUS | Status: AC | PRN
  Administered 2018-07-23: 10 ug/min via INTRAVENOUS

## 2018-07-23 MED ORDER — FOLIC ACID 1 MG PO TABS: 1.0000 mg | ORAL_TABLET | Freq: Every day | ORAL | Status: DC

## 2018-07-23 MED ORDER — SODIUM CHLORIDE 0.9% FLUSH
3.0000 mL | INTRAVENOUS | Status: DC | PRN
  Administered 2018-07-24: 3 mL via INTRAVENOUS
  Filled 2018-07-23: qty 3

## 2018-07-23 MED ORDER — NITROGLYCERIN 0.4 MG SL SUBL: 0.4000 mg | SUBLINGUAL_TABLET | SUBLINGUAL | Status: DC | PRN

## 2018-07-23 SURGICAL SUPPLY — 18 items
BALLN SAPPHIRE 2.5X15 (BALLOONS) ×2
BALLOON SAPPHIRE 2.5X15 (BALLOONS) ×1 IMPLANT
CATH INFINITI 5 FR JL3.5 (CATHETERS) ×1 IMPLANT
CATH LAUNCHER 6FR JR4 (CATHETERS) ×1 IMPLANT
CATH OPTITORQUE TIG 4.0 5F (CATHETERS) ×1 IMPLANT
CATH SWAN GANZ 7F STRAIGHT (CATHETERS) ×1 IMPLANT
DEVICE RAD COMP TR BAND LRG (VASCULAR PRODUCTS) ×1 IMPLANT
GLIDESHEATH SLEND SS 6F .021 (SHEATH) ×1 IMPLANT
GUIDEWIRE INQWIRE 1.5J.035X260 (WIRE) IMPLANT
INQWIRE 1.5J .035X260CM (WIRE) ×2
KIT ENCORE 26 ADVANTAGE (KITS) ×1 IMPLANT
KIT HEART LEFT (KITS) ×2 IMPLANT
PACK CARDIAC CATHETERIZATION (CUSTOM PROCEDURE TRAY) ×2 IMPLANT
SHEATH PINNACLE 7F 10CM (SHEATH) ×1 IMPLANT
STENT RESOLUTE ONYX 3.5X26 (Permanent Stent) ×1 IMPLANT
TRANSDUCER W/STOPCOCK (MISCELLANEOUS) ×2 IMPLANT
TUBING CIL FLEX 10 FLL-RA (TUBING) ×2 IMPLANT
WIRE ASAHI PROWATER 180CM (WIRE) ×1 IMPLANT

## 2018-07-23 NOTE — TOC Benefit Eligibility Note (Signed)
Transition of Care [facility]) Benefit Eligibility Note    Patient Details  Name: Darryl [name] MRN: [id number] Date of Birth: 1950/11/19   Medication/Dose: P/A-NO(TICAGRELOR - COVER- YES, CO-PAY-$ 35.00, TIER- 2 DRUG ,P/A-NO)  Covered?: Yes  Tier: 2 Drug  Prescription Coverage Preferred Pharmacy: YES([facility])  Spoke with Person/Company/Phone Number:: SHELIA([employer] # [phone])  Co-Pay: $ 35.00  Prior Approval: No  Deductible: Met  Additional Notes: 90 DAY SUPPLY FOR M/O OR RETAIL $70.00    Memory Argue Phone Number: 07/23/2018, 3:28 PM

## 2018-07-23 NOTE — Telephone Encounter (Signed)
Spoke with patients wife, patient was already on the phone with EMS. Wife reports the patient started to have chest pain with pain radiating down the left arm, reported the patient was "pale and sweaty, short of breath and panting." The wife attributed these symptoms to the Allopurinol the patient was prescribed 3/10. The wife reported similar symptoms to a  previous time the patient was prescribed the same medication. This RN did advice the wife to encourage the patient to remain on the line with EMS and report to the Emergency Department for evaluation.

## 2018-07-23 NOTE — Telephone Encounter (Signed)
Pt stated that he had an allergic reaction to allopurinol and has stopped taking it--pt reported cold sweats, having feelings like a heart attack and that he would faint.  He stated that the medication makes him ache all day.  Please return his call.

## 2018-07-23 NOTE — Telephone Encounter (Signed)
Agree with evaluation in the emergency department for chest pain concerning for ACS He has not yet been seen in the ER I will follow-up with him and hope that he does well

## 2018-07-23 NOTE — TOC Benefit Eligibility Note (Signed)
Transition of Care [facility]) Benefit Eligibility Note    Patient Details  Name: RADWAN COWLEY MRN: [id number] Date of Birth: 1950/09/15   Medication/Dose: P/A-NO(TICAGRELOR - COVER- YES, CO-PAY-$ 35.00, TIER- 2 DRUG ,P/A-NO)  Covered?: Yes  Tier: 2 Drug  Prescription Coverage Preferred Pharmacy: YES([facility])  Spoke with Person/Company/Phone Number:: SHELIA([employer] # [phone])  Co-Pay: $ 35.00  Prior Approval: No  Deductible: Met       Memory Argue Phone Number: 07/23/2018, 3:23 PM

## 2018-07-23 NOTE — Care Management (Signed)
Brilinta benefits check sent and pending.  Midge Minium RN, BSN, NCM-BC, ACM-RN [phone]

## 2018-07-23 NOTE — H&P (Signed)
History & Physical    Patient ID: Darryl [name] MRN: [id number], DOB/AGE: May 25, 1950   Admit date: 07/23/2018  Primary Physician: Janith Lima, MD Primary Cardiologist: New to [facility]  Patient Profile    [name] is a 68 yo F with a hx of HTN, HLD and GERD who presented to [facility] with an inferior STEMI.   Past Medical History   Past Medical History:  Diagnosis Date   Acute upper respiratory infections of unspecified site    Allergic rhinitis, cause unspecified    Anal stenosis    Asthma    Cataract    Crohn's colitis ([place])    Dizziness and giddiness    Exostosis of unspecified site    GERD (gastroesophageal reflux disease)    Hyperplastic colon polyp 02/26/2008   Hypertension    Lateral epicondylitis  of elbow    Nontraumatic rupture of patellar tendon    Obesity, unspecified    Regional enteritis of unspecified site    Seasonal allergies    Sessile colonic polyp    Vitamin B12 deficiency     Past Surgical History:  Procedure Laterality Date   COLONOSCOPY     2016   TONSILLECTOMY AND ADENOIDECTOMY       Allergies  Allergies  Allergen Reactions   Asacol [Mesalamine] Hives    Trouble swallowing, loss of appetite   Amoxicillin Rash   Sulfamethoxazole Rash   History of Present Illness    [name] is a 68 yo M with a hx as stated above who presented after having several days of having chest pain in the mornings but always resolved.  He also noted having some exertional chest pain while playing golf 2 or 3 days ago.  Each time the chest pain resolved spontaneously and he felt it was more consistent with GI medication.  Pt reported that today the pain became more intense and worrisome and he therefore called his GI doctor thinking this related to his GERD. He was then instructed to call EMS for transport to the ED for further evaluation. He has no prior personal hx of CAD and a family hx of CAD in his father. He denies tobacco, alcohol  and illicit drug use.   On EMS evaluation, the pain actually had resolved, but he still had EKG changes consistent with inferior ST elevations in II, 3 and aVF of roughly 2 mm with some reciprocal changes).  Code STEMI was called, and he was brought to [facility]  He did have another recurrence of the chest discomfort once he was placed in the EMS truck, but this was resolved by time he got to the ER.  Was chest pain-free upon arrival to the Cath Lab.  On ED arrival (11:52 AM), EKG was performed which showed and an acute inferior STEMI. Pt was brought to the cath lab for assessment and possible intervention. Labs performed in the cath lab which showed a creatinine of 1.24 and otherwise normal lab results.   Home Medications    Prior to Admission medications   Medication Sig Start Date End Date Taking? Authorizing Provider  allopurinol (ZYLOPRIM) 100 MG tablet Take 1 tablet (100 mg total) by mouth daily. 06/02/18   Pyrtle, Lajuan Lines, MD  Budesonide ER (UCERIS) 9 MG TB24 Take 1 tablet by mouth daily. 04/28/18   Pyrtle, Lajuan Lines, MD  Cholecalciferol (VITAMIN D3) 1000 units CAPS Take 1 capsule by mouth daily.    [provider]  fluticasone-salmeterol (ADVAIR  HFA) 115-21 MCG/ACT inhaler USE 2 PUFFS TWICE DAILY AS NEEDED. SHAKE WELL 04/09/18   Janith Lima, MD  folic acid (FOLVITE) 1 MG tablet TAKE 1 TABLET ONCE DAILY. 11/14/16   Pyrtle, Lajuan Lines, MD  hydrocortisone 2.5 % cream  07/28/17   [provider]  loperamide (IMODIUM A-D) 2 MG tablet Take 2 mg by mouth 4 (four) times daily as needed for diarrhea or loose stools.    [provider]  mercaptopurine (PURINETHOL) 50 MG tablet Take 1 tablet (50 mg total) by mouth daily. 06/25/18   Pyrtle, Lajuan Lines, MD  montelukast (SINGULAIR) 10 MG tablet TAKE ONE TABLET AT BEDTIME. 04/13/18   Janith Lima, MD  Multiple Vitamins-Minerals (CENTRUM SILVER PO) Take 1 tablet by mouth daily.    [provider]  Omega-3 Fatty Acids  (DIALYVITE OMEGA-3 CONCENTRATE) 600 MG CAPS Take 1 tablet by mouth 2 (two) times daily.    [provider]  pantoprazole (PROTONIX) 40 MG tablet TAKE 1 TABLET ONCE DAILY. 03/10/18   Janith Lima, MD  rosuvastatin (CRESTOR) 10 MG tablet Take 1 tablet (10 mg total) by mouth daily. 02/23/18   Janith Lima, MD  telmisartan (MICARDIS) 40 MG tablet TAKE 1 TABLET ONCE DAILY. 03/10/18   Janith Lima, MD  traMADol (ULTRAM) 50 MG tablet TAKE ONE TABLET EVERY 6 HOURS AS NEEDED. 04/28/18   Janith Lima, MD  VENTOLIN HFA 108 (90 Base) MCG/ACT inhaler USE 2 PUFFS EVERY 6 HOURS AS NEEDED FOR WHEEZING. **SHAKE WELL** 03/05/18   Janith Lima, MD  vitamin B-12 (CYANOCOBALAMIN) 1000 MCG tablet Take 1 tablet (1,000 mcg total) by mouth daily. 08/11/13   Pyrtle, Lajuan Lines, MD    Family History    Family History  Problem Relation Age of Onset   Diabetes Brother    Diabetes Father    Heart attack Father    Hypertension Father    Heart disease Father    Colon cancer Neg Hx    Stomach cancer Neg Hx    Hyperlipidemia Neg Hx    Sudden death Neg Hx    Cancer Neg Hx    COPD Neg Hx    Alcohol abuse Neg Hx    Drug abuse Neg Hx    Stroke Neg Hx    Rectal cancer Neg Hx    Esophageal cancer Neg Hx     Social History    Social History   Socioeconomic History   Marital status: Married    Spouse name: Not on file   Number of children: Not on file   Years of education: Not on file   Highest education level: Not on file  Occupational History   Occupation: Optometrist  Social Needs   Financial resource strain: Not on file   Food insecurity:    Worry: Not on file    Inability: Not on file   Transportation needs:    Medical: Not on file    Non-medical: Not on file  Tobacco Use   Smoking status: Never Smoker   Smokeless tobacco: Never Used  Substance and Sexual Activity   Alcohol use: Yes    Alcohol/week: 0.0 standard drinks    Comment: rare   Drug use: No    Sexual activity: Yes  Lifestyle   Physical activity:    Days per week: Not on file    Minutes per session: Not on file   Stress: Not on file  Relationships   Social connections:  Talks on phone: Not on file    Gets together: Not on file    Attends religious service: Not on file    Active member of club or organization: Not on file    Attends meetings of clubs or organizations: Not on file    Relationship status: Not on file   Intimate partner violence:    Fear of current or ex partner: Not on file    Emotionally abused: Not on file    Physically abused: Not on file    Forced sexual activity: Not on file  Other Topics Concern   Not on file  Social History Narrative   Not on file    Review of Systems   See HPI Patient has chronic abdominal pain issues for which she sees Dr. Hilarie Fredrickson.  Does have intermittent loose stools, but not anything recently.  No recent fevers, chills, sweats.  No recent sudden onset dyspnea.  No sick contacts. All other systems reviewed and are otherwise negative except as noted above.  Physical Exam    SpO2 100 %.   General: Pleasant, well-nourished, A&O mild amount of anxiety and distress but no overt acute distress.  Was pain-free.  Appears stated age Psych: Normal affect. Neuro: Alert and oriented X 3. Moves all extremities spontaneously. HEENT: Eyes symmetric, no orbital edema. Conjunctive pink, sclera white, PERRLA, vision grossly intact.  Neck: Supple neck veins, no bruits or JVD.Trachea midline, no cervical lymphadenopathy.  Lungs: Symmetrical chest wall expansion.CTA w/ RRR. No accessory muscle use.  Heart: Normal S1 and S2.  RRR no s3, s4, or murmurs.  Abdomen: Soft, non-tender, non-distended, BS + x 4.  Extremities: No clubbing, cyanosis or edema. DP/PT/Radials 2+ and equal bilaterally. MSK: No palpable chest wall or thoracic tenderness  Labs    Troponin (Point of Care Test) No results for input(s): TROPIPOC in the last 72  hours. No results for input(s): CKTOTAL, CKMB, TROPONINI in the last 72 hours. Lab Results  Component Value Date   WBC 5.0 07/23/2018   HGB 12.4 (L) 07/23/2018   HCT 33.9 (L) 07/23/2018   MCV 103.4 (H) 07/23/2018   PLT 196 07/23/2018   No results for input(s): NA, K, CL, CO2, BUN, CREATININE, CALCIUM, PROT, BILITOT, ALKPHOS, ALT, AST, GLUCOSE in the last 168 hours.  Invalid input(s): LABALBU Lab Results  Component Value Date   CHOL 222 (H) 02/23/2018   HDL 67.20 02/23/2018   LDLCALC 137 (H) 02/23/2018   TRIG 90.0 02/23/2018   No results found for: [facility]   Radiology Studies    No results found.  ECG & Cardiac Imaging    07/23/2018 NSR with ST elevations in leads II, III, avF and ST depression in aVL. There is also ST elevation in V6.   Assessment & Plan    1. STEMI: -Pt presented with recent exertional chest pain while playing golf over the last several weeks. Pt reported that the pain worsened in intensity today which was worrisome and he therefore reached out to his [place] doctor who instructed him to call EMS with concern for acute ACS. EKG performed by EMS revealed acute STEMI and patient was transported directly to the cath lab for intervention -Cath results will further dictate plan of care -Will start ASA 81, BB and high intensity statin -Will obtain more current lipid panel and HbA1c for risk stratification   2. HTN: -If BP can tolerate, will start low dose carvedilol in the post procedure setting -On home Micardis 34m daily  -Monitor  closely   3. HLD: -Last LDL from 02/23/2018, 137 -On Crestor 40m daily>>will up titrate to high intensity  4. DM2: -New HbA1c, 6.6 -Will start SSI for glucose control while inpatient status     Severity of Illness: The appropriate patient status for this patient is INPATIENT. Inpatient status is judged to be reasonable and necessary in order to provide the required intensity of service to ensure the patient's safety. The  patient's presenting symptoms, physical exam findings, and initial radiographic and laboratory data in the context of their chronic comorbidities is felt to place them at high risk for further clinical deterioration. Furthermore, it is not anticipated that the patient will be medically stable for discharge from the hospital within 2 midnights of admission. The following factors support the patient status of inpatient.   " The patient's presenting symptoms include STEMI. " The worrisome physical exam findings include STEMI. " The initial radiographic and laboratory data are worrisome because of EKG. " The chronic co-morbidities include HTN, HLD.   * I certify that at the point of admission it is my clinical judgment that the patient will require inpatient hospital care spanning beyond 2 midnights from the point of admission due to high intensity of service, high risk for further deterioration and high frequency of surveillance required.*    Signed, JKathyrn DrownNP-C [facility] Pager: [phone]/30/2020, 1155 AM  ATTENDING ATTESTATION  I have seen, examined and evaluated the patient this morning along with JKathyrn Drown NP-C.  After reviewing all the available data and chart, we discussed the patients laboratory, study & physical findings as well as symptoms in detail. I agree with her findings, examination as well as impression recommendations as per our discussion.    Patient presented with an inferior ST elevation MI and was brought to the Cath Lab for PCI.  Cardiac cath revealed occluded RCA treated with DES stent.  During the PCI the patient did become profoundly hypotensive and bradycardic requiring atropine and dopamine initially that was converted to levo fed.  His blood pressures were stabilized on Levophed 10 mcg/min upon completion of the case.  A right heart cath was performed showing relatively stable right heart pressures and hyperdynamic cardiac outputs on pressors.  He was  therefore not felt to be in severe shock so no further support was added.  The right heart cath was removed.  He will be continued on Aggrastat for 12 hours post PCI.  He does have evidence of LAD CTO which should be treated medically at this time, and significant lesion in the Ramus Intermedius which would likely benefit from being treated as staged PCI.  Once the Ramus is treated, would consider viability evaluation of the anterior wall within 4 to 6 weeks with a Cardiac MRI.  Would also recommend 2D echocardiogram to better assess EF..Glenetta Hew M.D., M.S. Interventional Cardiologist   Pager # [phone] # [phone] [address] [place] [address]

## 2018-07-23 NOTE — Progress Notes (Signed)
ANTICOAGULATION CONSULT NOTE - Initial Consult  Pharmacy Consult for tirofiban Indication: chest pain/ACS  Allergies  Allergen Reactions  . Asacol [Mesalamine] Hives    Trouble swallowing, loss of appetite  . Amoxicillin Rash  . Sulfamethoxazole Rash    Patient Measurements: Weight: 190 lb (86.2 kg) Tirofiban Dosing Weight: 86 kg  Vital Signs: BP: 127/84 (04/30 1333) Pulse Rate: 84 (04/30 1333)  Labs: Recent Labs    07/23/18 1234  07/23/18 1257 07/23/18 1315 07/23/18 1318  HGB  --    < > 12.4* 9.2*  9.5* 9.5*  HCT  --   --  33.9* 27.0*  28.0* 28.0*  PLT  --   --  196  --   --   APTT  --   --  24  --   --   LABPROT  --   --  13.5  --   --   INR  --   --  1.0  --   --   CREATININE 0.70  --  1.20  --   --   TROPONINI  --   --  0.72*  --   --    < > = values in this interval not displayed.    Estimated Creatinine Clearance: 59.8 mL/min (by C-G formula based on SCr of 1.2 mg/dL).   Medical History: Past Medical History:  Diagnosis Date  . Acute upper respiratory infections of unspecified site   . Allergic rhinitis, cause unspecified   . Anal stenosis   . Asthma   . Cataract   . Crohn's colitis ([place])   . Dizziness and giddiness   . Exostosis of unspecified site   . GERD (gastroesophageal reflux disease)   . Hyperplastic colon polyp 02/26/2008  . Hypertension   . Lateral epicondylitis  of elbow   . Nontraumatic rupture of patellar tendon   . Obesity, unspecified   . Regional enteritis of unspecified site   . Seasonal allergies   . Sessile colonic polyp   . Vitamin B12 deficiency      Assessment: 74 yoM admitted as STEMI s/p LHC and DES placement to RCA. Pharmacy asked to dose tirofiban x12 hr post-cath. CrCl borderline for dose adjustment at 59 ml/min  Goal of Therapy:  Monitor platelets by anticoagulation protocol: Yes   Plan:  Tirofiban 0.075 mcg/kg/min x12 hr Check CBC this afternoon Pharmacy will sign off   Arrie Senate, PharmD,  BCPS Clinical Pharmacist [phone] Please check AMION for all [facility] Pharmacy numbers 07/23/2018

## 2018-07-23 NOTE — Progress Notes (Signed)
  Echocardiogram 2D Echocardiogram was attempted but patient was in the cath lab. We will try again later.   Jennette Dubin 07/23/2018, 2:10 PM

## 2018-07-23 NOTE — Progress Notes (Signed)
  Echocardiogram 2D Echocardiogram has been performed.  Marybelle Killings 07/23/2018, 4:28 PM

## 2018-07-24 ENCOUNTER — Encounter (HOSPITAL_COMMUNITY): Payer: Self-pay | Admitting: Cardiology

## 2018-07-24 ENCOUNTER — Encounter (HOSPITAL_COMMUNITY)
Admission: EM | Disposition: A | Discharge status: Home / Self Care | Payer: Self-pay | Source: Home / Self Care | Attending: Cardiology

## 2018-07-24 DIAGNOSIS — I2510 Atherosclerotic heart disease of native coronary artery without angina pectoris: Secondary | ICD-10-CM

## 2018-07-24 DIAGNOSIS — I25110 Atherosclerotic heart disease of native coronary artery with unstable angina pectoris: Secondary | ICD-10-CM

## 2018-07-24 HISTORY — PX: CORONARY STENT INTERVENTION: CATH118234

## 2018-07-24 HISTORY — PX: LEFT HEART CATH: CATH118248

## 2018-07-24 LAB — LIPID PANEL
Cholesterol: 117 mg/dL (ref 0–200)
HDL: 64 mg/dL (ref >40)
LDL Cholesterol: 39 mg/dL (ref 0–99)
Total CHOL/HDL Ratio: 1.8 RATIO
Triglycerides: 72 mg/dL (ref <150)
VLDL: 14 mg/dL (ref 0–40)

## 2018-07-24 LAB — BASIC METABOLIC PANEL
Anion gap: 9 (ref 5–15)
BUN: 16 mg/dL (ref 8–23)
CO2: 19 mmol/L — ABNORMAL LOW (ref 22–32)
Calcium: 8.4 mg/dL — ABNORMAL LOW (ref 8.9–10.3)
Chloride: 111 mmol/L (ref 98–111)
Creatinine, Ser: 1.2 mg/dL (ref 0.61–1.24)
GFR calc Af Amer: >60 mL/min (ref >60)
GFR calc non Af Amer: >60 mL/min (ref >60)
Glucose, Bld: 109 mg/dL — ABNORMAL HIGH (ref 70–99)
Potassium: 4.1 mmol/L (ref 3.5–5.1)
Sodium: 139 mmol/L (ref 135–145)

## 2018-07-24 LAB — HEMOGLOBIN A1C
Hgb A1c MFr Bld: 6.5 % — ABNORMAL HIGH (ref 4.8–5.6)
Mean Plasma Glucose: 139.85 mg/dL

## 2018-07-24 LAB — CBC
HCT: 33.5 % — ABNORMAL LOW (ref 39.0–52.0)
Hemoglobin: 12 g/dL — ABNORMAL LOW (ref 13.0–17.0)
MCH: 37.9 pg — ABNORMAL HIGH (ref 26.0–34.0)
MCHC: 35.8 g/dL (ref 30.0–36.0)
MCV: 105.7 fL — ABNORMAL HIGH (ref 80.0–100.0)
Platelets: 199 10*3/uL (ref 150–400)
RBC: 3.17 MIL/uL — ABNORMAL LOW (ref 4.22–5.81)
RDW: 15.1 % (ref 11.5–15.5)
WBC: 6.1 10*3/uL (ref 4.0–10.5)
nRBC: 0.3 % — ABNORMAL HIGH (ref 0.0–0.2)

## 2018-07-24 LAB — GLUCOSE, CAPILLARY: Glucose-Capillary: 134 mg/dL — ABNORMAL HIGH (ref 70–99)

## 2018-07-24 LAB — TROPONIN I: Troponin I: 24.35 ng/mL (ref <0.03)

## 2018-07-24 LAB — MAGNESIUM: Magnesium: 1.9 mg/dL (ref 1.7–2.4)

## 2018-07-24 LAB — POCT ACTIVATED CLOTTING TIME
Activated Clotting Time: 797 seconds
Activated Clotting Time: 241 seconds
Activated Clotting Time: 301 seconds
Activated Clotting Time: 423 seconds

## 2018-07-24 LAB — HIV ANTIBODY (ROUTINE TESTING W REFLEX): HIV Screen 4th Generation wRfx: NONREACTIVE

## 2018-07-24 SURGERY — CORONARY STENT INTERVENTION
Anesthesia: LOCAL

## 2018-07-24 MED ORDER — VERAPAMIL HCL 2.5 MG/ML IV SOLN
INTRAVENOUS | Status: AC
  Filled 2018-07-24: qty 2

## 2018-07-24 MED ORDER — MIDAZOLAM HCL 2 MG/2ML IJ SOLN
INTRAMUSCULAR | Status: DC | PRN
  Administered 2018-07-24: 2 mg via INTRAVENOUS

## 2018-07-24 MED ORDER — HYDRALAZINE HCL 20 MG/ML IJ SOLN: 10.0000 mg | INTRAMUSCULAR | Status: AC | PRN

## 2018-07-24 MED ORDER — ONDANSETRON HCL 4 MG/2ML IJ SOLN: 4.0000 mg | Freq: Four times a day (QID) | INTRAMUSCULAR | Status: DC | PRN

## 2018-07-24 MED ORDER — HEPARIN SODIUM (PORCINE) 5000 UNIT/ML IJ SOLN
5000.0000 [IU] | Freq: Three times a day (TID) | INTRAMUSCULAR | Status: DC
  Administered 2018-07-25: 5000 [IU] via SUBCUTANEOUS
  Filled 2018-07-24: qty 1

## 2018-07-24 MED ORDER — CARVEDILOL 3.125 MG PO TABS
3.1250 mg | ORAL_TABLET | Freq: Two times a day (BID) | ORAL | Status: DC
  Administered 2018-07-24 – 2018-07-25 (×2): 3.125 mg via ORAL
  Filled 2018-07-24 (×2): qty 1

## 2018-07-24 MED ORDER — SODIUM CHLORIDE 0.9 % IV SOLN
INTRAVENOUS | Status: AC
  Administered 2018-07-25 (×2): via INTRAVENOUS

## 2018-07-24 MED ORDER — HEPARIN (PORCINE) IN NACL 1000-0.9 UT/500ML-% IV SOLN
INTRAVENOUS | Status: AC
  Filled 2018-07-24: qty 1000

## 2018-07-24 MED ORDER — ASPIRIN 81 MG PO CHEW: 81.0000 mg | CHEWABLE_TABLET | Freq: Every day | ORAL | Status: DC

## 2018-07-24 MED ORDER — LIDOCAINE HCL (PF) 1 % IJ SOLN
INTRAMUSCULAR | Status: AC
  Filled 2018-07-24: qty 30

## 2018-07-24 MED ORDER — DIAZEPAM 5 MG PO TABS: 5.0000 mg | ORAL_TABLET | ORAL | Status: DC | PRN

## 2018-07-24 MED ORDER — SODIUM CHLORIDE 0.9% FLUSH: 3.0000 mL | INTRAVENOUS | Status: DC | PRN

## 2018-07-24 MED ORDER — ZOLPIDEM TARTRATE 5 MG PO TABS: 5.0000 mg | ORAL_TABLET | Freq: Every evening | ORAL | Status: DC | PRN

## 2018-07-24 MED ORDER — HEPARIN (PORCINE) IN NACL 1000-0.9 UT/500ML-% IV SOLN
INTRAVENOUS | Status: DC | PRN
  Administered 2018-07-24 (×2): 500 mL

## 2018-07-24 MED ORDER — SODIUM CHLORIDE 0.9% FLUSH
3.0000 mL | Freq: Two times a day (BID) | INTRAVENOUS | Status: DC
  Administered 2018-07-24 – 2018-07-25 (×2): 3 mL via INTRAVENOUS

## 2018-07-24 MED ORDER — FENTANYL CITRATE (PF) 100 MCG/2ML IJ SOLN
INTRAMUSCULAR | Status: DC | PRN
  Administered 2018-07-24: 25 ug via INTRAVENOUS

## 2018-07-24 MED ORDER — VERAPAMIL HCL 2.5 MG/ML IV SOLN
INTRAVENOUS | Status: DC | PRN
  Administered 2018-07-24: 15:00:00 10 mL via INTRA_ARTERIAL

## 2018-07-24 MED ORDER — SODIUM CHLORIDE 0.9 % IV SOLN
INTRAVENOUS | Status: AC
  Administered 2018-07-24: 1 mL via INTRAVENOUS

## 2018-07-24 MED ORDER — HEPARIN SODIUM (PORCINE) 1000 UNIT/ML IJ SOLN
INTRAMUSCULAR | Status: AC
  Filled 2018-07-24: qty 1

## 2018-07-24 MED ORDER — SODIUM CHLORIDE 0.9 % IV SOLN: 250.0000 mL | INTRAVENOUS | Status: DC | PRN

## 2018-07-24 MED ORDER — MAGNESIUM SULFATE IN D5W 1-5 GM/100ML-% IV SOLN
1.0000 g | Freq: Once | INTRAVENOUS | Status: AC
  Administered 2018-07-24: 13:00:00 1 g via INTRAVENOUS
  Filled 2018-07-24: qty 100

## 2018-07-24 MED ORDER — LIDOCAINE HCL (PF) 1 % IJ SOLN
INTRAMUSCULAR | Status: DC | PRN
  Administered 2018-07-24: 2 mL

## 2018-07-24 MED ORDER — SODIUM CHLORIDE 0.9 % WEIGHT BASED INFUSION
1.0000 mL/kg/h | INTRAVENOUS | Status: DC
  Administered 2018-07-24: 1 mL/kg/h via INTRAVENOUS

## 2018-07-24 MED ORDER — NITROGLYCERIN 1 MG/10 ML FOR IR/CATH LAB
INTRA_ARTERIAL | Status: AC
  Filled 2018-07-24: qty 10

## 2018-07-24 MED ORDER — MIDAZOLAM HCL 2 MG/2ML IJ SOLN
INTRAMUSCULAR | Status: AC
  Filled 2018-07-24: qty 2

## 2018-07-24 MED ORDER — ACETAMINOPHEN 325 MG PO TABS: 650.0000 mg | ORAL_TABLET | ORAL | Status: DC | PRN

## 2018-07-24 MED ORDER — TICAGRELOR 90 MG PO TABS: 90.0000 mg | ORAL_TABLET | Freq: Two times a day (BID) | ORAL | Status: DC

## 2018-07-24 MED ORDER — HEPARIN SODIUM (PORCINE) 1000 UNIT/ML IJ SOLN
INTRAMUSCULAR | Status: DC | PRN
  Administered 2018-07-24: 2000 [IU] via INTRAVENOUS
  Administered 2018-07-24: 1000 [IU] via INTRAVENOUS
  Administered 2018-07-24: 9000 [IU] via INTRAVENOUS

## 2018-07-24 MED ORDER — NITROGLYCERIN 1 MG/10 ML FOR IR/CATH LAB
INTRA_ARTERIAL | Status: DC | PRN
  Administered 2018-07-24: 100 ug via INTRACORONARY

## 2018-07-24 MED ORDER — FENTANYL CITRATE (PF) 100 MCG/2ML IJ SOLN
INTRAMUSCULAR | Status: AC
  Filled 2018-07-24: qty 2

## 2018-07-24 MED ORDER — IOHEXOL 350 MG/ML SOLN
INTRAVENOUS | Status: DC | PRN
  Administered 2018-07-24: 145 mL via INTRA_ARTERIAL

## 2018-07-24 MED ORDER — SODIUM CHLORIDE 0.9 % WEIGHT BASED INFUSION
3.0000 mL/kg/h | INTRAVENOUS | Status: DC
  Administered 2018-07-24 (×2): 250 mL via INTRAVENOUS

## 2018-07-24 MED ORDER — LABETALOL HCL 5 MG/ML IV SOLN: 10.0000 mg | INTRAVENOUS | Status: AC | PRN

## 2018-07-24 SURGICAL SUPPLY — 17 items
BALLN SAPPHIRE 2.0X12 (BALLOONS) ×2
BALLN SAPPHIRE ~~LOC~~ 2.5X12 (BALLOONS) ×1 IMPLANT
BALLOON SAPPHIRE 2.0X12 (BALLOONS) IMPLANT
CATH VISTA GUIDE 6FR XBLAD3.5 (CATHETERS) ×1 IMPLANT
COVER DOME SNAP 22 D (MISCELLANEOUS) ×1 IMPLANT
DEVICE RAD COMP TR BAND LRG (VASCULAR PRODUCTS) ×1 IMPLANT
ELECT DEFIB PAD ADLT CADENCE (PAD) ×2 IMPLANT
GLIDESHEATH SLEND SS 6F .021 (SHEATH) ×1 IMPLANT
GUIDEWIRE INQWIRE 1.5J.035X260 (WIRE) IMPLANT
INQWIRE 1.5J .035X260CM (WIRE) ×2
KIT ENCORE 26 ADVANTAGE (KITS) ×1 IMPLANT
KIT HEART LEFT (KITS) ×2 IMPLANT
PACK CARDIAC CATHETERIZATION (CUSTOM PROCEDURE TRAY) ×2 IMPLANT
STENT RESOLUTE ONYX 2.25X15 (Permanent Stent) ×1 IMPLANT
TRANSDUCER W/STOPCOCK (MISCELLANEOUS) ×2 IMPLANT
TUBING CIL FLEX 10 FLL-RA (TUBING) ×2 IMPLANT
WIRE ASAHI PROWATER 180CM (WIRE) ×1 IMPLANT

## 2018-07-24 NOTE — H&P (View-Only) (Signed)
Advanced Heart Failure Rounding Note   Subjective:    Admitted yesterday with inferior STEMI c/b RV involvement. Trop peaked at 24.   Cath 100%p RCA treated with DES. Chronic total occlusion of mLAD with R -> L collaterals . Ramus with 90% EF 35-40% by cath.   Echo with EF 40-45% Mild RV HK at worst  Feels good no CP.    Objective:   Weight Range:  Vital Signs:   Temp:  [97.7 F (36.5 C)-98.7 F (37.1 C)] 97.7 F (36.5 C) (05/01 0800) Pulse Rate:  [49-100] 91 (05/01 0845) Resp:  [10-29] 21 (05/01 0845) BP: (68-154)/(40-94) 103/69 (05/01 0830) SpO2:  [79 %-100 %] 91 % (05/01 0845) Weight:  [86.2 kg] 86.2 kg (04/30 1400) Last BM Date: 07/23/18(morning of 4/30 per pt)  Weight change: Filed Weights   07/23/18 1400  Weight: 86.2 kg    Intake/Output:   Intake/Output Summary (Last 24 hours) at 07/24/2018 1048 Last data filed at 07/24/2018 0800 Gross per 24 hour  Intake 2330.09 ml  Output 1475 ml  Net 855.09 ml     Physical Exam: General:  Well appearing. No resp difficulty HEENT: normal Neck: supple. JVP . Carotids 2+ bilat; no bruits. No lymphadenopathy or thryomegaly appreciated. Cor: PMI nondisplaced. Regular rate & rhythm. No rubs, gallops or murmurs. Lungs: clear Abdomen: soft, nontender, nondistended. No hepatosplenomegaly. No bruits or masses. Good bowel sounds. Extremities: no cyanosis, clubbing, rash, edema Neuro: alert & orientedx3, cranial nerves grossly intact. moves all 4 extremities w/o difficulty. Affect pleasant  Telemetry: NSR 70 Personally reviewed   Labs: Basic Metabolic Panel: Recent Labs  Lab 07/23/18 1234 07/23/18 1257 07/23/18 1315 07/23/18 1318 07/24/18 0103 07/24/18 0720  NA  --  136 136  138 137 139  --   K  --  4.2 4.0  4.0 4.2 4.1  --   CL  --  108  --   --  111  --   CO2  --  19*  --   --  19*  --   GLUCOSE  --  147*  --   --  109*  --   BUN  --  24*  --   --  16  --   CREATININE 0.70 1.20  --   --  1.20  --    CALCIUM  --  9.1  --   --  8.4*  --   MG  --   --   --   --   --  1.9    Liver Function Tests: Recent Labs  Lab 07/23/18 1257  AST 36  ALT 29  ALKPHOS 69  BILITOT 1.1  PROT 6.5  ALBUMIN 3.7   No results for input(s): LIPASE, AMYLASE in the last 168 hours. No results for input(s): AMMONIA in the last 168 hours.  CBC: Recent Labs  Lab 07/23/18 1257 07/23/18 1315 07/23/18 1318 07/23/18 1451 07/24/18 0551  WBC 5.0  --   --  6.2 6.1  HGB 12.4* 9.2*  9.5* 9.5* 11.4* 12.0*  HCT 33.9* 27.0*  28.0* 28.0* 32.3* 33.5*  MCV 103.4*  --   --  104.9* 105.7*  PLT 196  --   --  229 199    Cardiac Enzymes: Recent Labs  Lab 07/23/18 1257 07/23/18 1451 07/23/18 2031 07/24/18 0103  TROPONINI 0.72* 2.26* 16.55* 24.35*    BNP: BNP (last 3 results) No results for input(s): BNP in the last 8760 hours.  ProBNP (last 3 results)  No results for input(s): PROBNP in the last 8760 hours.    Other results:  Imaging:  No results found.   Medications:     Scheduled Medications: . aspirin EC  81 mg Oral Daily  . folic acid  1 mg Oral Daily  . heparin  5,000 Units Subcutaneous Q8H  . mercaptopurine  50 mg Oral Daily  . montelukast  10 mg Oral QHS  . pantoprazole  40 mg Oral Daily  . rosuvastatin  40 mg Oral Daily  . sodium chloride flush  3 mL Intravenous Q12H  . ticagrelor  90 mg Oral BID     Infusions: . sodium chloride Stopped (07/24/18 0747)  . norepinephrine (LEVOPHED) Adult infusion Stopped (07/24/18 0747)     PRN Medications:  sodium chloride, acetaminophen, loperamide, mometasone-formoterol, morphine injection, nitroGLYCERIN, ondansetron (ZOFRAN) IV, sodium chloride flush  Diagnostic  Dominance: Right    Intervention     Assessment:   Mr. Jakolby Sedivy is a 68 yo F with a hx of HTN, HLD and GERD who presented to [facility] with an inferior STEMI.     Plan/Discussion:    1. CAD with acute inferior STEMI & RV involvement - Cath as above with 3v-CAD.  Peak trop 24 S/p DES to RCA. Has CTO of LAD with R->L collats - Case d/w Dr. Ellyn Hack. For staged PCI of Ramus today - ECHO EF 40-45%. RV ok - Continue DAPT and high-potency statin. Start carvedilol 3.125 bid. Resume losartan after cath - Refer CR  2. HTN - BP ok. Meds as above  3. HL - on statin   Length of Stay: [place] 07/24/2018, 10:48 AM  Advanced Heart Failure Team Pager [phone] (M-F; 7a - 4p)  Please contact [facility] for night-coverage after hours (4p -7a ) and weekends on [email or web address]

## 2018-07-24 NOTE — Progress Notes (Signed)
Cardiac Rehab Advisory Cardiac Rehab Phase I is not seeing pts face to face at this time due to Covid 19 restrictions. Ambulation is occurring through nursing, PT, and mobility teams. We will help facilitate that process as needed. We are calling pts in their rooms and discussing education. We will then deliver education materials to pts RN for delivery to pt.   Brought pt MI educational materials. His RN was busy so I delivered them to him and discussed from >6 ft. Discussed briefly MI, stent, Brilinta, restrictions, diet, and CRPII. Pt receptive and appreciative of the information. He is eager to read materials and also has educational videos to watch. Made him aware of his A1C being in DM range and gave videos and information to read regarding eating carb modified. Pt interested in [place] and will refer to [place]. I will f/u tomorrow to finish education. He sat in recliner earlier and can walk with his RN later. [place], ACSM 11:38 AM 07/24/2018

## 2018-07-24 NOTE — Interval H&P Note (Signed)
Cath Lab Visit (complete for each Cath Lab visit)  Clinical Evaluation Leading to the Procedure:   ACS: Yes.    Non-ACS:    Anginal Classification: CCS III  Anti-ischemic medical therapy: Minimal Therapy (1 class of medications)  Non-Invasive Test Results: No non-invasive testing performed  Prior CABG: No previous CABG      History and Physical Interval Note:  07/24/2018 2:54 PM  Darryl [name]  has presented today for surgery, with the diagnosis of coronany artery disease.  The various methods of treatment have been discussed with the patient and family. After consideration of risks, benefits and other options for treatment, the patient has consented to  Procedure(s): CORONARY STENT INTERVENTION (N/A) as a surgical intervention.  The patient's history has been reviewed, patient examined, no change in status, stable for surgery.  I have reviewed the patient's chart and labs.  Questions were answered to the patient's satisfaction.     Shelva Majestic

## 2018-07-24 NOTE — Progress Notes (Signed)
Pt off unit to cath lab at this time.

## 2018-07-24 NOTE — Progress Notes (Signed)
Advanced Heart Failure Rounding Note   Subjective:    Admitted yesterday with inferior STEMI c/b RV involvement. Trop peaked at 24.   Cath 100%p RCA treated with DES. Chronic total occlusion of mLAD with R -> L collaterals . Ramus with 90% EF 35-40% by cath.   Echo with EF 40-45% Mild RV HK at worst  Feels good no CP.    Objective:   Weight Range:  Vital Signs:   Temp:  [97.7 F (36.5 C)-98.7 F (37.1 C)] 97.7 F (36.5 C) (05/01 0800) Pulse Rate:  [49-100] 91 (05/01 0845) Resp:  [10-29] 21 (05/01 0845) BP: (68-154)/(40-94) 103/69 (05/01 0830) SpO2:  [79 %-100 %] 91 % (05/01 0845) Weight:  [86.2 kg] 86.2 kg (04/30 1400) Last BM Date: 07/23/18(morning of 4/30 per pt)  Weight change: Filed Weights   07/23/18 1400  Weight: 86.2 kg    Intake/Output:   Intake/Output Summary (Last 24 hours) at 07/24/2018 1048 Last data filed at 07/24/2018 0800 Gross per 24 hour  Intake 2330.09 ml  Output 1475 ml  Net 855.09 ml     Physical Exam: General:  Well appearing. No resp difficulty HEENT: normal Neck: supple. JVP . Carotids 2+ bilat; no bruits. No lymphadenopathy or thryomegaly appreciated. Cor: PMI nondisplaced. Regular rate & rhythm. No rubs, gallops or murmurs. Lungs: clear Abdomen: soft, nontender, nondistended. No hepatosplenomegaly. No bruits or masses. Good bowel sounds. Extremities: no cyanosis, clubbing, rash, edema Neuro: alert & orientedx3, cranial nerves grossly intact. moves all 4 extremities w/o difficulty. Affect pleasant  Telemetry: NSR 70 Personally reviewed   Labs: Basic Metabolic Panel: Recent Labs  Lab 07/23/18 1234 07/23/18 1257 07/23/18 1315 07/23/18 1318 07/24/18 0103 07/24/18 0720  NA  --  136 136  138 137 139  --   K  --  4.2 4.0  4.0 4.2 4.1  --   CL  --  108  --   --  111  --   CO2  --  19*  --   --  19*  --   GLUCOSE  --  147*  --   --  109*  --   BUN  --  24*  --   --  16  --   CREATININE 0.70 1.20  --   --  1.20  --    CALCIUM  --  9.1  --   --  8.4*  --   MG  --   --   --   --   --  1.9    Liver Function Tests: Recent Labs  Lab 07/23/18 1257  AST 36  ALT 29  ALKPHOS 69  BILITOT 1.1  PROT 6.5  ALBUMIN 3.7   No results for input(s): LIPASE, AMYLASE in the last 168 hours. No results for input(s): AMMONIA in the last 168 hours.  CBC: Recent Labs  Lab 07/23/18 1257 07/23/18 1315 07/23/18 1318 07/23/18 1451 07/24/18 0551  WBC 5.0  --   --  6.2 6.1  HGB 12.4* 9.2*  9.5* 9.5* 11.4* 12.0*  HCT 33.9* 27.0*  28.0* 28.0* 32.3* 33.5*  MCV 103.4*  --   --  104.9* 105.7*  PLT 196  --   --  229 199    Cardiac Enzymes: Recent Labs  Lab 07/23/18 1257 07/23/18 1451 07/23/18 2031 07/24/18 0103  TROPONINI 0.72* 2.26* 16.55* 24.35*    BNP: BNP (last 3 results) No results for input(s): BNP in the last 8760 hours.  ProBNP (last 3 results)  No results for input(s): PROBNP in the last 8760 hours.    Other results:  Imaging:  No results found.   Medications:     Scheduled Medications: . aspirin EC  81 mg Oral Daily  . folic acid  1 mg Oral Daily  . heparin  5,000 Units Subcutaneous Q8H  . mercaptopurine  50 mg Oral Daily  . montelukast  10 mg Oral QHS  . pantoprazole  40 mg Oral Daily  . rosuvastatin  40 mg Oral Daily  . sodium chloride flush  3 mL Intravenous Q12H  . ticagrelor  90 mg Oral BID     Infusions: . sodium chloride Stopped (07/24/18 0747)  . norepinephrine (LEVOPHED) Adult infusion Stopped (07/24/18 0747)     PRN Medications:  sodium chloride, acetaminophen, loperamide, mometasone-formoterol, morphine injection, nitroGLYCERIN, ondansetron (ZOFRAN) IV, sodium chloride flush  Diagnostic  Dominance: Right    Intervention     Assessment:   Mr. Khary Schaben is a 68 yo F with a hx of HTN, HLD and GERD who presented to [facility] with an inferior STEMI.     Plan/Discussion:    1. CAD with acute inferior STEMI & RV involvement - Cath as above with 3v-CAD.  Peak trop 24 S/p DES to RCA. Has CTO of LAD with R->L collats - Case d/w Dr. Ellyn Hack. For staged PCI of Ramus today - ECHO EF 40-45%. RV ok - Continue DAPT and high-potency statin. Start carvedilol 3.125 bid. Resume losartan after cath - Refer CR  2. HTN - BP ok. Meds as above  3. HL - on statin   Length of Stay: [place] 07/24/2018, 10:48 AM  Advanced Heart Failure Team Pager [phone] (M-F; 7a - 4p)  Please contact [facility] for night-coverage after hours (4p -7a ) and weekends on [email or web address]

## 2018-07-24 NOTE — Progress Notes (Signed)
Right femoral venous sheath removed at 0350. Catheter intact upon removal. Manual pressure held at site for 10 minutes. BP cycled q5 mins. VS stable and femoral site remained Level 0 throughout. Patient educated throughout and tolerated procedure well.  Nelva Bush RN

## 2018-07-25 LAB — CBC
HCT: 27.6 % — ABNORMAL LOW (ref 39.0–52.0)
Hemoglobin: 9.8 g/dL — ABNORMAL LOW (ref 13.0–17.0)
MCH: 37.5 pg — ABNORMAL HIGH (ref 26.0–34.0)
MCHC: 35.5 g/dL (ref 30.0–36.0)
MCV: 105.7 fL — ABNORMAL HIGH (ref 80.0–100.0)
Platelets: 162 10*3/uL (ref 150–400)
RBC: 2.61 MIL/uL — ABNORMAL LOW (ref 4.22–5.81)
RDW: 15 % (ref 11.5–15.5)
WBC: 4.1 10*3/uL (ref 4.0–10.5)
nRBC: 0 % (ref 0.0–0.2)

## 2018-07-25 LAB — BASIC METABOLIC PANEL
Anion gap: 5 (ref 5–15)
BUN: 16 mg/dL (ref 8–23)
CO2: 23 mmol/L (ref 22–32)
Calcium: 8.2 mg/dL — ABNORMAL LOW (ref 8.9–10.3)
Chloride: 112 mmol/L — ABNORMAL HIGH (ref 98–111)
Creatinine, Ser: 1.11 mg/dL (ref 0.61–1.24)
GFR calc Af Amer: >60 mL/min (ref >60)
GFR calc non Af Amer: >60 mL/min (ref >60)
Glucose, Bld: 104 mg/dL — ABNORMAL HIGH (ref 70–99)
Potassium: 4.2 mmol/L (ref 3.5–5.1)
Sodium: 140 mmol/L (ref 135–145)

## 2018-07-25 LAB — MAGNESIUM: Magnesium: 1.9 mg/dL (ref 1.7–2.4)

## 2018-07-25 MED ORDER — TICAGRELOR 90 MG PO TABS: 90.0000 mg | ORAL_TABLET | Freq: Two times a day (BID) | ORAL | 0 refills | Status: DC

## 2018-07-25 MED ORDER — TICAGRELOR 90 MG PO TABS: 90.0000 mg | ORAL_TABLET | Freq: Two times a day (BID) | ORAL | 11 refills | Status: DC

## 2018-07-25 MED ORDER — MAGNESIUM SULFATE 2 GM/50ML IV SOLN
2.0000 g | Freq: Once | INTRAVENOUS | Status: AC
  Administered 2018-07-25: 2 g via INTRAVENOUS
  Filled 2018-07-25: qty 50

## 2018-07-25 MED ORDER — CARVEDILOL 3.125 MG PO TABS: 3.1250 mg | ORAL_TABLET | Freq: Two times a day (BID) | ORAL | 11 refills | Status: DC

## 2018-07-25 MED ORDER — ROSUVASTATIN CALCIUM 40 MG PO TABS: 40.0000 mg | ORAL_TABLET | Freq: Every day | ORAL | 3 refills | Status: DC

## 2018-07-25 MED ORDER — NITROGLYCERIN 0.4 MG SL SUBL: 0.4000 mg | SUBLINGUAL_TABLET | SUBLINGUAL | 3 refills | Status: DC | PRN

## 2018-07-25 MED ORDER — ASPIRIN 81 MG PO TBEC: 81.0000 mg | DELAYED_RELEASE_TABLET | Freq: Every day | ORAL | Status: DC

## 2018-07-25 NOTE — Discharge Summary (Addendum)
Discharge Summary    Patient ID: Darryl [name] MRN: [id number]; DOB: October 05, 1950  Admit date: 07/23/2018 Discharge date: 07/25/2018  Primary Care Provider: Janith Lima, MD Primary Cardiologist: Darryl Hew, MD  Primary Electrophysiologist:  None   Discharge Diagnoses    Principal Problem:   Acute ST elevation myocardial infarction (STEMI) of inferior wall [facility]) Active Problems:   Prediabetes   Essential hypertension, benign   Hyperlipidemia with target LDL less than 130   STEMI (ST elevation myocardial infarction) [facility])   Coronary artery disease involving coronary bypass graft of native heart with unstable angina pectoris ([place])   Coronary artery disease involving native coronary artery of native heart with unstable angina pectoris (H[facility])   Allergies Allergies  Allergen Reactions  . Asacol [Mesalamine] Hives    Trouble swallowing, loss of appetite  . Penicillins     RASH   . Amoxicillin Rash  . Sulfamethoxazole Rash    Diagnostic Studies/Procedures    Cath 07/23/2018  CULPRIT LESION: Prox RCA to Mid RCA lesion is 100% stenosed.  A drug-eluting stent was successfully placed using a STENT RESOLUTE ONYX 3.5X26. Deployed to 3.6-3.7 mm  Post intervention, there is a 0% residual stenosis.  Dist RCA lesion is 50% stenosed with 40% stenosed side branch in Post Atrio likely related to downstream spasm (not present after initial balloon inflation.  ---------------------------------------  Ost 1st Diag lesion is 60% stenosed.  Prox LAD lesion is 100% stenosed after 1st Diag & 1st Septal. -- likely CTO (R-L collaterals);  Ost Ramus to Ramus lesion is 85-90% stenosed. -- Would consider Staged PCI prior to discharge.  ----------------------------------------  There is mild to moderate left ventricular systolic dysfunction. The left ventricular ejection fraction is 35-45% by visual estimate.  LV end diastolic pressure is normal. And right heart cath pressures are  essentially normal.  Hyperdynamic cardiac output on vasopressors. Indicates stable mild cardiogenic shock   SUMMARY  Severe three-vessel CAD:  Culprit lesion 100% occluded proximal to mid large dominant RCA (successful DES PCI using Resolute Onyx 3.5 mm x 26 mm--3.6 mm)  100% likely CTO of early proximal-mid LAD (after D1 and SP1) with right to left collaterals --> will need to assess for anterior wall viability once convalescence from MI is complete  85 to 90% proximal large branching Ramus Intermedius  Mildly reduced LVEF with apparent anterior and inferior hypokinesis.  Initial RV infarct related to CARDIOGENIC SHOCK with transient significant bradycardia requiring initial atropine and dopamine followed by Levophed, now stabilized.  Normal right heart cath pressures with stable cardiac output post PCI and on pressors.    Echo 07/23/2018 1. Severe akinesis of the left ventricular, mid anteroseptal wall.  2. The left ventricle has mild-moderately reduced systolic function, with an ejection fraction of 40-45%. The cavity size was normal. Left ventricular diastolic Doppler parameters are consistent with impaired relaxation.  3. Left atrial size was not assessed.  4. The mitral valve is grossly normal.  5. No stenosis of the aortic valve.  6. The interatrial septum was not assessed.   Cath 07/24/2018  Ost 1st Diag lesion is 60% stenosed.  Prox LAD lesion is 100% stenosed.  Previously placed Prox RCA to Mid RCA drug eluting stent is widely patent.  Balloon angioplasty was performed.  Dist RCA lesion is 50% stenosed with 40% stenosed side branch in Post Atrio.  Ost Ramus to Ramus lesion is 90% stenosed.  Post intervention, there is a 0% residual stenosis.  A stent was successfully placed.  Successful staged PCI to a 90% proximal ramus intermediate stenosis with ultimate insertion of a 2.25 x 15 mm Resolute Onyx DES stent postdilated to 2.5 mm with the 90% stenosis being  reduced to 0%.  Please refer to Dr. Allison [name] initial catheterization note for his description of his additional concomitant CAD and acute stenting to the totally occluded RCA yesterday.  RECOMMENDATION: DAPT for minimum of 1 year.  Gradual titration of medical therapy for concomitant CAD as blood pressure allows.  High potency statin therapy with target LDL less than 70. _____________   History of Present Illness     Darryl [name] is a 68 yo M with a hx as stated above who presented after having several days of having chest pain in the mornings but always resolved.  He also noted having some exertional chest pain while playing golf 2 or 3 days ago.  Each time the chest pain resolved spontaneously and he felt it was more consistent with GI medication.  Pt reported that today the pain became more intense and worrisome and he therefore called his GI doctor thinking this related to his GERD. He was then instructed to call EMS for transport to the ED for further evaluation. He has no prior personal hx of CAD and a family hx of CAD in his father. He denies tobacco, alcohol and illicit drug use.   On EMS evaluation, the pain actually had resolved, but he still had EKG changes consistent with inferior ST elevations in II, 3 and aVF of roughly 2 mm with some reciprocal changes).  Code STEMI was called, and he was brought to [facility].  He did have another recurrence of the chest discomfort once he was placed in the EMS truck, but this was resolved by time he got to the ER.  Was chest pain-free upon arrival to the Cath Lab.  On ED arrival (11:52 AM), EKG was performed which showed and an acute inferior STEMI. Pt was brought to the cath lab for assessment and possible intervention. Labs performed in the cath lab which showed a creatinine of 1.24 and otherwise normal lab results.   Hospital Course     Consultants: N/A   Patient was admitted by cardiology service.  Urgent cardiac catheterization  performed on 07/23/2018 revealed 100% proximal to mid RCA occlusion treated with a 3.5 x 26 mm resolute Onyx DES, 50% distal RCA lesion, 60% ostial D1 lesion, 100% proximal LAD occlusion after the first diagonal with right-to-left collaterals likely suggestive of CTO, 85 to 90% ostial ramus lesion, EF by LV gram was 35 to 45%.  Patient had cardiogenic shock related to initial RV infarct with transient significant bradycardia requiring initiation of atropine and dopamine followed by Levophed.  Post cardiac catheterization, his overall condition stabilized.  He was brought back to the Cath Lab the following day on 5/1 and underwent successful staged PCI of ramus lesion with a 2.25 x 15 mm resolute DES.  Postprocedure, he was placed on aspirin and Brilinta.  Initial echocardiogram obtained on 4/30 showed EF 40 to 45%, severe akinesis of the mid anteroseptal wall.  Serial troponin trended up to 24.35.  Hemoglobin A1c was 6.5. Patient was seen in the morning of 07/25/2018 at which point, he is feeling very well from cardiology perspective.  He is deemed stable for discharge.  Carvedilol was started today prior.  He was also placed on high intensity statin.  Prior to discharge, his hemoglobin level did drop slightly however this is relatively  consistent with admission lab.  He will be a [employer] video conference telehealth visit.   _____________  Discharge Vitals Blood pressure 111/67, pulse (!) 56, temperature 97.7 F (36.5 C), temperature source Oral, resp. rate (!) 21, height 5' 6"  (1.676 m), weight 86.2 kg, SpO2 100 %.  Filed Weights   07/23/18 1400  Weight: 86.2 kg    Labs & Radiologic Studies    CBC Recent Labs    07/24/18 0551 07/25/18 0242  WBC 6.1 4.1  HGB 12.0* 9.8*  HCT 33.5* 27.6*  MCV 105.7* 105.7*  PLT 199 664   Basic Metabolic Panel Recent Labs    07/24/18 0103 07/24/18 0720 07/25/18 0242  NA 139  --  140  K 4.1  --  4.2  CL 111  --  112*  CO2 19*  --  23  GLUCOSE 109*  --   104*  BUN 16  --  16  CREATININE 1.20  --  1.11  CALCIUM 8.4*  --  8.2*  MG  --  1.9 1.9   Liver Function Tests Recent Labs    07/23/18 1257  AST 36  ALT 29  ALKPHOS 69  BILITOT 1.1  PROT 6.5  ALBUMIN 3.7   No results for input(s): LIPASE, AMYLASE in the last 72 hours. Cardiac Enzymes Recent Labs    07/23/18 1451 07/23/18 2031 07/24/18 0103  TROPONINI 2.26* 16.55* 24.35*   BNP Invalid input(s): POCBNP D-Dimer No results for input(s): DDIMER in the last 72 hours. Hemoglobin A1C Recent Labs    07/24/18 0551  HGBA1C 6.5*   Fasting Lipid Panel Recent Labs    07/24/18 0103  CHOL 117  HDL 64  LDLCALC 39  TRIG 72  CHOLHDL 1.8   Thyroid Function Tests No results for input(s): TSH, T4TOTAL, T3FREE, THYROIDAB in the last 72 hours.  Invalid input(s): FREET3 _____________  No results found. Disposition   Pt is being discharged home today in good condition.  Follow-up Plans & Appointments    Follow-up Information    [name], MD Follow up.   Specialty:  Cardiology Why:  office scheduler will contact you to arrange 1 week transition of care visit. Please give [place] a call if you do not hear from our scheduler in 3 business days.  Contact information: [address] [place] [address] [phone]        Darryl Lima, MD. Schedule an appointment as soon as possible for a visit.   Specialty:  Internal Medicine Contact information: [address] [address] [phone]          Discharge Instructions    Amb Referral to Cardiac Rehabilitation   Complete by:  As directed    Diagnosis:   Coronary Stents PTCA STEMI     Diet - low sodium heart healthy   Complete by:  As directed    Discharge instructions   Complete by:  As directed    No driving for 24 hours. No lifting over 5 lbs for 1 week. No sexual activity for 1 week. Keep procedure site clean & dry. If you notice increased pain, swelling,  bleeding or pus, call/return!  You may shower, but no soaking baths/hot tubs/pools for 1 week.   Increase activity slowly   Complete by:  As directed       Discharge Medications   Allergies as of 07/25/2018      Reactions   Asacol [mesalamine] Hives   Trouble swallowing,  loss of appetite   Penicillins    RASH    Amoxicillin Rash   Sulfamethoxazole Rash      Medication List    TAKE these medications   allopurinol 100 MG tablet Commonly known as:  ZYLOPRIM Take 1 tablet (100 mg total) by mouth daily.   aspirin 81 MG EC tablet Take 1 tablet (81 mg total) by mouth daily. Start taking on:  Jul 26, 2018   Budesonide ER 9 MG Tb24 Commonly known as:  Uceris Take 1 tablet by mouth daily.   carvedilol 3.125 MG tablet Commonly known as:  COREG Take 1 tablet (3.125 mg total) by mouth 2 (two) times daily with a meal.   CENTRUM SILVER PO Take 1 tablet by mouth daily.   Dialyvite Omega-3 Concentrate 600 MG Caps Take 1 tablet by mouth 2 (two) times daily.   fluticasone-salmeterol 115-21 MCG/ACT inhaler Commonly known as:  Advair HFA USE 2 PUFFS TWICE DAILY AS NEEDED. SHAKE WELL   folic acid 1 MG tablet Commonly known as:  FOLVITE TAKE 1 TABLET ONCE DAILY.   hydrocortisone 2.5 % cream   loperamide 2 MG tablet Commonly known as:  IMODIUM A-D Take 2 mg by mouth 4 (four) times daily as needed for diarrhea or loose stools.   mercaptopurine 50 MG tablet Commonly known as:  PURINETHOL Take 1 tablet (50 mg total) by mouth daily.   montelukast 10 MG tablet Commonly known as:  SINGULAIR TAKE ONE TABLET AT BEDTIME.   nitroGLYCERIN 0.4 MG SL tablet Commonly known as:  NITROSTAT Place 1 tablet (0.4 mg total) under the tongue every 5 (five) minutes x 3 doses as needed for chest pain.   pantoprazole 40 MG tablet Commonly known as:  PROTONIX TAKE 1 TABLET ONCE DAILY.   rosuvastatin 40 MG tablet Commonly known as:  Crestor Take 1 tablet (40 mg total) by mouth daily. What  changed:    medication strength  how much to take   telmisartan 40 MG tablet Commonly known as:  MICARDIS TAKE 1 TABLET ONCE DAILY.   ticagrelor 90 MG Tabs tablet Commonly known as:  BRILINTA Take 1 tablet (90 mg total) by mouth 2 (two) times daily.   traMADol 50 MG tablet Commonly known as:  ULTRAM TAKE ONE TABLET EVERY 6 HOURS AS NEEDED. What changed:  See the new instructions.   Ventolin HFA 108 (90 Base) MCG/ACT inhaler Generic drug:  albuterol USE 2 PUFFS EVERY 6 HOURS AS NEEDED FOR WHEEZING. **SHAKE WELL** What changed:  See the new instructions.   vitamin B-12 1000 MCG tablet Commonly known as:  CYANOCOBALAMIN Take 1 tablet (1,000 mcg total) by mouth daily.   Vitamin D3 25 MCG (1000 UT) Caps Take 1 capsule by mouth daily.        Acute coronary syndrome (MI, NSTEMI, STEMI, etc) this admission?: Yes.     AHA/ACC Clinical Performance & Quality Measures: 1. Aspirin prescribed? - Yes 2. ADP Receptor Inhibitor (Plavix/Clopidogrel, Brilinta/Ticagrelor or Effient/Prasugrel) prescribed (includes medically managed patients)? - Yes 3. Beta Blocker prescribed? - Yes 4. High Intensity Statin (Lipitor 40-30m or Crestor 20-424m prescribed? - Yes 5. EF assessed during THIS hospitalization? - Yes 6. For EF <40%, was ACEI/ARB prescribed? - Not Applicable (EF >/= 4076%7. For EF <40%, Aldosterone Antagonist (Spironolactone or Eplerenone) prescribed? - Not Applicable (EF >/= 4019%8. Cardiac Rehab Phase II ordered (Included Medically managed Patients)? - Yes     Outstanding Labs/Studies   Repeat CBC as outpatient   Duration  of Discharge Encounter   Greater than 30 minutes including physician time.  Hilbert Corrigan, PA 07/25/2018, 3:32 PM   Patient seen and examined with the above-signed Advanced Practice Provider and/or Housestaff. I personally reviewed laboratory data, imaging studies and relevant notes. I independently examined the patient and formulated the important  aspects of the plan. I have edited the note to reflect any of my changes or salient points. I have personally discussed the plan with the patient and/or family.  He is stable post-PCI. See my note for other details.   Glori Bickers, MD  1:56 PM

## 2018-07-25 NOTE — Progress Notes (Signed)
Cardiac Rehab Advisory Cardiac Rehab Phase I is not seeing pts face to face at this time due to Covid 19 restrictions. Ambulation is occurring through nursing, PT, and mobility teams. We will help facilitate that process as needed. We are calling pts in their rooms and discussing education. We will then deliver education materials to pts RN for delivery to pt.   Spoke with pt by phone. He is feeling well and RN is planning to walk with him soon. Pt read materials yesterday and has a good understanding of recovery and diet. Today we discussed exercising at home and NTG. Good reception, he is eager to get going and interested in [place]. He understands the importance of Brilinta and has the copay card. [id number] Yves Dill CES, ACSM 10:36 AM 07/25/2018

## 2018-07-25 NOTE — Care Management (Signed)
Pt has Brilinta card.  Advised of copay.  Pt states he has no issues obtaining medicine.

## 2018-07-25 NOTE — Progress Notes (Signed)
Advanced Heart Failure Rounding Note   Subjective:    Admitted 4/310 with inferior STEMI c/b RV involvement. Trop peaked at 24.   Cath 100%p RCA treated with DES. Chronic total occlusion of mLAD with R -> L collaterals . Ramus with 90% EF 35-40% by cath.   Echo with EF 40-45% Mild RV HK at worst  Underwent staged PCI of ramus intermedius yesterday. Feels good. No CP or SOB. No bleeding. Hgb down slightly but relatively unchanged from admit    Objective:   Weight Range:  Vital Signs:   Temp:  [97.7 F (36.5 C)-98.7 F (37.1 C)] 98.7 F (37.1 C) (05/02 0400) Pulse Rate:  [58-91] 63 (05/02 0600) Resp:  [0-26] 12 (05/02 0600) BP: (80-120)/(52-88) 97/70 (05/02 0600) SpO2:  [91 %-100 %] 97 % (05/02 0600) Last BM Date: 07/23/18  Weight change: Filed Weights   07/23/18 1400  Weight: 86.2 kg    Intake/Output:   Intake/Output Summary (Last 24 hours) at 07/25/2018 0815 Last data filed at 07/25/2018 0500 Gross per 24 hour  Intake 1408.2 ml  Output 500 ml  Net 908.2 ml     Physical Exam: General:  Well appearing. No resp difficulty HEENT: normal Neck: supple. no JVD. Carotids 2+ bilat; no bruits. No lymphadenopathy or thryomegaly appreciated. Cor: PMI nondisplaced. Regular rate & rhythm. No rubs, gallops or murmurs. Lungs: clear Abdomen: soft, nontender, nondistended. No hepatosplenomegaly. No bruits or masses. Good bowel sounds. Extremities: no cyanosis, clubbing, rash, edema Neuro: alert & orientedx3, cranial nerves grossly intact. moves all 4 extremities w/o difficulty. Affect pleasant   Telemetry: NSR 70s Personally reviewed   Labs: Basic Metabolic Panel: Recent Labs  Lab 07/23/18 1234 07/23/18 1257 07/23/18 1315 07/23/18 1318 07/24/18 0103 07/24/18 0720 07/25/18 0242  NA  --  136 136  138 137 139  --  140  K  --  4.2 4.0  4.0 4.2 4.1  --  4.2  CL  --  108  --   --  111  --  112*  CO2  --  19*  --   --  19*  --  23  GLUCOSE  --  147*  --   --  109*   --  104*  BUN  --  24*  --   --  16  --  16  CREATININE 0.70 1.20  --   --  1.20  --  1.11  CALCIUM  --  9.1  --   --  8.4*  --  8.2*  MG  --   --   --   --   --  1.9 1.9    Liver Function Tests: Recent Labs  Lab 07/23/18 1257  AST 36  ALT 29  ALKPHOS 69  BILITOT 1.1  PROT 6.5  ALBUMIN 3.7   No results for input(s): LIPASE, AMYLASE in the last 168 hours. No results for input(s): AMMONIA in the last 168 hours.  CBC: Recent Labs  Lab 07/23/18 1257 07/23/18 1315 07/23/18 1318 07/23/18 1451 07/24/18 0551 07/25/18 0242  WBC 5.0  --   --  6.2 6.1 4.1  HGB 12.4* 9.2*  9.5* 9.5* 11.4* 12.0* 9.8*  HCT 33.9* 27.0*  28.0* 28.0* 32.3* 33.5* 27.6*  MCV 103.4*  --   --  104.9* 105.7* 105.7*  PLT 196  --   --  229 199 162    Cardiac Enzymes: Recent Labs  Lab 07/23/18 1257 07/23/18 1451 07/23/18 2031 07/24/18 0103  TROPONINI 0.72*  2.26* 16.55* 24.35*    BNP: BNP (last 3 results) No results for input(s): BNP in the last 8760 hours.  ProBNP (last 3 results) No results for input(s): PROBNP in the last 8760 hours.    Other results:  Imaging: No results found.   Medications:     Scheduled Medications: . aspirin EC  81 mg Oral Daily  . carvedilol  3.125 mg Oral BID WC  . folic acid  1 mg Oral Daily  . heparin  5,000 Units Subcutaneous Q8H  . mercaptopurine  50 mg Oral Daily  . montelukast  10 mg Oral QHS  . pantoprazole  40 mg Oral Daily  . rosuvastatin  40 mg Oral Daily  . sodium chloride flush  3 mL Intravenous Q12H  . sodium chloride flush  3 mL Intravenous Q12H  . ticagrelor  90 mg Oral BID    Infusions: . sodium chloride 86.2 mL/hr at 07/24/18 1255  . sodium chloride    . sodium chloride 80 mL/hr at 07/25/18 0614  . norepinephrine (LEVOPHED) Adult infusion Stopped (07/24/18 0747)    PRN Medications: sodium chloride, sodium chloride, acetaminophen, diazepam, loperamide, mometasone-formoterol, morphine injection, nitroGLYCERIN, ondansetron  (ZOFRAN) IV, sodium chloride flush, sodium chloride flush, zolpidem  Diagnostic  Dominance: Right    Intervention     Assessment:   Darryl [name] is a 68 yo F with a hx of HTN, HLD and GERD who presented to [facility] with an inferior STEMI.     Plan/Discussion:    1. CAD with acute inferior STEMI & RV involvement - Cath as above with 3v-CAD. Peak trop 24 S/p DES to RCA. Has CTO of LAD with R->L collats - Underwent staged PCI of Ramus 5/1 - ECHO EF 40-45%. RV ok - Continue DAPT and high-potency statin. Carvedilol started yesterday  - Refer CR  2. HTN - BP ok. Meds asbelow  3. HL - on statin  4. Hypomag - give 2g today  5. Macrocytic anemia - will need outpatient f/u  [facility] for d/c today on    ECASA 81 Brilinta 90 bid Carvedilol 3.125 bid  Crestor 40 Micardis 40  Lasix 27m prn only for edema   Length of Stay: 2   Darryl BickersMD 07/25/2018, 8:15 AM  Advanced Heart Failure Team Pager [phone](M-F; 7a - 4p)  Please contact [facility] for night-coverage after hours (4p -7a ) and weekends on [email or web address]

## 2018-07-27 ENCOUNTER — Encounter (HOSPITAL_COMMUNITY): Payer: Self-pay | Admitting: Cardiovascular Disease

## 2018-07-27 ENCOUNTER — Telehealth: Payer: Self-pay | Admitting: *Deleted

## 2018-07-27 NOTE — Telephone Encounter (Signed)
Pt was on TCM report admitted 07/23/18 for chest pain. On 5/1 pt underwent successful staged PCI of ramus lesion with a 2.25 x 15 mm resolute DES.  Postprocedure, he was placed on aspirin and Brilinta.  Initial echocardiogram obtained on 4/30 showed EF 40 to 45%, severe akinesis of the mid anteroseptal wall. Pt was D/C 07/25/18, and will foloow-up w/cardiology in 1 week.Marland KitchenJohny Chess

## 2018-07-27 NOTE — Progress Notes (Signed)
OK thanks. [place]

## 2018-07-28 ENCOUNTER — Other Ambulatory Visit (INDEPENDENT_AMBULATORY_CARE_PROVIDER_SITE_OTHER): Payer: Medicare Other

## 2018-07-28 DIAGNOSIS — K5090 Crohn's disease, unspecified, without complications: Secondary | ICD-10-CM | POA: Diagnosis not present

## 2018-07-28 LAB — COMPREHENSIVE METABOLIC PANEL
ALT: 28 U/L (ref 0–53)
AST: 28 U/L (ref 0–37)
Albumin: 4.1 g/dL (ref 3.5–5.2)
Alkaline Phosphatase: 67 U/L (ref 39–117)
BUN: 22 mg/dL (ref 6–23)
CO2: 23 mEq/L (ref 19–32)
Calcium: 8.7 mg/dL (ref 8.4–10.5)
Chloride: 104 mEq/L (ref 96–112)
Creatinine, Ser: 1.29 mg/dL (ref 0.40–1.50)
GFR: 55.22 mL/min — ABNORMAL LOW (ref >60.00)
Glucose, Bld: 119 mg/dL — ABNORMAL HIGH (ref 70–99)
Potassium: 4.2 mEq/L (ref 3.5–5.1)
Sodium: 136 mEq/L (ref 135–145)
Total Bilirubin: 1 mg/dL (ref 0.2–1.2)
Total Protein: 6.4 g/dL (ref 6.0–8.3)

## 2018-07-29 MED FILL — Nitroglycerin IV Soln 100 MCG/ML in D5W: INTRA_ARTERIAL | Qty: 10 | Status: AC

## 2018-07-29 MED FILL — Atropine Sulfate Soln Prefill Syr 1 MG/10ML (0.1 MG/ML): INTRAMUSCULAR | Qty: 10 | Status: AC

## 2018-07-29 MED FILL — Heparin Sod (Porcine)-NaCl IV Soln 1000 Unit/500ML-0.9%: INTRAVENOUS | Qty: 1000 | Status: AC

## 2018-07-29 MED FILL — Verapamil HCl IV Soln 2.5 MG/ML: INTRAVENOUS | Qty: 2 | Status: AC

## 2018-07-29 MED FILL — Ondansetron HCl Inj 4 MG/2ML (2 MG/ML): INTRAMUSCULAR | Qty: 2 | Status: AC

## 2018-07-29 MED FILL — Perflutren Lipid Microsphere IV Susp 1.1 MG/ML: INTRAVENOUS | Qty: 10 | Status: AC

## 2018-08-04 LAB — THIOPURINE METABOLITES
6 MMP(6-Methylmercaptopurine): <500 pmol/8x10(8)RBC (ref <5700)
6 TG(6-Thioguanine): 164 pmol/8x10(8)RBC — ABNORMAL LOW (ref 235–400)

## 2018-08-05 ENCOUNTER — Telehealth: Payer: Self-pay | Admitting: Internal Medicine

## 2018-08-05 DIAGNOSIS — K5090 Crohn's disease, unspecified, without complications: Secondary | ICD-10-CM

## 2018-08-05 DIAGNOSIS — R7401 Elevation of levels of liver transaminase levels: Secondary | ICD-10-CM

## 2018-08-05 NOTE — Telephone Encounter (Signed)
Patient is returning your call in regards to his labs

## 2018-08-05 NOTE — Telephone Encounter (Signed)
See thiopurine metabolite 07/28/18 result note for further information related to this.

## 2018-08-09 NOTE — Progress Notes (Signed)
Virtual Visit via Video Note   This visit type was conducted due to national recommendations for restrictions regarding the COVID-19 Pandemic (e.g. social distancing) in an effort to limit this patient's exposure and mitigate transmission in our community.  Due to his co-morbid illnesses, this patient is at least at moderate risk for complications without adequate follow up.  This format is felt to be most appropriate for this patient at this time.  All issues noted in this document were discussed and addressed.  A limited physical exam was performed with this format.  Please refer to the patient's chart for his consent to telehealth for [facility]   Started video visit but patients' microphone was not working. Changed to phone visit.   Date:  08/09/2018   ID:  Darryl [name], DOB January 20, 1951, MRN [id number]  Patient Location: Home Provider Location: Home  PCP:  Janith Lima, MD  Cardiologist:  Glenetta Hew, MD  Electrophysiologist:  None   Evaluation Performed:  Follow-Up Visit  Chief Complaint:  TOC Follow up Acute STEMI   History of Present Illness:    Darryl [name] is a 68 y.o. male post hospitalization with TOC follow-up after admission for acute STEMI of the inferior wall, with other history to include hypertension, hyperlipidemia, prediabetes.  The patient was admitted on 07/23/2018, in the setting of acute ST elevation MI.  Urgent cardiac catheterization was performed revealing 100% proximal to mid RCA occlusion which was treated by 3.5 x 26 mm resolute Onyx drug-eluting stent, patient was also found to have a 50% distal RCA lesion, 60% ostial diagonal 1 lesion, 100% proximal LAD occlusion after the first diagonal with right to left collaterals likely suggestive of CTO, 85% to 90% ostial ramus lesion.  The patient's EF by LV gram was 35% to 45%.  He did have cardiogenic shock related to RV infarct with transient significant bradycardia required initiation of atropine  along with dopamine followed by levophed.  Post intervention the patient did stabilize.  The following day the patient had a staged PCI of the ramus lesion using a 2.25 x 15 mm resolute drug-eluting stent.  The patient was placed on dual antiplatelet therapy with aspirin and Brilinta, with follow-up echocardiogram revealing EF of 40% to 45% with severe akinesis of the mid anterior septal wall.  The patient was started on carvedilol 3.125 mg twice daily and high intensity statin, rosuvastatin 40 mg daily prior to discharge.  He is doing well and is asymptomatic. He has lost 10-12 lbs since leaving the hospital while staying on heart healthy diet and walking each day. He is dizzy and light headed with BP ranging below 854 systolic throughout the day. He did not take the coreg due to this, and continued on Micardis 40 mg. He denies chest pain or DOE.   The patient does not  have symptoms concerning for COVID-19 infection (fever, chills, cough, or new shortness of breath).    Past Medical History:  Diagnosis Date  . Acute upper respiratory infections of unspecified site   . Allergic rhinitis, cause unspecified   . Anal stenosis   . Asthma   . Cataract   . Coronary artery disease   . Crohn's colitis ([place])   . Dizziness and giddiness   . Exostosis of unspecified site   . GERD (gastroesophageal reflux disease)   . Hyperplastic colon polyp 02/26/2008  . Hypertension   . Lateral epicondylitis  of elbow   . Myocardial infarction ([place]) 06/2018  .  Nontraumatic rupture of patellar tendon   . Obesity, unspecified   . Regional enteritis of unspecified site   . Seasonal allergies   . Sessile colonic polyp   . Vitamin B12 deficiency    Past Surgical History:  Procedure Laterality Date  . COLONOSCOPY     2016  . CORONARY STENT INTERVENTION N/A 07/24/2018   Procedure: CORONARY STENT INTERVENTION;  Surgeon: Troy Sine, MD;  Location: [place] [facility];  Service: Cardiovascular;  Laterality: N/A;   . CORONARY/GRAFT ACUTE MI REVASCULARIZATION N/A 07/23/2018   Procedure: CORONARY/GRAFT ACUTE MI REVASCULARIZATION;  Surgeon: [name], MD;  Location: [place] [facility];  Service: Cardiovascular;  Laterality: N/A;  . LEFT HEART CATH N/A 07/24/2018   Procedure: LEFT HEART CATH;  Surgeon: Troy Sine, MD;  Location: [place] [facility];  Service: Cardiovascular;  Laterality: N/A;  . RIGHT HEART CATH N/A 07/23/2018   Procedure: RIGHT HEART CATH;  Surgeon: [name], MD;  Location: [place] [facility];  Service: Cardiovascular;  Laterality: N/A;  . TONSILLECTOMY AND ADENOIDECTOMY       No outpatient medications have been marked as taking for the 08/11/18 encounter (Appointment) with Lendon Colonel, NP.     Allergies:   Asacol [mesalamine]; Penicillins; Amoxicillin; and Sulfamethoxazole   Social History   Tobacco Use  . Smoking status: Never Smoker  . Smokeless tobacco: Never Used  Substance Use Topics  . Alcohol use: Yes    Alcohol/week: 0.0 standard drinks    Comment: rare  . Drug use: No     Family Hx: The patient's family history includes Diabetes in his brother and father; Heart attack in his father; Heart disease in his father; Hypertension in his father. There is no history of Colon cancer, Stomach cancer, Hyperlipidemia, Sudden death, Cancer, COPD, Alcohol abuse, Drug abuse, Stroke, Rectal cancer, or Esophageal cancer.  ROS:   Please see the history of present illness.    All other systems reviewed and are negative.   Prior CV studies:   The following studies were reviewed today: Cath 07/23/2018  CULPRIT LESION: Prox RCA to Mid RCA lesion is 100% stenosed.  A drug-eluting stent was successfully placed using a STENT RESOLUTE ONYX 3.5X26. Deployed to 3.6-3.7 mm  Post intervention, there is a 0% residual stenosis.  Dist RCA lesion is 50% stenosed with 40% stenosed side branch in Post Atrio likely related to downstream spasm (not present after initial  balloon inflation.  ---------------------------------------  Ost 1st Diag lesion is 60% stenosed.  Prox LAD lesion is 100% stenosed after 1st Diag & 1st Septal. -- likely CTO (R-L collaterals);  Ost Ramus to Ramus lesion is 85-90% stenosed. -- Would consider Staged PCI prior to discharge.  ----------------------------------------  There is mild to moderate left ventricular systolic dysfunction. The left ventricular ejection fraction is 35-45% by visual estimate.  LV end diastolic pressure is normal. And right heart cath pressures are essentially normal.  Hyperdynamic cardiac output on vasopressors. Indicates stable mild cardiogenic shock  SUMMARY  Severe three-vessel CAD:  Culprit lesion 100% occluded proximal to mid large dominant RCA (successful DES PCI using Resolute Onyx 3.5 mm x 26 mm--3.6 mm)  100% likely CTO of early proximal-mid LAD (after D1 and SP1) with right to left collaterals --> will need to assess for anterior wall viability once convalescence from MI is complete  85 to 90% proximal large branching Ramus Intermedius  Mildly reduced LVEF with apparent anterior and inferior hypokinesis.  Initial RV  infarct related to CARDIOGENIC SHOCK with transient significant bradycardia requiring initial atropine and dopamine followed by Levophed, now stabilized.  Normal right heart cath pressures with stable cardiac output post PCI and on pressors.    Echo 07/23/2018 1. Severe akinesis of the left ventricular, mid anteroseptal wall. 2. The left ventricle has mild-moderately reduced systolic function, with an ejection fraction of 40-45%. The cavity size was normal. Left ventricular diastolic Doppler parameters are consistent with impaired relaxation. 3. Left atrial size was not assessed. 4. The mitral valve is grossly normal. 5. No stenosis of the aortic valve. 6. The interatrial septum was not assessed.   Cath 07/24/2018  Ost 1st Diag lesion is 60%  stenosed.  Prox LAD lesion is 100% stenosed.  Previously placed Prox RCA to Mid RCA drug eluting stent is widely patent.  Balloon angioplasty was performed.  Dist RCA lesion is 50% stenosed with 40% stenosed side branch in Post Atrio.  Ost Ramus to Ramus lesion is 90% stenosed.  Post intervention, there is a 0% residual stenosis.  A stent was successfully placed.  Successful staged PCI to a 90% proximal ramus intermediate stenosis with ultimate insertion of a 2.25 x 15 mm Resolute Onyx DES stent postdilated to 2.5 mm with the 90% stenosis being reduced to 0%.   Labs/Other Tests and Data Reviewed:    EKG:  No ECG reviewed.  Recent Labs: 02/23/2018: TSH 0.79 07/25/2018: Hemoglobin 9.8; Magnesium 1.9; Platelets 162 07/28/2018: ALT 28; BUN 22; Creatinine, Ser 1.29; Potassium 4.2; Sodium 136   Recent Lipid Panel Lab Results  Component Value Date/Time   CHOL 117 07/24/2018 01:03 AM   TRIG 72 07/24/2018 01:03 AM   HDL 64 07/24/2018 01:03 AM   CHOLHDL 1.8 07/24/2018 01:03 AM   LDLCALC 39 07/24/2018 01:03 AM    Wt Readings from Last 3 Encounters:  07/23/18 190 lb (86.2 kg)  06/02/18 190 lb 6 oz (86.4 kg)  02/23/18 192 lb (87.1 kg)     Objective:    Vital Signs:  There were no vitals taken for this visit.   VITAL SIGNS:  reviewed GEN:  no acute distress RESPIRATORY:  normal respiratory effort, symmetric expansion NEURO:  alert and oriented x 3, no obvious focal deficit PSYCH:  normal affect  ASSESSMENT & PLAN:    1. CAD: S/P Acute inferior STEMI: He has DES to the RCA, and staged PCI of the ramus intermedius. He has residual 100% proximal LAD occlusion after the first diagonal with right to left collaterals. Discussion of need for MRI to further evaluate if he becomes symptomatic.      He remains on DAPT with ASA and Brilinta. He has not taken coreg due to hypotension. He is otherwise medically complaint and asymptomatic. He is interested in cardiac rehab.  2.  Hypotension: BP < 010 systolic and symptomatic with dizziness and light headedness I am taking him off of Micardis 40 mg and will wait to start coreg until close follow up in one week. May only need the coreg for now, and then add back low dose ARB if BP can tolerate.   3. Ischemic CM with Systolic Dysfunction:  EF of 40%-45% per echo post MI. Will repeat echo in 3 months on optimal medical therapy. For now he will need to be safe to start coreg without symptomatic hypotension. He is a risk for fall with dizziness and while on DAPT he can have significant bleeding with injury if this occurs.   4. Hypercholesterolemia; On high dose  statin. Will check this again in 3 months with goal of LDL < 70          COVID-19 Education: The signs and symptoms of COVID-19 were discussed with the patient and how to seek care for testing (follow up with PCP or arrange E-visit).  The importance of social distancing was discussed today.  Time:   Today, I have spent 20 minutes with the patient with telehealth technology discussing the above problems.     Medication Adjustments/Labs and Tests Ordered: Current medicines are reviewed at length with the patient today.  Concerns regarding medicines are outlined above.   Tests Ordered: No orders of the defined types were placed in this encounter.   Medication Changes: No orders of the defined types were placed in this encounter.   Disposition:  Follow up one week.   Signed, Phill Myron. West Pugh, ANP, AACC  08/09/2018 4:33 PM    [facility]

## 2018-08-10 ENCOUNTER — Telehealth: Payer: Self-pay | Admitting: Adult Health

## 2018-08-10 NOTE — Telephone Encounter (Signed)
Mychart, smartphone, consent ( 08/07/18) pre reg complete 08/10/18 AF

## 2018-08-11 ENCOUNTER — Encounter: Payer: Self-pay | Admitting: Adult Health

## 2018-08-11 ENCOUNTER — Telehealth (INDEPENDENT_AMBULATORY_CARE_PROVIDER_SITE_OTHER): Payer: Medicare Other | Admitting: Adult Health

## 2018-08-11 VITALS — BP 94/65 | HR 80 | Ht 66.5 in | Wt 178.5 lb

## 2018-08-11 DIAGNOSIS — I2119 ST elevation (STEMI) myocardial infarction involving other coronary artery of inferior wall: Secondary | ICD-10-CM | POA: Diagnosis not present

## 2018-08-11 DIAGNOSIS — E7800 Pure hypercholesterolemia, unspecified: Secondary | ICD-10-CM

## 2018-08-11 DIAGNOSIS — I519 Heart disease, unspecified: Secondary | ICD-10-CM

## 2018-08-11 DIAGNOSIS — I952 Hypotension due to drugs: Secondary | ICD-10-CM

## 2018-08-11 NOTE — Addendum Note (Signed)
Addended by: Therisa Doyne on: 08/11/2018 04:00 PM   Modules accepted: Orders

## 2018-08-11 NOTE — Patient Instructions (Signed)
Medication Instructions:  STOP Micardis (Telmisartan)  If you need a refill on your cardiac medications before your next appointment, please call your pharmacy.    Follow-Up: At [facility], you and your health needs are our priority.  As part of our continuing mission to provide you with exceptional heart care, we have created designated Provider Care Teams.  These Care Teams include your primary Cardiologist (physician) and Advanced Practice Providers (APPs -  Physician Assistants and Nurse Practitioners) who all work together to provide you with the care you need, when you need it. . You have been scheduled for a follow-up virtual visit with Dr. Ellyn Hack on Tuesday, 08/18/18 at 10:00 AM.  Any Other Special Instructions Will Be Listed Below (If Applicable). Your physician has requested that you monitor and record your blood pressure readings at home for 1 week. Please use the same machine at the same time of day to check your readings and record them for your follow-up visit.

## 2018-08-13 ENCOUNTER — Telehealth: Payer: Self-pay | Admitting: Cardiology

## 2018-08-13 NOTE — Telephone Encounter (Signed)
smartphone/ consent/ my chart active/ pre reg completed

## 2018-08-18 ENCOUNTER — Encounter: Payer: Self-pay | Admitting: Cardiology

## 2018-08-18 ENCOUNTER — Telehealth (INDEPENDENT_AMBULATORY_CARE_PROVIDER_SITE_OTHER): Payer: Medicare Other | Admitting: Cardiology

## 2018-08-18 ENCOUNTER — Other Ambulatory Visit: Payer: Self-pay | Admitting: Internal Medicine

## 2018-08-18 ENCOUNTER — Telehealth: Payer: Self-pay | Admitting: *Deleted

## 2018-08-18 VITALS — BP 99/60 | HR 87 | Ht 66.5 in | Wt 179.5 lb

## 2018-08-18 DIAGNOSIS — I255 Ischemic cardiomyopathy: Secondary | ICD-10-CM

## 2018-08-18 DIAGNOSIS — I9589 Other hypotension: Secondary | ICD-10-CM

## 2018-08-18 DIAGNOSIS — I2510 Atherosclerotic heart disease of native coronary artery without angina pectoris: Secondary | ICD-10-CM

## 2018-08-18 DIAGNOSIS — I2119 ST elevation (STEMI) myocardial infarction involving other coronary artery of inferior wall: Secondary | ICD-10-CM

## 2018-08-18 DIAGNOSIS — E785 Hyperlipidemia, unspecified: Secondary | ICD-10-CM

## 2018-08-18 DIAGNOSIS — I10 Essential (primary) hypertension: Secondary | ICD-10-CM

## 2018-08-18 DIAGNOSIS — I951 Orthostatic hypotension: Secondary | ICD-10-CM | POA: Insufficient documentation

## 2018-08-18 MED ORDER — MIDODRINE HCL 2.5 MG PO TABS: ORAL_TABLET | ORAL | 2 refills | Status: DC

## 2018-08-18 NOTE — Assessment & Plan Note (Signed)
Status post PCI to the RCA and ramus intermedius with CTO of the LAD. On aspirin plus Brilinta at this time. --Would like to continue DAPT for up to year if possible, but if necessary can stop aspirin after 6 months. Would probably continue Thienopyridine antiplatelet agent beyond 1 year because the importance of RCA patency.

## 2018-08-18 NOTE — Assessment & Plan Note (Addendum)
Doing usual duties still notable amount of good blood pressure response.  Seems to be doing okay besides some dizziness with for standing and just at baseline.  No longer on beta-blocker or ARB. Encourage think we take a duration and liberalize salt intake although this is careful in the setting of reduced EF. We will actually prescribe once daily and PRN midodrine 2.5 mg daily

## 2018-08-18 NOTE — Patient Instructions (Addendum)
Medication Instructions:  Continue to Hold MIcardis  Start Midodrine 2.5 mg PO q AM -- Disp #40, 2 refills (take additional dose after 6 hr if still dizzy or for SBP < 95 bpm with dizziness).  If you need a refill on your cardiac medications before your next appointment, please call your pharmacy.   Lab work: none   Testing/Procedures: Cardiac MRI- WILL BE SCHEDULE AT [facility] -- Your physician has requested that you have a cardiac MRI. Cardiac MRI uses a computer to create images of your heart as its beating, producing both still and moving pictures of your heart and major blood vessels. For further information please visit [email or web address] Please follow the instruction sheet given to you today for more information.   Follow-Up: At [facility], you and your health needs are our priority.  As part of our continuing mission to provide you with exceptional heart care, we have created designated Provider Care Teams.  These Care Teams include your primary Cardiologist (physician) and Advanced Practice Providers (APPs -  Physician Assistants and Nurse Practitioners) who all work together to provide you with the care you need, when you need it. . You will need a follow up appointment in ~2 months with [name], DNP  4 months with Glenetta Hew, MD . Please call our office 2 months in advance to schedule this appointment.  You may see Glenetta Hew, MD or one of the following Advanced Practice Providers on your designated Care Team:   . Rosaria Ferries, PA-C . [name], DNP, ANP  Any Other Special Instructions Will Be Listed Below .   HYDRATE

## 2018-08-18 NOTE — Telephone Encounter (Signed)
SPOKE TO PATIENT INSTRUCTIONS GIVEN FROM TELE-VISIT 5/26.  AVS SUMMARY WILL BE E-SENT TO MYCHART. PATIENT VERBALIZED UNDERSTANDING

## 2018-08-18 NOTE — Assessment & Plan Note (Signed)
Currently hypotensive now.  No longer on medications as he was unable to tolerate low-dose ARB.

## 2018-08-18 NOTE — Assessment & Plan Note (Signed)
Pretty significant anterior wall motion normality noted on echo that did improve after revascularization.  The LAD does appear to be somewhat collateralized, the question would be is if there is viability in the anterior wall potentially warranting LAD PCI for longevity benefit.  Blood pressure and heart rate would not tolerate beta-blocker or ACE inhibitor/ARB. Is euvolemic, therefore no diuretic requirement.

## 2018-08-18 NOTE — Progress Notes (Signed)
Virtual Visit via Video Note   This visit type was conducted due to national recommendations for restrictions regarding the COVID-19 Pandemic (e.g. social distancing) in an effort to limit this patient's exposure and mitigate transmission in our community.  Due to his co-morbid illnesses, this patient is at least at moderate risk for complications without adequate follow up.  This format is felt to be most appropriate for this patient at this time.  All issues noted in this document were discussed and addressed.  A limited physical exam was performed with this format.  Please refer to the patient's chart for his consent to telehealth for [facility].   Patient has given verbal permission to conduct this visit via virtual appointment and to bill insurance 08/18/2018 6:04 PM     Evaluation Performed:  Follow-up visit  Date:  08/18/2018   ID:  Darryl [name], Darryl [name] 23-Mar-1951, MRN [id number]  Patient Location: Home Provider Location: Home  PCP:  Janith Lima, MD  Cardiologist:  Glenetta Hew, MD Electrophysiologist:  None   Chief Complaint:  1 week f/u - CAD-STEMI, hypotension  History of Present Illness:    Darryl [name] is a 68 y.o. male with PMH notable for CAD-Inferior STEMI - DES RCA with CTO of LAD who presents via audio/video conferencing for a telehealth visit today.  Darryl [name] was last seen last week by [name], DNP and was noted to be somewhat hypotensive with dizziness and lightheadedness.   --Stopped ARB and held beta-blocker.  Interval History:  Overall Darryl [name] seems to be doing pretty well.  He is well back and exercise regimen walking up to 101-1/2 hours a day.  Usually walks about 45 minutes in the morning and then an additional 30-45 minutes in the evening.  He is able to get his heart rates up at about 110 120 bpm denies any signs above resting or exertional chest tightness pressure dyspnea.    What he does note is that he just has plateaued at a level  of exertion and not really get any further than that.  He would like to begin to get back into cycling (has both a stationary and road bike) he also is hoping to be able get back to doing his bowling when it opens up. The most notable symptom he has is that he still is dizzy.  Much better since stopping Micardis, but is still dizzy just sitting at rest but mostly orthostatic.  His had no near syncope or syncope, but just feels a little bit off.   Cardiovascular ROS: no chest pain or dyspnea on exertion positive for - Mild positional dizziness, mostly orthostatic in the morning. negative for - edema, irregular heartbeat, orthopnea, palpitations, paroxysmal nocturnal dyspnea, rapid heart rate, shortness of breath or Syncope/near syncope, TIA shows amaurosis fugax.  Melena, hematochezia, hematuria, epistaxis.  claudication.  The patient does not have symptoms concerning for COVID-19 infection (fever, chills, cough, or new shortness of breath).  The patient is practicing social distancing.  ROS:  Please see the history of present illness.    ROS  Past Medical History:  Diagnosis Date  . Acute upper respiratory infections of unspecified site   . Allergic rhinitis, cause unspecified   . Anal stenosis   . Asthma   . Cataract   . Coronary artery disease involving native coronary artery of native heart without angina pectoris     p-mRCA 100% (DES PCI - p-mRCA 100% (DES PCI - Resolute Onyx 3.5 x  26 --> ~3.7 mm)), dRCA ~50% & PAV 40%; pRI 85% (Staged DES PCI  Resolute Onyx 2.25 x 15 - ~2.5 mm). 100% LAD CTO after small D1 with ost-prox 60%. R-L collaterals to LAD seen post PCI.  Marland Kitchen Crohn's colitis ([place])   . Dizziness and giddiness    Related to borderline hypotension  . Exostosis of unspecified site   . GERD (gastroesophageal reflux disease)   . History of acute inferior wall MI 06/2018   100% p-mRCA -- DES PCI (complicated by mild cardiogenic shock, partially related to 100% LAD CTO) --> staged  PCI-RI  . Hyperlipidemia with target LDL less than 70 07/04/2015   Now s/p MI with MV CAD - optimal LDL < 50  . Hyperplastic colon polyp 02/26/2008  . Hypertension   . Lateral epicondylitis  of elbow   . Nontraumatic rupture of patellar tendon   . Obesity, unspecified   . Regional enteritis of unspecified site   . Seasonal allergies   . Sessile colonic polyp   . Vitamin B12 deficiency    Past Surgical History:  Procedure Laterality Date  . COLONOSCOPY     2016  . CORONARY STENT INTERVENTION N/A 07/24/2018   Procedure: CORONARY STENT INTERVENTION;  Surgeon: Troy Sine, MD;  Location: [facility];  Service: Cardiovascular: Staged PCI pRI 85-90%: Resolute Onyx DES 2.25 x 15 (2.5 mm)  . CORONARY/GRAFT ACUTE MI REVASCULARIZATION N/A 07/23/2018   Procedure: CORONARY/GRAFT ACUTE MI REVASCULARIZATION;  Surgeon: [name], MD;  Location: [place] [facility];  Service: Cardiovascular;::  p-mRCA 100% (DES PCI - p-mRCA 100% (DES PCI - Resolute Onyx 3.5 x 26 (post-dilated ~3.7 mm)), dRCA ~50% & PAV 40%; pRI 85% (Staged PCI). 100% LAD CTO after small D1 with ost-prox 60%. R-L collaterals to LAD seen post PCI.;   . LEFT HEART CATH N/A 07/24/2018   Procedure: LEFT HEART CATH;  Surgeon: Troy Sine, MD;  Location: [place] [facility];  Service: Cardiovascular;  Laterality: N/A; 85-90% pRCA, 60% ost D1 then 100% CTO of LAD -- staged DES PCI LAD  . LEFT HEART CATH AND CORONARY ANGIOGRAPHY  07/23/2018   Procedure: LEFT HEART CATH;  Surgeon: [name]., MD;  Location: [facility]   p-mRCA 100% (DES PCI),dRCA ~50% & PAV 40%; pRI 85% (Staged PCI). 100% LAD CTO after small D1 with ost-prox 60%. R-L collaterals to LAD seen post PCI.;   . RIGHT HEART CATH N/A 07/23/2018   Procedure: RIGHT HEART CATH;  Surgeon: [name], MD;  Location: [place];  Service: Cardiovascular;  Normal RHC #s, CO/CI normal  . TONSILLECTOMY AND ADENOIDECTOMY    . TRANSTHORACIC ECHOCARDIOGRAM   07/23/2018   Insetting of inferior MI with occluded LAD CTO     Current Meds  Medication Sig  . allopurinol (ZYLOPRIM) 100 MG tablet Take 1 tablet (100 mg total) by mouth daily.  Marland Kitchen aspirin EC 81 MG EC tablet Take 1 tablet (81 mg total) by mouth daily.  . Budesonide ER (UCERIS) 9 MG TB24 Take 1 tablet by mouth daily.  . Cholecalciferol (VITAMIN D3) 1000 units CAPS Take 1 capsule by mouth daily.  . fluticasone-salmeterol (ADVAIR HFA) 115-21 MCG/ACT inhaler USE 2 PUFFS TWICE DAILY AS NEEDED. SHAKE WELL  . folic acid (FOLVITE) 1 MG tablet TAKE 1 TABLET ONCE DAILY. (Patient taking differently: Take 1 mg by mouth daily. )  . hydrocortisone 2.5 % cream   . loperamide (IMODIUM A-D) 2 MG  tablet Take 2 mg by mouth 4 (four) times daily as needed for diarrhea or loose stools.  . mercaptopurine (PURINETHOL) 50 MG tablet Take 1 tablet (50 mg total) by mouth daily. (Patient taking differently: Take 75 mg by mouth daily. )  . montelukast (SINGULAIR) 10 MG tablet TAKE ONE TABLET AT BEDTIME. (Patient taking differently: Take 10 mg by mouth at bedtime. )  . Multiple Vitamins-Minerals (CENTRUM SILVER PO) Take 1 tablet by mouth daily.  . nitroGLYCERIN (NITROSTAT) 0.4 MG SL tablet Place 1 tablet (0.4 mg total) under the tongue every 5 (five) minutes x 3 doses as needed for chest pain.  . Omega-3 Fatty Acids (DIALYVITE OMEGA-3 CONCENTRATE) 600 MG CAPS Take 1 tablet by mouth 2 (two) times daily.  . pantoprazole (PROTONIX) 40 MG tablet TAKE 1 TABLET ONCE DAILY. (Patient taking differently: Take 40 mg by mouth daily. )  . rosuvastatin (CRESTOR) 40 MG tablet Take 1 tablet (40 mg total) by mouth daily.  . ticagrelor (BRILINTA) 90 MG TABS tablet Take 1 tablet (90 mg total) by mouth 2 (two) times daily.  . traMADol (ULTRAM) 50 MG tablet TAKE ONE TABLET EVERY 6 HOURS AS NEEDED.  . VENTOLIN HFA 108 (90 Base) MCG/ACT inhaler USE 2 PUFFS EVERY 6 HOURS AS NEEDED FOR WHEEZING. **SHAKE WELL** (Patient taking differently: Inhale 2  puffs into the lungs every 6 (six) hours as needed for wheezing or shortness of breath. )  . vitamin B-12 (CYANOCOBALAMIN) 1000 MCG tablet Take 1 tablet (1,000 mcg total) by mouth daily.     Allergies:   Asacol [mesalamine]; Penicillins; Amoxicillin; and Sulfamethoxazole   Social History   Tobacco Use  . Smoking status: Never Smoker  . Smokeless tobacco: Never Used  Substance Use Topics  . Alcohol use: Yes    Alcohol/week: 0.0 standard drinks    Comment: rare  . Drug use: No     Family Hx: The patient's family history includes Diabetes in his brother and father; Heart attack in his father; Heart disease in his father; Hypertension in his father. There is no history of Colon cancer, Stomach cancer, Hyperlipidemia, Sudden death, Cancer, COPD, Alcohol abuse, Drug abuse, Stroke, Rectal cancer, or Esophageal cancer.   Prior CV studies:   The following studies were reviewed today: . Initial R&L CATH-PCI 07/23/2018: p-mRCA 100% (DES PCI - Resolute Onyx 3.5 x 26 (post-dilated ~3.7 mm), dRCA ~50% & PAV 40%; pRI 85% (Staged PCI). 100% LAD CTO after small D1 with ost-prox 60%. R-L collaterals to LAD seen post PCI. EF ~35-40%, Normal LEVDP & RHC #s & CO/CI; stable mild Cardiogenic Shock. o 5/1 Staged PCI pRI: Resolute Onyx DES 2.25 x 15 (2.5 mm) Diagnostic     After staged Intervention        2 D Echo 07/23/2018: EF 40-45% with severe akinesis of the mid inferoseptal wall.  GR 1 DD.  Labs/Other Tests and Data Reviewed:    EKG:  No ECG reviewed.  Recent Labs: 02/23/2018: TSH 0.79 07/25/2018: Hemoglobin 9.8; Magnesium 1.9; Platelets 162 07/28/2018: ALT 28; BUN 22; Creatinine, Ser 1.29; Potassium 4.2; Sodium 136   Recent Lipid Panel Lab Results  Component Value Date/Time   CHOL 117 07/24/2018 01:03 AM   TRIG 72 07/24/2018 01:03 AM   HDL 64 07/24/2018 01:03 AM   CHOLHDL 1.8 07/24/2018 01:03 AM   LDLCALC 39 07/24/2018 01:03 AM    Wt Readings from Last 3 Encounters:  08/18/18 179 lb 8 oz  (81.4 kg)  08/11/18 178 lb 8 oz (  81 kg)  07/23/18 190 lb (86.2 kg)     Objective:    Vital Signs:  BP 99/60   Pulse 87   Ht 5' 6.5" (1.689 m)   Wt 179 lb 8 oz (81.4 kg)   BMI 28.54 kg/m   VITAL SIGNS:  reviewed GEN:  Well nourished, well developed male in no acute distress. RESPIRATORY:  normal respiratory effort, symmetric expansion CARDIOVASCULAR:  no peripheral edema MUSCULOSKELETAL:  no obvious deformities. NEURO:  alert and oriented x 3, no obvious focal deficit PSYCH:  normal affect   ASSESSMENT & PLAN:    Problem List Items Addressed This Visit    Acute ST elevation myocardial infarction (STEMI) of inferior wall (HCC)    Somewhat more significant inferior infarct than would be expected because of the LAD CTO being filled via collaterals from the right. Seemingly recovering quite well with no active angina or heart failure symptoms.  The only issue is that blood pressure will allow [place] to treat with beta-blocker or ACE inhibitor/ARB.      Relevant Medications   midodrine (PROAMATINE) 2.5 MG tablet   CAD S/P percutaneous coronary angioplasty (Chronic)    Status post PCI to the RCA and ramus intermedius with CTO of the LAD. On aspirin plus Brilinta at this time. --Would like to continue DAPT for up to year if possible, but if necessary can stop aspirin after 6 months. Would probably continue Thienopyridine antiplatelet agent beyond 1 year because the importance of RCA patency.      Relevant Medications   midodrine (PROAMATINE) 2.5 MG tablet   Other Relevant Orders   MR CARDIAC MORPHOLOGY W WO CONTRAST   Coronary artery disease involving native coronary artery of native heart without angina pectoris - Primary (Chronic)    Existing CTO of the LAD now with PCI to the RCA and ramus intermedius. Reduced EF on initial LV gram was notably improved on follow-up echocardiogram.  Plan is to evaluate for anterior wall viability/ischemia using cardiac MRI in order to determine  the benefit for possible CTO PCI since he is not actively having angina symptoms.  Plan: CMRI Continue statin At present, unable to tolerate ARB and therefore not able tolerate beta-blocker.      Relevant Medications   midodrine (PROAMATINE) 2.5 MG tablet   Other Relevant Orders   MR CARDIAC MORPHOLOGY W WO CONTRAST   Essential hypertension, benign (Chronic)    Currently hypotensive now.  No longer on medications as he was unable to tolerate low-dose ARB.      Relevant Medications   midodrine (PROAMATINE) 2.5 MG tablet   Hyperlipidemia with target LDL less than 70 (Chronic)   Relevant Medications   midodrine (PROAMATINE) 2.5 MG tablet   Hypotension    Doing usual duties still notable amount of good blood pressure response.  Seems to be doing okay besides some dizziness with for standing and just at baseline.  No longer on beta-blocker or ARB. Encourage think we take a duration and liberalize salt intake although this is careful in the setting of reduced EF. We will actually prescribe once daily and PRN midodrine 2.5 mg daily      Relevant Medications   midodrine (PROAMATINE) 2.5 MG tablet   Other Relevant Orders   MR CARDIAC MORPHOLOGY W WO CONTRAST   Ischemic cardiomyopathy (Chronic)    Pretty significant anterior wall motion normality noted on echo that did improve after revascularization.  The LAD does appear to be somewhat collateralized, the question would be  is if there is viability in the anterior wall potentially warranting LAD PCI for longevity benefit.  Blood pressure and heart rate would not tolerate beta-blocker or ACE inhibitor/ARB. Is euvolemic, therefore no diuretic requirement.      Relevant Medications   midodrine (PROAMATINE) 2.5 MG tablet   Other Relevant Orders   MR CARDIAC MORPHOLOGY W WO CONTRAST      COVID-19 Education: The signs and symptoms of COVID-19 were discussed with the patient and how to seek care for testing (follow up with PCP or  arrange E-visit).   The importance of social distancing was discussed today.  Time:   Today, I have spent 28 minutes with the patient with telehealth technology discussing the above problems.     Medication Adjustments/Labs and Tests Ordered: Current medicines are reviewed at length with the patient today.  Concerns regarding medicines are outlined above.  Medication Instructions:  Continue to Hold Darryl [name]  Start Midodrine 2.5 mg PO q AM -- Disp #40, 2 refills (take additional dose after 6 hr if still dizzy or for SBP < 95 bpm with dizziness).  OK to "liberalize" salt intake with meals & continue to hydrate  Tests Ordered: Orders Placed This Encounter  Procedures  . MR CARDIAC MORPHOLOGY W WO CONTRAST  Cardiac MRI - to evaluate for Anterior wall viability.  Medication Changes: Meds ordered this encounter  Medications  . midodrine (PROAMATINE) 2.5 MG tablet    Sig: Take 2.5 mg every morning , may take an additional dose after 6 hr if still dizzy or for systloic BP less than 95 bpm with dizziness.    Dispense:  40 tablet    Refill:  2  see above  Disposition:  Follow up in ~2 months with [name], DNP- in order to reassess BP & discuss results of CMRI    Signed, Glenetta Hew, MD  08/18/2018 6:04 PM    [place]

## 2018-08-18 NOTE — Assessment & Plan Note (Addendum)
Existing CTO of the LAD now with PCI to the RCA and ramus intermedius. Reduced EF on initial LV gram was notably improved on follow-up echocardiogram.  Plan is to evaluate for anterior wall viability/ischemia using cardiac MRI in order to determine the benefit for possible CTO PCI since he is not actively having angina symptoms.  Plan: CMRI Continue statin At present, unable to tolerate ARB and therefore not able tolerate beta-blocker.

## 2018-08-18 NOTE — Assessment & Plan Note (Signed)
Somewhat more significant inferior infarct than would be expected because of the LAD CTO being filled via collaterals from the right. Seemingly recovering quite well with no active angina or heart failure symptoms.  The only issue is that blood pressure will allow [place] to treat with beta-blocker or ACE inhibitor/ARB.

## 2018-08-19 ENCOUNTER — Other Ambulatory Visit: Payer: Self-pay | Admitting: *Deleted

## 2018-08-19 ENCOUNTER — Other Ambulatory Visit (INDEPENDENT_AMBULATORY_CARE_PROVIDER_SITE_OTHER): Payer: Medicare Other

## 2018-08-19 ENCOUNTER — Other Ambulatory Visit: Payer: Self-pay | Admitting: Internal Medicine

## 2018-08-19 DIAGNOSIS — R7401 Elevation of levels of liver transaminase levels: Secondary | ICD-10-CM

## 2018-08-19 DIAGNOSIS — R740 Nonspecific elevation of levels of transaminase and lactic acid dehydrogenase [LDH]: Secondary | ICD-10-CM

## 2018-08-19 DIAGNOSIS — K5090 Crohn's disease, unspecified, without complications: Secondary | ICD-10-CM

## 2018-08-19 LAB — CBC WITH DIFFERENTIAL/PLATELET
Absolute Monocytes: 154 cells/uL — ABNORMAL LOW (ref 200–950)
Basophils Absolute: 22 cells/uL (ref 0–200)
Basophils Relative: 0.5 %
Eosinophils Absolute: 84 cells/uL (ref 15–500)
Eosinophils Relative: 1.9 %
HCT: 26.5 % — ABNORMAL LOW (ref 38.5–50.0)
Hemoglobin: 9.4 g/dL — ABNORMAL LOW (ref 13.2–17.1)
Lymphs Abs: 1509 cells/uL (ref 850–3900)
MCH: 38.1 pg — ABNORMAL HIGH (ref 27.0–33.0)
MCHC: 35.5 g/dL (ref 32.0–36.0)
MCV: 107.3 fL — ABNORMAL HIGH (ref 80.0–100.0)
MPV: 10.1 fL (ref 7.5–12.5)
Monocytes Relative: 3.5 %
Neutro Abs: 2631 cells/uL (ref 1500–7800)
Neutrophils Relative %: 59.8 %
Platelets: 185 10*3/uL (ref 140–400)
RBC: 2.47 10*6/uL — ABNORMAL LOW (ref 4.20–5.80)
RDW: 17.6 % — ABNORMAL HIGH (ref 11.0–15.0)
Total Lymphocyte: 34.3 %
WBC: 4.4 10*3/uL (ref 3.8–10.8)

## 2018-08-19 LAB — COMPREHENSIVE METABOLIC PANEL
ALT: 19 U/L (ref 0–53)
AST: 19 U/L (ref 0–37)
Albumin: 4 g/dL (ref 3.5–5.2)
Alkaline Phosphatase: 68 U/L (ref 39–117)
BUN: 22 mg/dL (ref 6–23)
CO2: 23 mEq/L (ref 19–32)
Calcium: 8.8 mg/dL (ref 8.4–10.5)
Chloride: 105 mEq/L (ref 96–112)
Creatinine, Ser: 1.22 mg/dL (ref 0.40–1.50)
GFR: 58.88 mL/min — ABNORMAL LOW (ref >60.00)
Glucose, Bld: 124 mg/dL — ABNORMAL HIGH (ref 70–99)
Potassium: 3.9 mEq/L (ref 3.5–5.1)
Sodium: 137 mEq/L (ref 135–145)
Total Bilirubin: 0.7 mg/dL (ref 0.2–1.2)
Total Protein: 6.3 g/dL (ref 6.0–8.3)

## 2018-08-19 MED ORDER — MERCAPTOPURINE 50 MG PO TABS: 75.0000 mg | ORAL_TABLET | Freq: Every day | ORAL | 0 refills | Status: DC

## 2018-08-19 NOTE — Addendum Note (Signed)
Addended by: Boris Lown B on: 08/19/2018 04:24 PM   Modules accepted: Orders

## 2018-08-19 NOTE — Progress Notes (Signed)
c 

## 2018-08-21 ENCOUNTER — Other Ambulatory Visit: Payer: Self-pay

## 2018-08-21 DIAGNOSIS — K5090 Crohn's disease, unspecified, without complications: Secondary | ICD-10-CM

## 2018-08-24 ENCOUNTER — Telehealth: Payer: Self-pay | Admitting: Cardiology

## 2018-08-24 NOTE — Telephone Encounter (Signed)
° ° °  Patient calling to report bruising on arm, could this be from blood thinner? No symptoms

## 2018-08-24 NOTE — Telephone Encounter (Signed)
Returned call to patient of Dr. Ellyn Hack who reports bruising on his arm, no knots. He reports he has not hit or injured this arm. He is on ASA & Brilinta. Explained that these medications can make him bruise easier and if he bleeds for any reason, it can make it harder/longer for it to stop. He voiced understanding. He will monitor the bruise to make sure it does not worsen. No further assistance needed at this time.

## 2018-09-01 ENCOUNTER — Telehealth: Payer: Self-pay | Admitting: Cardiology

## 2018-09-01 NOTE — Telephone Encounter (Signed)
Routed to MD as FYI.

## 2018-09-01 NOTE — Telephone Encounter (Signed)
  Patient is calling because over the past 3 days he has noticed when he goes for his walk he is having some chest pressure and SOB. He also states that he is not able to walk as far due to this. He states that Saturday night he got on his stationary bike and he thinks he did it too hard or too long because since then he has been having these symptoms upon exertion.

## 2018-09-01 NOTE — Telephone Encounter (Signed)
Spoke with patient of Dr. Ellyn Hack. He had cath/stent 4/30. He reports over last 3 days he is having chest pressure & SOB when walking. The symptoms are not severe enough that he has had to take NTG. He gets relief if he stops walking, catches his breath and then he can resume activity. Discussed with patient setting up OV to have EKG done given cardiac history, which he agrees to. Scheduled for 6/10 @ 345pm with Arnold Long DNP

## 2018-09-02 ENCOUNTER — Ambulatory Visit: Payer: Medicare Other | Admitting: Adult Health

## 2018-09-02 ENCOUNTER — Other Ambulatory Visit: Payer: Self-pay

## 2018-09-02 ENCOUNTER — Encounter: Payer: Self-pay | Admitting: Adult Health

## 2018-09-02 VITALS — BP 110/58 | HR 77 | Temp 97.6°F | Ht 66.5 in | Wt 186.1 lb

## 2018-09-02 DIAGNOSIS — I2510 Atherosclerotic heart disease of native coronary artery without angina pectoris: Secondary | ICD-10-CM

## 2018-09-02 DIAGNOSIS — E7800 Pure hypercholesterolemia, unspecified: Secondary | ICD-10-CM | POA: Diagnosis not present

## 2018-09-02 DIAGNOSIS — I259 Chronic ischemic heart disease, unspecified: Secondary | ICD-10-CM | POA: Diagnosis not present

## 2018-09-02 DIAGNOSIS — I43 Cardiomyopathy in diseases classified elsewhere: Secondary | ICD-10-CM

## 2018-09-02 DIAGNOSIS — D649 Anemia, unspecified: Secondary | ICD-10-CM

## 2018-09-02 DIAGNOSIS — Z9861 Coronary angioplasty status: Secondary | ICD-10-CM

## 2018-09-02 LAB — HM DIABETES EYE EXAM

## 2018-09-02 MED ORDER — METOPROLOL TARTRATE 25 MG PO TABS: 25.0000 mg | ORAL_TABLET | Freq: Once | ORAL | 0 refills | Status: DC

## 2018-09-02 NOTE — Patient Instructions (Signed)
Medication Instructions:  STOP ASPIRIN STOP MIDODRINE If you need a refill on your cardiac medications before your next appointment, please call your pharmacy.  Testing/Procedures: Your physician has requested that you have a cardiac MRI. Cardiac MRI uses a computer to create images of your heart as its beating, producing both still and moving pictures of your heart and major blood vessels. For further information please visit [email or web address] Please follow the instruction sheet given to you today for more information. ->> THE MORNING OF THE PROCEDURE TAKE METOPROLOL 25MG AND MIDODRINE 5MG  THIS WILL BE AT [facility]  Follow-Up: You will need a follow up appointment on 09-24-2018 @420PM  WITH Glenetta Hew, MD .  At [facility], you and your health needs are our priority.  As part of our continuing mission to provide you with exceptional heart care, we have created designated Provider Care Teams.  These Care Teams include your primary Cardiologist (physician) and Advanced Practice Providers (APPs -  Physician Assistants and Nurse Practitioners) who all work together to provide you with the care you need, when you need it.  Thank you for choosing [facility]!!

## 2018-09-02 NOTE — Progress Notes (Addendum)
Cardiology Office Note   Date:  09/02/2018   ID:  Darryl, [name] 1950/05/02, MRN [id number]  PCP:  Janith Lima, MD  Cardiologist:  Dr. Ellyn Hack  No chief complaint on file.    History of Present Illness: Darryl [name] is a 68 y.o. male who presents for ongoing assessment and management of CAD, with hx of inferior STEMI with DES to the RCA with CTO of the LAD after a small diagonal branch on 07/13/2018 He was last seen via virtual visit by Dr. Ellyn Hack on 08/18/2018. I had seen him prior to that visit and discontinued his ARB due to hypotension and dizziness and held BB.Marland Kitchen He was prescribed midodrine 2.5 mg daily due to hypotension on visit with Dr. Ellyn Hack.   He called our office on 09/01/2018 with complaints of chest pressure and shortness of breath while walking. Has to stop and catch his breath, then continue his walking. He is being seen for further evaluation.   He reports that he normally walks about 3 miles a day with some hills and has not had any problems until about 2 weeks ago. He states he now has to stop when he is walking to catch his breath. He feels pressure in his chest when he is walking. He states the last couple of days he has not been able to walk any further than a couple of houses from him on a flat road. He has some dizziness but not as severe as he did in the past. He is compliant with his midodrine dose daily. No LEE or orthopnea.   He is also noted to be anemic with Hgb slowly dropping from 12.0 to 9.4 from 07/24/2018 to 08/19/2018. He is being followed by a gastroenterologist for Crohn's disease.    Past Medical History:  Diagnosis Date  . Acute upper respiratory infections of unspecified site   . Allergic rhinitis, cause unspecified   . Anal stenosis   . Asthma   . Cataract   . Coronary artery disease involving native coronary artery of native heart without angina pectoris     p-mRCA 100% (DES PCI - p-mRCA 100% (DES PCI - Resolute Onyx 3.5 x 26 --> ~3.7 mm)),  dRCA ~50% & PAV 40%; pRI 85% (Staged DES PCI  Resolute Onyx 2.25 x 15 - ~2.5 mm). 100% LAD CTO after small D1 with ost-prox 60%. R-L collaterals to LAD seen post PCI.  Marland Kitchen Crohn's colitis ([place])   . Dizziness and giddiness    Related to borderline hypotension  . Exostosis of unspecified site   . GERD (gastroesophageal reflux disease)   . History of acute inferior wall MI 06/2018   100% p-mRCA -- DES PCI (complicated by mild cardiogenic shock, partially related to 100% LAD CTO) --> staged PCI-RI  . Hyperlipidemia with target LDL less than 70 07/04/2015   Now s/p MI with MV CAD - optimal LDL < 50  . Hyperplastic colon polyp 02/26/2008  . Hypertension   . Lateral epicondylitis  of elbow   . Nontraumatic rupture of patellar tendon   . Obesity, unspecified   . Regional enteritis of unspecified site   . Seasonal allergies   . Sessile colonic polyp   . Vitamin B12 deficiency     Past Surgical History:  Procedure Laterality Date  . COLONOSCOPY     2016  . CORONARY STENT INTERVENTION N/A 07/24/2018   Procedure: CORONARY STENT INTERVENTION;  Surgeon: Troy Sine, MD;  Location: [facility];  Service: Cardiovascular: Staged PCI pRI 85-90%: Resolute Onyx DES 2.25 x 15 (2.5 mm)  . CORONARY/GRAFT ACUTE MI REVASCULARIZATION N/A 07/23/2018   Procedure: CORONARY/GRAFT ACUTE MI REVASCULARIZATION;  Surgeon: [name], MD;  Location: [facility];  Service: Cardiovascular;::  p-mRCA 100% (DES PCI - p-mRCA 100% (DES PCI - Resolute Onyx 3.5 x 26 (post-dilated ~3.7 mm)), dRCA ~50% & PAV 40%; pRI 85% (Staged PCI). 100% LAD CTO after small D1 with ost-prox 60%. R-L collaterals to LAD seen post PCI.;   . LEFT HEART CATH N/A 07/24/2018   Procedure: LEFT HEART CATH;  Surgeon: Troy Sine, MD;  Location: [facility];  Service: Cardiovascular;  Laterality: N/A; 85-90% pRCA, 60% ost D1 then 100% CTO of LAD -- staged DES PCI LAD  . LEFT HEART CATH AND CORONARY ANGIOGRAPHY  07/23/2018    Procedure: LEFT HEART CATH;  Surgeon: [name]., MD;  Location: [facility]   p-mRCA 100% (DES PCI),dRCA ~50% & PAV 40%; pRI 85% (Staged PCI). 100% LAD CTO after small D1 with ost-prox 60%. R-L collaterals to LAD seen post PCI.;   . RIGHT HEART CATH N/A 07/23/2018   Procedure: RIGHT HEART CATH;  Surgeon: [name], MD;  Location: [facility];  Service: Cardiovascular;  Normal RHC #s, CO/CI normal  . TONSILLECTOMY AND ADENOIDECTOMY    . TRANSTHORACIC ECHOCARDIOGRAM  07/23/2018   Insetting of inferior MI with occluded LAD CTO     Current Outpatient Medications  Medication Sig Dispense Refill  . allopurinol (ZYLOPRIM) 100 MG tablet TAKE 1 TABLET ONCE DAILY. 30 tablet 0  . aspirin EC 81 MG EC tablet Take 1 tablet (81 mg total) by mouth daily.    . Budesonide ER (UCERIS) 9 MG TB24 Take 1 tablet by mouth daily. 30 tablet 1  . Cholecalciferol (VITAMIN D3) 1000 units CAPS Take 1 capsule by mouth daily.    . fluticasone-salmeterol (ADVAIR HFA) 115-21 MCG/ACT inhaler USE 2 PUFFS TWICE DAILY AS NEEDED. SHAKE WELL 12 g 3  . folic acid (FOLVITE) 1 MG tablet TAKE 1 TABLET ONCE DAILY. (Patient taking differently: Take 1 mg by mouth daily. ) 90 tablet 1  . hydrocortisone 2.5 % cream     . loperamide (IMODIUM A-D) 2 MG tablet Take 2 mg by mouth 4 (four) times daily as needed for diarrhea or loose stools.    . mercaptopurine (PURINETHOL) 50 MG tablet Take 1.5 tablets (75 mg total) by mouth daily. NEEDS LABS 45 tablet 0  . midodrine (PROAMATINE) 2.5 MG tablet Take 2.5 mg every morning , may take an additional dose after 6 hr if still dizzy or for systloic BP less than 95 bpm with dizziness. 40 tablet 2  . montelukast (SINGULAIR) 10 MG tablet TAKE ONE TABLET AT BEDTIME. (Patient taking differently: Take 10 mg by mouth at bedtime. ) 90 tablet 1  . Multiple Vitamins-Minerals (CENTRUM SILVER PO) Take 1 tablet by mouth daily.    . nitroGLYCERIN (NITROSTAT) 0.4 MG SL tablet Place 1 tablet  (0.4 mg total) under the tongue every 5 (five) minutes x 3 doses as needed for chest pain. 25 tablet 3  . Omega-3 Fatty Acids (DIALYVITE OMEGA-3 CONCENTRATE) 600 MG CAPS Take 1 tablet by mouth 2 (two) times daily.    . pantoprazole (PROTONIX) 40 MG tablet TAKE 1 TABLET ONCE DAILY. (Patient taking differently: Take 40 mg by mouth daily. ) 90 tablet 1  . rosuvastatin (CRESTOR) 40 MG tablet Take 1  tablet (40 mg total) by mouth daily. 90 tablet 3  . ticagrelor (BRILINTA) 90 MG TABS tablet Take 1 tablet (90 mg total) by mouth 2 (two) times daily. 60 tablet 11  . traMADol (ULTRAM) 50 MG tablet TAKE ONE TABLET EVERY 6 HOURS AS NEEDED. 30 tablet 0  . VENTOLIN HFA 108 (90 Base) MCG/ACT inhaler USE 2 PUFFS EVERY 6 HOURS AS NEEDED FOR WHEEZING. **SHAKE WELL** (Patient taking differently: Inhale 2 puffs into the lungs every 6 (six) hours as needed for wheezing or shortness of breath. ) 18 g 3  . vitamin B-12 (CYANOCOBALAMIN) 1000 MCG tablet Take 1 tablet (1,000 mcg total) by mouth daily.    . metoprolol tartrate (LOPRESSOR) 25 MG tablet Take 1 tablet (25 mg total) by mouth once for 1 dose. TAKE THE MORNING OF MRI 1 tablet 0   No current facility-administered medications for this visit.     Allergies:   Asacol [mesalamine]; Penicillins; Amoxicillin; and Sulfamethoxazole    Social History:  The patient  reports that he has never smoked. He has never used smokeless tobacco. He reports current alcohol use. He reports that he does not use drugs.   Family History:  The patient's family history includes Diabetes in his brother and father; Heart attack in his father; Heart disease in his father; Hypertension in his father.    ROS: All other systems are reviewed and negative. Unless otherwise mentioned in H&P    PHYSICAL EXAM: VS:  BP (!) 110/58   Pulse 77   Temp 97.6 F (36.4 C)   Ht 5' 6.5" (1.689 m)   Wt 186 lb 1.6 oz (84.4 kg)   SpO2 99%   BMI 29.59 kg/m  , BMI Body mass index is 29.59 kg/m. GEN:  Well nourished, well developed, in no acute distress HEENT: normal Neck: no JVD, carotid bruits, or masses Cardiac: RRR; no murmurs, rubs, or gallops,no edema  Respiratory:  Clear to auscultation bilaterally, normal work of breathing GI: soft, nontender, nondistended, + BS MS: no deformity or atrophy Skin: warm and dry, no rash Neuro:  Strength and sensation are intact Psych: euthymic mood, full affect   EKG:  NSR with occasional PVC's, inferior T-wave abnormality and possible anterior infarct. Rate of 77 bpm. (this was over read by Dr. Ellyn Hack).   Recent Labs: 02/23/2018: TSH 0.79 07/25/2018: Magnesium 1.9 08/19/2018: ALT 19; BUN 22; Creatinine, Ser 1.22; Hemoglobin 9.4; Platelets 185; Potassium 3.9; Sodium 137    Lipid Panel    Component Value Date/Time   CHOL 117 07/24/2018 0103   TRIG 72 07/24/2018 0103   HDL 64 07/24/2018 0103   CHOLHDL 1.8 07/24/2018 0103   VLDL 14 07/24/2018 0103   LDLCALC 39 07/24/2018 0103      Wt Readings from Last 3 Encounters:  09/02/18 186 lb 1.6 oz (84.4 kg)  08/18/18 179 lb 8 oz (81.4 kg)  08/11/18 178 lb 8 oz (81 kg)      Other studies Reviewed: The following studies were reviewed today:  Initial R&L CATH-PCI 07/23/2018: p-mRCA 100% (DES PCI - Resolute Onyx 3.5 x 26 (post-dilated ~3.7 mm), dRCA ~50% & PAV 40%; pRI 85% (Staged PCI). 100% LAD CTO after small D1 with ost-prox 60%. R-L collaterals to LAD seen post PCI. EF ~35-40%, Normal LEVDP & RHC #s & CO/CI; stable mild Cardiogenic Shock. ? 5/1 Staged PCI pRI: Resolute Onyx DES 2.25 x 15 (2.5 mm) Diagnostic  After staged Intervention        2 D Echo 07/23/2018: EF 40-45% with severe akinesis of the mid inferoseptal wall.  GR 1 DD.   ASSESSMENT AND PLAN:  1.  CAD: He has had PCI to the proximal to mid RCA (4/30/202)., and staged PCI to the proximal RI on 07/24/2018. He has a CTO LAD with collaterals.     I have discussed the patient's symptoms with  Dr. Ellyn Hack who is on site as DOD at [facility] today. He is very familiar with Mr. Pileggi case. He recommends that he have a cardiac MRI for viability of the anterior myocardium, for consideration of intervention by Dr. Martinique to the CTO LAD. He may be given metoprolol 25 mg for HR control for the test as which time will need to take 5 mg of Midodrine to prevent hypotension.      I have discussed this with the patient who is agreeable to have this test, as he states that he and Dr. Ellyn Hack had discussed this in the past. He will follow up with Dr. Ellyn Hack for discussion of test results and further recommendations.   2. ICM: EF of 40-45%. Due to issues with breathing it is recommended that he stop the midodrine temporarily in the case of some fluid retention.  He may have some lower BP and some dizziness. I have asked him not to over exert himself outside until we get more data on his cardiac status. He verbalizes understanding.   3. Anemia: Work up per GI. Will stop ASA, but continue Brilinta.    Current medicines are reviewed at length with the patient today.  I have spent 45 minutes with this patient and in discussion with Dr. Ellyn Hack concerning symptoms and recommendations.   Labs/ tests ordered today include: Cardiac MRI.   Phill Myron. West Pugh, ANP, AACC   09/02/2018 5:31 PM    [address] Office [phone] Fax [phone]

## 2018-09-04 ENCOUNTER — Telehealth: Payer: Self-pay | Admitting: Cardiology

## 2018-09-04 NOTE — Telephone Encounter (Signed)
Spoke with pt, aware the order is placed and at times it will take some time to get insurance approval. Will forward to [facility] to follow.

## 2018-09-04 NOTE — Telephone Encounter (Signed)
Patient called wanting to make sure that someone was in the process of scheduling his MRI.

## 2018-09-08 ENCOUNTER — Other Ambulatory Visit: Payer: Self-pay | Admitting: Internal Medicine

## 2018-09-08 DIAGNOSIS — J4520 Mild intermittent asthma, uncomplicated: Secondary | ICD-10-CM

## 2018-09-10 ENCOUNTER — Telehealth: Payer: Self-pay | Admitting: Internal Medicine

## 2018-09-10 DIAGNOSIS — D649 Anemia, unspecified: Secondary | ICD-10-CM

## 2018-09-10 NOTE — Telephone Encounter (Signed)
patient wanted Dr. Hilarie Fredrickson to know that he is doing great on the new meds.  "absolutely no problems with Crohn's".  He is concerned about the anemia.  We did discuss that he is due for labs the end of this month.  He is advised to come the end of next week or the very end of June.  Once we have the labs back , Dr. Hilarie Fredrickson will be able to make decisions on med changes if necessary. He thanked me for the call and understands to come for labs the end of next week or beginning of the following.

## 2018-09-10 NOTE — Telephone Encounter (Signed)
Please add on a ferritin, IBC panel, B12, folate to the labs that he will have done soon

## 2018-09-10 NOTE — Telephone Encounter (Signed)
Patient called said that he had a Heart attach on 4-30 and that Dr. Hilarie Fredrickson changed his med (allopurinol) for chron's but since he changed the medication he has been anemic and would like to know if that med that he change could be the cause of it.

## 2018-09-11 NOTE — Addendum Note (Signed)
Addended by: Marlon Pel on: 09/11/2018 10:36 AM   Modules accepted: Orders

## 2018-09-11 NOTE — Telephone Encounter (Signed)
New labs entered

## 2018-09-18 ENCOUNTER — Telehealth: Payer: Self-pay | Admitting: Cardiology

## 2018-09-18 NOTE — Telephone Encounter (Signed)
Patient would like paper instructions mailed to him about the MRI he is to have done on Monday 07/06

## 2018-09-18 NOTE — Telephone Encounter (Signed)
Called and advised patient of which instructions, he wanted to verify time and place of MRI- advised patient to go to [facility] and they will assist him in where to go.  Patient had no other questions at this time.

## 2018-09-21 ENCOUNTER — Telehealth: Payer: Self-pay

## 2018-09-21 ENCOUNTER — Other Ambulatory Visit: Payer: Self-pay

## 2018-09-21 ENCOUNTER — Inpatient Hospital Stay (HOSPITAL_COMMUNITY)
Admission: EM | Admit: 2018-09-21 | Discharge: 2018-09-23 | DRG: 809 | Disposition: A | Discharge status: Home Health | Payer: Medicare Other | Source: Ambulatory Visit | Attending: Internal Medicine | Admitting: Internal Medicine

## 2018-09-21 ENCOUNTER — Encounter (HOSPITAL_COMMUNITY): Payer: Self-pay

## 2018-09-21 ENCOUNTER — Telehealth: Payer: Self-pay | Admitting: Internal Medicine

## 2018-09-21 ENCOUNTER — Telehealth: Payer: Self-pay | Admitting: Cardiology

## 2018-09-21 ENCOUNTER — Other Ambulatory Visit (INDEPENDENT_AMBULATORY_CARE_PROVIDER_SITE_OTHER): Payer: Medicare Other

## 2018-09-21 ENCOUNTER — Other Ambulatory Visit: Payer: Self-pay | Admitting: Internal Medicine

## 2018-09-21 DIAGNOSIS — Z6827 Body mass index (BMI) 27.0-27.9, adult: Secondary | ICD-10-CM

## 2018-09-21 DIAGNOSIS — K5010 Crohn's disease of large intestine without complications: Secondary | ICD-10-CM | POA: Diagnosis present

## 2018-09-21 DIAGNOSIS — I10 Essential (primary) hypertension: Secondary | ICD-10-CM | POA: Diagnosis present

## 2018-09-21 DIAGNOSIS — D649 Anemia, unspecified: Secondary | ICD-10-CM | POA: Diagnosis not present

## 2018-09-21 DIAGNOSIS — I2510 Atherosclerotic heart disease of native coronary artery without angina pectoris: Secondary | ICD-10-CM

## 2018-09-21 DIAGNOSIS — Z8719 Personal history of other diseases of the digestive system: Secondary | ICD-10-CM

## 2018-09-21 DIAGNOSIS — E785 Hyperlipidemia, unspecified: Secondary | ICD-10-CM | POA: Diagnosis present

## 2018-09-21 DIAGNOSIS — Z833 Family history of diabetes mellitus: Secondary | ICD-10-CM

## 2018-09-21 DIAGNOSIS — Z8249 Family history of ischemic heart disease and other diseases of the circulatory system: Secondary | ICD-10-CM

## 2018-09-21 DIAGNOSIS — I110 Hypertensive heart disease with heart failure: Secondary | ICD-10-CM | POA: Diagnosis present

## 2018-09-21 DIAGNOSIS — D61818 Other pancytopenia: Principal | ICD-10-CM | POA: Diagnosis present

## 2018-09-21 DIAGNOSIS — D539 Nutritional anemia, unspecified: Secondary | ICD-10-CM | POA: Diagnosis not present

## 2018-09-21 DIAGNOSIS — Z1159 Encounter for screening for other viral diseases: Secondary | ICD-10-CM

## 2018-09-21 DIAGNOSIS — R7303 Prediabetes: Secondary | ICD-10-CM | POA: Diagnosis present

## 2018-09-21 DIAGNOSIS — J45909 Unspecified asthma, uncomplicated: Secondary | ICD-10-CM | POA: Diagnosis present

## 2018-09-21 DIAGNOSIS — I951 Orthostatic hypotension: Secondary | ICD-10-CM | POA: Diagnosis present

## 2018-09-21 DIAGNOSIS — E669 Obesity, unspecified: Secondary | ICD-10-CM | POA: Diagnosis present

## 2018-09-21 DIAGNOSIS — Z9861 Coronary angioplasty status: Secondary | ICD-10-CM | POA: Diagnosis not present

## 2018-09-21 DIAGNOSIS — I5022 Chronic systolic (congestive) heart failure: Secondary | ICD-10-CM | POA: Diagnosis present

## 2018-09-21 DIAGNOSIS — Z955 Presence of coronary angioplasty implant and graft: Secondary | ICD-10-CM

## 2018-09-21 DIAGNOSIS — I152 Hypertension secondary to endocrine disorders: Secondary | ICD-10-CM | POA: Diagnosis present

## 2018-09-21 DIAGNOSIS — K5090 Crohn's disease, unspecified, without complications: Secondary | ICD-10-CM | POA: Diagnosis not present

## 2018-09-21 DIAGNOSIS — Z79899 Other long term (current) drug therapy: Secondary | ICD-10-CM

## 2018-09-21 DIAGNOSIS — E538 Deficiency of other specified B group vitamins: Secondary | ICD-10-CM | POA: Diagnosis present

## 2018-09-21 DIAGNOSIS — K219 Gastro-esophageal reflux disease without esophagitis: Secondary | ICD-10-CM | POA: Diagnosis present

## 2018-09-21 DIAGNOSIS — H269 Unspecified cataract: Secondary | ICD-10-CM | POA: Diagnosis present

## 2018-09-21 DIAGNOSIS — Z7951 Long term (current) use of inhaled steroids: Secondary | ICD-10-CM

## 2018-09-21 DIAGNOSIS — Z888 Allergy status to other drugs, medicaments and biological substances status: Secondary | ICD-10-CM

## 2018-09-21 DIAGNOSIS — E1169 Type 2 diabetes mellitus with other specified complication: Secondary | ICD-10-CM | POA: Diagnosis present

## 2018-09-21 DIAGNOSIS — Z880 Allergy status to penicillin: Secondary | ICD-10-CM

## 2018-09-21 DIAGNOSIS — Z7902 Long term (current) use of antithrombotics/antiplatelets: Secondary | ICD-10-CM

## 2018-09-21 DIAGNOSIS — Z79891 Long term (current) use of opiate analgesic: Secondary | ICD-10-CM

## 2018-09-21 DIAGNOSIS — I252 Old myocardial infarction: Secondary | ICD-10-CM

## 2018-09-21 DIAGNOSIS — Z882 Allergy status to sulfonamides status: Secondary | ICD-10-CM

## 2018-09-21 LAB — COMPREHENSIVE METABOLIC PANEL
ALT: 18 U/L (ref 0–53)
ALT: 24 U/L (ref 0–44)
AST: 19 U/L (ref 0–37)
AST: 24 U/L (ref 15–41)
Albumin: 4.4 g/dL (ref 3.5–5.2)
Albumin: 4 g/dL (ref 3.5–5.0)
Alkaline Phosphatase: 73 U/L (ref 39–117)
Alkaline Phosphatase: 75 U/L (ref 38–126)
Anion gap: 7 (ref 5–15)
BUN: 19 mg/dL (ref 6–23)
BUN: 19 mg/dL (ref 8–23)
CO2: 20 mEq/L (ref 19–32)
CO2: 20 mmol/L — ABNORMAL LOW (ref 22–32)
Calcium: 9.5 mg/dL (ref 8.4–10.5)
Calcium: 9.6 mg/dL (ref 8.9–10.3)
Chloride: 110 mEq/L (ref 96–112)
Chloride: 114 mmol/L — ABNORMAL HIGH (ref 98–111)
Creatinine, Ser: 1.27 mg/dL (ref 0.40–1.50)
Creatinine, Ser: 1.35 mg/dL — ABNORMAL HIGH (ref 0.61–1.24)
GFR calc Af Amer: >60 mL/min (ref >60)
GFR calc non Af Amer: 53 mL/min — ABNORMAL LOW (ref >60)
GFR: 56.2 mL/min — ABNORMAL LOW (ref >60.00)
Glucose, Bld: 110 mg/dL — ABNORMAL HIGH (ref 70–99)
Glucose, Bld: 111 mg/dL — ABNORMAL HIGH (ref 70–99)
Potassium: 3.8 mEq/L (ref 3.5–5.1)
Potassium: 4 mmol/L (ref 3.5–5.1)
Sodium: 141 mEq/L (ref 135–145)
Sodium: 141 mmol/L (ref 135–145)
Total Bilirubin: 0.7 mg/dL (ref 0.2–1.2)
Total Bilirubin: 0.9 mg/dL (ref 0.3–1.2)
Total Protein: 7 g/dL (ref 6.0–8.3)
Total Protein: 6.8 g/dL (ref 6.5–8.1)

## 2018-09-21 LAB — IBC + FERRITIN
Ferritin: >1500 ng/mL — ABNORMAL HIGH (ref 22.0–322.0)
Iron: 282 ug/dL — ABNORMAL HIGH (ref 42–165)
Saturation Ratios: 91.6 % — ABNORMAL HIGH (ref 20.0–50.0)
Transferrin: 220 mg/dL (ref 212.0–360.0)

## 2018-09-21 LAB — CBC
HCT: 13.3 % — ABNORMAL LOW (ref 39.0–52.0)
Hemoglobin: 4.8 g/dL — CL (ref 13.0–17.0)
MCH: 41 pg — ABNORMAL HIGH (ref 26.0–34.0)
MCHC: 36.1 g/dL — ABNORMAL HIGH (ref 30.0–36.0)
MCV: 113.7 fL — ABNORMAL HIGH (ref 80.0–100.0)
Platelets: 162 10*3/uL (ref 150–400)
RBC: 1.17 MIL/uL — ABNORMAL LOW (ref 4.22–5.81)
RDW: 15 % (ref 11.5–15.5)
WBC: 2.7 10*3/uL — ABNORMAL LOW (ref 4.0–10.5)
nRBC: 0 % (ref 0.0–0.2)

## 2018-09-21 LAB — CBC WITH DIFFERENTIAL/PLATELET
Basophils Absolute: 0 10*3/uL (ref 0.0–0.1)
Basophils Relative: 0.3 % (ref 0.0–3.0)
Eosinophils Absolute: 0 10*3/uL (ref 0.0–0.7)
Eosinophils Relative: 0.6 % (ref 0.0–5.0)
HCT: 13.9 % — CL (ref 39.0–52.0)
Hemoglobin: 5 g/dL — CL (ref 13.0–17.0)
Lymphocytes Relative: 41.9 % (ref 12.0–46.0)
Lymphs Abs: 1.2 10*3/uL (ref 0.7–4.0)
MCHC: 36 g/dL (ref 30.0–36.0)
MCV: 113.9 fl — ABNORMAL HIGH (ref 78.0–100.0)
Monocytes Absolute: 0.1 10*3/uL (ref 0.1–1.0)
Monocytes Relative: 4.4 % (ref 3.0–12.0)
Neutro Abs: 1.6 10*3/uL (ref 1.4–7.7)
Neutrophils Relative %: 52.8 % (ref 43.0–77.0)
Platelets: 179 10*3/uL (ref 150.0–400.0)
RBC: 1.22 Mil/uL — ABNORMAL LOW (ref 4.22–5.81)
RDW: 16.3 % — ABNORMAL HIGH (ref 11.5–15.5)
WBC: 3 10*3/uL — ABNORMAL LOW (ref 4.0–10.5)

## 2018-09-21 LAB — VITAMIN B12: Vitamin B-12: 829 pg/mL (ref 211–911)

## 2018-09-21 LAB — ABO/RH: ABO/RH(D): O POS

## 2018-09-21 LAB — PREPARE RBC (CROSSMATCH)

## 2018-09-21 LAB — FOLATE: Folate: >23.9 ng/mL (ref >5.9)

## 2018-09-21 LAB — POC OCCULT BLOOD, ED: Fecal Occult Bld: NEGATIVE

## 2018-09-21 LAB — SARS CORONAVIRUS 2 BY RT PCR (HOSPITAL ORDER, PERFORMED IN ~~LOC~~ HOSPITAL LAB): SARS Coronavirus 2: NEGATIVE

## 2018-09-21 MED ORDER — ACETAMINOPHEN 325 MG PO TABS
650.0000 mg | ORAL_TABLET | Freq: Four times a day (QID) | ORAL | Status: DC | PRN
  Filled 2018-09-21: qty 2

## 2018-09-21 MED ORDER — ACETAMINOPHEN 650 MG RE SUPP: 650.0000 mg | Freq: Four times a day (QID) | RECTAL | Status: DC | PRN

## 2018-09-21 MED ORDER — SODIUM CHLORIDE 0.9% IV SOLUTION
Freq: Once | INTRAVENOUS | Status: AC
  Administered 2018-09-21: 20:00:00 via INTRAVENOUS

## 2018-09-21 MED ORDER — SODIUM CHLORIDE 0.9 % IV BOLUS
500.0000 mL | Freq: Once | INTRAVENOUS | Status: AC
  Administered 2018-09-21: 500 mL via INTRAVENOUS

## 2018-09-21 MED ORDER — PANTOPRAZOLE SODIUM 40 MG IV SOLR
40.0000 mg | Freq: Once | INTRAVENOUS | Status: AC
  Administered 2018-09-21: 40 mg via INTRAVENOUS
  Filled 2018-09-21: qty 40

## 2018-09-21 MED ORDER — SODIUM CHLORIDE 0.9% IV SOLUTION
Freq: Once | INTRAVENOUS | Status: AC
  Administered 2018-09-22: 10 mL via INTRAVENOUS

## 2018-09-21 MED ORDER — SODIUM CHLORIDE 0.9 % IV SOLN
INTRAVENOUS | Status: DC
  Administered 2018-09-22: 03:00:00 via INTRAVENOUS

## 2018-09-21 NOTE — Telephone Encounter (Signed)
Pt informed of providers result & recommendations. Pt verbalized understanding. Pt states that he was told at last visit that he could stop taking the midodrine. It was working well, he will re-start and let us know if he has any s/e. He will take his BP if he is not "feeling well" to see if this is d/t low BP. He will have his labs drawn this week and see if these sx are d/t the anemia. He has his MRI scheduled next week, he will make sure that he gets this done. He will cb with any other issues.

## 2018-09-21 NOTE — Telephone Encounter (Signed)
I left message for patient to call back. Unfortunately, I got voicemail. I contacted patient's wife and spoke to her to ask that she get in touch with patient. He needs to go to the emergency room for evaluation and treatment of his anemia. She indicates that he is at a meeting. I have asked that he not drive to the emergency room as he could easily pass out and hurt himself or another person with a hgb of 5. She thanked me for the call and indicated that she would get in touch with him immediately.

## 2018-09-21 NOTE — Telephone Encounter (Signed)
These Sx sound more c/w anemia. -- Need to see Hgb results.  He does have a Cardiac MRI ordered - this is the most effective way to evaluate cardiac fxn & also to see if he will benefit from attempting LAD PCI.  Is he using his as needed Midodrine for dizziness?  Glenetta Hew, MD

## 2018-09-21 NOTE — Telephone Encounter (Signed)
Patient advised that orders are already in epic that would check his hemoglobin. He verbalizes understanding.

## 2018-09-21 NOTE — Telephone Encounter (Signed)
Pt c/o Shortness Of Breath: STAT if SOB developed within the last 24 hours or pt is noticeably SOB on the phone  1. Are you currently SOB (can you hear that pt is SOB on the phone)? Patient is not SOB now he is sitting down right now and no I can not hear him.  2. How long have you been experiencing SOB? June  3. Are you SOB when sitting or when up moving around? moving  4. Are you currently experiencing any other symptoms? Patient is very weak and pale. When he is walking and get he become SOB

## 2018-09-21 NOTE — ED Provider Notes (Addendum)
[facility] EMERGENCY DEPARTMENT Provider Note   CSN: [id number] Arrival date & time: 09/21/18  1541    History   Chief Complaint Chief Complaint  Patient presents with  . Abnormal Lab    HPI Darryl [name] is a 68 y.o. male.     Level 5 caveat for acuity of condition.  Patient was seen by his gastroenterologist and sent to the emergency department with a hemoglobin of 5.0.  He has been dyspneic for multiple days.  No anterior chest pain.  Status post myocardial infarction 07/23/2018 and present Rx Brilinta.  Medical history includes Crohn's disease, CAD, obesity, hypertension, hyperlipidemia, several others.  Review of systems negative for black stool.     Past Medical History:  Diagnosis Date  . Acute upper respiratory infections of unspecified site   . Allergic rhinitis, cause unspecified   . Anal stenosis   . Asthma   . Cataract   . Coronary artery disease involving native coronary artery of native heart without angina pectoris     p-mRCA 100% (DES PCI - p-mRCA 100% (DES PCI - Resolute Onyx 3.5 x 26 --> ~3.7 mm)), dRCA ~50% & PAV 40%; pRI 85% (Staged DES PCI  Resolute Onyx 2.25 x 15 - ~2.5 mm). 100% LAD CTO after small D1 with ost-prox 60%. R-L collaterals to LAD seen post PCI.  Marland Kitchen Crohn's colitis ([place])   . Dizziness and giddiness    Related to borderline hypotension  . Exostosis of unspecified site   . GERD (gastroesophageal reflux disease)   . History of acute inferior wall MI 06/2018   100% p-mRCA -- DES PCI (complicated by mild cardiogenic shock, partially related to 100% LAD CTO) --> staged PCI-RI  . Hyperlipidemia with target LDL less than 70 07/04/2015   Now s/p MI with MV CAD - optimal LDL < 50  . Hyperplastic colon polyp 02/26/2008  . Hypertension   . Lateral epicondylitis  of elbow   . Nontraumatic rupture of patellar tendon   . Obesity, unspecified   . Regional enteritis of unspecified site   . Seasonal allergies   . Sessile colonic  polyp   . Vitamin B12 deficiency     Patient Active Problem List   Diagnosis Date Noted  . Hypotension 08/18/2018  . Coronary artery disease involving native coronary artery of native heart without angina pectoris   . Acute ST elevation myocardial infarction (STEMI) of inferior wall ([place]) 07/23/2018    Class: Hospitalized for  . CAD S/P percutaneous coronary angioplasty 07/23/2018  . Ischemic cardiomyopathy 07/23/2018  . Hyperlipidemia with target LDL less than 70 07/04/2015  . BPH associated with nocturia 07/04/2015  . Fear of flying 07/04/2015  . Essential hypertension, benign 12/01/2013  . Routine general medical examination at a health care facility 05/19/2013  . CROHN'S DISEASE, LARGE INTESTINE 04/27/2009  . Prediabetes 03/09/2008  . Vitamin B12 deficiency anemia 02/08/2008  . GERD 02/08/2008  . OBESITY, NOS 05/22/2006  . Asthma in adult, mild intermittent, uncomplicated 40/98/1191    Past Surgical History:  Procedure Laterality Date  . COLONOSCOPY     2016  . CORONARY STENT INTERVENTION N/A 07/24/2018   Procedure: CORONARY STENT INTERVENTION;  Surgeon: Troy Sine, MD;  Location: [facility];  Service: Cardiovascular: Staged PCI pRI 85-90%: Resolute Onyx DES 2.25 x 15 (2.5 mm)  . CORONARY/GRAFT ACUTE MI REVASCULARIZATION N/A 07/23/2018   Procedure: CORONARY/GRAFT ACUTE MI REVASCULARIZATION;  Surgeon: [name], MD;  Location: [facility];  Service: Cardiovascular;::  p-mRCA 100% (DES PCI - p-mRCA 100% (DES PCI - Resolute Onyx 3.5 x 26 (post-dilated ~3.7 mm)), dRCA ~50% & PAV 40%; pRI 85% (Staged PCI). 100% LAD CTO after small D1 with ost-prox 60%. R-L collaterals to LAD seen post PCI.;   . LEFT HEART CATH N/A 07/24/2018   Procedure: LEFT HEART CATH;  Surgeon: Troy Sine, MD;  Location: [facility];  Service: Cardiovascular;  Laterality: N/A; 85-90% pRCA, 60% ost D1 then 100% CTO of LAD -- staged DES PCI LAD  . LEFT HEART CATH AND CORONARY  ANGIOGRAPHY  07/23/2018   Procedure: LEFT HEART CATH;  Surgeon: [name]., MD;  Location: [facility]   p-mRCA 100% (DES PCI),dRCA ~50% & PAV 40%; pRI 85% (Staged PCI). 100% LAD CTO after small D1 with ost-prox 60%. R-L collaterals to LAD seen post PCI.;   . RIGHT HEART CATH N/A 07/23/2018   Procedure: RIGHT HEART CATH;  Surgeon: [name], MD;  Location: [facility];  Service: Cardiovascular;  Normal RHC #s, CO/CI normal  . TONSILLECTOMY AND ADENOIDECTOMY    . TRANSTHORACIC ECHOCARDIOGRAM  07/23/2018   Insetting of inferior MI with occluded LAD CTO        Home Medications    Prior to Admission medications   Medication Sig Start Date End Date Taking? Authorizing Provider  allopurinol (ZYLOPRIM) 100 MG tablet TAKE 1 TABLET ONCE DAILY. 08/19/18   Pyrtle, Lajuan Lines, MD  aspirin EC 81 MG EC tablet Take 1 tablet (81 mg total) by mouth daily. 07/26/18   Almyra Deforest, PA  Budesonide ER (UCERIS) 9 MG TB24 Take 1 tablet by mouth daily. 04/28/18   Pyrtle, Lajuan Lines, MD  Cholecalciferol (VITAMIN D3) 1000 units CAPS Take 1 capsule by mouth daily.    [provider]  fluticasone-salmeterol (ADVAIR HFA) 115-21 MCG/ACT inhaler USE 2 PUFFS TWICE DAILY AS NEEDED. SHAKE WELL 09/08/18   Janith Lima, MD  folic acid (FOLVITE) 1 MG tablet TAKE 1 TABLET ONCE DAILY. Patient taking differently: Take 1 mg by mouth daily.  11/14/16   Pyrtle, Lajuan Lines, MD  hydrocortisone 2.5 % cream  07/28/17   [provider]  loperamide (IMODIUM A-D) 2 MG tablet Take 2 mg by mouth 4 (four) times daily as needed for diarrhea or loose stools.    [provider]  mercaptopurine (PURINETHOL) 50 MG tablet TAKE 1&1/2 TABLETS ONCE DAILY. 09/21/18   Pyrtle, Lajuan Lines, MD  metoprolol tartrate (LOPRESSOR) 25 MG tablet Take 1 tablet (25 mg total) by mouth once for 1 dose. TAKE THE MORNING OF MRI 09/02/18 09/02/18  Lendon Colonel, NP  midodrine (PROAMATINE) 2.5 MG tablet Take 2.5 mg every morning , may take an  additional dose after 6 hr if still dizzy or for systloic BP less than 95 bpm with dizziness. 08/18/18   [name], MD  montelukast (SINGULAIR) 10 MG tablet TAKE ONE TABLET AT BEDTIME. Patient taking differently: Take 10 mg by mouth at bedtime.  04/13/18   Janith Lima, MD  Multiple Vitamins-Minerals (CENTRUM SILVER PO) Take 1 tablet by mouth daily.    [provider]  nitroGLYCERIN (NITROSTAT) 0.4 MG SL tablet Place 1 tablet (0.4 mg total) under the tongue every 5 (five) minutes x 3 doses as needed for chest pain. 07/25/18   Almyra Deforest, PA  Omega-3 Fatty Acids (DIALYVITE OMEGA-3 CONCENTRATE) 600 MG CAPS Take 1 tablet by mouth 2 (two) times  daily.    [provider]  pantoprazole (PROTONIX) 40 MG tablet Take 1 tablet (40 mg total) by mouth daily. 09/08/18   Janith Lima, MD  rosuvastatin (CRESTOR) 40 MG tablet Take 1 tablet (40 mg total) by mouth daily. 07/25/18   Almyra Deforest, PA  ticagrelor (BRILINTA) 90 MG TABS tablet Take 1 tablet (90 mg total) by mouth 2 (two) times daily. 07/25/18   Almyra Deforest, PA  traMADol (ULTRAM) 50 MG tablet TAKE ONE TABLET EVERY 6 HOURS AS NEEDED. 04/28/18   Janith Lima, MD  VENTOLIN HFA 108 (90 Base) MCG/ACT inhaler USE 2 PUFFS EVERY 6 HOURS AS NEEDED FOR WHEEZING. **SHAKE WELL** 09/08/18   Janith Lima, MD  vitamin B-12 (CYANOCOBALAMIN) 1000 MCG tablet Take 1 tablet (1,000 mcg total) by mouth daily. 08/11/13   Pyrtle, Lajuan Lines, MD    Family History Family History  Problem Relation Age of Onset  . Diabetes Brother   . Diabetes Father   . Heart attack Father   . Hypertension Father   . Heart disease Father   . Colon cancer Neg Hx   . Stomach cancer Neg Hx   . Hyperlipidemia Neg Hx   . Sudden death Neg Hx   . Cancer Neg Hx   . COPD Neg Hx   . Alcohol abuse Neg Hx   . Drug abuse Neg Hx   . Stroke Neg Hx   . Rectal cancer Neg Hx   . Esophageal cancer Neg Hx     Social History Social History   Tobacco Use  . Smoking status: Never Smoker   . Smokeless tobacco: Never Used  Substance Use Topics  . Alcohol use: Yes    Alcohol/week: 0.0 standard drinks    Comment: rare  . Drug use: No     Allergies   Asacol [mesalamine], Penicillins, Amoxicillin, and Sulfamethoxazole   Review of Systems Review of Systems  Unable to perform ROS: Acuity of condition     Physical Exam Updated Vital Signs BP 122/75 (BP Location: Right Arm)   Pulse 85   Temp 98 F (36.7 C) (Oral)   Resp 18   SpO2 100%   Physical Exam Vitals signs and nursing note reviewed.  Constitutional:      Appearance: He is well-developed.  HENT:     Head: Normocephalic and atraumatic.  Eyes:     Conjunctiva/sclera: Conjunctivae normal.  Neck:     Musculoskeletal: Neck supple.  Cardiovascular:     Rate and Rhythm: Normal rate and regular rhythm.  Pulmonary:     Effort: Pulmonary effort is normal.     Breath sounds: Normal breath sounds.  Abdominal:     General: Bowel sounds are normal.     Palpations: Abdomen is soft.  Genitourinary:    Comments: Rectal exam: No masses.  No black stool.  Heme-negative. Musculoskeletal: Normal range of motion.  Skin:    General: Skin is warm and dry.  Neurological:     Mental Status: He is alert and oriented to person, place, and time.  Psychiatric:        Behavior: Behavior normal.      ED Treatments / Results  Labs (all labs ordered are listed, but only abnormal results are displayed) Labs Reviewed  COMPREHENSIVE METABOLIC PANEL - Abnormal; Notable for the following components:      Result Value   Chloride 114 (*)    CO2 20 (*)    Glucose, Bld 111 (*)  Creatinine, Ser 1.35 (*)    GFR calc non Af Amer 53 (*)    All other components within normal limits  CBC - Abnormal; Notable for the following components:   WBC 2.7 (*)    RBC 1.17 (*)    Hemoglobin 4.8 (*)    HCT 13.3 (*)    MCV 113.7 (*)    MCH 41.0 (*)    MCHC 36.1 (*)    All other components within normal limits  POC OCCULT BLOOD, ED   TYPE AND SCREEN  ABO/RH    EKG None  Radiology No results found.  Procedures Procedures (including critical care time)  Medications Ordered in ED Medications  sodium chloride 0.9 % bolus 500 mL (has no administration in time range)     Initial Impression / Assessment and Plan / ED Course  I have reviewed the triage vital signs and the nursing notes.  Pertinent labs & imaging results that were available during my care of the patient were reviewed by me and considered in my medical decision making (see chart for details).  Clinical Course as of Sep 28 1598  Mon Sep 21, 2018  0388 Spoke with Dr. Maudie Mercury who will admit pt. Appreciate his involvement.    [AM]    Clinical Course User Index [AM] Albesa Seen, PA-C       Patient presents with a hemoglobin of 5 per his gastroenterologist.  He is hemodynamically stable.  Possible sources of his anemia include bleeding secondary to Brilinta or Crohn's disease.  Will recheck labs, administer blood, admit.   CRITICAL CARE Performed by: Nat Christen Total critical care time: 30 minutes Critical care time was exclusive of separately billable procedures and treating other patients. Critical care was necessary to treat or prevent imminent or life-threatening deterioration. Critical care was time spent personally by me on the following activities: development of treatment plan with patient and/or surrogate as well as nursing, discussions with consultants, evaluation of patient's response to treatment, examination of patient, obtaining history from patient or surrogate, ordering and performing treatments and interventions, ordering and review of laboratory studies, ordering and review of radiographic studies, pulse oximetry and re-evaluation of patient's condition.  Final Clinical Impressions(s) / ED Diagnoses   Final diagnoses:  Anemia, unspecified type    ED Discharge Orders    None       Nat Christen, MD 09/21/18 Junious Dresser     Nat Christen, MD 09/28/18 [phone]

## 2018-09-21 NOTE — Telephone Encounter (Signed)
Received call report from lab  Hgb 5 Hct 13.9

## 2018-09-21 NOTE — H&P (Signed)
TRH H&P    Patient Demographics:    Darryl [name], is a 68 y.o. male  MRN: [id number]  DOB - 04/03/1950  Admit Date - 09/21/2018  Referring MD/NP/PA:  Nat Christen  Outpatient Primary MD for the patient is Janith Lima, MD [place]- cardiology Shelva Majestic - cardiology  Patient coming from:  home  Chief complaint-  Dyspnea   HPI:    Darryl [name]  is a 68 y.o. male,  w CAD with recent NSTEMI (peak trop 24.25)  s/p DES prox ramus 07/24/18, w prior DES mid RCA , Asthma, Gerd,  Crohns disease, Vitamin B12 deficiency, apparently presents with dyspnea on exertion and lightheadedness. Pt had Hgb checked by GI and told 5.0 and sent to ER  Pt denies cp, palp, n/v, abd pain, diarrhea , brbpr, black stool.  Pt had rectal and no melana and stool was FOBT negative. Pt is currently on brillinta and aspirin.   Pt will be admitted for symptomatic anemia  In ED,  T 98.5, P 76 R 16 Bp 143/118 pox 87-100% on RA Wt 79.4kg  FOBT negative Wbc 2.7, hgb 4.8, Plt 162  Mcv 113.7, RDW 15 Na 141, K 4.0, Bun 19, Creatinine 1.35 Ast 24, Alt 24  Glucose 111  covid -19 negative  Pt given protonix 35m iv x1 in ED,     Review of systems:    In addition to the HPI above,  No Fever-chills, No Headache, No changes with Vision or hearing, No problems swallowing food or Liquids, No Chest pain, no Cough  No Abdominal pain, No Nausea or Vomiting, bowel movements are regular, No Blood in stool or Urine, No dysuria, No new skin rashes or bruises, No new joints pains-aches,  No new weakness, tingling, numbness in any extremity, No recent weight gain or loss, No polyuria, polydypsia or polyphagia, No significant Mental Stressors.  All other systems reviewed and are negative.    Past History of the following :    Past Medical History:  Diagnosis Date   Acute upper respiratory infections of  unspecified site    Allergic rhinitis, cause unspecified    Anal stenosis    Asthma    Cataract    Coronary artery disease involving native coronary artery of native heart without angina pectoris     p-mRCA 100% (DES PCI - p-mRCA 100% (DES PCI - Resolute Onyx 3.5 x 26 --> ~3.7 mm)), dRCA ~50% & PAV 40%; pRI 85% (Staged DES PCI  Resolute Onyx 2.25 x 15 - ~2.5 mm). 100% LAD CTO after small D1 with ost-prox 60%. R-L collaterals to LAD seen post PCI.   [place]'s colitis [place]    Dizziness and giddiness    Related to borderline hypotension   Exostosis of unspecified site    GERD (gastroesophageal reflux disease)    History of acute inferior wall MI 06/2018   100% p-mRCA -- DES PCI (complicated by mild cardiogenic shock, partially related to 100% LAD CTO) --> staged PCI-RI   Hyperlipidemia with target LDL  less than 70 07/04/2015   Now s/p MI with MV CAD - optimal LDL < 50   Hyperplastic colon polyp 02/26/2008   Hypertension    Lateral epicondylitis  of elbow    Nontraumatic rupture of patellar tendon    Obesity, unspecified    Regional enteritis of unspecified site    Seasonal allergies    Sessile colonic polyp    Vitamin B12 deficiency       Past Surgical History:  Procedure Laterality Date   COLONOSCOPY     2016   CORONARY STENT INTERVENTION N/A 07/24/2018   Procedure: CORONARY STENT INTERVENTION;  Surgeon: Troy Sine, MD;  Location: [facility];  Service: Cardiovascular: Staged PCI pRI 85-90%: Resolute Onyx DES 2.25 x 15 (2.5 mm)   CORONARY/GRAFT ACUTE MI REVASCULARIZATION N/A 07/23/2018   Procedure: CORONARY/GRAFT ACUTE MI REVASCULARIZATION;  Surgeon: [name], MD;  Location: [facility];  Service: Cardiovascular;::  p-mRCA 100% (DES PCI - p-mRCA 100% (DES PCI - Resolute Onyx 3.5 x 26 (post-dilated ~3.7 mm)), dRCA ~50% & PAV 40%; pRI 85% (Staged PCI). 100% LAD CTO after small D1 with ost-prox 60%. R-L collaterals to LAD seen post PCI.;     LEFT HEART CATH N/A 07/24/2018   Procedure: LEFT HEART CATH;  Surgeon: Troy Sine, MD;  Location: [facility];  Service: Cardiovascular;  Laterality: N/A; 85-90% pRCA, 60% ost D1 then 100% CTO of LAD -- staged DES PCI LAD   LEFT HEART CATH AND CORONARY ANGIOGRAPHY  07/23/2018   Procedure: LEFT HEART CATH;  Surgeon: [name]., MD;  [facility]: [facility]   p-mRCA 100% (DES PCI),dRCA ~50% & PAV 40%; pRI 85% (Staged PCI). 100% LAD CTO after small D1 with ost-prox 60%. R-L collaterals to LAD seen post PCI.;    RIGHT HEART CATH N/A 07/23/2018   Procedure: RIGHT HEART CATH;  Surgeon: [name], MD;  Location: [facility];  Service: Cardiovascular;  Normal RHC #s, CO/CI normal   TONSILLECTOMY AND ADENOIDECTOMY     TRANSTHORACIC ECHOCARDIOGRAM  07/23/2018   Insetting of inferior MI with occluded LAD CTO      Social History:      Social History   Tobacco Use   Smoking status: Never Smoker   Smokeless tobacco: Never Used  Substance Use Topics   Alcohol use: Yes    Alcohol/week: 0.0 standard drinks    Comment: rare       Family History :     Family History  Problem Relation Age of Onset   Diabetes Brother    Diabetes Father    Heart attack Father    Hypertension Father    Heart disease Father    Colon cancer Neg Hx    Stomach cancer Neg Hx    Hyperlipidemia Neg Hx    Sudden death Neg Hx    Cancer Neg Hx    COPD Neg Hx    Alcohol abuse Neg Hx    Drug abuse Neg Hx    Stroke Neg Hx    Rectal cancer Neg Hx    Esophageal cancer Neg Hx        Home Medications:   Prior to Admission medications   Medication Sig Start Date End Date Taking? Authorizing Provider  allopurinol (ZYLOPRIM) 100 MG tablet TAKE 1 TABLET ONCE DAILY. 08/19/18  Yes Pyrtle, Lajuan Lines, MD  Budesonide ER (UCERIS) 9 MG TB24 Take 1 tablet by mouth daily. 04/28/18  Yes Pyrtle, Lajuan Lines, MD  Cholecalciferol (VITAMIN D3) 1000 units CAPS Take 1 capsule by mouth  daily.   Yes [provider]  fluticasone-salmeterol (ADVAIR HFA) 115-21 MCG/ACT inhaler USE 2 PUFFS TWICE DAILY AS NEEDED. SHAKE WELL Patient taking differently: Inhale 2 puffs into the lungs 2 (two) times daily.  09/08/18  Yes Janith Lima, MD  folic acid (FOLVITE) 1 MG tablet TAKE 1 TABLET ONCE DAILY. Patient taking differently: Take 1 mg by mouth daily.  11/14/16  Yes Pyrtle, Lajuan Lines, MD  hydrocortisone 2.5 % cream Apply 1 application topically daily.  07/28/17  Yes [provider]  loperamide (IMODIUM A-D) 2 MG tablet Take 2 mg by mouth 4 (four) times daily as needed for diarrhea or loose stools.   Yes [provider]  mercaptopurine (PURINETHOL) 50 MG tablet TAKE 1&1/2 TABLETS ONCE DAILY. Patient taking differently: Take 75 mg by mouth daily.  09/21/18  Yes Pyrtle, Lajuan Lines, MD  midodrine (PROAMATINE) 2.5 MG tablet Take 2.5 mg every morning , may take an additional dose after 6 hr if still dizzy or for systloic BP less than 95 bpm with dizziness. 08/18/18  Yes [name], MD  montelukast (SINGULAIR) 10 MG tablet TAKE ONE TABLET AT BEDTIME. Patient taking differently: Take 10 mg by mouth at bedtime.  04/13/18  Yes Janith Lima, MD  Multiple Vitamins-Minerals (CENTRUM SILVER PO) Take 1 tablet by mouth daily.   Yes [provider]  nitroGLYCERIN (NITROSTAT) 0.4 MG SL tablet Place 1 tablet (0.4 mg total) under the tongue every 5 (five) minutes x 3 doses as needed for chest pain. 07/25/18  Yes Meng, Isaac Laud, PA  Omega-3 Fatty Acids (DIALYVITE OMEGA-3 CONCENTRATE) 600 MG CAPS Take 1 tablet by mouth 2 (two) times daily.   Yes [provider]  pantoprazole (PROTONIX) 40 MG tablet Take 1 tablet (40 mg total) by mouth daily. 09/08/18  Yes Janith Lima, MD  rosuvastatin (CRESTOR) 40 MG tablet Take 1 tablet (40 mg total) by mouth daily. 07/25/18  Yes Almyra Deforest, PA  ticagrelor (BRILINTA) 90 MG TABS tablet Take 1 tablet (90 mg total) by mouth 2 (two) times daily.  07/25/18  Yes Meng, Isaac Laud, PA  traMADol (ULTRAM) 50 MG tablet TAKE ONE TABLET EVERY 6 HOURS AS NEEDED. Patient taking differently: Take 50 mg by mouth every 6 (six) hours as needed (pain).  04/28/18  Yes Janith Lima, MD  VENTOLIN HFA 108 (90 Base) MCG/ACT inhaler USE 2 PUFFS EVERY 6 HOURS AS NEEDED FOR WHEEZING. **SHAKE WELL** Patient taking differently: Inhale 2 puffs into the lungs every 6 (six) hours as needed for wheezing.  09/08/18  Yes Janith Lima, MD  vitamin B-12 (CYANOCOBALAMIN) 1000 MCG tablet Take 1 tablet (1,000 mcg total) by mouth daily. 08/11/13  Yes Pyrtle, Lajuan Lines, MD  metoprolol tartrate (LOPRESSOR) 25 MG tablet Take 1 tablet (25 mg total) by mouth once for 1 dose. TAKE THE MORNING OF MRI 09/02/18 09/21/18  Lendon Colonel, NP     Allergies:     Allergies  Allergen Reactions   Asacol [Mesalamine] Hives    Trouble swallowing, loss of appetite   Amoxicillin Rash    Did it involve swelling of the face/tongue/throat, SOB, or low BP? Unknown Did it involve sudden or severe rash/hives, skin peeling, or any reaction on the inside of your mouth or nose? Unknown Did you need to seek medical attention at a hospital or doctor's office? Unknown When did it last  happen?b/c of penicillin allergy If all above answers are NO, may proceed with cephalosporin use.    Penicillins Rash    Did it involve swelling of the face/tongue/throat, SOB, or low BP? Yes Did it involve sudden or severe rash/hives, skin peeling, or any reaction on the inside of your mouth or nose? No Did you need to seek medical attention at a hospital or doctor's office? No When did it last happen?more than 15 yrs If all above answers are NO, may proceed with cephalosporin use.    Sulfamethoxazole Rash     Physical Exam:   Vitals  Blood pressure 122/75, pulse 85, temperature 98 F (36.7 C), temperature source Oral, resp. rate 18, height 5' 6.5" (1.689 m), weight 79.4 kg, SpO2 100 %.  1.   General: axox3  2. Psychiatric: euthymic  3. Neurologic: cn2-12 intact, reflexes 2+ symmetric diffuse with no clonus, motor 5/5 in all 4 ext  4. HEENMT:  Anicteric, pale conjuctiva,  pupils 1.77m symmetric, direct, consensual, near intact Neck: no jvd, no bruit  5. Respiratory : CTAB  6. Cardiovascular : rrr s1, s2,   7. Gastrointestinal:  Abdomen: soft, nt, nd, +bs  8. Skin:  Ext: no c/c/e, no rash  9.Musculoskeletal:  Good ROM  No adenopathy    Data Review:    CBC Recent Labs  Lab 09/21/18 1156 09/21/18 1633  WBC 3.0* 2.7*  HGB 5.0 Repeated and verified X2.* 4.8*  HCT 13.9 Repeated and verified X2.* 13.3*  PLT 179.0 162  MCV 113.9 Repeated and verified X2.* 113.7*  MCH  --  41.0*  MCHC 36.0 36.1*  RDW 16.3* 15.0  LYMPHSABS 1.2  --   MONOABS 0.1  --   EOSABS 0.0  --   BASOSABS 0.0  --    ------------------------------------------------------------------------------------------------------------------  Results for orders placed or performed during the hospital encounter of 09/21/18 (from the past 48 hour(s))  Type and screen [place]    Status: None (Preliminary result)   Collection Time: 09/21/18  4:30 PM  Result Value Ref Range   ABO/RH(D) O POS    Antibody Screen NEG    Sample Expiration 09/24/2018,2359    Unit Number [id number]   Blood Component Type RCLI PHER 2    Unit division 00    Status of Unit ISSUED    Transfusion Status OK TO TRANSFUSE    Crossmatch Result      Compatible Performed at [facility] [id number], [place] [address]   Unit Number [id number]   Blood Component Type RED CELLS,LR    Unit division 00    Status of Unit ALLOCATED    Transfusion Status OK TO TRANSFUSE    Crossmatch Result Compatible   ABO/Rh     Status: None (Preliminary result)   Collection Time: 09/21/18  4:30 PM  Result Value Ref Range   ABO/RH(D)      O POS Performed at [facility] [address], [place] [address]  Comprehensive metabolic panel     Status: Abnormal   Collection Time: 09/21/18  4:33 PM  Result Value Ref Range   Sodium 141 135 - 145 mmol/L   Potassium 4.0 3.5 - 5.1 mmol/L   Chloride 114 (H) 98 - 111 mmol/L   CO2 20 (L) 22 - 32 mmol/L   Glucose, Bld 111 (H) 70 - 99 mg/dL   BUN 19 8 - 23 mg/dL   Creatinine,  Ser 1.35 (H) 0.61 - 1.24 mg/dL   Calcium 9.6 8.9 - 10.3 mg/dL   Total Protein 6.8 6.5 - 8.1 g/dL   Albumin 4.0 3.5 - 5.0 g/dL   AST 24 15 - 41 U/L   ALT 24 0 - 44 U/L   Alkaline Phosphatase 75 38 - 126 U/L   Total Bilirubin 0.9 0.3 - 1.2 mg/dL   GFR calc non Af Amer 53 (L) >60 mL/min   GFR calc Af Amer >60 >60 mL/min   Anion gap 7 5 - 15    Comment: Performed at [facility] [address] [place] [address]  CBC     Status: Abnormal   Collection Time: 09/21/18  4:33 PM  Result Value Ref Range   WBC 2.7 (L) 4.0 - 10.5 K/uL   RBC 1.17 (L) 4.22 - 5.81 MIL/uL   Hemoglobin 4.8 (LL) 13.0 - 17.0 g/dL    Comment: REPEATED TO VERIFY THIS CRITICAL RESULT HAS VERIFIED AND BEEN CALLED TO C.PREUETT RN BY KATHERINE MCCORMICK ON 06 29 2020 AT 7510, AND HAS BEEN READ BACK.  CORRECTED ON 06/29 AT 1714: PREVIOUSLY REPORTED AS 4.8 REPEATED TO VERIFY THIS CRITICAL RESULT HAS VERIFIED AND BEEN CALLED TO C.PREUETT BY KATHERINE MCCORMICK ON 06 29 2020 AT 1713, AND HAS BEEN READ BACK.     HCT 13.3 (L) 39.0 - 52.0 %   MCV 113.7 (H) 80.0 - 100.0 fL   MCH 41.0 (H) 26.0 - 34.0 pg   MCHC 36.1 (H) 30.0 - 36.0 g/dL   RDW 15.0 11.5 - 15.5 %   Platelets 162 150 - 400 K/uL   nRBC 0.0 0.0 - 0.2 %    Comment: Performed at [facility] [address] [place], [place] [address]  POC occult blood, ED     Status: None   Collection Time: 09/21/18  6:31 PM  Result Value Ref Range   Fecal Occult Bld NEGATIVE NEGATIVE  Prepare RBC     Status: None   Collection Time: 09/21/18  6:54 PM  Result Value Ref Range   Order Confirmation      ORDER PROCESSED  BY BLOOD BANK Performed at [facility], [place] [address] [place], [place] [address]     Chemistries  Recent Labs  Lab 09/21/18 1156 09/21/18 1633  NA 141 141  K 3.8 4.0  CL 110 114*  CO2 20 20*  GLUCOSE 110* 111*  BUN 19 19  CREATININE 1.27 1.35*  CALCIUM 9.5 9.6  AST 19 24  ALT 18 24  ALKPHOS 73 75  BILITOT 0.7 0.9   ------------------------------------------------------------------------------------------------------------------  ------------------------------------------------------------------------------------------------------------------ GFR: Estimated Creatinine Clearance: 51.7 mL/min (A) (by C-G formula based on SCr of 1.35 mg/dL (H)). Liver Function Tests: Recent Labs  Lab 09/21/18 1156 09/21/18 1633  AST 19 24  ALT 18 24  ALKPHOS 73 75  BILITOT 0.7 0.9  PROT 7.0 6.8  ALBUMIN 4.4 4.0   No results for input(s): LIPASE, AMYLASE in the last 168 hours. No results for input(s): AMMONIA in the last 168 hours. Coagulation Profile: No results for input(s): INR, PROTIME in the last 168 hours. Cardiac Enzymes: No results for input(s): CKTOTAL, CKMB, CKMBINDEX, TROPONINI in the last 168 hours. BNP (last 3 results) No results for input(s): PROBNP in the last 8760 hours. HbA1C: No results for input(s): HGBA1C in the last 72 hours. CBG: No results for input(s): GLUCAP in the last 168 hours. Lipid Profile: No results for input(s):  CHOL, HDL, LDLCALC, TRIG, CHOLHDL, LDLDIRECT in the last 72 hours. Thyroid Function Tests: No results for input(s): TSH, T4TOTAL, FREET4, T3FREE, THYROIDAB in the last 72 hours. Anemia Panel: Recent Labs    09/21/18 1156  VITAMINB12 829  FOLATE >23.9  FERRITIN >1500.0*  IRON 282*    --------------------------------------------------------------------------------------------------------------- Urine analysis:    Component Value Date/Time   COLORURINE YELLOW 07/30/2016 1021   APPEARANCEUR CLEAR 07/30/2016 1021   LABSPEC  1.010 07/30/2016 1021   PHURINE 6.5 07/30/2016 1021   GLUCOSEU NEGATIVE 07/30/2016 1021   HGBUR NEGATIVE 07/30/2016 1021   BILIRUBINUR NEGATIVE 07/30/2016 1021   KETONESUR NEGATIVE 07/30/2016 1021   UROBILINOGEN 0.2 07/30/2016 1021   NITRITE NEGATIVE 07/30/2016 1021   LEUKOCYTESUR NEGATIVE 07/30/2016 1021      Imaging Results:    No results found.     Assessment & Plan:    Principal Problem:   Anemia Active Problems:   CROHN'S DISEASE, LARGE INTESTINE   Prediabetes   Essential hypertension, benign   Hyperlipidemia with target LDL less than 70   CAD S/P percutaneous coronary angioplasty  Symptomatic macrocytic anemia HOLD Azathioprine  HOLD Allopurinol Transfuse 2 units prbc Cont Folic acid 44m po qday Cont vitamin b12  Check cbc in am Please consult [facility] in AM regarding anemia   Asthma Cont Singulair 183mpo qday Cont Advair 2puff bid => Dulera 2puff bid Cont Albuterol HFA 2puff q6h prn   Gerd Cont PPI  CAD  Cont Brillinta [id number] bid Cont Crestor 24m35m qday Probably not able to tolerate b blocker due to orthostatic hypotension  Orthostatic hypotension Cont Midodrine 2.5mg 81mqday  Crohns disease HOLD Azathioprine/ Allopurinol as above Cont Budesonide 9mg p16mday    DVT Prophylaxis-   Lovenox - SCDs   AM Labs Ordered, also please review Full Orders  Family Communication: Admission, patients condition and plan of care including tests being ordered have been discussed with the patient and who indicate understanding and agree with the plan and Code Status.  Code Status:  FULL CODE, d/w  [facility]   Admission status: Observationt: Based on patients clinical presentation and evaluation of above clinical data, I have made determination that patient meets observation criteria at this time.  Time spent in minutes : 70   Tywone Bembenek Jani Graveln 09/21/2018 at 7:57 PM

## 2018-09-21 NOTE — Telephone Encounter (Signed)
Returned call to pt he states that he is having DOE, paleness, dizziness and fatigue mostly in the morning but periodically during the day. He states that these sx "stay with him" most of the day,When he becomes DOE he will sit down and recover for a few minutes then he is able to continue. He states that his BP is ok and is running 103-110/60-65. He states that he was recently dx with anemia and Chrohn's dz  and is due to have lab work next week. He is concerned that he may now have CHF.

## 2018-09-21 NOTE — Telephone Encounter (Signed)
We need to have this redrawn STAT If this is correct, he will need to go to the ER for severely low Hgb (as he would need transfusion)

## 2018-09-21 NOTE — ED Notes (Signed)
Report given to Jeani Hawking, [place]

## 2018-09-21 NOTE — ED Provider Notes (Signed)
Physical Exam  BP 122/75 (BP Location: Right Arm)   Pulse 85   Temp 98 F (36.7 C) (Oral)   Resp 18   Ht 5' 6.5" (1.689 m)   Wt 79.4 kg   SpO2 100%   BMI 27.82 kg/m   Assumed care from Dr. Lacinda Axon at [phone]. Briefly, the patient is a 68 y.o. male with PMHx of  has a past medical history of Acute upper respiratory infections of unspecified site, Allergic rhinitis, cause unspecified, Anal stenosis, Asthma, Cataract, Coronary artery disease involving native coronary artery of native heart without angina pectoris, Crohn's colitis ([place]), Dizziness and giddiness, Exostosis of unspecified site, GERD (gastroesophageal reflux disease), History of acute inferior wall MI (06/2018), Hyperlipidemia with target LDL less than 70 (07/04/2015), Hyperplastic colon polyp (02/26/2008), Hypertension, Lateral epicondylitis  of elbow, Nontraumatic rupture of patellar tendon, Obesity, unspecified, Regional enteritis of unspecified site, Seasonal allergies, Sessile colonic polyp, and Vitamin B12 deficiency. here with low hemoglobin.   Labs Reviewed  COMPREHENSIVE METABOLIC PANEL - Abnormal; Notable for the following components:      Result Value   Chloride 114 (*)    CO2 20 (*)    Glucose, Bld 111 (*)    Creatinine, Ser 1.35 (*)    GFR calc non Af Amer 53 (*)    All other components within normal limits  CBC - Abnormal; Notable for the following components:   WBC 2.7 (*)    RBC 1.17 (*)    Hemoglobin 4.8 (*)    HCT 13.3 (*)    MCV 113.7 (*)    MCH 41.0 (*)    MCHC 36.1 (*)    All other components within normal limits  POC OCCULT BLOOD, ED  POC OCCULT BLOOD, ED  TYPE AND SCREEN  ABO/RH  PREPARE RBC (CROSSMATCH)    Course of Care:   Physical Exam Vitals signs and nursing note reviewed.  Constitutional:      General: He is not in acute distress.    Appearance: He is well-developed. He is not diaphoretic.     Comments: Sitting comfortably in bed.  HENT:     Head: Normocephalic and atraumatic.   Mouth/Throat:     Mouth: Mucous membranes are moist.  Eyes:     General:        Right eye: No discharge.        Left eye: No discharge.     Comments: EOMs normal to gross examination.  Pale conjunctiva.   Neck:     Musculoskeletal: Normal range of motion.  Cardiovascular:     Rate and Rhythm: Normal rate and regular rhythm.     Comments: Intact, 2+ radial pulse. Nontachycardic.  Abdominal:     General: There is no distension.  Musculoskeletal: Normal range of motion.  Skin:    General: Skin is warm and dry.  Neurological:     Mental Status: He is alert.     Comments: Cranial nerves intact to gross observation. Patient moves extremities without difficulty.  Psychiatric:        Behavior: Behavior normal.        Thought Content: Thought content normal.        Judgment: Judgment normal.     ED Course/Procedures   Clinical Course as of Sep 20 2013  Mon Sep 21, 2018  1957 Spoke with Dr. Maudie Mercury who will admit pt. Appreciate his involvement.    [AM]    Clinical Course User Index [AM] Albesa Seen, PA-C  Procedures  MDM   This is a 68 year old male past medical history of coronary artery disease on aspirin and Brilinta after MI April 30 of 2020 presenting for abnormal lab work with a hemoglobin of 5 primary medical office.  He also has a history of Crohn's disease.  Rectal exam performed by previous provider and he did not have melanotic stool.  Fecal occult blood was negative.  Given history of Crohn's and likely GI source, patient was started on Protonix in addition to his blood transfusion.  He is remaining hemodynamically stable.  Will admit to medicine.       Tamala Julian 09/21/18 2016    Nat Christen, MD 09/28/18 [phone]

## 2018-09-21 NOTE — ED Triage Notes (Signed)
Pt from [place] office with a c/o low hemoglobin. His hgb is 5.0 per his GI doctor. Pt reports SOB, lightheadedness/dizziness, and weakness since June 10th. No LOC. He denies blood in his stool. Pt had an MI on April 30th.

## 2018-09-22 ENCOUNTER — Encounter (HOSPITAL_COMMUNITY): Payer: Self-pay | Admitting: Internal Medicine

## 2018-09-22 DIAGNOSIS — D61818 Other pancytopenia: Secondary | ICD-10-CM | POA: Diagnosis present

## 2018-09-22 DIAGNOSIS — I110 Hypertensive heart disease with heart failure: Secondary | ICD-10-CM | POA: Diagnosis present

## 2018-09-22 DIAGNOSIS — I255 Ischemic cardiomyopathy: Secondary | ICD-10-CM | POA: Diagnosis not present

## 2018-09-22 DIAGNOSIS — D61811 Other drug-induced pancytopenia: Secondary | ICD-10-CM | POA: Diagnosis not present

## 2018-09-22 DIAGNOSIS — I2510 Atherosclerotic heart disease of native coronary artery without angina pectoris: Secondary | ICD-10-CM | POA: Diagnosis present

## 2018-09-22 DIAGNOSIS — K5000 Crohn's disease of small intestine without complications: Secondary | ICD-10-CM

## 2018-09-22 DIAGNOSIS — D539 Nutritional anemia, unspecified: Secondary | ICD-10-CM | POA: Diagnosis present

## 2018-09-22 DIAGNOSIS — E669 Obesity, unspecified: Secondary | ICD-10-CM | POA: Diagnosis present

## 2018-09-22 DIAGNOSIS — Z8719 Personal history of other diseases of the digestive system: Secondary | ICD-10-CM | POA: Diagnosis not present

## 2018-09-22 DIAGNOSIS — Z7902 Long term (current) use of antithrombotics/antiplatelets: Secondary | ICD-10-CM | POA: Diagnosis not present

## 2018-09-22 DIAGNOSIS — J45909 Unspecified asthma, uncomplicated: Secondary | ICD-10-CM | POA: Diagnosis present

## 2018-09-22 DIAGNOSIS — Z8249 Family history of ischemic heart disease and other diseases of the circulatory system: Secondary | ICD-10-CM | POA: Diagnosis not present

## 2018-09-22 DIAGNOSIS — I5022 Chronic systolic (congestive) heart failure: Secondary | ICD-10-CM | POA: Diagnosis present

## 2018-09-22 DIAGNOSIS — Z79899 Other long term (current) drug therapy: Secondary | ICD-10-CM | POA: Diagnosis not present

## 2018-09-22 DIAGNOSIS — E785 Hyperlipidemia, unspecified: Secondary | ICD-10-CM | POA: Diagnosis present

## 2018-09-22 DIAGNOSIS — Z79891 Long term (current) use of opiate analgesic: Secondary | ICD-10-CM | POA: Diagnosis not present

## 2018-09-22 DIAGNOSIS — I252 Old myocardial infarction: Secondary | ICD-10-CM | POA: Diagnosis not present

## 2018-09-22 DIAGNOSIS — R7303 Prediabetes: Secondary | ICD-10-CM | POA: Diagnosis present

## 2018-09-22 DIAGNOSIS — Z7951 Long term (current) use of inhaled steroids: Secondary | ICD-10-CM | POA: Diagnosis not present

## 2018-09-22 DIAGNOSIS — K219 Gastro-esophageal reflux disease without esophagitis: Secondary | ICD-10-CM | POA: Diagnosis present

## 2018-09-22 DIAGNOSIS — D649 Anemia, unspecified: Secondary | ICD-10-CM | POA: Diagnosis present

## 2018-09-22 DIAGNOSIS — I951 Orthostatic hypotension: Secondary | ICD-10-CM | POA: Diagnosis present

## 2018-09-22 DIAGNOSIS — Z955 Presence of coronary angioplasty implant and graft: Secondary | ICD-10-CM | POA: Diagnosis not present

## 2018-09-22 DIAGNOSIS — Z6827 Body mass index (BMI) 27.0-27.9, adult: Secondary | ICD-10-CM | POA: Diagnosis not present

## 2018-09-22 DIAGNOSIS — Z882 Allergy status to sulfonamides status: Secondary | ICD-10-CM | POA: Diagnosis not present

## 2018-09-22 DIAGNOSIS — Z833 Family history of diabetes mellitus: Secondary | ICD-10-CM | POA: Diagnosis not present

## 2018-09-22 DIAGNOSIS — Z880 Allergy status to penicillin: Secondary | ICD-10-CM | POA: Diagnosis not present

## 2018-09-22 DIAGNOSIS — K5010 Crohn's disease of large intestine without complications: Secondary | ICD-10-CM | POA: Diagnosis present

## 2018-09-22 DIAGNOSIS — Z1159 Encounter for screening for other viral diseases: Secondary | ICD-10-CM | POA: Diagnosis not present

## 2018-09-22 LAB — COMPREHENSIVE METABOLIC PANEL
ALT: 19 U/L (ref 0–44)
AST: 19 U/L (ref 15–41)
Albumin: 3.3 g/dL — ABNORMAL LOW (ref 3.5–5.0)
Alkaline Phosphatase: 57 U/L (ref 38–126)
Anion gap: 5 (ref 5–15)
BUN: 19 mg/dL (ref 8–23)
CO2: 20 mmol/L — ABNORMAL LOW (ref 22–32)
Calcium: 8.8 mg/dL — ABNORMAL LOW (ref 8.9–10.3)
Chloride: 114 mmol/L — ABNORMAL HIGH (ref 98–111)
Creatinine, Ser: 1.18 mg/dL (ref 0.61–1.24)
GFR calc Af Amer: >60 mL/min (ref >60)
GFR calc non Af Amer: >60 mL/min (ref >60)
Glucose, Bld: 102 mg/dL — ABNORMAL HIGH (ref 70–99)
Potassium: 4.2 mmol/L (ref 3.5–5.1)
Sodium: 139 mmol/L (ref 135–145)
Total Bilirubin: 1.3 mg/dL — ABNORMAL HIGH (ref 0.3–1.2)
Total Protein: 5.6 g/dL — ABNORMAL LOW (ref 6.5–8.1)

## 2018-09-22 LAB — CBC
HCT: 17.6 % — ABNORMAL LOW (ref 39.0–52.0)
Hemoglobin: 6.2 g/dL — CL (ref 13.0–17.0)
MCH: 36 pg — ABNORMAL HIGH (ref 26.0–34.0)
MCHC: 35.2 g/dL (ref 30.0–36.0)
MCV: 102.3 fL — ABNORMAL HIGH (ref 80.0–100.0)
Platelets: 130 10*3/uL — ABNORMAL LOW (ref 150–400)
RBC: 1.72 MIL/uL — ABNORMAL LOW (ref 4.22–5.81)
RDW: 18.6 % — ABNORMAL HIGH (ref 11.5–15.5)
WBC: 2.5 10*3/uL — ABNORMAL LOW (ref 4.0–10.5)
nRBC: 0 % (ref 0.0–0.2)

## 2018-09-22 LAB — PREPARE RBC (CROSSMATCH)

## 2018-09-22 LAB — HEMOGLOBIN AND HEMATOCRIT, BLOOD
HCT: 24.3 % — ABNORMAL LOW (ref 39.0–52.0)
Hemoglobin: 8.6 g/dL — ABNORMAL LOW (ref 13.0–17.0)

## 2018-09-22 LAB — LACTATE DEHYDROGENASE: LDH: 107 U/L (ref 98–192)

## 2018-09-22 LAB — PATHOLOGIST SMEAR REVIEW

## 2018-09-22 LAB — RETICULOCYTES
Immature Retic Fract: 7.6 % (ref 2.3–15.9)
RBC.: 1.75 MIL/uL — ABNORMAL LOW (ref 4.22–5.81)
Retic Count, Absolute: 12.1 10*3/uL — ABNORMAL LOW (ref 19.0–186.0)
Retic Ct Pct: 0.7 % (ref 0.4–3.1)

## 2018-09-22 MED ORDER — MONTELUKAST SODIUM 10 MG PO TABS
10.0000 mg | ORAL_TABLET | Freq: Every day | ORAL | Status: DC
  Administered 2018-09-22 (×2): 10 mg via ORAL
  Filled 2018-09-22 (×2): qty 1

## 2018-09-22 MED ORDER — TRAMADOL HCL 50 MG PO TABS: 50.0000 mg | ORAL_TABLET | Freq: Four times a day (QID) | ORAL | Status: DC | PRN

## 2018-09-22 MED ORDER — LOPERAMIDE HCL 2 MG PO CAPS: 2.0000 mg | ORAL_CAPSULE | Freq: Four times a day (QID) | ORAL | Status: DC | PRN

## 2018-09-22 MED ORDER — TICAGRELOR 90 MG PO TABS
90.0000 mg | ORAL_TABLET | Freq: Two times a day (BID) | ORAL | Status: DC
  Administered 2018-09-22 – 2018-09-23 (×4): 90 mg via ORAL
  Filled 2018-09-22 (×4): qty 1

## 2018-09-22 MED ORDER — FUROSEMIDE 10 MG/ML IJ SOLN
20.0000 mg | Freq: Once | INTRAMUSCULAR | Status: AC
  Administered 2018-09-22: 20 mg via INTRAVENOUS
  Filled 2018-09-22: qty 2

## 2018-09-22 MED ORDER — BUDESONIDE 3 MG PO CPEP
9.0000 mg | ORAL_CAPSULE | Freq: Every day | ORAL | Status: DC
  Administered 2018-09-22 – 2018-09-23 (×2): 9 mg via ORAL
  Filled 2018-09-22 (×2): qty 3

## 2018-09-22 MED ORDER — ALBUTEROL SULFATE (2.5 MG/3ML) 0.083% IN NEBU: 2.5000 mg | INHALATION_SOLUTION | Freq: Four times a day (QID) | RESPIRATORY_TRACT | Status: DC | PRN

## 2018-09-22 MED ORDER — FOLIC ACID 1 MG PO TABS
1.0000 mg | ORAL_TABLET | Freq: Every day | ORAL | Status: DC
  Administered 2018-09-22 – 2018-09-23 (×2): 1 mg via ORAL
  Filled 2018-09-22 (×2): qty 1

## 2018-09-22 MED ORDER — MIDODRINE HCL 5 MG PO TABS
2.5000 mg | ORAL_TABLET | Freq: Every day | ORAL | Status: DC
  Administered 2018-09-22 – 2018-09-23 (×2): 2.5 mg via ORAL
  Filled 2018-09-22 (×2): qty 1

## 2018-09-22 MED ORDER — ROSUVASTATIN CALCIUM 20 MG PO TABS
40.0000 mg | ORAL_TABLET | Freq: Every day | ORAL | Status: DC
  Administered 2018-09-22 – 2018-09-23 (×2): 40 mg via ORAL
  Filled 2018-09-22 (×2): qty 2

## 2018-09-22 MED ORDER — SODIUM CHLORIDE 0.9% IV SOLUTION: Freq: Once | INTRAVENOUS | Status: DC

## 2018-09-22 MED ORDER — VITAMIN B-12 1000 MCG PO TABS
1000.0000 ug | ORAL_TABLET | Freq: Every day | ORAL | Status: DC
  Administered 2018-09-22 – 2018-09-23 (×2): 1000 ug via ORAL
  Filled 2018-09-22 (×2): qty 1

## 2018-09-22 MED ORDER — MOMETASONE FURO-FORMOTEROL FUM 200-5 MCG/ACT IN AERO
2.0000 | INHALATION_SPRAY | Freq: Two times a day (BID) | RESPIRATORY_TRACT | Status: DC
  Administered 2018-09-22 – 2018-09-23 (×3): 2 via RESPIRATORY_TRACT
  Filled 2018-09-22: qty 8.8

## 2018-09-22 MED ORDER — ALBUTEROL SULFATE HFA 108 (90 BASE) MCG/ACT IN AERS: 2.0000 | INHALATION_SPRAY | Freq: Four times a day (QID) | RESPIRATORY_TRACT | Status: DC | PRN

## 2018-09-22 MED ORDER — PANTOPRAZOLE SODIUM 40 MG PO TBEC
40.0000 mg | DELAYED_RELEASE_TABLET | Freq: Every day | ORAL | Status: DC
  Administered 2018-09-22 – 2018-09-23 (×2): 40 mg via ORAL
  Filled 2018-09-22 (×2): qty 1

## 2018-09-22 NOTE — Consult Note (Addendum)
Cardiology Consultation:   Patient ID: Darryl [name] MRN: [id number]; DOB: 1951/01/24  Admit date: 09/21/2018 Date of Consult: 09/22/2018  Primary Care Provider: Janith Lima, MD Primary Cardiologist: Glenetta Hew, MD   Patient Profile:   Darryl [name] is a 68 y.o. male with a hx of CAD-Inferior STEMI 06/2018- DES RCA with CTO of LAD, hypotension, HLD, ICM, and chron's disease who is being seen today for the evaluation of antiplatelet management at the request of Dr. Broadus John.   The patient was admitted 07/23/2018 with inferior STEMI s/p DES to RCA and staged PCI of RI. He had CTO of LAD. Reduced EF on initial LV gram was notably improved on follow-up echocardiogram .Patient had cardiogenic shock related to initial RV infarct with transient significant bradycardia requiring initiation of atropine and dopamine followed by Levophed.  The patient was dealing with hypotension following discharge. Discontinued BB and ARB. Now on minodrine. Pending outpatient cardiac MR toI evaluate for anterior wall viability/ischemia for  warranting LAD PCI for longevity benefit. Stopped ASA 2nd to anemia.   History of Present Illness:   [name] sent to ER by gastroenterologist for hemoglobin of 5.  It was done for his routine checkup for his Crohn's disease treatment.  The patient has been dealing with exertional dyspnea for past 3 weeks.  Progressively worsened.  He used to walk 2 to 3 miles every day now he cannot walk due to dyspnea.  During walk he needed to take frequent stop.  No exertional chest pain.  His symptoms "totally different from his MI".  No dark stool, chest pain, palpitation, orthopnea, PND, syncope or lower extremity edema.  Upon arrival to ER he was found anemic with hemoglobin of 4.8 status post 2 units of PRBCs.  Hemoglobin improved to 6.2.  And to transfuse another 1 unit today.  His allopurinol and azathioprine has been held along with aspirin.  Continued Brilinta 90 mg twice daily.   Stool guaiac negative x2.  Patient's lab work concerning for underlying bone marrow disease.  Scr stable at 1.18.   Heart Pathway Score:     Past Medical History:  Diagnosis Date  . Acute upper respiratory infections of unspecified site   . Allergic rhinitis, cause unspecified   . Anal stenosis   . Asthma   . Cataract   . Coronary artery disease involving native coronary artery of native heart without angina pectoris     p-mRCA 100% (DES PCI - p-mRCA 100% (DES PCI - Resolute Onyx 3.5 x 26 --> ~3.7 mm)), dRCA ~50% & PAV 40%; pRI 85% (Staged DES PCI  Resolute Onyx 2.25 x 15 - ~2.5 mm). 100% LAD CTO after small D1 with ost-prox 60%. R-L collaterals to LAD seen post PCI.  Marland Kitchen Crohn's colitis ([place])   . Dizziness and giddiness    Related to borderline hypotension  . Exostosis of unspecified site   . GERD (gastroesophageal reflux disease)   . History of acute inferior wall MI 06/2018   100% p-mRCA -- DES PCI (complicated by mild cardiogenic shock, partially related to 100% LAD CTO) --> staged PCI-RI  . Hyperlipidemia with target LDL less than 70 07/04/2015   Now s/p MI with MV CAD - optimal LDL < 50  . Hyperplastic colon polyp 02/26/2008  . Hypertension   . Lateral epicondylitis  of elbow   . Nontraumatic rupture of patellar tendon   . Obesity, unspecified   . Regional enteritis of unspecified site   . Seasonal allergies   .  Sessile colonic polyp   . Vitamin B12 deficiency     Past Surgical History:  Procedure Laterality Date  . COLONOSCOPY     2016  . CORONARY STENT INTERVENTION N/A 07/24/2018   Procedure: CORONARY STENT INTERVENTION;  Surgeon: Troy Sine, MD;  Location: [facility];  Service: Cardiovascular: Staged PCI pRI 85-90%: Resolute Onyx DES 2.25 x 15 (2.5 mm)  . CORONARY/GRAFT ACUTE MI REVASCULARIZATION N/A 07/23/2018   Procedure: CORONARY/GRAFT ACUTE MI REVASCULARIZATION;  Surgeon: [name], MD;  Location: [facility];  Service: Cardiovascular;::   p-mRCA 100% (DES PCI - p-mRCA 100% (DES PCI - Resolute Onyx 3.5 x 26 (post-dilated ~3.7 mm)), dRCA ~50% & PAV 40%; pRI 85% (Staged PCI). 100% LAD CTO after small D1 with ost-prox 60%. R-L collaterals to LAD seen post PCI.;   . LEFT HEART CATH N/A 07/24/2018   Procedure: LEFT HEART CATH;  Surgeon: Troy Sine, MD;  Location: [facility];  Service: Cardiovascular;  Laterality: N/A; 85-90% pRCA, 60% ost D1 then 100% CTO of LAD -- staged DES PCI LAD  . LEFT HEART CATH AND CORONARY ANGIOGRAPHY  07/23/2018   Procedure: LEFT HEART CATH;  Surgeon: [name]., MD;  [facility]: [facility]   p-mRCA 100% (DES PCI),dRCA ~50% & PAV 40%; pRI 85% (Staged PCI). 100% LAD CTO after small D1 with ost-prox 60%. R-L collaterals to LAD seen post PCI.;   . RIGHT HEART CATH N/A 07/23/2018   Procedure: RIGHT HEART CATH;  Surgeon: [name], MD;  Location: [facility];  Service: Cardiovascular;  Normal RHC #s, CO/CI normal  . TONSILLECTOMY AND ADENOIDECTOMY    . TRANSTHORACIC ECHOCARDIOGRAM  07/23/2018   Insetting of inferior MI with occluded LAD CTO    Inpatient Medications: Scheduled Meds: . sodium chloride   Intravenous Once  . sodium chloride   Intravenous Once  . sodium chloride   Intravenous Once  . budesonide  9 mg Oral Daily  . folic acid  1 mg Oral Daily  . furosemide  20 mg Intravenous Once  . midodrine  2.5 mg Oral Daily  . mometasone-formoterol  2 puff Inhalation BID  . montelukast  10 mg Oral QHS  . pantoprazole  40 mg Oral Daily  . rosuvastatin  40 mg Oral Daily  . ticagrelor  90 mg Oral BID  . vitamin B-12  1,000 mcg Oral Daily   Continuous Infusions:  PRN Meds: acetaminophen **OR** acetaminophen, albuterol, loperamide, traMADol  Allergies:    Allergies  Allergen Reactions  . Asacol [Mesalamine] Hives    Trouble swallowing, loss of appetite  . Amoxicillin Rash    Did it involve swelling of the face/tongue/throat, SOB, or low BP? Unknown Did it involve  sudden or severe rash/hives, skin peeling, or any reaction on the inside of your mouth or nose? Unknown Did you need to seek medical attention at a hospital or doctor's office? Unknown When did it last happen?b/c of penicillin allergy If all above answers are "NO", may proceed with cephalosporin use.   Marland Kitchen Penicillins Rash    Did it involve swelling of the face/tongue/throat, SOB, or low BP? Yes Did it involve sudden or severe rash/hives, skin peeling, or any reaction on the inside of your mouth or nose? No Did you need to seek medical attention at a hospital or doctor's office? No When did it last happen?more than 15 yrs If all above answers are "NO", may proceed with cephalosporin  use.   . Sulfamethoxazole Rash    Social History:   Social History   Socioeconomic History  . Marital status: Married    Spouse name: Not on file  . Number of children: Not on file  . Years of education: Not on file  . Highest education level: Not on file  Occupational History  . Occupation: Optometrist  Social Needs  . Financial resource strain: Not on file  . Food insecurity    Worry: Not on file    Inability: Not on file  . Transportation needs    Medical: Not on file    Non-medical: Not on file  Tobacco Use  . Smoking status: Never Smoker  . Smokeless tobacco: Never Used  Substance and Sexual Activity  . Alcohol use: Yes    Alcohol/week: 0.0 standard drinks    Comment: rare  . Drug use: No  . Sexual activity: Yes  Lifestyle  . Physical activity    Days per week: Not on file    Minutes per session: Not on file  . Stress: Not on file  Relationships  . Social Herbalist on phone: Not on file    Gets together: Not on file    Attends religious service: Not on file    Active member of club or organization: Not on file    Attends meetings of clubs or organizations: Not on file    Relationship status: Not on file  . Intimate partner violence    Fear of current or ex  partner: Not on file    Emotionally abused: Not on file    Physically abused: Not on file    Forced sexual activity: Not on file  Other Topics Concern  . Not on file  Social History Narrative  . Not on file    Family History:   Family History  Problem Relation Age of Onset  . Diabetes Brother   . Diabetes Father   . Heart attack Father   . Hypertension Father   . Heart disease Father   . Colon cancer Neg Hx   . Stomach cancer Neg Hx   . Hyperlipidemia Neg Hx   . Sudden death Neg Hx   . Cancer Neg Hx   . COPD Neg Hx   . Alcohol abuse Neg Hx   . Drug abuse Neg Hx   . Stroke Neg Hx   . Rectal cancer Neg Hx   . Esophageal cancer Neg Hx      ROS:  Please see the history of present illness.  All other ROS reviewed and negative.     Physical Exam/Data:   Vitals:   09/22/18 0305 09/22/18 0313 09/22/18 0729 09/22/18 0855  BP: 111/67  108/73 119/71  Pulse: 70  68 74  Resp: 18  17   Temp: 98 F (36.7 C)  98.4 F (36.9 C)   TempSrc: Oral  Oral   SpO2: 99%  99% 100%  Weight:  79.8 kg    Height:        Intake/Output Summary (Last 24 hours) at 09/22/2018 1020 Last data filed at 09/22/2018 0315 Gross per 24 hour  Intake 1104 ml  Output -  Net 1104 ml   Last 3 Weights 09/22/2018 09/21/2018 09/21/2018  Weight (lbs) 175 lb 14.4 oz 174 lb 9.6 oz 175 lb  Weight (kg) 79.788 kg 79.198 kg 79.379 kg     Body mass index is 28.39 kg/m.  General:  Pale male  in  no acute distress HEENT: normal Lymph: no adenopathy Neck: no JVD Endocrine:  No thryomegaly Vascular: No carotid bruits; FA pulses 2+ bilaterally without bruits  Cardiac:  normal S1, S2; RRR; no murmur  Lungs:  clear to auscultation bilaterally, no wheezing, rhonchi or rales  Abd: soft, nontender, no hepatomegaly  Ext: no edema Musculoskeletal:  No deformities, BUE and BLE strength normal and equal Skin: warm and dry  Neuro:  CNs 2-12 intact, no focal abnormalities noted Psych:  Normal affect   EKG:  The EKG  09/02/2018 was personally reviewed and demonstrates:  SR with TWI in interior leads Telemetry:  Telemetry was personally reviewed and demonstrates:  SR at rate of 60s  Relevant CV Studies: Cath 07/23/2018  CULPRIT LESION: Prox RCA to Mid RCA lesion is 100% stenosed.  A drug-eluting stent was successfully placed using a STENT RESOLUTE ONYX 3.5X26. Deployed to 3.6-3.7 mm  Post intervention, there is a 0% residual stenosis.  Dist RCA lesion is 50% stenosed with 40% stenosed side branch in Post Atrio likely related to downstream spasm (not present after initial balloon inflation.  ---------------------------------------  Ost 1st Diag lesion is 60% stenosed.  Prox LAD lesion is 100% stenosed after 1st Diag & 1st Septal. -- likely CTO (R-L collaterals);  Ost Ramus to Ramus lesion is 85-90% stenosed. -- Would consider Staged PCI prior to discharge.  ----------------------------------------  There is mild to moderate left ventricular systolic dysfunction. The left ventricular ejection fraction is 35-45% by visual estimate.  LV end diastolic pressure is normal. And right heart cath pressures are essentially normal.  Hyperdynamic cardiac output on vasopressors. Indicates stable mild cardiogenic shock  SUMMARY  Severe three-vessel CAD:  Culprit lesion 100% occluded proximal to mid large dominant RCA (successful DES PCI using Resolute Onyx 3.5 mm x 26 mm--3.6 mm)  100% likely CTO of early proximal-mid LAD (after D1 and SP1) with right to left collaterals --> will need to assess for anterior wall viability once convalescence from MI is complete  85 to 90% proximal large branching Ramus Intermedius  Mildly reduced LVEF with apparent anterior and inferior hypokinesis.  Initial RV infarct related to CARDIOGENIC SHOCK with transient significant bradycardia requiring initial atropine and dopamine followed by Levophed, now stabilized.  Normal right heart cath pressures with stable cardiac  output post PCI and on pressors.    Echo 07/23/2018 1. Severe akinesis of the left ventricular, mid anteroseptal wall. 2. The left ventricle has mild-moderately reduced systolic function, with an ejection fraction of 40-45%. The cavity size was normal. Left ventricular diastolic Doppler parameters are consistent with impaired relaxation. 3. Left atrial size was not assessed. 4. The mitral valve is grossly normal. 5. No stenosis of the aortic valve. 6. The interatrial septum was not assessed.   Cath 07/24/2018  Ost 1st Diag lesion is 60% stenosed.  Prox LAD lesion is 100% stenosed.  Previously placed Prox RCA to Mid RCA drug eluting stent is widely patent.  Balloon angioplasty was performed.  Dist RCA lesion is 50% stenosed with 40% stenosed side branch in Post Atrio.  Ost Ramus to Ramus lesion is 90% stenosed.  Post intervention, there is a 0% residual stenosis.  A stent was successfully placed.  Successful staged PCI to a 90% proximal ramus intermediate stenosis with ultimate insertion of a 2.25 x 15 mm Resolute Onyx DES stent postdilated to 2.5 mm with the 90% stenosis being reduced to 0%.  Laboratory Data:  High Sensitivity Troponin:  No results for input(s): TROPONINIHS in  the last 720 hours.   Cardiac EnzymesNo results for input(s): TROPONINI in the last 168 hours. No results for input(s): TROPIPOC in the last 168 hours.  Chemistry Recent Labs  Lab 09/21/18 1156 09/21/18 1633 09/22/18 0707  NA 141 141 139  K 3.8 4.0 4.2  CL 110 114* 114*  CO2 20 20* 20*  GLUCOSE 110* 111* 102*  BUN 19 19 19   CREATININE 1.27 1.35* 1.18  CALCIUM 9.5 9.6 8.8*  GFRNONAA  --  53* >60  GFRAA  --  >60 >60  ANIONGAP  --  7 5    Recent Labs  Lab 09/21/18 1156 09/21/18 1633 09/22/18 0707  PROT 7.0 6.8 5.6*  ALBUMIN 4.4 4.0 3.3*  AST 19 24 19   ALT 18 24 19   ALKPHOS 73 75 57  BILITOT 0.7 0.9 1.3*   Hematology Recent Labs  Lab 09/21/18 1156 09/21/18 1633 09/22/18  0707  WBC 3.0* 2.7* 2.5*  RBC 1.22 Repeated and verified X2.* 1.17* 1.72*  1.75*  HGB 5.0 Repeated and verified X2.* 4.8* 6.2*  HCT 13.9 Repeated and verified X2.* 13.3* 17.6*  MCV 113.9 Repeated and verified X2.* 113.7* 102.3*  MCH  --  41.0* 36.0*  MCHC 36.0 36.1* 35.2  RDW 16.3* 15.0 18.6*  PLT 179.0 162 130*    Radiology/Studies:  No results found.  Assessment and Plan:   1 CAD s/p  PCI to the RCA and ramus intermedius with CTO of the LAD (being filled via collaterals from the right) -No angina.  His current presenting symptoms (DOE) is different than his recent MI. On Brilinta 90 mg twice daily. ASA stopped 09/02/18 2nd to down trending hemoglobin.  - Continue statin.   2. ICM/Chronic systolic CHF  -LV function of 40 to 45%.  No CHF symptoms.  Euvolemic.  Unable to give beta-blocker or ACE/ARB due to hypotension.  -Pending cardiac MRI next week to assess viability for CTO of LAD  3.  Symptomatic anemia -Hgb improved to 6.2 after 2 unites of pRBCs. Plan to transfuse another 1 unit.  - Stool guaiac is negative.  Pending evaluation by GI.  Concerning for bone marrow disease.  4. Hypotension  - BP normalized on midodrine. Once anemia is correcting, may consider adding heart failure regimen gradually as outpatient.   5. HLD - 07/24/2018: Cholesterol 117; HDL 64; LDL Cholesterol 39; Triglycerides 72; VLDL 14  - Continue Crestor 77m daily  6. Crohn's disease - Held Azathioprine & Allopurinol - Per GI  For questions or updates, please contact CIrwinPlease consult [email or web address] for contact info under   SJarrett Soho PA  09/22/2018 10:20 AM   Patient seen, examined. Available data reviewed. Agree with findings, assessment, and plan as outlined by VRobbie Lis PA-C.  The patient is independently interviewed and examined.  On my exam, he is alert, oriented, in no distress.  Lungs are clear, JVP is normal, carotid upstrokes are normal without bruits, heart  is regular rate and rhythm without murmur or gallop, abdomen is soft and nontender, extremities have no edema.  The patient is admitted with symptomatic anemia with hemoglobin initially of 4.8 mg/dL.  He is found to have pancytopenia and does not have any active signs of bleeding with negative stool guaiac.  It is felt that he has bone marrow suppression related to allopurinol and azathioprine.  I have reviewed hospitalist notes and gastroenterology notes.  There is no sign of active bleeding with this patient on dual antiplatelet therapy after recent  MI.  His aspirin was actually discontinued in the outpatient setting prior to admission.  He continues on ticagrelor 90 mg twice daily.  He has followed with Dr. Ellyn Hack and he has good compliance with his medications.  He tells me that he really felt very well after PCI and then has slowly become more symptomatic as his blood count has trended downward more recently.  With no active bleeding, I do not see any reason to change his medical therapy at this point.  He will remain on ticagrelor 90 mg twice daily for antiplatelet monotherapy.  He can follow with Dr. Ellyn Hack as planned.  Hopefully his blood counts will begin to rebound now that he is off of mercaptopurine and allopurinol.  Sherren Mocha, M.D. 09/22/2018 1:53 PM

## 2018-09-22 NOTE — Progress Notes (Addendum)
[facility] PROGRESS NOTE  Darryl [name] UUV:[id number] DOB: [birth year] DOA: 09/21/2018 PCP: Janith Lima, MD  Assessment/Plan:  Symptomatic macrocytic anemia. S/p 2 units pRBC's. Hg 6.2 this am. Etiology unclear. Colonoscopy 09/2017 with anal stenosis, polyps, inflammatory changes consistent with crohn, diverticulosis. fobt negative. LDH 107, RBC 1.75, retic ct 12.1, smear pending  Home meds include Brilinta and asa since MI 06/2018.  HOLD Azathioprine  HOLD Allopurinol Transfuse 1 more unit prbc's for total 3.  Repeat cbc after transfusion CBC in am as well Cont Folic acid 19m po qday Cont vitamin b12  Await GI input  Asthma stable at baseline.  Cont Singulair 150mpo qday Cont Advair 2puff bid => Dulera 2puff bid Cont Albuterol HFA 2puff q6h prn   Gerd Cont PPI  CAD s/p NSTEMI 06/2018 with stents. No chest pain. Of note has a cardiac MRI scheduled 7/6. Cont Brillinta 9061mo bid Cont Crestor 67m67m qday Probably not able to tolerate b blocker due to orthostatic hypotension Will request cards consult as well.   Orthostatic hypotension hx of same.  Cont Midodrine 2.5mg 13mqday Will check tomorrow  Crohns disease appears stable at baseline. Reports meds changed recently due to elevated LFT. Total bili slightly elevated to day.  HOLD Azathioprine/ Allopurinol as above Cont Budesonide 9mg p59mday Appreciate gi assistance   Code Status: full Family Communication: patient Disposition Plan: home when ready   Consultants:  [facility]  Procedures:    Antibiotics:    HPI/Subjective: 68 yo 4 crohn's, recent MI s/p stents on brlinta and asa admitted symptomatic anemia. S/p 2 units. Will receive another today as Hg 6.2. gi consult  Objective: Vitals:   09/22/18 0729 09/22/18 0855  BP: 108/73 119/71  Pulse: 68 74  Resp: 17   Temp: 98.4 F (36.9 C)   SpO2: 99% 100%    Intake/Output Summary (Last 24 hours) at 09/22/2018 0918 Last data filed at  09/22/2018 0315 Gross per 24 hour  Intake 1104 ml  Output -  Net 1104 ml   Filed Weights   09/21/18 1845 09/21/18 2249 09/22/18 0313  Weight: 79.4 kg 79.2 kg 79.8 kg    Exam:   General:  Awake alert no acute distress mucus membranes slightly pale.   Cardiovascular: rrr no mgr no LE edema  Respiratory: no increased work of breathing BS clear bilaterally no wheeze  Abdomen: non-distended non-tender +BS no guarding  Musculoskeletal: joints without swelling/erythema     Data Reviewed: Basic Metabolic Panel: Recent Labs  Lab 09/21/18 1156 09/21/18 1633 09/22/18 0707  NA 141 141 139  K 3.8 4.0 4.2  CL 110 114* 114*  CO2 20 20* 20*  GLUCOSE 110* 111* 102*  BUN 19 19 19   CREATININE 1.27 1.35* 1.18  CALCIUM 9.5 9.6 8.8*   Liver Function Tests: Recent Labs  Lab 09/21/18 1156 09/21/18 1633 09/22/18 0707  AST 19 24 19   ALT 18 24 19   ALKPHOS 73 75 57  BILITOT 0.7 0.9 1.3*  PROT 7.0 6.8 5.6*  ALBUMIN 4.4 4.0 3.3*   No results for input(s): LIPASE, AMYLASE in the last 168 hours. No results for input(s): AMMONIA in the last 168 hours. CBC: Recent Labs  Lab 09/21/18 1156 09/21/18 1633 09/22/18 0707  WBC 3.0* 2.7* 2.5*  NEUTROABS 1.6  --   --   HGB 5.0 Repeated and verified X2.* 4.8* 6.2*  HCT 13.9 Repeated and verified X2.* 13.3* 17.6*  MCV 113.9 Repeated and verified X2.* 113.7* 102.3*  PLT 179.0 162 130*   Cardiac Enzymes: No results for input(s): CKTOTAL, CKMB, CKMBINDEX, TROPONINI in the last 168 hours. BNP (last 3 results) No results for input(s): BNP in the last 8760 hours.  ProBNP (last 3 results) No results for input(s): PROBNP in the last 8760 hours.  CBG: No results for input(s): GLUCAP in the last 168 hours.  Recent Results (from the past 240 hour(s))  SARS Coronavirus 2 (CEPHEID - Performed in [facility] hospital lab), Hosp Order     Status: None   Collection Time: 09/21/18  9:07 PM   Specimen: Nasopharyngeal Swab  Result Value Ref Range  Status   SARS Coronavirus 2 NEGATIVE NEGATIVE Final    Comment: (NOTE) If result is NEGATIVE SARS-CoV-2 target nucleic acids are NOT DETECTED. The SARS-CoV-2 RNA is generally detectable in upper and lower  respiratory specimens during the acute phase of infection. The lowest  concentration of SARS-CoV-2 viral copies this assay can detect is 250  copies / mL. A negative result does not preclude SARS-CoV-2 infection  and should not be used as the sole basis for treatment or other  patient management decisions.  A negative result may occur with  improper specimen collection / handling, submission of specimen other  than nasopharyngeal swab, presence of viral mutation(s) within the  areas targeted by this assay, and inadequate number of viral copies  (<250 copies / mL). A negative result must be combined with clinical  observations, patient history, and epidemiological information. If result is POSITIVE SARS-CoV-2 target nucleic acids are DETECTED. The SARS-CoV-2 RNA is generally detectable in upper and lower  respiratory specimens dur ing the acute phase of infection.  Positive  results are indicative of active infection with SARS-CoV-2.  Clinical  correlation with patient history and other diagnostic information is  necessary to determine patient infection status.  Positive results do  not rule out bacterial infection or co-infection with other viruses. If result is PRESUMPTIVE POSTIVE SARS-CoV-2 nucleic acids MAY BE PRESENT.   A presumptive positive result was obtained on the submitted specimen  and confirmed on repeat testing.  While 2019 novel coronavirus  (SARS-CoV-2) nucleic acids may be present in the submitted sample  additional confirmatory testing may be necessary for epidemiological  and / or clinical management purposes  to differentiate between  SARS-CoV-2 and other Sarbecovirus currently known to infect humans.  If clinically indicated additional testing with an alternate  test  methodology [phone]) is advised. The SARS-CoV-2 RNA is generally  detectable in upper and lower respiratory sp ecimens during the acute  phase of infection. The expected result is Negative. Fact Sheet for Patients:  StrictlyIdeas.no Fact Sheet for Healthcare Providers: BankingDealers.co.za This test is not yet approved or cleared by the [place] FDA and has been authorized for detection and/or diagnosis of SARS-CoV-2 by FDA under an Emergency Use Authorization (EUA).  This EUA will remain in effect (meaning this test can be used) for the duration of the COVID-19 declaration under Section 564(b)(1) of the Act, 21 U.S.C. section 360bbb-3(b)(1), unless the authorization is terminated or revoked sooner. Performed at [facility], [place] [address] [place], [place] [address]      Studies: No results found.  Scheduled Meds: . sodium chloride   Intravenous Once  . sodium chloride   Intravenous Once  . budesonide  9 mg Oral Daily  . folic acid  1 mg Oral Daily  . midodrine  2.5 mg Oral Daily  . mometasone-formoterol  2 puff  Inhalation BID  . montelukast  10 mg Oral QHS  . pantoprazole  40 mg Oral Daily  . rosuvastatin  40 mg Oral Daily  . ticagrelor  90 mg Oral BID  . vitamin B-12  1,000 mcg Oral Daily   Continuous Infusions:  Principal Problem:   Symptomatic anemia Active Problems:   CROHN'S DISEASE, LARGE INTESTINE   Essential hypertension, benign   CAD S/P percutaneous coronary angioplasty   Prediabetes   Hyperlipidemia with target LDL less than 70    Time spent: 42 minutes    [place] NP  [facility]  If 7PM-7AM, please contact night-coverage at [email or web address], password [facility] 09/22/2018, 9:18 AM  LOS: 0 days

## 2018-09-22 NOTE — Care Management Obs Status (Signed)
[facility] NOTIFICATION   Patient Details  Name: TAREZ BOWNS MRN: [id number] Date of Birth: 04-30-1950   Medicare Observation Status Notification Given:  Yes    Candie Chroman, LCSW 09/22/2018, 4:13 PM

## 2018-09-22 NOTE — Consult Note (Addendum)
[facility] Consult: 9:18 AM 09/22/2018  LOS: 0 days    Referring Provider: Domenic Polite MD Primary Care Physician:  Janith Lima, MD Primary Gastroenterologist:  Dr. Hilarie Fredrickson    Reason for Consultation:  Anemia.     HPI: Darryl [name] is a 68 y.o. male.  Macrocytic anemia. B 12 deficiency.   Obesity.  Htn.     Crohn's colitis dating to 1999.  No active colitis on biopsies 2009, 2013, 2016, 2019.   HP polyp 2009.  Mucosal prolapse polyp 2016.  Sessile serrated cecal polyp, HP rectal polyp in 09/2017 date of his latest Colonoscopy.  Only EGD performed in 2009 suspicious for Candida esophagitis, HH, GERD. Crohn's meds consist of budesonide, Mercaptopurine, PRN Imodium.  Also takes pantoprazole, G86 and folic acid.   Dose of Mercaptupurine reduced and Uceris added in 04/2018 due to mild hepatotoxicity, borderline level of hepatotoxic metabolite. Allopurinol added 05/2018 to "shunt his 6-MP metabolites towards 6-TG and away from 6 MMP".  Developed cold sweats, presyncope, achiness, feelings "like a heart attack" pt thought was due to med but actually due to acute MI.     Acute STEMI with RV infarct, cardiogenic shock admitted 4/30 -07/25/2018.  Severe three-vessel disease on initial catheterization.  Underwent staged balloon angioplasty, DES stenting to RCA and ramus intermedius.  Started  Brilinta, 81 ASA for 1 year.  LVEF 40 to 45%, impaired relaxation on Echo.    After discharge, pt felt well, walking 3 miles daily.  Using exercise bike.  Eating well, has lost 16# since MI.   Due to dizziness and hypotension, Micardis was discontinued. Prn midodrine added 5/26, along with rec to liberalize salt intake.  On 6/6, spent 72 m on exercise bike and after that felt awful: weak, tired, presyncopal, tachycardic with  moderate exertion.  Pt Hgb began downward trend (see below).  SOB and exertional chest pressure noted 6/9.  Cards stopped ASA on 6/10 due to progressive anemia.   Per cards suggestion, pt contacted GI office 6/18 re anemia.  Lab work obtained 6/29. Hgb 5.  GI advised him to go to ED  Recheck hemoglobins in the ED 4.8  >> 6.2 after 2 PRBCs. Hgb ranging 9.5 on 07/23/2018 >> 12 on 07/24/2018 >> 9.8 on 5/2 >> 9.4 on 5/27 MCV has been consistently elevated for many years and 113 yesterday.  Current and previous folate, B12, iron levels not diminished for at least a few years. FOBT negative.    Pt has no GI sxs: no anorexia, nausea, abd pain.  Stools formed, brown 1 x daily.  Pt initialy noted purpura on arms after starting Brilinta but these have abated.  No use of NSAIDS.  Infrequent ETOH.   Fm hx negative for IBD, anemia, colon polyps or cancers.       Past Medical History:  Diagnosis Date  . Acute upper respiratory infections of unspecified site   . Allergic rhinitis, cause unspecified   . Anal stenosis   . Asthma   . Cataract   . Coronary artery disease involving  native coronary artery of native heart without angina pectoris     p-mRCA 100% (DES PCI - p-mRCA 100% (DES PCI - Resolute Onyx 3.5 x 26 --> ~3.7 mm)), dRCA ~50% & PAV 40%; pRI 85% (Staged DES PCI  Resolute Onyx 2.25 x 15 - ~2.5 mm). 100% LAD CTO after small D1 with ost-prox 60%. R-L collaterals to LAD seen post PCI.  Marland Kitchen Crohn's colitis ([place])   . Dizziness and giddiness    Related to borderline hypotension  . Exostosis of unspecified site   . GERD (gastroesophageal reflux disease)   . History of acute inferior wall MI 06/2018   100% p-mRCA -- DES PCI (complicated by mild cardiogenic shock, partially related to 100% LAD CTO) --> staged PCI-RI  . Hyperlipidemia with target LDL less than 70 07/04/2015   Now s/p MI with MV CAD - optimal LDL < 50  . Hyperplastic colon polyp 02/26/2008  . Hypertension   . Lateral epicondylitis  of elbow    . Nontraumatic rupture of patellar tendon   . Obesity, unspecified   . Regional enteritis of unspecified site   . Seasonal allergies   . Sessile colonic polyp   . Vitamin B12 deficiency     Past Surgical History:  Procedure Laterality Date  . COLONOSCOPY     2016  . CORONARY STENT INTERVENTION N/A 07/24/2018   Procedure: CORONARY STENT INTERVENTION;  Surgeon: Troy Sine, MD;  Location: [facility];  Service: Cardiovascular: Staged PCI pRI 85-90%: Resolute Onyx DES 2.25 x 15 (2.5 mm)  . CORONARY/GRAFT ACUTE MI REVASCULARIZATION N/A 07/23/2018   Procedure: CORONARY/GRAFT ACUTE MI REVASCULARIZATION;  Surgeon: [name], MD;  Location: [facility];  Service: Cardiovascular;::  p-mRCA 100% (DES PCI - p-mRCA 100% (DES PCI - Resolute Onyx 3.5 x 26 (post-dilated ~3.7 mm)), dRCA ~50% & PAV 40%; pRI 85% (Staged PCI). 100% LAD CTO after small D1 with ost-prox 60%. R-L collaterals to LAD seen post PCI.;   . LEFT HEART CATH N/A 07/24/2018   Procedure: LEFT HEART CATH;  Surgeon: Troy Sine, MD;  Location: [facility];  Service: Cardiovascular;  Laterality: N/A; 85-90% pRCA, 60% ost D1 then 100% CTO of LAD -- staged DES PCI LAD  . LEFT HEART CATH AND CORONARY ANGIOGRAPHY  07/23/2018   Procedure: LEFT HEART CATH;  Surgeon: [name]., MD;  Location: [facility]   p-mRCA 100% (DES PCI),dRCA ~50% & PAV 40%; pRI 85% (Staged PCI). 100% LAD CTO after small D1 with ost-prox 60%. R-L collaterals to LAD seen post PCI.;   . RIGHT HEART CATH N/A 07/23/2018   Procedure: RIGHT HEART CATH;  Surgeon: [name], MD;  Location: [facility];  Service: Cardiovascular;  Normal RHC #s, CO/CI normal  . TONSILLECTOMY AND ADENOIDECTOMY    . TRANSTHORACIC ECHOCARDIOGRAM  07/23/2018   Insetting of inferior MI with occluded LAD CTO    Prior to Admission medications   Medication Sig Start Date End Date Taking? Authorizing Provider  allopurinol (ZYLOPRIM) 100 MG tablet  TAKE 1 TABLET ONCE DAILY. 08/19/18  Yes Pyrtle, Lajuan Lines, MD  Budesonide ER (UCERIS) 9 MG TB24 Take 1 tablet by mouth daily. 04/28/18  Yes Pyrtle, Lajuan Lines, MD  fluticasone-salmeterol (ADVAIR HFA) [phone] MCG/ACT inhaler USE 2 PUFFS TWICE DAILY AS NEEDED. SHAKE WELL Patient taking differently: Inhale 2 puffs into the lungs 2 (two) times daily.  09/08/18  Yes Janith Lima, MD  folic acid (  FOLVITE) 1 MG tablet TAKE 1 TABLET ONCE DAILY. Patient taking differently: Take 1 mg by mouth daily.  11/14/16  Yes Pyrtle, Lajuan Lines, MD  hydrocortisone 2.5 % cream Apply 1 application topically daily.  07/28/17  Yes [provider]  loperamide (IMODIUM A-D) 2 MG tablet Take 2 mg by mouth 4 (four) times daily as needed for diarrhea or loose stools.   Yes [provider]  mercaptopurine (PURINETHOL) 50 MG tablet TAKE 1&1/2 TABLETS ONCE DAILY. Patient taking differently: Take 75 mg by mouth daily.  09/21/18  Yes Pyrtle, Lajuan Lines, MD  midodrine (PROAMATINE) 2.5 MG tablet Take 2.5 mg every morning , may take an additional dose after 6 hr if still dizzy or for systloic BP less than 95 bpm with dizziness. 08/18/18  Yes [name], MD  montelukast (SINGULAIR) 10 MG tablet TAKE ONE TABLET AT BEDTIME. Patient taking differently: Take 10 mg by mouth at bedtime.  04/13/18  Yes Janith Lima, MD  Multiple Vitamins-Minerals (CENTRUM SILVER PO) Take 1 tablet by mouth daily.   Yes [provider]  nitroGLYCERIN (NITROSTAT) 0.4 MG SL tablet Place 1 tablet (0.4 mg total) under the tongue every 5 (five) minutes x 3 doses as needed for chest pain. 07/25/18  Yes Meng, Isaac Laud, PA  Omega-3 Fatty Acids (DIALYVITE OMEGA-3 CONCENTRATE) 600 MG CAPS Take 1 tablet by mouth 2 (two) times daily.   Yes [provider]  pantoprazole (PROTONIX) 40 MG tablet Take 1 tablet (40 mg total) by mouth daily. 09/08/18  Yes Janith Lima, MD  rosuvastatin (CRESTOR) 40 MG tablet Take 1 tablet (40 mg total) by mouth daily. 07/25/18  Yes Almyra Deforest, PA  ticagrelor (BRILINTA) 90 MG TABS tablet Take 1 tablet (90 mg total) by mouth 2 (two) times daily. 07/25/18  Yes Meng, Isaac Laud, PA  traMADol (ULTRAM) 50 MG tablet TAKE ONE TABLET EVERY 6 HOURS AS NEEDED. Patient taking differently: Take 50 mg by mouth every 6 (six) hours as needed (pain).  04/28/18  Yes Janith Lima, MD  VENTOLIN HFA 108 (90 Base) MCG/ACT inhaler USE 2 PUFFS EVERY 6 HOURS AS NEEDED FOR WHEEZING. **SHAKE WELL** Patient taking differently: Inhale 2 puffs into the lungs every 6 (six) hours as needed for wheezing.  09/08/18  Yes Janith Lima, MD  vitamin B-12 (CYANOCOBALAMIN) 1000 MCG tablet Take 1 tablet (1,000 mcg total) by mouth daily. 08/11/13  Yes Pyrtle, Lajuan Lines, MD  metoprolol tartrate (LOPRESSOR) 25 MG tablet Take 1 tablet (25 mg total) by mouth once for 1 dose. TAKE THE MORNING OF MRI 09/02/18 09/21/18  Lendon Colonel, NP    Scheduled Meds: . sodium chloride   Intravenous Once  . sodium chloride   Intravenous Once  . budesonide  9 mg Oral Daily  . folic acid  1 mg Oral Daily  . midodrine  2.5 mg Oral Daily  . mometasone-formoterol  2 puff Inhalation BID  . montelukast  10 mg Oral QHS  . pantoprazole  40 mg Oral Daily  . rosuvastatin  40 mg Oral Daily  . ticagrelor  90 mg Oral BID  . vitamin B-12  1,000 mcg Oral Daily   Infusions:  PRN Meds: acetaminophen **OR** acetaminophen, albuterol, loperamide, traMADol   Allergies as of 09/21/2018 - Review Complete 09/21/2018  Allergen Reaction Noted  . Asacol [mesalamine] Hives 11/20/2011  . Amoxicillin Rash 02/07/2005  . Penicillins Rash 07/23/2018  . Sulfamethoxazole Rash 02/07/2005    Family History  Problem Relation  Age of Onset  . Diabetes Brother   . Diabetes Father   . Heart attack Father   . Hypertension Father   . Heart disease Father   . Colon cancer Neg Hx   . Stomach cancer Neg Hx   . Hyperlipidemia Neg Hx   . Sudden death Neg Hx   . Cancer Neg Hx   . COPD Neg Hx   . Alcohol abuse Neg Hx    . Drug abuse Neg Hx   . Stroke Neg Hx   . Rectal cancer Neg Hx   . Esophageal cancer Neg Hx     Social History   Socioeconomic History  . Marital status: Married    Spouse name: Not on file  . Number of children: Not on file  . Years of education: Not on file  . Highest education level: Not on file  Occupational History  . Occupation: Optometrist  Social Needs  . Financial resource strain: Not on file  . Food insecurity    Worry: Not on file    Inability: Not on file  . Transportation needs    Medical: Not on file    Non-medical: Not on file  Tobacco Use  . Smoking status: Never Smoker  . Smokeless tobacco: Never Used  Substance and Sexual Activity  . Alcohol use: Yes    Alcohol/week: 0.0 standard drinks    Comment: rare  . Drug use: No  . Sexual activity: Yes  Lifestyle  . Physical activity    Days per week: Not on file    Minutes per session: Not on file  . Stress: Not on file  Relationships  . Social Herbalist on phone: Not on file    Gets together: Not on file    Attends religious service: Not on file    Active member of club or organization: Not on file    Attends meetings of clubs or organizations: Not on file    Relationship status: Not on file  . Intimate partner violence    Fear of current or ex partner: Not on file    Emotionally abused: Not on file    Physically abused: Not on file    Forced sexual activity: Not on file  Other Topics Concern  . Not on file  Social History Narrative  . Not on file    REVIEW OF SYSTEMS: Constitutional:  See HPI ENT:  No nose bleeds Pulm:  See HPI.  No dyspnea at rest, only with exertion.   CV:  Exertional palpitations, no LE edema.  GU:  No hematuria, no frequency.  No hematuria GI: Per HPI. Heme: Per HPI. Transfusions: None prior to yesterday. Neuro:  No headaches, no peripheral tingling or numbness Derm:  No itching, no rash or sores.  Endocrine:  No sweats or chills.  No polyuria or dysuria  Immunization: Reviewed.  Up-to-date on multiple vaccinations. Travel:  None beyond local counties in last few months.    PHYSICAL EXAM: Vital signs in last 24 hours: Vitals:   09/22/18 0729 09/22/18 0855  BP: 108/73 119/71  Pulse: 68 74  Resp: 17   Temp: 98.4 F (36.9 C)   SpO2: 99% 100%   Wt Readings from Last 3 Encounters:  09/22/18 79.8 kg  09/02/18 84.4 kg  08/18/18 81.4 kg    General: Pleasant, comfortable, looks well. Head: No facial asymmetry or swelling.  No signs of head trauma. Eyes: EOMI.  Conjunctiva moderately pale. Ears: Not hard of  hearing Nose: No congestion, no discharge Mouth: Tongue midline.  Oral mucosa pink, moist, clear.  Fair dentition. Neck: No JVD, no masses, no thyromegaly. Lungs: Clear bilaterally.  No labored breathing.  No cough. Heart: RRR.  No MRG.  S1, S2 present. Abdomen: Soft.  Not tender or distended.  No HSM, masses, bruits, hernias.  Active bowel sounds..   Rectal: Deferred DRE.  Stool FOBT negative yesterday. Musc/Skeltl: No joint redness, swelling, gross deformity. Extremities: No CCE. Neurologic: Fully alert and oriented.  Fluid speech.  Moves all 4 limbs with full strength.  No tremor. Skin: Rash, no sores, no purpura/bruising.  Telangiectasia.  No suspicious lesions. Tattoos: None. Nodes: No cervical adenopathy. Psych: Calm, pleasant, in good spirits.  Intake/Output from previous day: 06/29 0701 - 06/30 0700 In: 1104 [P.O.:50; Blood:1054] Out: -  Intake/Output this shift: No intake/output data recorded.  LAB RESULTS: Recent Labs    09/21/18 1156 09/21/18 1633 09/22/18 0707  WBC 3.0* 2.7* 2.5*  HGB 5.0 Repeated and verified X2.* 4.8* 6.2*  HCT 13.9 Repeated and verified X2.* 13.3* 17.6*  PLT 179.0 162 130*   BMET Lab Results  Component Value Date   NA 139 09/22/2018   NA 141 09/21/2018   NA 141 09/21/2018   K 4.2 09/22/2018   K 4.0 09/21/2018   K 3.8 09/21/2018   CL 114 (H) 09/22/2018   CL 114 (H) 09/21/2018    CL 110 09/21/2018   CO2 20 (L) 09/22/2018   CO2 20 (L) 09/21/2018   CO2 20 09/21/2018   GLUCOSE 102 (H) 09/22/2018   GLUCOSE 111 (H) 09/21/2018   GLUCOSE 110 (H) 09/21/2018   BUN 19 09/22/2018   BUN 19 09/21/2018   BUN 19 09/21/2018   CREATININE 1.18 09/22/2018   CREATININE 1.35 (H) 09/21/2018   CREATININE 1.27 09/21/2018   CALCIUM 8.8 (L) 09/22/2018   CALCIUM 9.6 09/21/2018   CALCIUM 9.5 09/21/2018   LFT Recent Labs    09/21/18 1156 09/21/18 1633 09/22/18 0707  PROT 7.0 6.8 5.6*  ALBUMIN 4.4 4.0 3.3*  AST 19 24 19   ALT 18 24 19   ALKPHOS 73 75 57  BILITOT 0.7 0.9 1.3*   PT/INR Lab Results  Component Value Date   INR 1.0 07/23/2018     RADIOLOGY STUDIES: No results found.  IMPRESSION:   *    Anemia, macrocytic.  Takes oral P38 an folic acid daily.   This is part of a pancytopenic picture with leukopenia and thrombocytopenia. Wonder if Azathioprine or Allopurinol induced myelosuppression could be culprit.   He is FOBT negative and reveals no story of overt or even subtle GI bleeding  *  Hx Crohns colitis, well under control for many years.    *   Recent DES stenting, acute STEMI 2 months ago.  On Brilinta since then.  81 ASA stopped 6/10.   *  Hx colon polyps, HP and sessile serrated type.  Up to date on screening studies, latest was 09/2017.        PLAN:     *  No plans for EGD or colonoscopy today so he can eat. Dr Loletha Carrow will be seeing pt.      Azucena Freed  09/22/2018, 9:18 AM Phone [phone]  I have reviewed the entire case in detail with the above APP and discussed the plan in detail.  Therefore, I agree with the diagnoses recorded above. In addition,  I have personally interviewed and examined the patient.  My additional  thoughts are as follows:  Pancytopenia with relatively more impressive degree of red cell decrease.  No overt or microscopic GI bleeding, even in setting of dual antiplatelet therapy with recent MI.   Crohn's has been under  good control for many years, including on last colonoscopy July 2019. This pancytopenia coincides with a recent increase of mercaptopurine dose along with allopurinol.  I do not feel he needs an endoscopic work-up.   He is receiving his third of 4 units of PRBCs.  When the medicine service feels he is stable for discharge after transfusions are complete, I feel he can be sent home for close outpatient follow-up.  There has been suggestion of hematology consultant.  I will leave this to the discretion of the medicine service if that is felt to be necessary as inpatient versus follow his blood counts closely is in the outpatient setting while off mercaptopurine and obtain consultation as necessary.  He should be discharged home off mercaptopurine and allopurinol.  I will communicate with Dr. Hilarie Fredrickson to coordinate care.  Nelida Meuse III Office:[phone]

## 2018-09-23 HISTORY — PX: OTHER SURGICAL HISTORY: SHX169

## 2018-09-23 LAB — CBC
HCT: 24.1 % — ABNORMAL LOW (ref 39.0–52.0)
Hemoglobin: 8.5 g/dL — ABNORMAL LOW (ref 13.0–17.0)
MCH: 33.9 pg (ref 26.0–34.0)
MCHC: 35.3 g/dL (ref 30.0–36.0)
MCV: 96 fL (ref 80.0–100.0)
Platelets: 131 10*3/uL — ABNORMAL LOW (ref 150–400)
RBC: 2.51 MIL/uL — ABNORMAL LOW (ref 4.22–5.81)
RDW: 18.6 % — ABNORMAL HIGH (ref 11.5–15.5)
WBC: 3 10*3/uL — ABNORMAL LOW (ref 4.0–10.5)
nRBC: 0 % (ref 0.0–0.2)

## 2018-09-23 LAB — TYPE AND SCREEN
ABO/RH(D): O POS
Antibody Screen: NEGATIVE
Unit division: 0
Unit division: 0
Unit division: 0
Unit division: 0

## 2018-09-23 LAB — BPAM RBC
Blood Product Expiration Date: 202007192359
Blood Product Expiration Date: 202007292359
Blood Product Expiration Date: 202007302359
Blood Product Expiration Date: 202007302359
ISSUE DATE / TIME: 202006291955
ISSUE DATE / TIME: 202006292346
ISSUE DATE / TIME: 202006301105
ISSUE DATE / TIME: 202006301536
Unit Type and Rh: 5100
Unit Type and Rh: 5100
Unit Type and Rh: 5100
Unit Type and Rh: 5100

## 2018-09-23 LAB — TSH: TSH: 0.679 u[IU]/mL (ref 0.350–4.500)

## 2018-09-24 ENCOUNTER — Ambulatory Visit: Payer: Medicare Other | Admitting: Cardiology

## 2018-09-24 ENCOUNTER — Telehealth: Payer: Self-pay | Admitting: *Deleted

## 2018-09-24 NOTE — Telephone Encounter (Signed)
Called Darryl [name] concerning hosp appt that's scheduled for 10/01/18. Darryl [name] states his wife made appt yesterday when he came home so he is aware. Inform Darryl [name] had some additional questions concerning discharge completed TCM call below.Darryl [name]  Transition Care Management Follow-up Telephone Call   Date discharged? 09/23/18   How have you been since you were released from the hospital? Darryl [name] states he is feeling great. He havent felt this good in months   Do you understand why you were in the hospital? YES   Do you understand the discharge instructions? YES   Where were you discharged to? Home   Items Reviewed:  Medications reviewed: YES, Darryl [name] states he was told to stop his allopurinol and azathioprine until he have his follow-up appt w/his GI provider   Allergies reviewed: YES  Dietary changes reviewed: YES  Referrals reviewed: YES, have appt w/GI 09/29/18   Functional Questionnaire:   Activities of Daily Living (ADLs):   He states he are independent in the following: ambulation, bathing and hygiene, feeding, continence, grooming, toileting and dressing States he doesn't require assistance.    Any transportation issues/concerns?: NO   Any patient concerns? NO   Confirmed importance and date/time of follow-up visits scheduled YES, appt 10/01/18  Provider Appointment booked with Dr. Ronnald Ramp  Confirmed with patient if condition begins to worsen call PCP or go to the ER.  Patient was given the office number and encouraged to call back with question or concerns.  : YES

## 2018-09-26 LAB — THIOPURINE METABOLITES
6 MMP(6-Methylmercaptopurine): 11806 pmol/8x10(8)RBC — ABNORMAL HIGH (ref <5700)
6 TG(6-Thioguanine): 774 pmol/8x10(8)RBC — ABNORMAL HIGH (ref 235–400)

## 2018-09-26 NOTE — Discharge Summary (Signed)
[facility] Discharge Summary   Patient: Darryl [name] WEX:[id number]   PCP: Janith Lima, MD DOB: [birth year]   Date of admission: 09/21/2018   Date of discharge: 09/23/2018     Discharge Diagnoses:  Principal Problem:   Symptomatic anemia Active Problems:   CROHN'S DISEASE, LARGE INTESTINE   Prediabetes   Essential hypertension, benign   Hyperlipidemia with target LDL less than 70   CAD S/P percutaneous coronary angioplasty  Admitted From: home Disposition:  Home home health  Recommendations for Outpatient Follow-up:  1. Please follow-up with PCP in 1 week with repeat CBC  Follow-up Information    Janith Lima, MD. Go on 10/01/2018.   Specialty: Internal Medicine Why: @2 :00pm Contact information: [address] [address] [phone]          Diet recommendation: Cardiac diet  Activity: The patient is advised to gradually reintroduce usual activities,as tolerated .  Discharge Condition: good  Code Status: full code  History of present illness: As per the H and P dictated on admission, "Darryl [name]  is a 68 y.o. male,  w CAD with recent NSTEMI (peak trop 24.25)  s/p DES prox ramus 07/24/18, w prior DES mid RCA , Asthma, Gerd,  Crohns disease, Vitamin B12 deficiency, apparently presents with dyspnea on exertion and lightheadedness. Pt had Hgb checked by GI and told 5.0 and sent to ER Pt denies cp, palp, n/v, abd pain, diarrhea , brbpr, black stool.  Pt had rectal and no melana and stool was FOBT negative. Pt is currently on brillinta and aspirin.  Pt will be admitted for symptomatic anemia"  Hospital Course:  Summary of his active problems in the hospital is as following. Symptomatic macrocytic anemia.  S/p 4 units pRBC's.  Etiology unclear.  Colonoscopy 09/2017 with anal stenosis, polyps, inflammatory changes consistent with crohn, diverticulosis.  fobt negative.  LDH 107, RBC 1.75, retic ct 12.1, smear unremarkable  Home meds  include Brilinta and asa since MI 06/2018.  HOLD Azathioprine  HOLD Allopurinol Cont Folic acid 61m po qday Cont vitamin b12  GI consulted recommend to stay off of mercaptopurine allopurinol, but no evidence of active bleeding therefore no indication for inpatient procedures. Discussed with hematology on-call, felt the combination of Allopurinol to 6MP is the likely etiology, on chart review pts Hb has declined since march when he started Allopurinol, in addition to his chronic 6MP. Bone marrow will likely take few weeks to recover per Dr.Sherril.  Asthma stable at baseline.  Cont Singulair 120mpo qday Cont Advair 2puff bid => Dulera 2puff bid Cont Albuterol HFA 2puff q6h prn   Gerd Cont PPI  CAD s/p NSTEMI 06/2018 with stents. No chest pain. Of note has a cardiac MRI scheduled 7/6. Cont Brillinta 9060mo bid Cont Crestor 82m3m qday Probably not able to tolerate b blocker due to orthostatic hypotension Cardiology consulted.  Recommend outpatient follow-up and outpatient work-up with the MRI.  Orthostatic hypotension hx of same.  Cont Midodrine 2.5mg 66mqday No further positive orthostatics in the hospital.  Crohns disease appears stable at baseline. Reports meds changed recently due to elevated LFT. Total bili slightly elevated to day.  HOLD Azathioprine/ Allopurinol as above Cont Budesonide 9mg p80mday Appreciate gi assistance  Patient was ambulatory without any assistance. On the day of the discharge the patient's vitals were stable , and no other acute medical condition were reported by patient. the patient was felt safe to be discharge [facility]  with family.  Consultants: gastroenterology cardiology  Procedures: none  DISCHARGE MEDICATION: Allergies as of 09/23/2018      Reactions   Asacol [mesalamine] Hives   Trouble swallowing, loss of appetite   Amoxicillin Rash   Did it involve swelling of the face/tongue/throat, SOB, or low BP? Unknown Did it involve sudden or  severe rash/hives, skin peeling, or any reaction on the inside of your mouth or nose? Unknown Did you need to seek medical attention at a hospital or doctor's office? Unknown When did it last happen?b/c of penicillin allergy If all above answers are "NO", may proceed with cephalosporin use.   Penicillins Rash   Did it involve swelling of the face/tongue/throat, SOB, or low BP? Yes Did it involve sudden or severe rash/hives, skin peeling, or any reaction on the inside of your mouth or nose? No Did you need to seek medical attention at a hospital or doctor's office? No When did it last happen?more than 15 yrs If all above answers are "NO", may proceed with cephalosporin use.   Sulfamethoxazole Rash      Medication List    STOP taking these medications   allopurinol 100 MG tablet Commonly known as: ZYLOPRIM   mercaptopurine 50 MG tablet Commonly known as: PURINETHOL     TAKE these medications   Advair HFA 115-21 MCG/ACT inhaler Generic drug: fluticasone-salmeterol USE 2 PUFFS TWICE DAILY AS NEEDED. SHAKE WELL What changed:   how much to take  how to take this  when to take this  additional instructions   Budesonide ER 9 MG Tb24 Commonly known as: Uceris Take 1 tablet by mouth daily. Notes to patient: 7/2   CENTRUM SILVER PO Take 1 tablet by mouth daily.   Dialyvite Omega-3 Concentrate 600 MG Caps Take 1 tablet by mouth 2 (two) times daily.   folic acid 1 MG tablet Commonly known as: FOLVITE TAKE 1 TABLET ONCE DAILY. Notes to patient: 7/2   hydrocortisone 2.5 % cream Apply 1 application topically daily.   loperamide 2 MG tablet Commonly known as: IMODIUM A-D Take 2 mg by mouth 4 (four) times daily as needed for diarrhea or loose stools.   metoprolol tartrate 25 MG tablet Commonly known as: LOPRESSOR Take 1 tablet (25 mg total) by mouth once for 1 dose. TAKE THE MORNING OF MRI   midodrine 2.5 MG tablet Commonly known as: PROAMATINE Take 2.5 mg  every morning , may take an additional dose after 6 hr if still dizzy or for systloic BP less than 95 bpm with dizziness. Notes to patient: 7/2   montelukast 10 MG tablet Commonly known as: SINGULAIR TAKE ONE TABLET AT BEDTIME. Notes to patient: 7/1   nitroGLYCERIN 0.4 MG SL tablet Commonly known as: NITROSTAT Place 1 tablet (0.4 mg total) under the tongue every 5 (five) minutes x 3 doses as needed for chest pain.   pantoprazole 40 MG tablet Commonly known as: PROTONIX Take 1 tablet (40 mg total) by mouth daily. Notes to patient: 7/2   rosuvastatin 40 MG tablet Commonly known as: Crestor Take 1 tablet (40 mg total) by mouth daily. Notes to patient: 7/2   ticagrelor 90 MG Tabs tablet Commonly known as: BRILINTA Take 1 tablet (90 mg total) by mouth 2 (two) times daily. Notes to patient: 7/1   traMADol 50 MG tablet Commonly known as: ULTRAM TAKE ONE TABLET EVERY 6 HOURS AS NEEDED. What changed: See the new instructions.   Ventolin HFA 108 (90 Base) MCG/ACT inhaler Generic drug:  albuterol USE 2 PUFFS EVERY 6 HOURS AS NEEDED FOR WHEEZING. **SHAKE WELL** What changed: See the new instructions.   vitamin B-12 1000 MCG tablet Commonly known as: CYANOCOBALAMIN Take 1 tablet (1,000 mcg total) by mouth daily. Notes to patient: 7/2      Allergies  Allergen Reactions  . Asacol [Mesalamine] Hives    Trouble swallowing, loss of appetite  . Amoxicillin Rash    Did it involve swelling of the face/tongue/throat, SOB, or low BP? Unknown Did it involve sudden or severe rash/hives, skin peeling, or any reaction on the inside of your mouth or nose? Unknown Did you need to seek medical attention at a hospital or doctor's office? Unknown When did it last happen?b/c of penicillin allergy If all above answers are "NO", may proceed with cephalosporin use.   Marland Kitchen Penicillins Rash    Did it involve swelling of the face/tongue/throat, SOB, or low BP? Yes Did it involve sudden or severe  rash/hives, skin peeling, or any reaction on the inside of your mouth or nose? No Did you need to seek medical attention at a hospital or doctor's office? No When did it last happen?more than 15 yrs If all above answers are "NO", may proceed with cephalosporin use.   . Sulfamethoxazole Rash   Discharge Instructions    Diet - low sodium heart healthy   Complete by: As directed    Discharge instructions   Complete by: As directed    It is important that you read the given instructions as well as go over your medication list with RN to help you understand your care after this hospitalization.  Discharge Instructions: Please follow-up with PCP in 1-2 weeks  Please request your primary care physician to go over all Hospital Tests and Procedure/Radiological results at the follow up. Please get all Hospital records sent to your PCP by signing hospital release before you go home.   Do not drive, operating heavy machinery, perform activities at heights, swimming or participation in water activities or provide baby sitting services; until you have been seen by Primary Care Physician or a Neurologist and advised to do so again. Do not take more than prescribed Pain, Sleep and Anxiety Medications. You were cared for by a hospitalist during your hospital stay. If you have any questions about your discharge medications or the care you received while you were in the hospital after you are discharged, you can call the unit @UNIT @ you were admitted to and ask to speak with the hospitalist on call if the hospitalist that took care of you is not available.  Once you are discharged, your primary care physician will handle any further medical issues. Please note that NO REFILLS for any discharge medications will be authorized once you are discharged, as it is imperative that you return to your primary care physician (or establish a relationship with a primary care physician if you do not have one) for your  aftercare needs so that they can reassess your need for medications and monitor your lab values. You Must read complete instructions/literature along with all the possible adverse reactions/side effects for all the Medicines you take and that have been prescribed to you. Take any new Medicines after you have completely understood and accept all the possible adverse reactions/side effects. Wear Seat belts while driving. If you have smoked or chewed Tobacco in the last 2 yrs please stop smoking and/or stop any Recreational drug use.   Increase activity slowly   Complete by: As directed  Discharge Exam: Filed Weights   09/21/18 2249 09/22/18 0313 09/23/18 0617  Weight: 79.2 kg 79.8 kg 78.8 kg   Vitals:   09/23/18 0755 09/23/18 0908  BP:  124/67  Pulse:  72  Resp:    Temp:    SpO2: 99% 100%   General: Appear in no distress, no Rash; Oral Mucosa Clear, moist. no Abnormal Mass Or lumps Cardiovascular: S1 and S2 Present, no Murmur, Respiratory: normal respiratory effort, Bilateral Air entry present and Clear to Auscultation, no Crackles, no wheezes Abdomen: Bowel Sound present, Soft and no tenderness, no hernia Extremities: no Pedal edema, no calf tenderness Neurology: alert and oriented to time, place, and person affect appropriate. normal without focal findings, mental status, speech normal, alert and oriented x3, PERLA, Motor strength 5/5 and symmetric and sensation grossly normal to light touch   The results of significant diagnostics from this hospitalization (including imaging, microbiology, ancillary and laboratory) are listed below for reference.    Significant Diagnostic Studies: No results found.  Microbiology: Recent Results (from the past 240 hour(s))  SARS Coronavirus 2 (CEPHEID - Performed in [place] hospital lab), Hosp Order     Status: None   Collection Time: 09/21/18  9:07 PM   Specimen: Nasopharyngeal Swab  Result Value Ref Range Status   SARS Coronavirus 2  NEGATIVE NEGATIVE Final    Comment: (NOTE) If result is NEGATIVE SARS-CoV-2 target nucleic acids are NOT DETECTED. The SARS-CoV-2 RNA is generally detectable in upper and lower  respiratory specimens during the acute phase of infection. The lowest  concentration of SARS-CoV-2 viral copies this assay can detect is 250  copies / mL. A negative result does not preclude SARS-CoV-2 infection  and should not be used as the sole basis for treatment or other  patient management decisions.  A negative result may occur with  improper specimen collection / handling, submission of specimen other  than nasopharyngeal swab, presence of viral mutation(s) within the  areas targeted by this assay, and inadequate number of viral copies  (<250 copies / mL). A negative result must be combined with clinical  observations, patient history, and epidemiological information. If result is POSITIVE SARS-CoV-2 target nucleic acids are DETECTED. The SARS-CoV-2 RNA is generally detectable in upper and lower  respiratory specimens dur ing the acute phase of infection.  Positive  results are indicative of active infection with SARS-CoV-2.  Clinical  correlation with patient history and other diagnostic information is  necessary to determine patient infection status.  Positive results do  not rule out bacterial infection or co-infection with other viruses. If result is PRESUMPTIVE POSTIVE SARS-CoV-2 nucleic acids MAY BE PRESENT.   A presumptive positive result was obtained on the submitted specimen  and confirmed on repeat testing.  While 2019 novel coronavirus  (SARS-CoV-2) nucleic acids may be present in the submitted sample  additional confirmatory testing may be necessary for epidemiological  and / or clinical management purposes  to differentiate between  SARS-CoV-2 and other Sarbecovirus currently known to infect humans.  If clinically indicated additional testing with an alternate test  methodology [phone])  is advised. The SARS-CoV-2 RNA is generally  detectable in upper and lower respiratory sp ecimens during the acute  phase of infection. The expected result is Negative. Fact Sheet for Patients:  StrictlyIdeas.no Fact Sheet for Healthcare Providers: BankingDealers.co.za This test is not yet approved or cleared by the [place] FDA and has been authorized for detection and/or diagnosis of SARS-CoV-2 by FDA under an Emergency  Use Authorization (EUA).  This EUA will remain in effect (meaning this test can be used) for the duration of the COVID-19 declaration under Section 564(b)(1) of the Act, 21 U.S.C. section 360bbb-3(b)(1), unless the authorization is terminated or revoked sooner. Performed at [facility], [place] [address] [place], [place] [address]      Labs: CBC: Recent Labs  Lab 09/21/18 1156 09/21/18 1633 09/22/18 0707 09/22/18 2105 09/23/18 0230  WBC 3.0* 2.7* 2.5*  --  3.0*  NEUTROABS 1.6  --   --   --   --   HGB 5.0 Repeated and verified X2.* 4.8* 6.2* 8.6* 8.5*  HCT 13.9 Repeated and verified X2.* 13.3* 17.6* 24.3* 24.1*  MCV 113.9 Repeated and verified X2.* 113.7* 102.3*  --  96.0  PLT 179.0 162 130*  --  867*   Basic Metabolic Panel: Recent Labs  Lab 09/21/18 1156 09/21/18 1633 09/22/18 0707  NA 141 141 139  K 3.8 4.0 4.2  CL 110 114* 114*  CO2 20 20* 20*  GLUCOSE 110* 111* 102*  BUN 19 19 19   CREATININE 1.27 1.35* 1.18  CALCIUM 9.5 9.6 8.8*   Liver Function Tests: Recent Labs  Lab 09/21/18 1156 09/21/18 1633 09/22/18 0707  AST 19 24 19   ALT 18 24 19   ALKPHOS 73 75 57  BILITOT 0.7 0.9 1.3*  PROT 7.0 6.8 5.6*  ALBUMIN 4.4 4.0 3.3*   No results for input(s): LIPASE, AMYLASE in the last 168 hours. No results for input(s): AMMONIA in the last 168 hours. Cardiac Enzymes: No results for input(s): CKTOTAL, CKMB, CKMBINDEX, TROPONINI in the last 168 hours. BNP (last 3 results) No results for  input(s): BNP in the last 8760 hours. CBG: No results for input(s): GLUCAP in the last 168 hours. Time spent: 35 minutes  Signed:  Berle Mull  [facility] 09/23/2018

## 2018-09-28 ENCOUNTER — Ambulatory Visit (HOSPITAL_COMMUNITY)
Admission: RE | Admit: 2018-09-28 | Discharge: 2018-09-28 | Disposition: A | Discharge status: Home / Self Care | Payer: Medicare Other | Source: Ambulatory Visit | Attending: Adult Health | Admitting: Adult Health

## 2018-09-28 ENCOUNTER — Other Ambulatory Visit: Payer: Self-pay

## 2018-09-28 DIAGNOSIS — I259 Chronic ischemic heart disease, unspecified: Secondary | ICD-10-CM | POA: Insufficient documentation

## 2018-09-28 IMAGING — MR MR CARDIA MORPHOLOGY WITHOUT AND WITH CONTRAST
20 of 22 series · 38 of 40 positions shown · IV contrast (Gadavist)
Comparison: none

CLINICAL DATA: 68-year-old male with known CAD, ischemic
cardiomyopathy, known occluded LAD. Evaluate for viability.

EXAM:
CARDIAC MRI
TECHNIQUE: The patient was scanned on a 1.5 Tesla GE magnet. A dedicated
cardiac coil was used. Functional imaging was done using Fiesta
sequences. [DATE], and 4 chamber views were done to assess for RWMA's.
Modified MANTAS RALAS rule using a short axis stack was used to
calculate an ejection fraction on a dedicated work station using
Circle software. The patient received 10 cc of Gadavist. After 10
minutes inversion recovery sequences were used to assess for
infiltration and scar tissue.
CONTRAST:  10 cc  of Gadavist

[Series 6: bSSFP · oblique · 8.0mm · 1.61mm/px · 19 of 425 slices shown (1 of 4)]
[im 1/425]
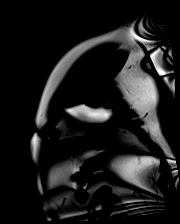
[im 24/425]
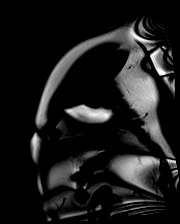
[im 48/425]
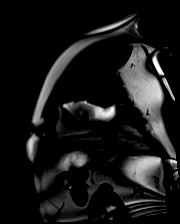
[im 71/425]
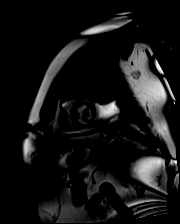
[im 95/425]
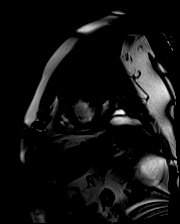
[im 118/425]
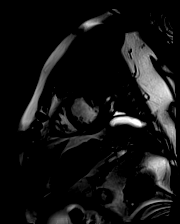
[im 142/425]
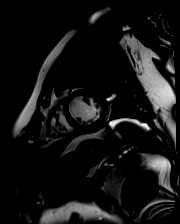
[im 165/425]
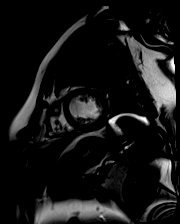
[im 189/425]
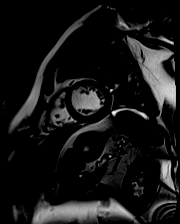
[im 213/425]
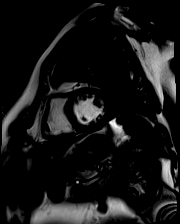
[im 236/425]
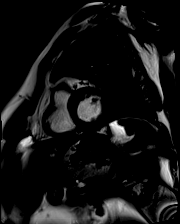
[im 260/425]
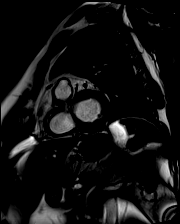
[im 283/425]
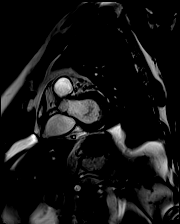
[im 307/425]
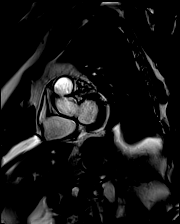
[im 330/425]
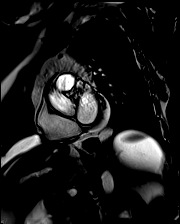
[im 354/425]
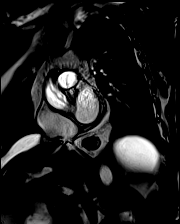
[im 377/425]
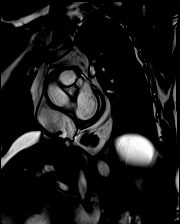
[im 401/425]
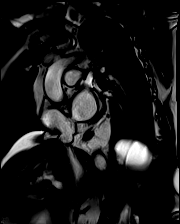
[im 425/425]
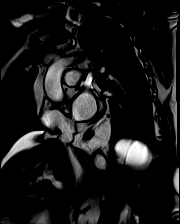

[Series 7: t2_stir_db_sax · oblique · 8.0mm · 1.73mm/px · 1 of 13 slices shown]
[im 1/13]
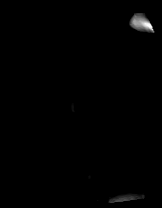

[Series 8: t2_stir_db_radial ((date)ch) · axial · 6.0mm · 1.73mm/px · 1 of 3 slices shown]
[im 1/3]
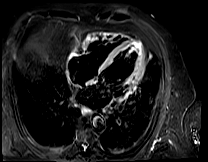

[Series 9: bSSFP · axial · 7.0mm · 1.41mm/px · 1 of 25 slices shown (2 of 4)]
[im 1/25]
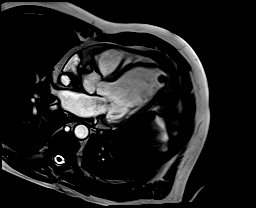

[Series 10: bSSFP · axial · 7.0mm · 1.41mm/px · 1 of 25 slices shown (3 of 4)]
[im 1/25]
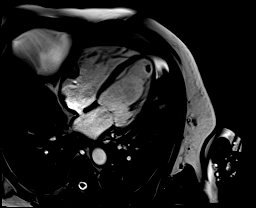

[Series 11: bSSFP · oblique · 7.0mm · 1.41mm/px · 1 of 25 slices shown (4 of 4)]
[im 1/25]
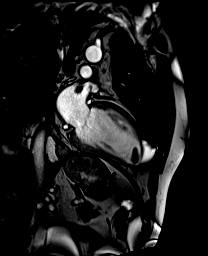

[Series 13: lge_single shot sa · oblique · 8.0mm · 1.98mm/px · 1 of 13 slices shown (1 of 2)]
[im 1/13]
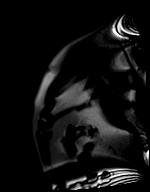

[Series 14: lge_single shot sa · oblique · 8.0mm · 1.98mm/px · 1 of 13 slices shown (2 of 2)]
[im 1/13]
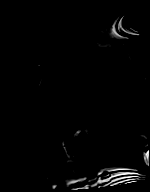

[Series 15: lge_single shot radial_mag · axial · 6.0mm · 1.98mm/px · 1 of 1 slices shown (1 of 2)]
[im 1/1]
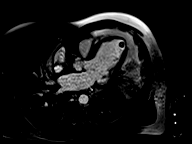

[Series 16: lge_single shot radial_psir · axial · 6.0mm · 1.98mm/px · 1 of 1 slices shown (1 of 2)]
[im 1/1]
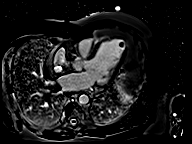

[Series 17: lge_single shot radial_mag · axial · 6.0mm · 1.98mm/px · 1 of 1 slices shown (2 of 2)]
[im 1/1]
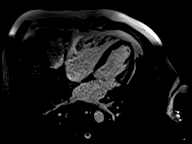

[Series 18: lge_single shot radial_psir · axial · 6.0mm · 1.98mm/px · 1 of 1 slices shown (2 of 2)]
[im 1/1]
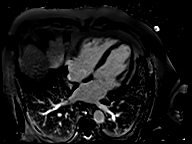

[Series 22: lge short axis_mag · oblique · 8.0mm · 1.61mm/px · 1 of 13 slices shown]
[im 1/13]
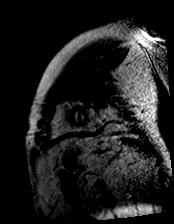

[Series 23: lge short axis_psir · oblique · 8.0mm · 1.61mm/px · 1 of 13 slices shown]
[im 1/13]
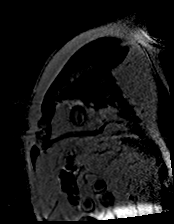

[Series 24: lge radial ((date)ch)_mag · axial · 6.0mm · 1.61mm/px · 1 of 1 slices shown (1 of 2)]
[im 1/1]
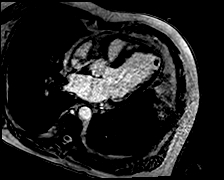

[Series 25: lge radial ((date)ch)_psir · axial · 6.0mm · 1.61mm/px · 1 of 1 slices shown (1 of 2)]
[im 1/1]
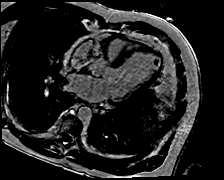

[Series 26: lge radial ((date)ch)_mag · axial · 6.0mm · 1.61mm/px · 1 of 1 slices shown (2 of 2)]
[im 1/1]
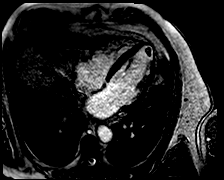

[Series 27: lge radial ((date)ch)_psir · axial · 6.0mm · 1.61mm/px · 1 of 1 slices shown (2 of 2)]
[im 1/1]
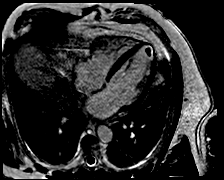

[Series 30: thrombus long ti_mag · oblique · 6.0mm · 1.98mm/px · 1 of 5 slices shown]
[im 1/5]
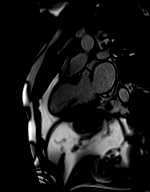

[Series 31: thrombus long ti_psir · oblique · 6.0mm · 1.98mm/px · 1 of 5 slices shown]
[im 1/5]
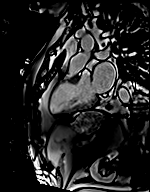

[38 of 40 positions shown; findings below may reference images not displayed]

FINDINGS: 1. Mildly dilated left ventricle with normal wall thickness and
mildly decreased systolic function (LVEF = 49%). There is
hypokinesis in the mid anterior, anteroseptal and apical anterior
and septal walls. There is an apical thrombus measuring 22 x 17 x 11
mm. There is 50-75% late gadolinium enhancement in the myocardium of
the mid anterior, anteroseptal and apical anterior and septal walls.

LVEDD: 57 mm

LVESD: 45 mm

LVEDV: 189 ml

LVESV: 97 ml

SV: 92 ml

CO: 4.8 L/min

Myocardial mass: 124 g

2. Normal right ventricular size, thickness and systolic function
(LVEF = 59%). There are no regional wall motion abnormalities.

3.  Mildly dilated left atrium, normal right atrial size.

4. Normal size of the aortic root, ascending aorta and pulmonary
artery.

5.  Mild mitral regurgitation.

6.  Normal pericardium.  No pericardial effusion.
IMPRESSION: 1. Mildly dilated left ventricle with normal wall thickness and
mildly decreased systolic function (LVEF = 49%). There is
hypokinesis in the mid anterior, anteroseptal and apical anterior
and septal walls. There is 50-75% late gadolinium enhancement in the
myocardium of the mid anterior, anteroseptal and apical anterior and
septal walls with poor prognosis of recovery if revascularized ([DATE]
segments). All other segments are viable.

There is an apical thrombus measuring 22 x 17 x 11 mm. An
anticoagulation with coumadin is recommended.

2. Normal right ventricular size, thickness and systolic function
(LVEF = 59%). There are no regional wall motion abnormalities.

3.  Mildly dilated left atrium, normal right atrial size.

4. Mild mitral regurgitation.

## 2018-09-28 MED ORDER — GADOBUTROL 1 MMOL/ML IV SOLN
8.0000 mL | Freq: Once | INTRAVENOUS | Status: AC | PRN
  Administered 2018-09-28: 8 mL via INTRAVENOUS

## 2018-09-29 ENCOUNTER — Ambulatory Visit (INDEPENDENT_AMBULATORY_CARE_PROVIDER_SITE_OTHER): Payer: Medicare Other | Admitting: Internal Medicine

## 2018-09-29 ENCOUNTER — Other Ambulatory Visit (INDEPENDENT_AMBULATORY_CARE_PROVIDER_SITE_OTHER): Payer: Medicare Other

## 2018-09-29 ENCOUNTER — Encounter: Payer: Self-pay | Admitting: Internal Medicine

## 2018-09-29 VITALS — Ht 66.0 in

## 2018-09-29 DIAGNOSIS — E538 Deficiency of other specified B group vitamins: Secondary | ICD-10-CM | POA: Diagnosis not present

## 2018-09-29 DIAGNOSIS — D61811 Other drug-induced pancytopenia: Secondary | ICD-10-CM | POA: Diagnosis not present

## 2018-09-29 DIAGNOSIS — Z79899 Other long term (current) drug therapy: Secondary | ICD-10-CM

## 2018-09-29 DIAGNOSIS — K5010 Crohn's disease of large intestine without complications: Secondary | ICD-10-CM

## 2018-09-29 DIAGNOSIS — K219 Gastro-esophageal reflux disease without esophagitis: Secondary | ICD-10-CM

## 2018-09-29 DIAGNOSIS — K5090 Crohn's disease, unspecified, without complications: Secondary | ICD-10-CM

## 2018-09-29 LAB — COMPREHENSIVE METABOLIC PANEL
ALT: 25 U/L (ref 0–53)
AST: 20 U/L (ref 0–37)
Albumin: 4.6 g/dL (ref 3.5–5.2)
Alkaline Phosphatase: 82 U/L (ref 39–117)
BUN: 16 mg/dL (ref 6–23)
CO2: 24 mEq/L (ref 19–32)
Calcium: 8.9 mg/dL (ref 8.4–10.5)
Chloride: 107 mEq/L (ref 96–112)
Creatinine, Ser: 1.19 mg/dL (ref 0.40–1.50)
GFR: 60.58 mL/min (ref >60.00)
Glucose, Bld: 90 mg/dL (ref 70–99)
Potassium: 4 mEq/L (ref 3.5–5.1)
Sodium: 140 mEq/L (ref 135–145)
Total Bilirubin: 0.8 mg/dL (ref 0.2–1.2)
Total Protein: 7 g/dL (ref 6.0–8.3)

## 2018-09-29 LAB — CBC WITH DIFFERENTIAL/PLATELET
Basophils Absolute: 0 10*3/uL (ref 0.0–0.1)
Basophils Relative: 0.3 % (ref 0.0–3.0)
Eosinophils Absolute: 0.1 10*3/uL (ref 0.0–0.7)
Eosinophils Relative: 2 % (ref 0.0–5.0)
HCT: 28.9 % — ABNORMAL LOW (ref 39.0–52.0)
Hemoglobin: 10.2 g/dL — ABNORMAL LOW (ref 13.0–17.0)
Lymphocytes Relative: 36.5 % (ref 12.0–46.0)
Lymphs Abs: 1 10*3/uL (ref 0.7–4.0)
MCHC: 35.2 g/dL (ref 30.0–36.0)
MCV: 101.1 fl — ABNORMAL HIGH (ref 78.0–100.0)
Monocytes Absolute: 0.3 10*3/uL (ref 0.1–1.0)
Monocytes Relative: 9.6 % (ref 3.0–12.0)
Neutro Abs: 1.4 10*3/uL (ref 1.4–7.7)
Neutrophils Relative %: 51.6 % (ref 43.0–77.0)
Platelets: 228 10*3/uL (ref 150.0–400.0)
RBC: 2.86 Mil/uL — ABNORMAL LOW (ref 4.22–5.81)
RDW: 20.9 % — ABNORMAL HIGH (ref 11.5–15.5)
WBC: 2.8 10*3/uL — ABNORMAL LOW (ref 4.0–10.5)

## 2018-09-29 NOTE — Progress Notes (Signed)
Subjective:    Patient ID: Darryl [name], male    DOB: May 04, 1950, 68 y.o.   MRN: [id number]  HPI Darryl [name] is a 68 yo male with PMH of Crohn's colitis for greater than 2 decades previously managed on 6-MP, history of GERD, B12 deficiency and history of sessile serrated polyps who is here for follow-up.  He was hospitalized on 09/21/2018 where he was found to have his profound anemia, heme-negative stool.  At that time he was pancytopenic with etiology felt secondary to 6-MP.  He received blood transfusion and the medication was stopped.  He presents today for follow-up in person.  He reports that he is now feeling much better.  He reports he feels as good now as he has been several months.  He has better energy levels.  No shortness of breath or chest pain.  No abdominal pain.  He is having approximately 2 bowel movements a day usually in the morning.  These are loose but not diarrhea.  No blood in his stool or melena.  Good appetite.  He is sleeping better.  Energy levels are better and he is ready to play golf again.  He has been off of his 6-MP and allopurinol.  GERD symptoms are well controlled on pantoprazole.  He had a cardiac MRI yesterday and follows up with Dr. Ellyn Hack next week.  We reviewed lab work.  We increased 6-MP from 50 mg alternating with 75 mg every other day to 75 mg daily along with allopurinol in May 2020.  He has lost about 15 pounds since his myocardial infarction.  This is through diet and exercise.   Review of Systems As per HPI, otherwise negative  Current Medications, Allergies, Past Medical History, Past Surgical History, Family History and Social History were reviewed in [employer] record.     Objective:   Physical Exam Ht 5' 6"  (1.676 m)   BMI 28.05 kg/m  Gen: awake, alert, NAD HEENT: anicteric, op clear CV: RRR, no mrg Pulm: CTA b/l Abd: soft, NT/ND, +BS throughout Ext: no c/c/e Neuro: nonfocal  CBC Latest Ref Rng &  Units 09/23/2018 09/22/2018 09/22/2018  WBC 4.0 - 10.5 K/uL 3.0(L) - 2.5(L)  Hemoglobin 13.0 - 17.0 g/dL 8.5(L) 8.6(L) 6.2(LL)  Hematocrit 39.0 - 52.0 % 24.1(L) 24.3(L) 17.6(L)  Platelets 150 - 400 K/uL 131(L) - 130(L)  -pancytopenia on review of peripheral smear  TSH 0.679  Thiopurine metabolites -- 09/21/2018 -- 6-TG 774; 6-MMP 11,806 (73m was increased to 75 mg daly on 07/28/2018 after 6TG a bit low and 6-MMP <500)      Assessment & Plan:   68yo male with PMH of Crohn's colitis for greater than 2 decades previously managed on 6-MP, history of GERD, B12 deficiency and history of sessile serrated polyps who is here for follow-up.   1.  Recent profound anemia and pancytopenia due to 6-MP therapy plus allopurinol --he had bone marrow suppression due to 6-MP and allopurinol therapy resulting in a significant anemia requiring transfusion.  These medications have been stopped.  We spent time today discussing that prior to increasing doses his levels of 6-MP were subtherapeutic and he had had a flare of his Crohn's colitis.  We will need to avoid this medication going forward as we were not able to achieve an adequate level and when dose was raised he developed bone marrow suppression.  Follow blood counts closely as they recover. --Check CBC today  2.  Crohn's colitis --longstanding with  a flare earlier in 2020 responding to budesonide/Uceris.  We have had issues with pancytopenia due to his 6-MP therapy, see above.  We spent considerable time today discussing other options for maintenance of remission for his Crohn's disease.  I am concerned if we do not resume a chronic therapy to control his colitis that he will have recurrent issues going forward.  He is in agreement.  We spent time today discussing biologic therapy specifically with anti-TNF therapy such as Remicade and Humira versus Entyvio. We also discussed the risks in great detail including the risk of infection (including reactivation of latent  TB and underlying viral hepatitis), pancreatitis, rash, nausea, malignancy (specifically lymphoma), demyelinating disease, and even heart failure.  We also discussed that with Entyvio there is a theoretical risk of PML. --After our discussion he would like to pursue Humira therapy.  I would like to wait until around October 24, 2018 before beginning new therapy as he is recovering from bone marrow suppression due to 6-MP toxicity. --We will check CMP, hepatitis B surface antigen and antibody, hepatitis B core total antibody, hepatitis C antibody and a QuantiFERON gold today. --Surveillance colonoscopy will be due in July 2021  3.  B12 deficiency --continue oral B12  4.  GERD --well-controlled on pantoprazole.  Continue current dose  2 to 42-monthfollow-up, sooner if needed  25 minutes spent with the patient today. Greater than 50% was spent in counseling and coordination of care with the patient

## 2018-09-29 NOTE — Patient Instructions (Signed)
Your provider has requested that you go to the basement level for lab work before leaving today. Press "B" on the elevator. The lab is located at the first door on the left as you exit the elevator.  We will get Humira process going and will be in contact with you.  If you are age 69 or older, your body mass index should be between 23-30. Your Body mass index is 28.05 kg/m. If this is out of the aforementioned range listed, please consider follow up with your Primary Care Provider.  If you are age 42 or younger, your body mass index should be between 19-25. Your Body mass index is 28.05 kg/m. If this is out of the aformentioned range listed, please consider follow up with your Primary Care Provider.

## 2018-09-30 ENCOUNTER — Other Ambulatory Visit: Payer: Self-pay

## 2018-09-30 ENCOUNTER — Telehealth: Payer: Self-pay | Admitting: Adult Health

## 2018-09-30 DIAGNOSIS — K5010 Crohn's disease of large intestine without complications: Secondary | ICD-10-CM

## 2018-09-30 NOTE — Telephone Encounter (Signed)
Spoke with patient today about results of MRI and newly found apical thrombus.  I also explained that he did not have any viable myocardium that would respond to PCI to his LAD.  The patient was also informed that this has been discussed with Dr. Ellyn Hack first before calling him.  It is Dr. Allison Quarry recommendation that the patient continue on his current medication regimen with aspirin and Brilinta for now.  He does have an appointment on October 08, 2018 with Dr. Ellyn Hack at which time they will discuss changing his medication regimen by stopping Brilinta beginning Plavix and adding Coumadin.  Of note, the patient has been taken off of Crohn's medication and will begin Humira per his GI specialist.  He was not found to have an active GI bleed.  The patient did have a repeat CBC which was completed on September 29, 2018, with a hemoglobin of 10.2.  He is going to have repeat CBC done in 2 weeks via GI.  The patient verbalized understanding of our conversation information concerning MRI and apical thrombus.  He will discuss this further with Dr. Ellyn Hack on next appointment.

## 2018-10-01 ENCOUNTER — Other Ambulatory Visit (INDEPENDENT_AMBULATORY_CARE_PROVIDER_SITE_OTHER): Payer: Medicare Other

## 2018-10-01 ENCOUNTER — Encounter: Payer: Self-pay | Admitting: Internal Medicine

## 2018-10-01 ENCOUNTER — Ambulatory Visit (INDEPENDENT_AMBULATORY_CARE_PROVIDER_SITE_OTHER): Payer: Medicare Other | Admitting: Internal Medicine

## 2018-10-01 ENCOUNTER — Other Ambulatory Visit: Payer: Self-pay

## 2018-10-01 VITALS — BP 130/70 | HR 84 | Temp 98.7°F | Resp 16 | Ht 66.0 in | Wt 176.0 lb

## 2018-10-01 DIAGNOSIS — D510 Vitamin B12 deficiency anemia due to intrinsic factor deficiency: Secondary | ICD-10-CM | POA: Diagnosis not present

## 2018-10-01 DIAGNOSIS — D61811 Other drug-induced pancytopenia: Secondary | ICD-10-CM | POA: Diagnosis not present

## 2018-10-01 DIAGNOSIS — I10 Essential (primary) hypertension: Secondary | ICD-10-CM

## 2018-10-01 DIAGNOSIS — E118 Type 2 diabetes mellitus with unspecified complications: Secondary | ICD-10-CM

## 2018-10-01 LAB — URINALYSIS, ROUTINE W REFLEX MICROSCOPIC
Bilirubin Urine: NEGATIVE
Ketones, ur: NEGATIVE
Leukocytes,Ua: NEGATIVE
Nitrite: NEGATIVE
Specific Gravity, Urine: 1.025 (ref 1.000–1.030)
Total Protein, Urine: NEGATIVE
Urine Glucose: NEGATIVE
Urobilinogen, UA: 0.2 (ref 0.0–1.0)
pH: 5.5 (ref 5.0–8.0)

## 2018-10-01 LAB — HEMOGLOBIN A1C: Hgb A1c MFr Bld: 5.9 % (ref 4.6–6.5)

## 2018-10-01 LAB — MICROALBUMIN / CREATININE URINE RATIO
Creatinine,U: 130.2 mg/dL
Microalb Creat Ratio: 3.7 mg/g (ref 0.0–30.0)
Microalb, Ur: 4.8 mg/dL — ABNORMAL HIGH (ref 0.0–1.9)

## 2018-10-01 NOTE — Progress Notes (Signed)
Subjective:  Patient ID: Darryl [name], male    DOB: 1951-01-08  Age: 68 y.o. MRN: [id number]  CC: Anemia, Hypertension, and Diabetes   HPI [name] presents for a hosp f/up -he was recently admitted for symptomatic anemia that required transfusion.  It was determined that this was caused by bone marrow suppression that was created by the combination of 6-MP and allopurinol.  He tells me he is starting to feel much better.  His recent hemoglobin had improved up to 10.2.  He denies any sources of blood loss.  He had an MI about 6 months ago.  A recent cardiac MRI showed that he had a small thrombus in his ventricle.  He is being anticoagulated by cardiology.  He was also found to be diabetic with an A1c of 6.5%.  He has since been working diligently on his lifestyle modifications with diet modifications and weight loss.  Hospital Course:  Summary of his active problems in the hospital is as following. Symptomatic macrocytic anemia.  S/p 4 units pRBC's.  Etiology unclear.  Colonoscopy 09/2017 with anal stenosis, polyps, inflammatory changes consistent with crohn, diverticulosis.  fobt negative.  LDH 107, RBC 1.75, retic ct 12.1, smear unremarkable Home meds include Brilinta and asa since MI 06/2018.  HOLD Azathioprine  HOLD Allopurinol Cont Folic acid 67m po qday Cont vitamin b12 GI consulted recommend to stay off of mercaptopurine allopurinol, but no evidence of active bleeding therefore no indication for inpatient procedures. Discussed with hematology on-call, felt the combination of Allopurinol to 6MP is the likely etiology, on chart review pts Hb has declined since march when he started Allopurinol, in addition to his chronic 6MP. Bone marrow will likely take few weeks to recover per Dr.Sherril.  Asthmastable at baseline. Cont Singulair 124mpo qday Cont Advair 2puff bid => Dulera 2puff bid Cont Albuterol HFA 2puff q6h prn   Gerd Cont PPI  CADs/p NSTEMI 06/2018 with  stents. No chest pain. Of note has a cardiac MRI scheduled 7/6. Cont Brillinta 9057mo bid Cont Crestor 55m84m qday Probably not able to tolerate b blocker due to orthostatic hypotension Cardiology consulted.  Recommend outpatient follow-up and outpatient work-up with the MRI.  Orthostatic hypotensionhx of same. Cont Midodrine 2.5mg 20mqday No further positive orthostatics in the hospital.  Crohns diseaseappears stable at baseline. Reports meds changed recently due to elevated LFT. Total bili slightly elevated to day. HOLD Azathioprine/ Allopurinol as above Cont Budesonide 9mg p35mday Appreciate gi assistance  Patient was ambulatory without any assistance. On the day of the discharge the patient's vitals were stable , and no other acute medical condition were reported by patient. the patient was felt safe to be discharge at Home with family.  Consultants: gastroenterology cardiology  Procedures: none  Outpatient Medications Prior to Visit  Medication Sig Dispense Refill  . fluticasone-salmeterol (ADVAIR HFA) 115-21 MCG/ACT inhaler USE 2 PUFFS TWICE DAILY AS NEEDED. SHAKE WELL (Patient taking differently: Inhale 2 puffs into the lungs 2 (two) times daily. ) 12 g 1  . folic acid (FOLVITE) 1 MG tablet TAKE 1 TABLET ONCE DAILY. (Patient taking differently: Take 1 mg by mouth daily. ) 90 tablet 1  . hydrocortisone 2.5 % cream Apply 1 application topically daily.     . lopeMarland Kitchenamide (IMODIUM A-D) 2 MG tablet Take 2 mg by mouth 4 (four) times daily as needed for diarrhea or loose stools.    . midodrine (PROAMATINE) 2.5 MG tablet Take 2.5 mg every morning ,  may take an additional dose after 6 hr if still dizzy or for systloic BP less than 95 bpm with dizziness. 40 tablet 2  . montelukast (SINGULAIR) 10 MG tablet TAKE ONE TABLET AT BEDTIME. (Patient taking differently: Take 10 mg by mouth at bedtime. ) 90 tablet 1  . Multiple Vitamins-Minerals (CENTRUM SILVER PO) Take 1 tablet by mouth  daily.    . nitroGLYCERIN (NITROSTAT) 0.4 MG SL tablet Place 1 tablet (0.4 mg total) under the tongue every 5 (five) minutes x 3 doses as needed for chest pain. 25 tablet 3  . Omega-3 Fatty Acids (DIALYVITE OMEGA-3 CONCENTRATE) 600 MG CAPS Take 1 tablet by mouth 2 (two) times daily.    . pantoprazole (PROTONIX) 40 MG tablet Take 1 tablet (40 mg total) by mouth daily. 90 tablet 1  . rosuvastatin (CRESTOR) 40 MG tablet Take 1 tablet (40 mg total) by mouth daily. 90 tablet 3  . ticagrelor (BRILINTA) 90 MG TABS tablet Take 1 tablet (90 mg total) by mouth 2 (two) times daily. 60 tablet 11  . traMADol (ULTRAM) 50 MG tablet TAKE ONE TABLET EVERY 6 HOURS AS NEEDED. (Patient taking differently: Take 50 mg by mouth every 6 (six) hours as needed (pain). ) 30 tablet 0  . VENTOLIN HFA 108 (90 Base) MCG/ACT inhaler USE 2 PUFFS EVERY 6 HOURS AS NEEDED FOR WHEEZING. **SHAKE WELL** (Patient taking differently: Inhale 2 puffs into the lungs every 6 (six) hours as needed for wheezing. ) 18 g 0  . vitamin B-12 (CYANOCOBALAMIN) 1000 MCG tablet Take 1 tablet (1,000 mcg total) by mouth daily.     No facility-administered medications prior to visit.     ROS Review of Systems  Constitutional: Negative.  Negative for diaphoresis, fatigue and unexpected weight change.  HENT: Negative.   Eyes: Negative for visual disturbance.  Respiratory: Negative for cough, chest tightness, shortness of breath and wheezing.   Cardiovascular: Negative for chest pain, palpitations and leg swelling.  Gastrointestinal: Negative for abdominal pain, blood in stool, constipation, diarrhea, nausea and vomiting.  Endocrine: Negative for polydipsia, polyphagia and polyuria.  Genitourinary: Negative.  Negative for difficulty urinating and hematuria.  Musculoskeletal: Negative.  Negative for arthralgias and myalgias.  Skin: Negative.  Negative for color change and pallor.  Neurological: Negative.  Negative for dizziness, weakness and  light-headedness.  Hematological: Negative for adenopathy. Does not bruise/bleed easily.  Psychiatric/Behavioral: Negative.     Objective:  BP 130/70 (BP Location: Left Arm, Patient Position: Sitting, Cuff Size: Normal)   Pulse 84   Temp 98.7 F (37.1 C) (Oral)   Resp 16   Ht 5' 6"  (1.676 m)   Wt 176 lb (79.8 kg)   SpO2 97%   BMI 28.41 kg/m   BP Readings from Last 3 Encounters:  10/01/18 130/70  09/23/18 124/67  09/02/18 (!) 110/58    Wt Readings from Last 3 Encounters:  10/01/18 176 lb (79.8 kg)  09/23/18 173 lb 12.8 oz (78.8 kg)  09/02/18 186 lb 1.6 oz (84.4 kg)    Physical Exam Vitals signs reviewed.  Constitutional:      Appearance: He is obese. He is not ill-appearing or diaphoretic.  HENT:     Mouth/Throat:     Mouth: Mucous membranes are moist.  Eyes:     General: No scleral icterus.    Conjunctiva/sclera: Conjunctivae normal.  Neck:     Musculoskeletal: Normal range of motion. No neck rigidity or muscular tenderness.  Cardiovascular:     Rate and Rhythm:  Normal rate and regular rhythm.     Heart sounds: No murmur.  Pulmonary:     Effort: Pulmonary effort is normal.     Breath sounds: No stridor. No wheezing, rhonchi or rales.  Abdominal:     General: Abdomen is protuberant. Bowel sounds are normal. There is no distension.     Palpations: Abdomen is soft. There is no hepatomegaly, splenomegaly or mass.     Tenderness: There is no abdominal tenderness.  Musculoskeletal: Normal range of motion.     Right lower leg: No edema.     Left lower leg: No edema.  Lymphadenopathy:     Cervical: No cervical adenopathy.  Skin:    General: Skin is warm and dry.     Coloration: Skin is not pale.  Neurological:     General: No focal deficit present.     Mental Status: He is alert.  Psychiatric:        Mood and Affect: Mood normal.        Behavior: Behavior normal.     Lab Results  Component Value Date   WBC 2.8 (L) 09/29/2018   HGB 10.2 (L) 09/29/2018    HCT 28.9 (L) 09/29/2018   PLT 228.0 09/29/2018   GLUCOSE 90 09/29/2018   CHOL 117 07/24/2018   TRIG 72 07/24/2018   HDL 64 07/24/2018   LDLCALC 39 07/24/2018   ALT 25 09/29/2018   AST 20 09/29/2018   NA 140 09/29/2018   K 4.0 09/29/2018   CL 107 09/29/2018   CREATININE 1.19 09/29/2018   BUN 16 09/29/2018   CO2 24 09/29/2018   TSH 0.679 09/23/2018   PSA 0.38 02/23/2018   INR 1.0 07/23/2018   HGBA1C 5.9 10/01/2018   MICROALBUR 4.8 (H) 10/01/2018    Mr Card Morphology Wo/w Cm  Result Date: 09/29/2018 CLINICAL DATA:  68 year old male with known CAD, ischemic cardiomyopathy, known occluded LAD. Evaluate for viability. EXAM: CARDIAC MRI TECHNIQUE: The patient was scanned on a 1.5 Tesla GE magnet. A dedicated cardiac coil was used. Functional imaging was done using Fiesta sequences. 2,3, and 4 chamber views were done to assess for RWMA's. Modified Simpson's rule using a short axis stack was used to calculate an ejection fraction on a dedicated work Conservation officer, nature. The patient received 10 cc of Gadavist. After 10 minutes inversion recovery sequences were used to assess for infiltration and scar tissue. CONTRAST:  10 cc  of Gadavist FINDINGS: 1. Mildly dilated left ventricle with normal wall thickness and mildly decreased systolic function (LVEF = 49%). There is hypokinesis in the mid anterior, anteroseptal and apical anterior and septal walls. There is an apical thrombus measuring 22 x 17 x 11 mm. There is 50-75% late gadolinium enhancement in the myocardium of the mid anterior, anteroseptal and apical anterior and septal walls. LVEDD: 57 mm LVESD: 45 mm LVEDV: 189 ml LVESV: 97 ml SV: 92 ml CO: 4.8 L/min Myocardial mass: 124 g 2. Normal right ventricular size, thickness and systolic function (LVEF = 59%). There are no regional wall motion abnormalities. 3.  Mildly dilated left atrium, normal right atrial size. 4. Normal size of the aortic root, ascending aorta and pulmonary artery. 5.   Mild mitral regurgitation. 6.  Normal pericardium.  No pericardial effusion. IMPRESSION: 1. Mildly dilated left ventricle with normal wall thickness and mildly decreased systolic function (LVEF = 49%). There is hypokinesis in the mid anterior, anteroseptal and apical anterior and septal walls. There is 50-75% late gadolinium enhancement  in the myocardium of the mid anterior, anteroseptal and apical anterior and septal walls with poor prognosis of recovery if revascularized (4/17 segments). All other segments are viable. There is an apical thrombus measuring 22 x 17 x 11 mm. An anticoagulation with coumadin is recommended. 2. Normal right ventricular size, thickness and systolic function (LVEF = 59%). There are no regional wall motion abnormalities. 3.  Mildly dilated left atrium, normal right atrial size. 4. Mild mitral regurgitation. Electronically Signed   By: Ena Dawley   On: 09/29/2018 16:41    Assessment & Plan:   Jensyn was seen today for anemia, hypertension and diabetes.  Diagnoses and all orders for this visit:  Essential hypertension, benign- His blood pressure is adequately well controlled. -     Urinalysis, Routine w reflex microscopic; Future  Type II diabetes mellitus with manifestations ([place])- His A1c is at 5.9%.  He has good glycemic control. -     HM Diabetes Foot Exam -     Hemoglobin A1c; Future -     Microalbumin / creatinine urine ratio; Future  Vitamin B12 deficiency anemia due to intrinsic factor deficiency- Will continue B12 replacement therapy.  I will monitor his thiamine level. -     Vitamin B1; Future   I am having Karl Pock "Philipp Ovens" maintain his vitamin J-68, folic acid, hydrocortisone, Dialyvite Omega-3 Concentrate, Multiple Vitamins-Minerals (CENTRUM SILVER PO), loperamide, montelukast, traMADol, nitroGLYCERIN, rosuvastatin, ticagrelor, midodrine, Advair HFA, Ventolin HFA, and pantoprazole.  No orders of the defined types were placed in this  encounter.    Follow-up: Return in about 6 months (around 04/03/2019).  Scarlette Calico, MD

## 2018-10-01 NOTE — Patient Instructions (Signed)
Type 2 Diabetes Mellitus, Diagnosis, Adult Type 2 diabetes (type 2 diabetes mellitus) is a long-term (chronic) disease. In type 2 diabetes, one or both of these problems may be present:  The pancreas does not make enough of a hormone called insulin.  Cells in the body do not respond properly to insulin that the body makes (insulin resistance). Normally, insulin allows blood sugar (glucose) to enter cells in the body. The cells use glucose for energy. Insulin resistance or lack of insulin causes excess glucose to build up in the blood instead of going into cells. As a result, high blood glucose (hyperglycemia) develops. What increases the risk? The following factors may make you more likely to develop type 2 diabetes:  Having a family member with type 2 diabetes.  Being overweight or obese.  Having an inactive (sedentary) lifestyle.  Having been diagnosed with insulin resistance.  Having a history of prediabetes, gestational diabetes, or polycystic ovary syndrome (PCOS).  Being of American-Indian, African-American, Hispanic/Latino, or Asian/Pacific Islander descent. What are the signs or symptoms? In the early stage of this condition, you may not have symptoms. Symptoms develop slowly and may include:  Increased thirst (polydipsia).  Increased hunger(polyphagia).  Increased urination (polyuria).  Increased urination during the night (nocturia).  Unexplained weight loss.  Frequent infections that keep coming back (recurring).  Fatigue.  Weakness.  Vision changes, such as blurry vision.  Cuts or bruises that are slow to heal.  Tingling or numbness in the hands or feet.  Dark patches on the skin (acanthosis nigricans). How is this diagnosed? This condition is diagnosed based on your symptoms, your medical history, a physical exam, and your blood glucose level. Your blood glucose may be checked with one or more of the following blood tests:  A fasting blood glucose (FBG)  test. You will not be allowed to eat (you will fast) for 8 hours or longer before a blood sample is taken.  A random blood glucose test. This test checks blood glucose at any time of day regardless of when you ate.  An A1c (hemoglobin A1c) blood test. This test provides information about blood glucose control over the previous 2-3 months.  An oral glucose tolerance test (OGTT). This test measures your blood glucose at two times: ? After fasting. This is your baseline blood glucose level. ? Two hours after drinking a beverage that contains glucose. You may be diagnosed with type 2 diabetes if:  Your FBG level is 126 mg/dL (7.0 mmol/L) or higher.  Your random blood glucose level is 200 mg/dL (11.1 mmol/L) or higher.  Your A1c level is 6.5% or higher.  Your OGTT result is higher than 200 mg/dL (11.1 mmol/L). These blood tests may be repeated to confirm your diagnosis. How is this treated? Your treatment may be managed by a specialist called an endocrinologist. Type 2 diabetes may be treated by following instructions from your health care provider about:  Making diet and lifestyle changes. This may include: ? Following an individualized nutrition plan that is developed by a diet and nutrition specialist (registered dietitian). ? Exercising regularly. ? Finding ways to manage stress.  Checking your blood glucose level as often as told.  Taking diabetes medicines or insulin daily. This helps to keep your blood glucose levels in the healthy range. ? If you use insulin, you may need to adjust the dosage depending on how physically active you are and what foods you eat. Your health care provider will tell you how to adjust your dosage.    Taking medicines to help prevent complications from diabetes, such as: ? Aspirin. ? Medicine to lower cholesterol. ? Medicine to control blood pressure. Your health care provider will set individualized treatment goals for you. Your goals will be based on  your age, other medical conditions you have, and how you respond to diabetes treatment. Generally, the goal of treatment is to maintain the following blood glucose levels:  Before meals (preprandial): 80-130 mg/dL (4.4-7.2 mmol/L).  After meals (postprandial): below 180 mg/dL (10 mmol/L).  A1c level: less than 7%. Follow these instructions at home: Questions to ask your health care provider  Consider asking the following questions: ? Do I need to meet with a diabetes educator? ? Where can I find a support group for people with diabetes? ? What equipment will I need to manage my diabetes at home? ? What diabetes medicines do I need, and when should I take them? ? How often do I need to check my blood glucose? ? What number can I call if I have questions? ? When is my next appointment? General instructions  Take over-the-counter and prescription medicines only as told by your health care provider.  Keep all follow-up visits as told by your health care provider. This is important.  For more information about diabetes, visit: ? American Diabetes Association (ADA): www.diabetes.org ? American Association of Diabetes Educators (AADE): www.diabeteseducator.org Contact a health care provider if:  Your blood glucose is at or above 240 mg/dL (13.3 mmol/L) for 2 days in a row.  You have been sick or have had a fever for 2 days or longer, and you are not getting better.  You have any of the following problems for more than 6 hours: ? You cannot eat or drink. ? You have nausea and vomiting. ? You have diarrhea. Get help right away if:  Your blood glucose is lower than 54 mg/dL (3.0 mmol/L).  You become confused or you have trouble thinking clearly.  You have difficulty breathing.  You have moderate or large ketone levels in your urine. Summary  Type 2 diabetes (type 2 diabetes mellitus) is a long-term (chronic) disease. In type 2 diabetes, the pancreas does not make enough of a  hormone called insulin, or cells in the body do not respond properly to insulin that the body makes (insulin resistance).  This condition is treated by making diet and lifestyle changes and taking diabetes medicines or insulin.  Your health care provider will set individualized treatment goals for you. Your goals will be based on your age, other medical conditions you have, and how you respond to diabetes treatment.  Keep all follow-up visits as told by your health care provider. This is important. This information is not intended to replace advice given to you by your health care provider. Make sure you discuss any questions you have with your health care provider. Document Released: 03/11/2005 Document Revised: 05/09/2017 Document Reviewed: 04/14/2015 Elsevier Patient Education  2020 [employer]  

## 2018-10-05 ENCOUNTER — Telehealth (HOSPITAL_COMMUNITY): Payer: Self-pay

## 2018-10-05 DIAGNOSIS — D61811 Other drug-induced pancytopenia: Secondary | ICD-10-CM | POA: Insufficient documentation

## 2018-10-05 DIAGNOSIS — D510 Vitamin B12 deficiency anemia due to intrinsic factor deficiency: Secondary | ICD-10-CM | POA: Insufficient documentation

## 2018-10-05 LAB — QUANTIFERON-TB GOLD PLUS

## 2018-10-05 LAB — HEPATITIS B SURFACE ANTIBODY,QUALITATIVE: Hep B S Ab: NONREACTIVE

## 2018-10-05 LAB — HEPATITIS C ANTIBODY
Hepatitis C Ab: NONREACTIVE
SIGNAL TO CUT-OFF: 0.01 (ref <1.00)

## 2018-10-05 LAB — HEPATITIS B CORE ANTIBODY, TOTAL: Hep B Core Total Ab: NONREACTIVE

## 2018-10-05 LAB — HEPATITIS B SURFACE ANTIGEN: Hepatitis B Surface Ag: NONREACTIVE

## 2018-10-05 NOTE — Assessment & Plan Note (Signed)
His recent CBC showed marked improvement since the 6-MP and allopurinol have been discontinued. Will continue to monitor and expect continued improvement.

## 2018-10-05 NOTE — Telephone Encounter (Signed)
Pt insurance is active and benefits verified through [facility]. Co-pay $20.00, DED $0.00/$0.00 met, out of pocket $3,300.00/$476.93 met, co-insurance 0%. No pre-authorization required. Passport, 10/05/2018 @ 3:47PM, KWI#[id number]  Patient will need to complete follow up appt. Once completed, patient will be contacted for scheduling upon review by the RN Navigator.

## 2018-10-06 ENCOUNTER — Telehealth: Payer: Self-pay | Admitting: *Deleted

## 2018-10-06 DIAGNOSIS — Z79899 Other long term (current) drug therapy: Secondary | ICD-10-CM

## 2018-10-06 DIAGNOSIS — K5010 Crohn's disease of large intestine without complications: Secondary | ICD-10-CM

## 2018-10-06 NOTE — Telephone Encounter (Signed)
I have spoken to patient to advise that he will need hepatitis B vaccine series as he is not immune to hepatitis B. In addition, we will need to redraw his quantiferon gold as it was previously cancelled at the lab. Patient verbalizes understanding and has schedule Hepatitis B injection #1 on 10/12/18 at 9 am. He will go for lab redraw that day as well.

## 2018-10-06 NOTE — Addendum Note (Signed)
Addended by: Larina Bras on: 10/06/2018 11:42 AM   Modules accepted: Orders

## 2018-10-06 NOTE — Telephone Encounter (Signed)
-----   Message from Jerene Bears, MD sent at 10/05/2018  5:56 PM EDT ----- No evidence for viral hepatitis; he is not immune to hepatitis B and I recommend the hepatitis B vaccine series if he is agreeable QuantiFERON gold will need to be redrawn, reportedly the specimen was "not suitable"

## 2018-10-07 ENCOUNTER — Encounter: Payer: Self-pay | Admitting: Internal Medicine

## 2018-10-07 LAB — VITAMIN B1: Vitamin B1 (Thiamine): 10 nmol/L (ref 8–30)

## 2018-10-08 ENCOUNTER — Ambulatory Visit (INDEPENDENT_AMBULATORY_CARE_PROVIDER_SITE_OTHER): Payer: Medicare Other | Admitting: Cardiology

## 2018-10-08 ENCOUNTER — Encounter: Payer: Self-pay | Admitting: Cardiology

## 2018-10-08 ENCOUNTER — Telehealth: Payer: Self-pay | Admitting: Cardiology

## 2018-10-08 ENCOUNTER — Other Ambulatory Visit: Payer: Self-pay

## 2018-10-08 DIAGNOSIS — E785 Hyperlipidemia, unspecified: Secondary | ICD-10-CM | POA: Diagnosis not present

## 2018-10-08 DIAGNOSIS — I236 Thrombosis of atrium, auricular appendage, and ventricle as current complications following acute myocardial infarction: Secondary | ICD-10-CM | POA: Diagnosis not present

## 2018-10-08 DIAGNOSIS — I255 Ischemic cardiomyopathy: Secondary | ICD-10-CM | POA: Diagnosis not present

## 2018-10-08 DIAGNOSIS — Z9861 Coronary angioplasty status: Secondary | ICD-10-CM

## 2018-10-08 DIAGNOSIS — I2510 Atherosclerotic heart disease of native coronary artery without angina pectoris: Secondary | ICD-10-CM | POA: Diagnosis not present

## 2018-10-08 MED ORDER — WARFARIN SODIUM 5 MG PO TABS: 5.0000 mg | ORAL_TABLET | Freq: Every day | ORAL | 3 refills | Status: DC

## 2018-10-08 MED ORDER — CLOPIDOGREL BISULFATE 75 MG PO TABS: 75.0000 mg | ORAL_TABLET | Freq: Every day | ORAL | 3 refills | Status: DC

## 2018-10-08 NOTE — Progress Notes (Signed)
PCP: Darryl Lima, MD  Clinic Note: Chief Complaint  Patient presents with  . Follow-up    Post hospital  . Cardiomyopathy    Follow-up my MRI.  Indicates LV apical thrombus  . Coronary Artery Disease    At PCI of occluded dominant RCA was RI.  CTO of LAD    HPI: Darryl [name] is a 68 y.o. male with a PMH notable for recent inferior STEMI found to have CTO LAD (had PCI of occluded RCA followed by staged PCI of Ramus Intermedius) with ischemic cardiomyopathy below who presents today to discuss results of his cardiac MRI revealing somewhat improved EF, but LV apical thrombus.  --> He has been limited as far as medication medical management for his cardiomyopathy based on him having orthostatic hypotension requiring Midodrine  He also is being seen for the first time by me following hospitalization for anemia that was related to medication reaction to his Crohn's disease medications and not GI bleed.   Darryl [name] was last seen on by Darryl [name] on September 02, 2018 in response to an episode of chest discomfort and dyspnea.  Stop and catch his breath.  Also was noted to have reduced hemoglobin.  Up until this point he was able to walk about 3 miles a day including hills without any dyspnea or chest pain.  This has been 2 weeks of symptoms leading up to visit on June 10th.  The plan was already for him to have a cardiac MRI to evaluate for anterior wall/LAD distribution viability so we decided to go ahead and proceed with that as well.  Recent Hospitalizations:   Admitted from 6/29-7/1 2024 anemia with hemoglobin down to 5.0.  Had no evidence of melena, hematochezia or hematuria.  FOBT was negative.  Noted to have macrocytic anemia, treated with 4 units PRBC. -->  Was thought to be related to Crohn's medications (azathioprine).  Was placed back on Brilinta but no aspirin.  Was continued on Midodrine for static hypotension  Studies Personally Reviewed - (if available,  images/films reviewed: From Epic Chart or Care Everywhere)  Cardiac MRI 09/28/2018: Mildly dilated LV.  EF estimated roughly 49%.  Hypokinesis of the mid anterior, anteroseptal and apical anterior wall.  There is also apical thrombus (partially layered) 22 x 17 x 11. -->  50-75% late gadolinium enhancement of the mid anterior, anteroseptal and apical anterior and septal walls suggestive of near full-thickness scar with minimal viability (poor recovery prognosis with revascularization).  Interval History: [name] presents today a little bit tentative about what already discussed, but actually from a clinical standpoint is feeling great.  His hemoglobin levels been stable.  He is back to his walking 1-1/2-2 and half miles a day without any dyspnea or chest pain.  None of his anginal type chest pain from his MI.  No resting exertional palpitations, rapid irregular heartbeats.  No PND, orthopnea or edema. No palpitations, lightheadedness, dizziness, weakness or syncope/near syncope. No TIA/amaurosis fugax symptoms. No melena, hematochezia, hematuria, or epstaxis. No claudication.  ROS: A comprehensive was performed. Review of Systems  Constitutional: Negative for malaise/fatigue and weight loss.  HENT: Negative for congestion and nosebleeds.   Respiratory: Negative for shortness of breath.   Cardiovascular: Negative for claudication.  Gastrointestinal: Positive for abdominal pain (Pretty well controlled though). Negative for blood in stool and constipation.  Musculoskeletal: Negative for falls and myalgias.  Neurological: Positive for dizziness (Well-controlled orthostatic dizziness with Midodrine). Negative for focal weakness and weakness.  Psychiatric/Behavioral: Negative.   All other systems reviewed and are negative.  I have reviewed and (if needed) personally updated the patient's problem list, medications, allergies, past medical and surgical history, social and family history.   Past Medical  History:  Diagnosis Date  . Acute upper respiratory infections of unspecified site   . Allergic rhinitis, cause unspecified   . Anal stenosis   . Asthma   . Cataract   . Chronic orthostatic hypotension    Related to borderline hypotension; intolerant of blood pressure medications.  On Midodrine  . Coronary artery disease involving native coronary artery of native heart without angina pectoris     p-mRCA 100% (DES PCI - p-mRCA 100% (DES PCI - Resolute Onyx 3.5 x 26 --> ~3.7 mm)), dRCA ~50% & PAV 40%; pRI 85% (Staged DES PCI  Resolute Onyx 2.25 x 15 - ~2.5 mm). 100% LAD CTO after small D1 with ost-prox 60%. R-L collaterals to LAD seen post PCI.  Marland Kitchen Crohn's colitis ([place])   . Exostosis of unspecified site   . GERD (gastroesophageal reflux disease)   . History of acute inferior wall MI 06/2018   100% p-mRCA -- DES PCI (complicated by mild cardiogenic shock, partially related to 100% LAD CTO) --> staged PCI-RI  . Hyperlipidemia with target LDL less than 70 07/04/2015   Now s/p MI with MV CAD - optimal LDL < 50  . Hyperplastic colon polyp 02/26/2008  . Hypertension   . Ischemic cardiomyopathy    Initial echo shows EF of 40 to 45% with anterior hypokinesis. Cardiac MRI shows EF of 49% with anterior anteroapical hypokinesis/akinesis and LV thrombus.  . Lateral epicondylitis  of elbow   . Left ventricular thrombus following MI ([place]) 10/10/2018   Cardiac MRI 09/28/2018: Mildly dilated LV.  EF estimated roughly 49%.  Hypokinesis of the mid anterior, anteroseptal and apical anterior wall.  There is also apical thrombus (partially layered) 22 x 17 x 11.  . Leukopenia   . Macrocytic anemia    Related to azathioprine  . Nontraumatic rupture of patellar tendon   . Obesity, unspecified   . Regional enteritis of unspecified site   . Seasonal allergies   . Sessile colonic polyp   . Vitamin B12 deficiency     Past Surgical History:  Procedure Laterality Date  . CARDIAC MRI  09/2018   Mildly dilated LV.   EF estimated roughly 49%.  Hypokinesis of the mid anterior, anteroseptal and apical anterior wall.  There is also apical thrombus (partially layered) 22 x 17 x 11. -->  50-75% late gadolinium enhancement of the mid anterior, anteroseptal and apical anterior and septal walls suggestive of near full-thickness scar w/ minimal viability (poor recovery prognosis w/ REVASCULARIZATION  . COLONOSCOPY     2016  . CORONARY STENT INTERVENTION N/A 07/24/2018   Procedure: CORONARY STENT INTERVENTION;  Surgeon: Troy Sine, MD;  Location: [facility];  Service: Cardiovascular: Staged PCI pRI 85-90%: Resolute Onyx DES 2.25 x 15 (2.5 mm)  . CORONARY/GRAFT ACUTE MI REVASCULARIZATION N/A 07/23/2018   Procedure: CORONARY/GRAFT ACUTE MI REVASCULARIZATION;  Surgeon: [name], MD;  Location: [facility];  Service: Cardiovascular;::  p-mRCA 100% (DES PCI - p-mRCA 100% (DES PCI - Resolute Onyx 3.5 x 26 (post-dilated ~3.7 mm)), dRCA ~50% & PAV 40%; pRI 85% (Staged PCI). 100% LAD CTO after small D1 with ost-prox 60%. R-L collaterals to LAD seen post PCI.;   . LEFT HEART CATH N/A 07/24/2018   Procedure:  LEFT HEART CATH;  Surgeon: Troy Sine, MD;  Location: [facility];  Service: Cardiovascular;  Laterality: N/A; 85-90% pRCA, 60% ost D1 then 100% CTO of LAD -- staged DES PCI LAD  . LEFT HEART CATH AND CORONARY ANGIOGRAPHY  07/23/2018   Procedure: LEFT HEART CATH;  Surgeon: [name]., MD;  Location: [facility]   p-mRCA 100% (DES PCI),dRCA ~50% & PAV 40%; pRI 85% (Staged PCI). 100% LAD CTO after small D1 with ost-prox 60%. R-L collaterals to LAD seen post PCI.;   . RIGHT HEART CATH N/A 07/23/2018   Procedure: RIGHT HEART CATH;  Surgeon: [name], MD;  Location: [facility];  Service: Cardiovascular;  Normal RHC #s, CO/CI normal  . TONSILLECTOMY AND ADENOIDECTOMY    . TRANSTHORACIC ECHOCARDIOGRAM  07/23/2018   Insetting of inferior MI with occluded LAD CTO; --Severe  akinesis of the mid anteroseptal wall.  EF 40 and 45%.  GR 1 DD.;;      Current Meds  Medication Sig  . cholecalciferol (VITAMIN D3) 25 MCG (1000 UT) tablet Take 1,000 Units by mouth daily.  . fluticasone-salmeterol (ADVAIR HFA) 115-21 MCG/ACT inhaler USE 2 PUFFS TWICE DAILY AS NEEDED. SHAKE WELL (Patient taking differently: Inhale 2 puffs into the lungs 2 (two) times daily. )  . folic acid (FOLVITE) 1 MG tablet TAKE 1 TABLET ONCE DAILY. (Patient taking differently: Take 1 mg by mouth daily. )  . hydrocortisone 2.5 % cream Apply 1 application topically daily.   Marland Kitchen loperamide (IMODIUM A-D) 2 MG tablet Take 2 mg by mouth 4 (four) times daily as needed for diarrhea or loose stools.  . midodrine (PROAMATINE) 2.5 MG tablet Take 2.5 mg every morning , may take an additional dose after 6 hr if still dizzy or for systloic BP less than 95 bpm with dizziness.  . montelukast (SINGULAIR) 10 MG tablet TAKE ONE TABLET AT BEDTIME. (Patient taking differently: Take 10 mg by mouth at bedtime. )  . Multiple Vitamins-Minerals (CENTRUM SILVER PO) Take 1 tablet by mouth daily.  . nitroGLYCERIN (NITROSTAT) 0.4 MG SL tablet Place 1 tablet (0.4 mg total) under the tongue every 5 (five) minutes x 3 doses as needed for chest pain.  . Omega-3 Fatty Acids (DIALYVITE OMEGA-3 CONCENTRATE) 600 MG CAPS Take 1 tablet by mouth 2 (two) times daily.  . pantoprazole (PROTONIX) 40 MG tablet Take 1 tablet (40 mg total) by mouth daily.  . rosuvastatin (CRESTOR) 40 MG tablet Take 1 tablet (40 mg total) by mouth daily.  . traMADol (ULTRAM) 50 MG tablet TAKE ONE TABLET EVERY 6 HOURS AS NEEDED. (Patient taking differently: Take 50 mg by mouth every 6 (six) hours as needed (pain). )  . VENTOLIN HFA 108 (90 Base) MCG/ACT inhaler USE 2 PUFFS EVERY 6 HOURS AS NEEDED FOR WHEEZING. **SHAKE WELL** (Patient taking differently: Inhale 2 puffs into the lungs every 6 (six) hours as needed for wheezing. )  . vitamin B-12 (CYANOCOBALAMIN) 1000 MCG tablet  Take 1 tablet (1,000 mcg total) by mouth daily.  . [DISCONTINUED] ticagrelor (BRILINTA) 90 MG TABS tablet Take 1 tablet (90 mg total) by mouth 2 (two) times daily.    Allergies  Allergen Reactions  . Allopurinol Other (See Comments)    Bone marrow suppression  . Asacol [Mesalamine] Hives    Trouble swallowing, loss of appetite  . Mercaptopurine Other (See Comments)    Bone marrow suppression  . Amoxicillin Rash    Did it involve swelling  of the face/tongue/throat, SOB, or low BP? Unknown Did it involve sudden or severe rash/hives, skin peeling, or any reaction on the inside of your mouth or nose? Unknown Did you need to seek medical attention at a hospital or doctor's office? Unknown When did it last happen?b/c of penicillin allergy If all above answers are "NO", may proceed with cephalosporin use.   Marland Kitchen Penicillins Rash    Did it involve swelling of the face/tongue/throat, SOB, or low BP? Yes Did it involve sudden or severe rash/hives, skin peeling, or any reaction on the inside of your mouth or nose? No Did you need to seek medical attention at a hospital or doctor's office? No When did it last happen?more than 15 yrs If all above answers are "NO", may proceed with cephalosporin use.   . Sulfamethoxazole Rash    Social History   Tobacco Use  . Smoking status: Never Smoker  . Smokeless tobacco: Never Used  Substance Use Topics  . Alcohol use: Yes    Alcohol/week: 0.0 standard drinks    Comment: rare  . Drug use: No   Social History   Social History Narrative  . Not on file    family history includes Diabetes in his brother and father; Heart attack in his father; Heart disease in his father; Hypertension in his father.  Wt Readings from Last 3 Encounters:  10/08/18 178 lb 6.4 oz (80.9 kg)  10/01/18 176 lb (79.8 kg)  09/23/18 173 lb 12.8 oz (78.8 kg)    PHYSICAL EXAM BP 125/77   Pulse 83   Temp (!) 97.5 F (36.4 C)   Ht 5' 6"  (1.676 m)   Wt 178 lb  6.4 oz (80.9 kg)   SpO2 97%   BMI 28.79 kg/m  Physical Exam  Constitutional: He is oriented to person, place, and time. He appears well-developed and well-nourished.  Healthy-appearing gentleman.  No acute distress.  Well-groomed  HENT:  Head: Normocephalic and atraumatic.  Eyes: EOM are normal.  Neck: Normal range of motion. Neck supple. No hepatojugular reflux and no JVD present. Carotid bruit is not present. No thyromegaly present.  Cardiovascular: Normal rate, regular rhythm, normal heart sounds, intact distal pulses and normal pulses.  No extrasystoles are present. PMI is not displaced. Exam reveals no gallop and no friction rub.  No murmur heard. Pulmonary/Chest: Effort normal and breath sounds normal. No respiratory distress. He has no wheezes. He has no rales.  Abdominal: Soft. Bowel sounds are normal. He exhibits no distension. There is no abdominal tenderness. There is no rebound.  Musculoskeletal: Normal range of motion.        General: No edema.  Neurological: He is alert and oriented to person, place, and time.  Skin: Skin is warm and dry.  Psychiatric: He has a normal mood and affect. His behavior is normal. Judgment and thought content normal.  Vitals reviewed.   Adult ECG Report Not checked  Other studies Reviewed: Additional studies/ records that were reviewed today include:  Recent Labs:   Lab Results  Component Value Date   HGBA1C 5.9 10/01/2018    CBC Latest Ref Rng & Units 09/29/2018 09/23/2018 09/22/2018  WBC 4.0 - 10.5 K/uL 2.8(L) 3.0(L) -  Hemoglobin 13.0 - 17.0 g/dL 10.2(L) 8.5(L) 8.6(L)  Hematocrit 39.0 - 52.0 % 28.9(L) 24.1(L) 24.3(L)  Platelets 150.0 - 400.0 K/uL 228.0 131(L) -   Lab Results  Component Value Date   CREATININE 1.19 09/29/2018   BUN 16 09/29/2018   NA 140 09/29/2018  K 4.0 09/29/2018   CL 107 09/29/2018   CO2 24 09/29/2018   Lab Results  Component Value Date   CHOL 117 07/24/2018   HDL 64 07/24/2018   LDLCALC 39 07/24/2018    TRIG 72 07/24/2018   CHOLHDL 1.8 07/24/2018     ASSESSMENT / PLAN: Problem List Items Addressed This Visit    Left ventricular thrombus following MI ([place])    Thrombus noted on MRI that was not seen on echocardiogram.  This is probably because with existing anterior akinesis/scar, any collateralization to the anterior wall would be through the RCA-PDA.  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  Thankfully, he has been relatively stable, but I do think we need to switch him over to full anticoagulation.  We will start warfarin today 5 mg daily.  He will have an INR checked next week. We will convert from Brilinta to Plavix.      Relevant Medications   warfarin (COUMADIN) 5 MG tablet   Ischemic cardiomyopathy (Chronic)    Despite having occluded LAD with full-thickness scar in the anteroapical wall, EF seems to be relatively preserved about 45 to 50%.  Improved with revascularization.  MRI does show that there is pretty much full thickness anterior scar and would not benefit from revascularization. He is out of the window for ICD. No heart failure symptoms-NYHA class I      Relevant Medications   warfarin (COUMADIN) 5 MG tablet   Hyperlipidemia with target LDL less than 70 (Chronic)    Cholesterol levels look great.  He is on rosuvastatin and tolerating it well.      Relevant Medications   warfarin (COUMADIN) 5 MG tablet   Coronary artery disease involving native coronary artery of native heart without angina pectoris (Chronic)    PCI to an occluded RCA in the setting of an inferior STEMI as well as by staged RI PCI.  CTO of the LAD evaluated with cardiac MRI showing no evidence of true viability.  However EF seems of improved.  Plan: Continue statin.  With the need to anticoagulate for LV thrombus, will convert from Brilinta to Plavix. With him having orthostatic hypotension, we have no room to add beta-blocker or  ACE inhibitor/ARB.      Relevant Medications   warfarin (COUMADIN) 5 MG tablet   CAD S/P percutaneous coronary angioplasty (Chronic)    Although he has relatively new stents in the RCA and ramus, with him being on warfarin I would like to switch him from Brilinta to avoid increased risk of bleeding.  Thankfully, his most recent hospitalization for anemia was not related to bleed. His hemoglobin levels will be followed.      Relevant Medications   warfarin (COUMADIN) 5 MG tablet      I spent a total of /30 minutes with the patient and chart review. >  50% of the time was spent in direct patient consultation.   Current medicines are reviewed at length with the patient today.  (+/- concerns) ready to discuss warfarin The following changes have been made:  See below  Patient Instructions  Medication Instructions:   stop BRILINTA   START CLOPIDOGREL ( PLAVIX) 75 MG - ONE TABLET DAILY WARFARIN 5 MG  ONE TABLET DAILY AT 6 PM   [facility] PHARMACIST WILL CALL YOU TOMORROW TO GO OVER INSTRUCTION   If you need a refill on your cardiac medications before your next  appointment, please call your pharmacy.   Lab work: NOT NEEDED If you have labs (blood work) drawn today and your tests are completely normal, you will receive your results only by: Marland Kitchen MyChart Message (if you have MyChart) OR . A paper copy in the mail If you have any lab test that is abnormal or we need to change your treatment, we will call you to review the results.  Testing/Procedures:  No tneeded Follow-Up: At [facility], you and your health needs are our priority.  As part of our continuing mission to provide you with exceptional heart care, we have created designated Provider Care Teams.  These Care Teams include your primary Cardiologist (physician) and Advanced Practice Providers (APPs -  Physician Assistants and Nurse Practitioners) who all work together to provide you with the care you need, when you need  it. . You will need a follow up appointment in   SEPT ( KEEP APPT).  Please call our office 2 months in advance to schedule this appointment.  You may see Glenetta Hew, MD or one of the following Advanced Practice Providers on your designated Care Team:   . Rosaria Ferries, PA-C . Darryl Sims, DNP, ANP  Any Other Special Instructions Will Be Listed Below (If Applicable).    Warfarin tablets What is this medicine? WARFARIN (WAR far in) is an anticoagulant. It is used to treat or prevent clots in the veins, arteries, lungs, or heart. This medicine may be used for other purposes; ask your health care provider or pharmacist if you have questions. COMMON BRAND NAME(S): Coumadin, Jantoven What should I tell my health care provider before I take this medicine? They need to know if you have any of these conditions:  alcoholism  anemia  bleeding disorders  cancer  diabetes  heart disease  high blood pressure  history of bleeding in the gastrointestinal tract  history of stroke or other brain injury or disease  kidney or liver disease  protein C deficiency  protein S deficiency  psychosis or dementia  recent injury, recent or planned surgery or procedure  an unusual or allergic reaction to warfarin, other medicines, foods, dyes, or preservatives  pregnant or trying to get pregnant  breast-feeding How should I use this medicine? Take this medicine by mouth with a glass of water. Follow the directions on the prescription label. You can take this medicine with or without food. Take your medicine at the same time each day. Do not take it more often than directed. Do not stop taking except on your doctor's advice. Stopping this medicine may increase your risk of a blood clot. Be sure to refill your prescription before you run out of medicine. If your doctor or healthcare professional calls to change your dose, write down the dose and any other instructions. Always read the  dose and instructions back to him or her to make sure you understand them. Tell your doctor or healthcare professional what strength of tablets you have on hand. Ask how many tablets you should take to equal your new dose. Write the date on the new instructions and keep them near your medicine. If you are told to stop taking your medicine until your next blood test, call your doctor or healthcare professional if you do not hear anything within 24 hours of the test to find out your new dose or when to restart your prior dose. A special MedGuide will be given to you by the pharmacist with each prescription and refill. Be  sure to read this information carefully each time. Talk to your pediatrician regarding the use of this medicine in children. Special care may be needed. Overdosage: If you think you have taken too much of this medicine contact a poison control center or emergency room at once. NOTE: This medicine is only for you. Do not share this medicine with others. What if I miss a dose? It is important not to miss a dose. If you miss a dose, call your healthcare provider. Take the dose as soon as possible on the same day. If it is almost time for your next dose, take only that dose. Do not take double or extra doses to make up for a missed dose. What may interact with this medicine? Do not take this medicine with any of the following medications:  agents that prevent or dissolve blood clots  aspirin or other salicylates  danshen  dextrothyroxine  mifepristone  St. John's Wort  red yeast rice This medicine may also interact with the following medications:  acetaminophen  agents that lower cholesterol  alcohol  allopurinol  amiodarone  antibiotics or medicines for treating bacterial, fungal or viral infections  azathioprine  barbiturate medicines for inducing sleep or treating seizures  certain medicines for diabetes  certain medicines for heart rhythm problems  certain  medicines for hepatitis C virus infections like daclatasvir, dasabuvir; ombitasvir; paritaprevir; ritonavir, elbasvir; grazoprevir, ledipasvir; sofosbuvir, simeprevir, sofosbuvir, sofosbuvir; velpatasvir, sofosbuvir; velpatasvir; voxilaprevir  certain medicines for high blood pressure  chloral hydrate  cisapride  conivaptan  disulfiram  male hormones, including contraceptive or birth control pills  general anesthetics  herbal or dietary products like garlic, ginkgo, ginseng, green tea, or kava kava  influenza virus vaccine  male hormones  medicines for mental depression or psychosis  medicines for some types of cancer  medicines for stomach problems  methylphenidate  NSAIDs, medicines for pain and inflammation, like ibuprofen or naproxen  propoxyphene  quinidine, quinine  raloxifene  seizure or epilepsy medicine like carbamazepine, phenytoin, and valproic acid  steroids like cortisone and prednisone  tamoxifen  thyroid medicine  tramadol  vitamin c, vitamin e, and vitamin K  zafirlukast  zileuton This list may not describe all possible interactions. Give your health care provider a list of all the medicines, herbs, non-prescription drugs, or dietary supplements you use. Also tell them if you smoke, drink alcohol, or use illegal drugs. Some items may interact with your medicine. What should I watch for while using this medicine? Visit your healthcare professional for regular checks on your progress. You will need to have a blood test called a PT/INR regularly. The PT/INR blood test is done to make sure you are getting the right dose of this medicine. It is important to not miss your appointment for the blood tests. When you first start taking this medicine, these tests are done often. Once the correct dose is determined and you take your medicine properly, these tests can be done less often. Wear a medical ID bracelet or chain, and carry a card that describes  your disease and details of your medicine and dosage times. Do not start taking or stop taking any medicines or over-the-counter medicines except on the advice of your healthcare professional. You should discuss your diet with your healthcare professional. Do not make major changes in your diet. Vitamin K can affect how well this medicine works. Many foods contain vitamin K. It is important to eat a consistent amount of foods with vitamin K. Other foods  with vitamin K that you should eat in consistent amounts are asparagus, basil, black-eyed peas, broccoli, brussel sprouts, cabbage, green onions, green tea, parsley, green leafy vegetables like beet greens, collard greens, kale, spinach, turnip greens, or certain lettuces like green leaf or romaine. This medicine can cause birth defects or bleeding in an unborn child. Women of childbearing age should use effective birth control while taking this medicine. If a woman becomes pregnant while taking this medicine, she should discuss the potential risks and her options with her healthcare professional. Avoid sports and activities that might cause injury while you are using this medicine. Severe falls or injuries can cause unseen bleeding. Be careful when using sharp tools or knives. Consider using an Copy. Take special care brushing or flossing your teeth. Report any injuries, bruising, or red spots on the skin to your healthcare professional. If you have an illness that causes vomiting, diarrhea, or fever for more than a few days, contact your health care professional. Also, check with your healthcare professional if you are unable to eat for several days. These problems can change the effect of this medicine. Even after you stop taking this medicine, it takes several days before your body recovers its normal ability to clot blood. Ask your healthcare professional how long you need to be careful. If you are going to have surgery or dental work, tell your  health care professional that you have been taking this medicine. What side effects may I notice from receiving this medicine? Side effects that you should report to your doctor or health care professional as soon as possible:  allergic reactions like skin rash, itching or hives, swelling of the face, lips, or tongue  heavy menstrual bleeding or vaginal bleeding  painful, blue or purple toes  painful skin ulcers that do not go away  signs and symptoms of bleeding such as bloody or black, tarry stools; red or dark-brown urine; spitting up blood or brown material that looks like coffee grounds; red spots on the skin; unusual bruising or bleeding from the eye, gums, or nose  signs and symptoms of a blood clot such as chest pain; shortness of breath; pain, swelling, or warmth in the leg  signs and symptoms of a stroke such as changes in vision; confusion; trouble speaking or understanding; severe headaches; sudden numbness or weakness of the face, arm or leg; trouble walking; dizziness; loss of coordination  stomach pain  unusually weak or tired Side effects that usually do not require medical attention (report to your doctor or health care professional if they continue or are bothersome):  diarrhea  hair loss This list may not describe all possible side effects. Call your doctor for medical advice about side effects. You may report side effects to FDA at 1-800-FDA-1088. Where should I keep my medicine? Keep out of the reach of children. Store at room temperature between 15 and 30 degrees C (59 and 86 degrees F). Protect from light. Throw away any unused medicine after the expiration date. Do not flush down the toilet. NOTE: This sheet is a summary. It may not cover all possible information. If you have questions about this medicine, talk to your doctor, pharmacist, or health care provider.  2020 Elsevier/Gold Standard (2016-12-25 13:06:45)       Studies Ordered:   No orders of  the defined types were placed in this encounter.     Glenetta Hew, M.D., M.S. Interventional Cardiologist   Pager # [phone] Phone # [phone] [address]  [place], [place] [address]   Thank you for choosing [facility] at [facility]!!

## 2018-10-08 NOTE — Telephone Encounter (Signed)
LVM,remindingpt of his appt with Dr Elby Beck on 10-08-18.

## 2018-10-08 NOTE — Patient Instructions (Addendum)
Medication Instructions:   stop BRILINTA   START CLOPIDOGREL ( PLAVIX) 75 MG - ONE TABLET DAILY WARFARIN 5 MG  ONE TABLET DAILY AT 6 PM   [facility] PHARMACIST WILL CALL YOU TOMORROW TO GO OVER INSTRUCTION   If you need a refill on your cardiac medications before your next appointment, please call your pharmacy.   Lab work: NOT NEEDED If you have labs (blood work) drawn today and your tests are completely normal, you will receive your results only by: Marland Kitchen MyChart Message (if you have MyChart) OR . A paper copy in the mail If you have any lab test that is abnormal or we need to change your treatment, we will call you to review the results.  Testing/Procedures:  No tneeded Follow-Up: At [facility], you and your health needs are our priority.  As part of our continuing mission to provide you with exceptional heart care, we have created designated Provider Care Teams.  These Care Teams include your primary Cardiologist (physician) and Advanced Practice Providers (APPs -  Physician Assistants and Nurse Practitioners) who all work together to provide you with the care you need, when you need it. . You will need a follow up appointment in   SEPT ( KEEP APPT).  Please call our office 2 months in advance to schedule this appointment.  You may see Glenetta Hew, MD or one of the following Advanced Practice Providers on your designated Care Team:   . Rosaria Ferries, PA-C . [name], DNP, ANP  Any Other Special Instructions Will Be Listed Below (If Applicable).    Warfarin tablets What is this medicine? WARFARIN (WAR far in) is an anticoagulant. It is used to treat or prevent clots in the veins, arteries, lungs, or heart. This medicine may be used for other purposes; ask your health care provider or pharmacist if you have questions. COMMON BRAND NAME(S): Coumadin, Jantoven What should I tell my health care provider before I take this medicine? They need to know if you have any of  these conditions:  alcoholism  anemia  bleeding disorders  cancer  diabetes  heart disease  high blood pressure  history of bleeding in the gastrointestinal tract  history of stroke or other brain injury or disease  kidney or liver disease  protein C deficiency  protein S deficiency  psychosis or dementia  recent injury, recent or planned surgery or procedure  an unusual or allergic reaction to warfarin, other medicines, foods, dyes, or preservatives  pregnant or trying to get pregnant  breast-feeding How should I use this medicine? Take this medicine by mouth with a glass of water. Follow the directions on the prescription label. You can take this medicine with or without food. Take your medicine at the same time each day. Do not take it more often than directed. Do not stop taking except on your doctor's advice. Stopping this medicine may increase your risk of a blood clot. Be sure to refill your prescription before you run out of medicine. If your doctor or healthcare professional calls to change your dose, write down the dose and any other instructions. Always read the dose and instructions back to him or her to make sure you understand them. Tell your doctor or healthcare professional what strength of tablets you have on hand. Ask how many tablets you should take to equal your new dose. Write the date on the new instructions and keep them near your medicine. If you are told to stop taking your  medicine until your next blood test, call your doctor or healthcare professional if you do not hear anything within 24 hours of the test to find out your new dose or when to restart your prior dose. A special MedGuide will be given to you by the pharmacist with each prescription and refill. Be sure to read this information carefully each time. Talk to your pediatrician regarding the use of this medicine in children. Special care may be needed. Overdosage: If you think you have taken  too much of this medicine contact a poison control center or emergency room at once. NOTE: This medicine is only for you. Do not share this medicine with others. What if I miss a dose? It is important not to miss a dose. If you miss a dose, call your healthcare provider. Take the dose as soon as possible on the same day. If it is almost time for your next dose, take only that dose. Do not take double or extra doses to make up for a missed dose. What may interact with this medicine? Do not take this medicine with any of the following medications:  agents that prevent or dissolve blood clots  aspirin or other salicylates  danshen  dextrothyroxine  mifepristone  St. John's Wort  red yeast rice This medicine may also interact with the following medications:  acetaminophen  agents that lower cholesterol  alcohol  allopurinol  amiodarone  antibiotics or medicines for treating bacterial, fungal or viral infections  azathioprine  barbiturate medicines for inducing sleep or treating seizures  certain medicines for diabetes  certain medicines for heart rhythm problems  certain medicines for hepatitis C virus infections like daclatasvir, dasabuvir; ombitasvir; paritaprevir; ritonavir, elbasvir; grazoprevir, ledipasvir; sofosbuvir, simeprevir, sofosbuvir, sofosbuvir; velpatasvir, sofosbuvir; velpatasvir; voxilaprevir  certain medicines for high blood pressure  chloral hydrate  cisapride  conivaptan  disulfiram  male hormones, including contraceptive or birth control pills  general anesthetics  herbal or dietary products like garlic, ginkgo, ginseng, green tea, or kava kava  influenza virus vaccine  male hormones  medicines for mental depression or psychosis  medicines for some types of cancer  medicines for stomach problems  methylphenidate  NSAIDs, medicines for pain and inflammation, like ibuprofen or naproxen  propoxyphene  quinidine,  quinine  raloxifene  seizure or epilepsy medicine like carbamazepine, phenytoin, and valproic acid  steroids like cortisone and prednisone  tamoxifen  thyroid medicine  tramadol  vitamin c, vitamin e, and vitamin K  zafirlukast  zileuton This list may not describe all possible interactions. Give your health care provider a list of all the medicines, herbs, non-prescription drugs, or dietary supplements you use. Also tell them if you smoke, drink alcohol, or use illegal drugs. Some items may interact with your medicine. What should I watch for while using this medicine? Visit your healthcare professional for regular checks on your progress. You will need to have a blood test called a PT/INR regularly. The PT/INR blood test is done to make sure you are getting the right dose of this medicine. It is important to not miss your appointment for the blood tests. When you first start taking this medicine, these tests are done often. Once the correct dose is determined and you take your medicine properly, these tests can be done less often. Wear a medical ID bracelet or chain, and carry a card that describes your disease and details of your medicine and dosage times. Do not start taking or stop taking any medicines or over-the-counter medicines  except on the advice of your healthcare professional. You should discuss your diet with your healthcare professional. Do not make major changes in your diet. Vitamin K can affect how well this medicine works. Many foods contain vitamin K. It is important to eat a consistent amount of foods with vitamin K. Other foods with vitamin K that you should eat in consistent amounts are asparagus, basil, black-eyed peas, broccoli, brussel sprouts, cabbage, green onions, green tea, parsley, green leafy vegetables like beet greens, collard greens, kale, spinach, turnip greens, or certain lettuces like green leaf or romaine. This medicine can cause birth defects or  bleeding in an unborn child. Women of childbearing age should use effective birth control while taking this medicine. If a woman becomes pregnant while taking this medicine, she should discuss the potential risks and her options with her healthcare professional. Avoid sports and activities that might cause injury while you are using this medicine. Severe falls or injuries can cause unseen bleeding. Be careful when using sharp tools or knives. Consider using an Copy. Take special care brushing or flossing your teeth. Report any injuries, bruising, or red spots on the skin to your healthcare professional. If you have an illness that causes vomiting, diarrhea, or fever for more than a few days, contact your health care professional. Also, check with your healthcare professional if you are unable to eat for several days. These problems can change the effect of this medicine. Even after you stop taking this medicine, it takes several days before your body recovers its normal ability to clot blood. Ask your healthcare professional how long you need to be careful. If you are going to have surgery or dental work, tell your health care professional that you have been taking this medicine. What side effects may I notice from receiving this medicine? Side effects that you should report to your doctor or health care professional as soon as possible:  allergic reactions like skin rash, itching or hives, swelling of the face, lips, or tongue  heavy menstrual bleeding or vaginal bleeding  painful, blue or purple toes  painful skin ulcers that do not go away  signs and symptoms of bleeding such as bloody or black, tarry stools; red or dark-brown urine; spitting up blood or brown material that looks like coffee grounds; red spots on the skin; unusual bruising or bleeding from the eye, gums, or nose  signs and symptoms of a blood clot such as chest pain; shortness of breath; pain, swelling, or warmth in the  leg  signs and symptoms of a stroke such as changes in vision; confusion; trouble speaking or understanding; severe headaches; sudden numbness or weakness of the face, arm or leg; trouble walking; dizziness; loss of coordination  stomach pain  unusually weak or tired Side effects that usually do not require medical attention (report to your doctor or health care professional if they continue or are bothersome):  diarrhea  hair loss This list may not describe all possible side effects. Call your doctor for medical advice about side effects. You may report side effects to FDA at 1-800-FDA-1088. Where should I keep my medicine? Keep out of the reach of children. Store at room temperature between 15 and 30 degrees C (59 and 86 degrees F). Protect from light. Throw away any unused medicine after the expiration date. Do not flush down the toilet. NOTE: This sheet is a summary. It may not cover all possible information. If you have questions about this medicine, talk to  your doctor, pharmacist, or health care provider.  2020 Elsevier/Gold Standard (2016-12-25 13:06:45)

## 2018-10-10 ENCOUNTER — Encounter: Payer: Self-pay | Admitting: Cardiology

## 2018-10-10 DIAGNOSIS — I236 Thrombosis of atrium, auricular appendage, and ventricle as current complications following acute myocardial infarction: Secondary | ICD-10-CM | POA: Insufficient documentation

## 2018-10-10 HISTORY — DX: Thrombosis of atrium, auricular appendage, and ventricle as current complications following acute myocardial infarction: I23.6

## 2018-10-10 NOTE — Assessment & Plan Note (Signed)
PCI to an occluded RCA in the setting of an inferior STEMI as well as by staged RI PCI.  CTO of the LAD evaluated with cardiac MRI showing no evidence of true viability.  However EF seems of improved.  Plan: Continue statin.  With the need to anticoagulate for LV thrombus, will convert from Brilinta to Plavix. With him having orthostatic hypotension, we have no room to add beta-blocker or ACE inhibitor/ARB.

## 2018-10-10 NOTE — Assessment & Plan Note (Signed)
Cholesterol levels look great.  He is on rosuvastatin and tolerating it well.

## 2018-10-10 NOTE — Assessment & Plan Note (Signed)
Although he has relatively new stents in the RCA and ramus, with him being on warfarin I would like to switch him from Brilinta to avoid increased risk of bleeding.  Thankfully, his most recent hospitalization for anemia was not related to bleed. His hemoglobin levels will be followed.

## 2018-10-10 NOTE — Assessment & Plan Note (Signed)
Thrombus noted on MRI that was not seen on echocardiogram.  This is probably because with existing anterior akinesis/scar, any collateralization to the anterior wall would be through the RCA-PDA.  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  Thankfully, he has been relatively stable, but I do think we need to switch him over to full anticoagulation.  We will start warfarin today 5 mg daily.  He will have an INR checked next week. We will convert from Brilinta to Plavix.

## 2018-10-10 NOTE — Assessment & Plan Note (Signed)
Despite having occluded LAD with full-thickness scar in the anteroapical wall, EF seems to be relatively preserved about 45 to 50%.  Improved with revascularization.  MRI does show that there is pretty much full thickness anterior scar and would not benefit from revascularization. He is out of the window for ICD. No heart failure symptoms-NYHA class I

## 2018-10-12 ENCOUNTER — Ambulatory Visit (INDEPENDENT_AMBULATORY_CARE_PROVIDER_SITE_OTHER): Payer: Medicare Other | Admitting: Internal Medicine

## 2018-10-12 ENCOUNTER — Other Ambulatory Visit: Payer: Medicare Other

## 2018-10-12 ENCOUNTER — Other Ambulatory Visit: Payer: Self-pay

## 2018-10-12 ENCOUNTER — Ambulatory Visit (INDEPENDENT_AMBULATORY_CARE_PROVIDER_SITE_OTHER): Payer: Medicare Other | Admitting: Pharmacist

## 2018-10-12 DIAGNOSIS — I236 Thrombosis of atrium, auricular appendage, and ventricle as current complications following acute myocardial infarction: Secondary | ICD-10-CM

## 2018-10-12 DIAGNOSIS — Z23 Encounter for immunization: Secondary | ICD-10-CM

## 2018-10-12 DIAGNOSIS — I4891 Unspecified atrial fibrillation: Secondary | ICD-10-CM | POA: Insufficient documentation

## 2018-10-12 DIAGNOSIS — Z79899 Other long term (current) drug therapy: Secondary | ICD-10-CM

## 2018-10-12 DIAGNOSIS — Z7901 Long term (current) use of anticoagulants: Secondary | ICD-10-CM | POA: Insufficient documentation

## 2018-10-12 DIAGNOSIS — K5010 Crohn's disease of large intestine without complications: Secondary | ICD-10-CM

## 2018-10-12 LAB — POCT INR: INR: 1.2 — AB (ref 2.0–3.0)

## 2018-10-13 ENCOUNTER — Other Ambulatory Visit: Payer: Self-pay | Admitting: Internal Medicine

## 2018-10-14 ENCOUNTER — Telehealth (HOSPITAL_COMMUNITY): Payer: Self-pay

## 2018-10-14 LAB — QUANTIFERON-TB GOLD PLUS
Mitogen-NIL: 7.46 IU/mL
NIL: 0.03 IU/mL
QuantiFERON-TB Gold Plus: NEGATIVE
TB1-NIL: 0 IU/mL
TB2-NIL: 0.01 IU/mL

## 2018-10-16 ENCOUNTER — Other Ambulatory Visit: Payer: Self-pay

## 2018-10-16 ENCOUNTER — Telehealth: Payer: Self-pay

## 2018-10-16 NOTE — Telephone Encounter (Signed)
    COVID-19 Pre-Screening Questions:  . In the past 7 to 10 days have you had a cough,  shortness of breath, headache, congestion, fever (100 or greater) body aches, chills, sore throat, or sudden loss of taste or sense of smell? NO . Have you been around anyone with known Covid 19. NO . Have you been around anyone who is awaiting Covid 19 test results in the past 7 to 10 days? NO . Have you been around anyone who has been exposed to Covid 19, or has mentioned symptoms of Covid 19 within the past 7 to 10 days? NO  If you have any concerns/questions about symptoms patients report during screening (either on the phone or at threshold). Contact the provider seeing the patient or DOD for further guidance.  If neither are available contact a member of the leadership team.        Patient was advised of visitor restrictions (no visitors allowed except if needed to conduct the visit). Also advised to arrive at appointment time and wear a mask. Patient verbalized understanding and all (if any) questions were answered.

## 2018-10-19 ENCOUNTER — Ambulatory Visit (INDEPENDENT_AMBULATORY_CARE_PROVIDER_SITE_OTHER): Payer: Medicare Other | Admitting: *Deleted

## 2018-10-19 ENCOUNTER — Telehealth (HOSPITAL_COMMUNITY): Payer: Self-pay | Admitting: Pharmacist

## 2018-10-19 ENCOUNTER — Other Ambulatory Visit: Payer: Self-pay

## 2018-10-19 DIAGNOSIS — Z7901 Long term (current) use of anticoagulants: Secondary | ICD-10-CM

## 2018-10-19 DIAGNOSIS — I236 Thrombosis of atrium, auricular appendage, and ventricle as current complications following acute myocardial infarction: Secondary | ICD-10-CM | POA: Diagnosis not present

## 2018-10-19 LAB — POCT INR: INR: 2.8 (ref 2.0–3.0)

## 2018-10-19 NOTE — Patient Instructions (Signed)
Description   Continue taking 67m daily except for 7.557mon Monday and Wednesday. Repeat INR in 1 week. Call [place] any medication changes or concerns # [phone].

## 2018-10-20 ENCOUNTER — Telehealth (HOSPITAL_COMMUNITY): Payer: Self-pay | Admitting: *Deleted

## 2018-10-20 ENCOUNTER — Other Ambulatory Visit: Payer: Self-pay | Admitting: Internal Medicine

## 2018-10-20 NOTE — Telephone Encounter (Signed)
Cardiac Rehab Medication Review by a Pharmacist  Does the patient  feel that his/her medications are working for him/her?  yes  Has the patient been experiencing any side effects to the medications prescribed?  no  Does the patient measure his/her own blood pressure or blood glucose at home?  Yes  Average BP 110/60s  Does the patient have any problems obtaining medications due to transportation or finances?   no  Understanding of regimen: excellent Understanding of indications: excellent Potential of compliance: excellent   Lorel Monaco, PharmD PGY1 [place] Resident [facility] # [phone]

## 2018-10-20 NOTE — Telephone Encounter (Signed)
-----   Message from [name], MD sent at 10/19/2018  6:08 PM EDT ----- Regarding: RE: Ok to proceed with group exercise - high Covid Score I think he would be an excellent candidate.  Glenetta Hew, MD ----- Message ----- From: Rowe Pavy, RN Sent: 10/19/2018  11:25 AM EDT To: [name], MD, Noel Christmas, RN Subject: Melton Alar: Ok to proceed with group exercise - high#  Dr. Ellyn Hack  Pt is tentatively scheduled for orientation on October 22, 2018.  Have you had the opportunity to review his appropriateness for group exercise with strict adherence to Covid -19 Safety guidelines?  Thanks so much for your advisement Wendie Diskin ----- Message ----- From: Rowe Pavy, RN Sent: 10/12/2018  12:00 PM EDT To: [name], MD Subject: Madaline Brilliant to proceed with group exercise - high Cov#   Greetings Dr. Ellyn Hack,  We are now seeing patients on-site for cardiac rehab . A great deal of planning with advisement from our Medical Director - Dr. Radford Pax, CV Service line leadership, Infection Disease Control, Facilities, security, recommendations from American Association of Cardiac and Pulmonary Rehab (AACVPR) with the goal for optimal patient safety. Patients will have strict guidelines and criteria they must adhere to and follow. Patients will wear a mask during exercise and practice social distancing. Patients will have to complete screening prior to entry into gym area.   Your patient expressed great interest in participating in facility cardiac rehab. Patient has a COVID-19 risk score of 9.  Pt also has a thrombus noted on his MRI. Do you feel this patient is appropriate to resume exercise in facility cardiac rehab? Any additional restrictions you feel are appropriate for this patient?   Pt completed his follow up with you on 10/08/18  Thank you and we appreciate your input  Maurice Small RN, BSN Cardiac and Pulmonary Rehab Nurse Navigator   Cardiac Rehab Staff

## 2018-10-22 ENCOUNTER — Encounter (HOSPITAL_COMMUNITY)
Admission: RE | Admit: 2018-10-22 | Discharge: 2018-10-22 | Disposition: A | Discharge status: Home / Self Care | Payer: Medicare Other | Source: Ambulatory Visit | Attending: Cardiology | Admitting: Cardiology

## 2018-10-22 ENCOUNTER — Other Ambulatory Visit: Payer: Self-pay

## 2018-10-22 VITALS — Ht 65.75 in | Wt 177.0 lb

## 2018-10-22 DIAGNOSIS — I2111 ST elevation (STEMI) myocardial infarction involving right coronary artery: Secondary | ICD-10-CM

## 2018-10-22 DIAGNOSIS — Z955 Presence of coronary angioplasty implant and graft: Secondary | ICD-10-CM

## 2018-10-22 NOTE — Progress Notes (Signed)
Cardiac Individual Treatment Plan  Patient Details  Name: Darryl [name] MRN: [id number] Date of Birth: 23-Sep-1950 Referring Provider:     CARDIAC REHAB PHASE II ORIENTATION from 10/22/2018 in [place]  Referring Provider  [name] MD      Initial Encounter Date:    CARDIAC REHAB PHASE II ORIENTATION from 10/22/2018 in [place]  Date  10/22/18      Visit Diagnosis: ST elevation myocardial infarction involving right coronary artery [facility])  Status post coronary artery stent placement  Patient's Home Medications on Admission:  Current Outpatient Medications:  .  cholecalciferol (VITAMIN D3) 25 MCG (1000 UT) tablet, Take 1,000 Units by mouth daily., Disp: , Rfl:  .  clopidogrel (PLAVIX) 75 MG tablet, Take 1 tablet (75 mg total) by mouth daily., Disp: 90 tablet, Rfl: 3 .  fluticasone-salmeterol (ADVAIR HFA) 115-21 MCG/ACT inhaler, USE 2 PUFFS TWICE DAILY AS NEEDED. SHAKE WELL (Patient taking differently: Inhale 2 puffs into the lungs 2 (two) times daily. ), Disp: 12 g, Rfl: 1 .  folic acid (FOLVITE) 1 MG tablet, TAKE 1 TABLET ONCE DAILY. (Patient taking differently: Take 1 mg by mouth daily. ), Disp: 90 tablet, Rfl: 1 .  hydrocortisone 2.5 % cream, Apply 1 application topically daily. , Disp: , Rfl:  .  loperamide (IMODIUM A-D) 2 MG tablet, Take 2 mg by mouth 4 (four) times daily as needed for diarrhea or loose stools., Disp: , Rfl:  .  midodrine (PROAMATINE) 2.5 MG tablet, Take 2.5 mg every morning , may take an additional dose after 6 hr if still dizzy or for systloic BP less than 95 bpm with dizziness., Disp: 40 tablet, Rfl: 2 .  montelukast (SINGULAIR) 10 MG tablet, Take 1 tablet (10 mg total) by mouth at bedtime., Disp: 90 tablet, Rfl: 1 .  Multiple Vitamins-Minerals (CENTRUM SILVER PO), Take 1 tablet by mouth daily., Disp: , Rfl:  .  nitroGLYCERIN (NITROSTAT) 0.4 MG SL tablet, Place 1 tablet (0.4 mg total) under  the tongue every 5 (five) minutes x 3 doses as needed for chest pain., Disp: 25 tablet, Rfl: 3 .  Omega-3 Fatty Acids (DIALYVITE OMEGA-3 CONCENTRATE) 600 MG CAPS, Take 1 tablet by mouth 2 (two) times daily., Disp: , Rfl:  .  pantoprazole (PROTONIX) 40 MG tablet, Take 1 tablet (40 mg total) by mouth daily., Disp: 90 tablet, Rfl: 1 .  rosuvastatin (CRESTOR) 40 MG tablet, Take 1 tablet (40 mg total) by mouth daily., Disp: 90 tablet, Rfl: 3 .  telmisartan (MICARDIS) 40 MG tablet, TAKE 1 TABLET ONCE DAILY., Disp: 90 tablet, Rfl: 1 .  traMADol (ULTRAM) 50 MG tablet, TAKE ONE TABLET EVERY 6 HOURS AS NEEDED. (Patient taking differently: Take 50 mg by mouth every 6 (six) hours as needed (pain). ), Disp: 30 tablet, Rfl: 0 .  VENTOLIN HFA 108 (90 Base) MCG/ACT inhaler, USE 2 PUFFS EVERY 6 HOURS AS NEEDED FOR WHEEZING. **SHAKE WELL** (Patient taking differently: Inhale 2 puffs into the lungs every 6 (six) hours as needed for wheezing. ), Disp: 18 g, Rfl: 0 .  vitamin B-12 (CYANOCOBALAMIN) 1000 MCG tablet, Take 1 tablet (1,000 mcg total) by mouth daily., Disp: , Rfl:  .  warfarin (COUMADIN) 5 MG tablet, Take 1 tablet (5 mg total) by mouth daily at 6 PM., Disp: 30 tablet, Rfl: 3  Past Medical History: Past Medical History:  Diagnosis Date  . Acute upper respiratory infections of unspecified site   .  Allergic rhinitis, cause unspecified   . Anal stenosis   . Asthma   . Cataract   . Chronic orthostatic hypotension    Related to borderline hypotension; intolerant of blood pressure medications.  On Midodrine  . Coronary artery disease involving native coronary artery of native heart without angina pectoris     p-mRCA 100% (DES PCI - p-mRCA 100% (DES PCI - Resolute Onyx 3.5 x 26 --> ~3.7 mm)), dRCA ~50% & PAV 40%; pRI 85% (Staged DES PCI  Resolute Onyx 2.25 x 15 - ~2.5 mm). 100% LAD CTO after small D1 with ost-prox 60%. R-L collaterals to LAD seen post PCI.  Marland Kitchen Crohn's colitis ([place])   . Exostosis of unspecified  site   . GERD (gastroesophageal reflux disease)   . History of acute inferior wall MI 06/2018   100% p-mRCA -- DES PCI (complicated by mild cardiogenic shock, partially related to 100% LAD CTO) --> staged PCI-RI  . Hyperlipidemia with target LDL less than 70 07/04/2015   Now s/p MI with MV CAD - optimal LDL < 50  . Hyperplastic colon polyp 02/26/2008  . Hypertension   . Ischemic cardiomyopathy    Initial echo shows EF of 40 to 45% with anterior hypokinesis. Cardiac MRI shows EF of 49% with anterior anteroapical hypokinesis/akinesis and LV thrombus.  . Lateral epicondylitis  of elbow   . Left ventricular thrombus following MI ([place]) 10/10/2018   Cardiac MRI 09/28/2018: Mildly dilated LV.  EF estimated roughly 49%.  Hypokinesis of the mid anterior, anteroseptal and apical anterior wall.  There is also apical thrombus (partially layered) 22 x 17 x 11.  . Leukopenia   . Macrocytic anemia    Related to azathioprine  . Nontraumatic rupture of patellar tendon   . Obesity, unspecified   . Regional enteritis of unspecified site   . Seasonal allergies   . Sessile colonic polyp   . Vitamin B12 deficiency     Tobacco Use: Social History   Tobacco Use  Smoking Status Never Smoker  Smokeless Tobacco Never Used    Labs: Recent Review Flowsheet Data    Labs for ITP Cardiac and Pulmonary Rehab Latest Ref Rng & Units 07/23/2018 07/23/2018 07/23/2018 07/24/2018 10/01/2018   Cholestrol 0 - 200 mg/dL - - - 117 -   LDLCALC 0 - 99 mg/dL - - - 39 -   HDL >40 mg/dL - - - 64 -   Trlycerides <150 mg/dL - - - 72 -   Hemoglobin A1c 4.6 - 6.5 % - - - 6.5(H) 5.9   PHART 7.350 - 7.450 - 7.341(L) - - -   PCO2ART 32.0 - 48.0 mmHg - 32.4 - - -   HCO3 20.0 - 28.0 mmol/L 18.3(L) 17.5(L) 19.3(L) - -   TCO2 22 - 32 mmol/L 19(L) 18(L) 20(L) - -   ACIDBASEDEF 0.0 - 2.0 mmol/L 7.0(H) 7.0(H) 6.0(H) - -   O2SAT % 68.0 89.0 68.0 - -      Capillary Blood Glucose: Lab Results  Component Value Date   GLUCAP 139 (H)  07/23/2018   GLUCAP 134 (H) 07/23/2018     Exercise Target Goals: Exercise Program Goal: Individual exercise prescription set using results from initial 6 min walk test and THRR while considering  patient's activity barriers and safety.   Exercise Prescription Goal: Initial exercise prescription builds to 30-45 minutes a day of aerobic activity, 2-3 days per week.  Home exercise guidelines will be given to patient during program as part of exercise  prescription that the participant will acknowledge.  Activity Barriers & Risk Stratification: Activity Barriers & Cardiac Risk Stratification - 10/22/18 1516      Activity Barriers & Cardiac Risk Stratification   Activity Barriers  None    Cardiac Risk Stratification  High       6 Minute Walk: 6 Minute Walk    Row Name 10/22/18 1515         6 Minute Walk   Phase  Initial     Distance  1841 feet     Walk Time  6 minutes     # of Rest Breaks  0     MPH  3.44     METS  3.61     RPE  12     Perceived Dyspnea   0     VO2 Peak  12.63     Symptoms  No     Resting HR  75 bpm     Resting BP  104/62     Resting Oxygen Saturation   96 %     Exercise Oxygen Saturation  during 6 min walk  97 %     Max Ex. HR  101 bpm     Max Ex. BP  110/70     2 Minute Post BP  106/68        Oxygen Initial Assessment:   Oxygen Re-Evaluation:   Oxygen Discharge (Final Oxygen Re-Evaluation):   Initial Exercise Prescription: Initial Exercise Prescription - 10/22/18 1500      Date of Initial Exercise RX and Referring Provider   Date  10/22/18    Referring Provider  [name] MD    Expected Discharge Date  12/04/18      Treadmill   MPH  2.3    Grade  1    Minutes  15    METs  3.08      NuStep   Level  2    SPM  75    Minutes  15    METs  3      Prescription Details   Frequency (times per week)  3x    Duration  Progress to 30 minutes of continuous aerobic without signs/symptoms of physical distress      Intensity   THRR  40-80% of Max Heartrate  60-121    Ratings of Perceived Exertion  11-13    Perceived Dyspnea  0-4      Progression   Progression  Continue progressive overload as per policy without signs/symptoms or physical distress.      Resistance Training   Training Prescription  Yes    Weight  3lbs    Reps  10-15       Perform Capillary Blood Glucose checks as needed.  Exercise Prescription Changes:   Exercise Comments:   Exercise Goals and Review: Exercise Goals    Row Name 10/22/18 1517             Exercise Goals   Increase Physical Activity  Yes       Intervention  Provide advice, education, support and counseling about physical activity/exercise needs.;Develop an individualized exercise prescription for aerobic and resistive training based on initial evaluation findings, risk stratification, comorbidities and participant's personal goals.       Expected Outcomes  Short Term: Attend rehab on a regular basis to increase amount of physical activity.;Long Term: Add in home exercise to make exercise part of routine and to increase amount of physical activity.;Long Term: Exercising  regularly at least 3-5 days a week.       Increase Strength and Stamina  Yes       Intervention  Provide advice, education, support and counseling about physical activity/exercise needs.;Develop an individualized exercise prescription for aerobic and resistive training based on initial evaluation findings, risk stratification, comorbidities and participant's personal goals.       Expected Outcomes  Short Term: Increase workloads from initial exercise prescription for resistance, speed, and METs.;Short Term: Perform resistance training exercises routinely during rehab and add in resistance training at home;Long Term: Improve cardiorespiratory fitness, muscular endurance and strength as measured by increased METs and functional capacity (6MWT)       Able to understand and use rate of perceived exertion (RPE) scale   Yes       Intervention  Provide education and explanation on how to use RPE scale       Expected Outcomes  Short Term: Able to use RPE daily in rehab to express subjective intensity level;Long Term:  Able to use RPE to guide intensity level when exercising independently       Knowledge and understanding of Target Heart Rate Range (THRR)  Yes       Intervention  Provide education and explanation of THRR including how the numbers were predicted and where they are located for reference       Expected Outcomes  Short Term: Able to state/look up THRR;Long Term: Able to use THRR to govern intensity when exercising independently;Short Term: Able to use daily as guideline for intensity in rehab       Able to check pulse independently  Yes       Intervention  Provide education and demonstration on how to check pulse in carotid and radial arteries.;Review the importance of being able to check your own pulse for safety during independent exercise       Expected Outcomes  Short Term: Able to explain why pulse checking is important during independent exercise;Long Term: Able to check pulse independently and accurately       Understanding of Exercise Prescription  Yes       Intervention  Provide education, explanation, and written materials on patient's individual exercise prescription       Expected Outcomes  Short Term: Able to explain program exercise prescription;Long Term: Able to explain home exercise prescription to exercise independently          Exercise Goals Re-Evaluation :   Discharge Exercise Prescription (Final Exercise Prescription Changes):   Nutrition:  Target Goals: Understanding of nutrition guidelines, daily intake of sodium <1546m, cholesterol <2066m calories 30% from fat and 7% or less from saturated fats, daily to have 5 or more servings of fruits and vegetables.  Biometrics: Pre Biometrics - 10/22/18 1516      Pre Biometrics   Height  5' 5.75" (1.67 m)    Weight  80.3 kg     Waist Circumference  42 inches    Hip Circumference  37 inches    Waist to Hip Ratio  1.14 %    BMI (Calculated)  28.79    Triceps Skinfold  18 mm    % Body Fat  30 %    Grip Strength  26 kg    Flexibility  11 in    Single Leg Stand  2.37 seconds        Nutrition Therapy Plan and Nutrition Goals:   Nutrition Assessments:   Nutrition Goals Re-Evaluation:   Nutrition Goals Re-Evaluation:  Nutrition Goals Discharge (Final Nutrition Goals Re-Evaluation):   Psychosocial: Target Goals: Acknowledge presence or absence of significant depression and/or stress, maximize coping skills, provide positive support system. Participant is able to verbalize types and ability to use techniques and skills needed for reducing stress and depression.  Initial Review & Psychosocial Screening: Initial Psych Review & Screening - 10/22/18 1427      Initial Review   Current issues with  None Identified      Family Dynamics   Good Support System?  Yes   Leston has his wife and two children for support     Barriers   Psychosocial barriers to participate in program  There are no identifiable barriers or psychosocial needs.      Screening Interventions   Interventions  Encouraged to exercise       Quality of Life Scores: Quality of Life - 10/22/18 1514      Quality of Life   Select  Quality of Life      Quality of Life Scores   Health/Function Pre  26.87 %    Socioeconomic Pre  25.71 %    Psych/Spiritual Pre  28.93 %    Family Pre  27.6 %    GLOBAL Pre  27.61 %      Scores of 19 and below usually indicate a poorer quality of life in these areas.  A difference of  2-3 points is a clinically meaningful difference.  A difference of 2-3 points in the total score of the Quality of Life Index has been associated with significant improvement in overall quality of life, self-image, physical symptoms, and general health in studies assessing change in quality of life.  PHQ-9: Recent Review  Flowsheet Data    Depression screen [facility] 2/9 10/22/2018 10/02/2018 02/24/2018 02/23/2018 04/21/2017   Decreased Interest 0 0 0 0 0   Down, Depressed, Hopeless 0 0 0 0 0   PHQ - 2 Score 0 0 0 0 0     Interpretation of Total Score  Total Score Depression Severity:  1-4 = Minimal depression, 5-9 = Mild depression, 10-14 = Moderate depression, 15-19 = Moderately severe depression, 20-27 = Severe depression   Psychosocial Evaluation and Intervention:   Psychosocial Re-Evaluation:   Psychosocial Discharge (Final Psychosocial Re-Evaluation):   Vocational Rehabilitation: Provide vocational rehab assistance to qualifying candidates.   Vocational Rehab Evaluation & Intervention: Vocational Rehab - 10/22/18 1428      Initial Vocational Rehab Evaluation & Intervention   Assessment shows need for Vocational Rehabilitation  No       Education: Education Goals: Education classes will be provided on a weekly basis, covering required topics. Participant will state understanding/return demonstration of topics presented.  Learning Barriers/Preferences: Learning Barriers/Preferences - 10/22/18 1517      Learning Barriers/Preferences   Learning Barriers  Hearing    Learning Preferences  Skilled Demonstration;Verbal Instruction;Written Material       Education Topics: Count Your Pulse:  -Group instruction provided by verbal instruction, demonstration, patient participation and written materials to support subject.  Instructors address importance of being able to find your pulse and how to count your pulse when at home without a heart monitor.  Patients get hands on experience counting their pulse with staff help and individually.   Heart Attack, Angina, and Risk Factor Modification:  -Group instruction provided by verbal instruction, video, and written materials to support subject.  Instructors address signs and symptoms of angina and heart attacks.    Also discuss risk factors  for heart disease  and how to make changes to improve heart health risk factors.   Functional Fitness:  -Group instruction provided by verbal instruction, demonstration, patient participation, and written materials to support subject.  Instructors address safety measures for doing things around the house.  Discuss how to get up and down off the floor, how to pick things up properly, how to safely get out of a chair without assistance, and balance training.   Meditation and Mindfulness:  -Group instruction provided by verbal instruction, patient participation, and written materials to support subject.  Instructor addresses importance of mindfulness and meditation practice to help reduce stress and improve awareness.  Instructor also leads participants through a meditation exercise.    Stretching for Flexibility and Mobility:  -Group instruction provided by verbal instruction, patient participation, and written materials to support subject.  Instructors lead participants through series of stretches that are designed to increase flexibility thus improving mobility.  These stretches are additional exercise for major muscle groups that are typically performed during regular warm up and cool down.   Hands Only CPR:  -Group verbal, video, and participation provides a basic overview of AHA guidelines for community CPR. Role-play of emergencies allow participants the opportunity to practice calling for help and chest compression technique with discussion of AED use.   Hypertension: -Group verbal and written instruction that provides a basic overview of hypertension including the most recent diagnostic guidelines, risk factor reduction with self-care instructions and medication management.    Nutrition I class: Heart Healthy Eating:  -Group instruction provided by PowerPoint slides, verbal discussion, and written materials to support subject matter. The instructor gives an explanation and review of the Therapeutic  Lifestyle Changes diet recommendations, which includes a discussion on lipid goals, dietary fat, sodium, fiber, plant stanol/sterol esters, sugar, and the components of a well-balanced, healthy diet.   Nutrition II class: Lifestyle Skills:  -Group instruction provided by PowerPoint slides, verbal discussion, and written materials to support subject matter. The instructor gives an explanation and review of label reading, grocery shopping for heart health, heart healthy recipe modifications, and ways to make healthier choices when eating out.   Diabetes Question & Answer:  -Group instruction provided by PowerPoint slides, verbal discussion, and written materials to support subject matter. The instructor gives an explanation and review of diabetes co-morbidities, pre- and post-prandial blood glucose goals, pre-exercise blood glucose goals, signs, symptoms, and treatment of hypoglycemia and hyperglycemia, and foot care basics.   Diabetes Blitz:  -Group instruction provided by PowerPoint slides, verbal discussion, and written materials to support subject matter. The instructor gives an explanation and review of the physiology behind type 1 and type 2 diabetes, diabetes medications and rational behind using different medications, pre- and post-prandial blood glucose recommendations and Hemoglobin A1c goals, diabetes diet, and exercise including blood glucose guidelines for exercising safely.    Portion Distortion:  -Group instruction provided by PowerPoint slides, verbal discussion, written materials, and food models to support subject matter. The instructor gives an explanation of serving size versus portion size, changes in portions sizes over the last 20 years, and what consists of a serving from each food group.   Stress Management:  -Group instruction provided by verbal instruction, video, and written materials to support subject matter.  Instructors review role of stress in heart disease and how  to cope with stress positively.     Exercising on Your Own:  -Group instruction provided by verbal instruction, power point, and written materials to support subject.  Instructors discuss benefits of exercise, components of exercise, frequency and intensity of exercise, and end points for exercise.  Also discuss use of nitroglycerin and activating EMS.  Review options of places to exercise outside of rehab.  Review guidelines for sex with heart disease.   Cardiac Drugs I:  -Group instruction provided by verbal instruction and written materials to support subject.  Instructor reviews cardiac drug classes: antiplatelets, anticoagulants, beta blockers, and statins.  Instructor discusses reasons, side effects, and lifestyle considerations for each drug class.   Cardiac Drugs II:  -Group instruction provided by verbal instruction and written materials to support subject.  Instructor reviews cardiac drug classes: angiotensin converting enzyme inhibitors (ACE-I), angiotensin II receptor blockers (ARBs), nitrates, and calcium channel blockers.  Instructor discusses reasons, side effects, and lifestyle considerations for each drug class.   Anatomy and Physiology of the Circulatory System:  Group verbal and written instruction and models provide basic cardiac anatomy and physiology, with the coronary electrical and arterial systems. Review of: AMI, Angina, Valve disease, Heart Failure, Peripheral Artery Disease, Cardiac Arrhythmia, Pacemakers, and the ICD.   Other Education:  -Group or individual verbal, written, or video instructions that support the educational goals of the cardiac rehab program.   Holiday Eating Survival Tips:  -Group instruction provided by PowerPoint slides, verbal discussion, and written materials to support subject matter. The instructor gives patients tips, tricks, and techniques to help them not only survive but enjoy the holidays despite the onslaught of food that accompanies  the holidays.   Knowledge Questionnaire Score: Knowledge Questionnaire Score - 10/22/18 1517      Knowledge Questionnaire Score   Pre Score  22/24       Core Components/Risk Factors/Patient Goals at Admission: Personal Goals and Risk Factors at Admission - 10/22/18 1519      Core Components/Risk Factors/Patient Goals on Admission    Weight Management  Weight Loss    Diabetes  Yes    Intervention  Provide education about signs/symptoms and action to take for hypo/hyperglycemia.;Provide education about proper nutrition, including hydration, and aerobic/resistive exercise prescription along with prescribed medications to achieve blood glucose in normal ranges: Fasting glucose 65-99 mg/dL    Expected Outcomes  Long Term: Attainment of HbA1C < 7%.;Short Term: Participant verbalizes understanding of the signs/symptoms and immediate care of hyper/hypoglycemia, proper foot care and importance of medication, aerobic/resistive exercise and nutrition plan for blood glucose control.    Hypertension  Yes    Intervention  Provide education on lifestyle modifcations including regular physical activity/exercise, weight management, moderate sodium restriction and increased consumption of fresh fruit, vegetables, and low fat dairy, alcohol moderation, and smoking cessation.;Monitor prescription use compliance.    Expected Outcomes  Short Term: Continued assessment and intervention until BP is < 140/69m HG in hypertensive participants. < 130/874mHG in hypertensive participants with diabetes, heart failure or chronic kidney disease.;Long Term: Maintenance of blood pressure at goal levels.    Lipids  Yes    Intervention  Provide education and support for participant on nutrition & aerobic/resistive exercise along with prescribed medications to achieve LDL <7065mHDL >44m55m  Expected Outcomes  Short Term: Participant states understanding of desired cholesterol values and is compliant with medications  prescribed. Participant is following exercise prescription and nutrition guidelines.;Long Term: Cholesterol controlled with medications as prescribed, with individualized exercise RX and with personalized nutrition plan. Value goals: LDL < 70mg50mL > 40 mg.       Core Components/Risk Factors/Patient Goals Review:  Core Components/Risk Factors/Patient Goals at Discharge (Final Review):    ITP Comments: ITP Comments    Row Name 10/22/18 1421           ITP Comments  Dr. Fransico Him, Medical Director          Comments: Patient attended orientation on 10/22/2018 to review rules and guidelines for program.  Completed 6 minute walk test, Intitial ITP, and exercise prescription.  VSS. Telemetry-SR.  Asymptomatic. Safety measures and social distancing in place per CDC guidelines.

## 2018-10-26 ENCOUNTER — Encounter (HOSPITAL_COMMUNITY)
Admission: RE | Admit: 2018-10-26 | Discharge: 2018-10-26 | Disposition: A | Discharge status: Home / Self Care | Payer: Medicare Other | Source: Ambulatory Visit | Attending: Cardiology | Admitting: Cardiology

## 2018-10-26 ENCOUNTER — Other Ambulatory Visit: Payer: Self-pay

## 2018-10-26 DIAGNOSIS — I2111 ST elevation (STEMI) myocardial infarction involving right coronary artery: Secondary | ICD-10-CM | POA: Insufficient documentation

## 2018-10-26 DIAGNOSIS — Z955 Presence of coronary angioplasty implant and graft: Secondary | ICD-10-CM | POA: Diagnosis present

## 2018-10-26 NOTE — Progress Notes (Signed)
Daily Session Note  Patient Details  Name: Darryl [name] MRN: [id number] Date of Birth: 10/06/1950 Referring Provider:     CARDIAC REHAB PHASE II ORIENTATION from 10/22/2018 in [place]  Referring Provider  [name] MD      Encounter Date: 10/26/2018  Check In: Session Check In - 10/26/18 0848      Check-In   Supervising physician immediately available to respond to emergencies  [facility] immediately available    Physician(s)  Dr. [place]    Location  [facility]    Staff Present  Maurice Small, RN, Bjorn Loser, MS, Exercise Physiologist;Annedrea Rosezella [place], RN, MHA;Olinty Celesta Aver, MS, ACSM CEP, Exercise Physiologist;Demarquez Ciolek Karle Starch, RN, BSN    Virtual Visit  No    Medication changes reported      No    Fall or balance concerns reported     No    Tobacco Cessation  No Change    Warm-up and Cool-down  Performed on first and last piece of equipment    Resistance Training Performed  Yes    VAD Patient?  No    PAD/SET Patient?  No      Pain Assessment   Currently in Pain?  No/denies    Multiple Pain Sites  No       Capillary Blood Glucose: No results found for this or any previous visit (from the past 24 hour(s)).  Exercise Prescription Changes - 10/26/18 0847      Response to Exercise   Blood Pressure (Admit)  110/62    Blood Pressure (Exercise)  120/78    Blood Pressure (Exit)  110/74    Heart Rate (Admit)  70 bpm    Heart Rate (Exercise)  86 bpm    Heart Rate (Exit)  74 bpm    Rating of Perceived Exertion (Exercise)  11    Symptoms  none    Duration  Continue with 30 min of aerobic exercise without signs/symptoms of physical distress.    Intensity  THRR unchanged      Progression   Progression  Continue to progress workloads to maintain intensity without signs/symptoms of physical distress.    Average METs  2.6      Resistance Training   Training Prescription  Yes    Weight  3lbs    Reps  10-15    Time  10 Minutes      Interval Training   Interval Training  No      Treadmill   MPH  2.3    Grade  1    Minutes  15    METs  3.08      NuStep   Level  2    SPM  75    Minutes  15    METs  2.2       Social History   Tobacco Use  Smoking Status Never Smoker  Smokeless Tobacco Never Used    Goals Met:  Exercise tolerated well  Goals Unmet:  Not Applicable  Comments: Pt started cardiac rehab today.  Pt tolerated light exercise without difficulty. VSS, telemetry-SR with occasional PVC's, asymptomatic.  Medication list reconciled. Pt denies barriers to medicaiton compliance.  PSYCHOSOCIAL ASSESSMENT:  PHQ-0. Pt exhibits positive coping skills, hopeful outlook with supportive family. No psychosocial needs identified at this time, no psychosocial interventions necessary.  Pt oriented to exercise equipment and routine.    Understanding verbalized.    Dr. Fransico Him is Medical Director for  Cardiac Rehab at [facility].

## 2018-10-28 ENCOUNTER — Ambulatory Visit (INDEPENDENT_AMBULATORY_CARE_PROVIDER_SITE_OTHER): Payer: Medicare Other | Admitting: Pharmacist Clinician (PhC)/ Clinical Pharmacy Specialist

## 2018-10-28 ENCOUNTER — Other Ambulatory Visit (INDEPENDENT_AMBULATORY_CARE_PROVIDER_SITE_OTHER): Payer: Medicare Other

## 2018-10-28 ENCOUNTER — Encounter (HOSPITAL_COMMUNITY): Payer: Medicare Other

## 2018-10-28 ENCOUNTER — Telehealth: Payer: Self-pay

## 2018-10-28 ENCOUNTER — Other Ambulatory Visit: Payer: Self-pay

## 2018-10-28 DIAGNOSIS — K5010 Crohn's disease of large intestine without complications: Secondary | ICD-10-CM

## 2018-10-28 DIAGNOSIS — Z7901 Long term (current) use of anticoagulants: Secondary | ICD-10-CM | POA: Diagnosis not present

## 2018-10-28 DIAGNOSIS — I236 Thrombosis of atrium, auricular appendage, and ventricle as current complications following acute myocardial infarction: Secondary | ICD-10-CM

## 2018-10-28 LAB — CBC WITH DIFFERENTIAL/PLATELET
Basophils Absolute: 0 10*3/uL (ref 0.0–0.1)
Basophils Relative: 0.3 % (ref 0.0–3.0)
Eosinophils Absolute: 0 10*3/uL (ref 0.0–0.7)
Eosinophils Relative: 0.3 % (ref 0.0–5.0)
HCT: 36.1 % — ABNORMAL LOW (ref 39.0–52.0)
Hemoglobin: 12.3 g/dL — ABNORMAL LOW (ref 13.0–17.0)
Lymphocytes Relative: 17.7 % (ref 12.0–46.0)
Lymphs Abs: 1 10*3/uL (ref 0.7–4.0)
MCHC: 34.1 g/dL (ref 30.0–36.0)
MCV: 106.3 fl — ABNORMAL HIGH (ref 78.0–100.0)
Monocytes Absolute: 0.5 10*3/uL (ref 0.1–1.0)
Monocytes Relative: 8.4 % (ref 3.0–12.0)
Neutro Abs: 4.2 10*3/uL (ref 1.4–7.7)
Neutrophils Relative %: 73.3 % (ref 43.0–77.0)
Platelets: 181 10*3/uL (ref 150.0–400.0)
RBC: 3.4 Mil/uL — ABNORMAL LOW (ref 4.22–5.81)
RDW: 22.8 % — ABNORMAL HIGH (ref 11.5–15.5)
WBC: 5.8 10*3/uL (ref 4.0–10.5)

## 2018-10-28 LAB — POCT INR: INR: 3 (ref 2.0–3.0)

## 2018-10-28 NOTE — Telephone Encounter (Signed)
-----   Message from Algernon Huxley, RN sent at 10/16/2018  4:17 PM EDT ----- Regarding: CBC Pt needs CBC prior to starting CBC

## 2018-10-28 NOTE — Telephone Encounter (Signed)
Pt will come this afternoon for CBC prior to starting his humira tomorrow. Order already in epic.

## 2018-10-29 ENCOUNTER — Other Ambulatory Visit: Payer: Self-pay

## 2018-10-30 ENCOUNTER — Encounter (HOSPITAL_COMMUNITY)
Admission: RE | Admit: 2018-10-30 | Discharge: 2018-10-30 | Disposition: A | Discharge status: Home / Self Care | Payer: Medicare Other | Source: Ambulatory Visit | Attending: Cardiology | Admitting: Cardiology

## 2018-10-30 ENCOUNTER — Other Ambulatory Visit: Payer: Self-pay | Admitting: Pharmacist Clinician (PhC)/ Clinical Pharmacy Specialist

## 2018-10-30 DIAGNOSIS — Z955 Presence of coronary angioplasty implant and graft: Secondary | ICD-10-CM

## 2018-10-30 DIAGNOSIS — I2111 ST elevation (STEMI) myocardial infarction involving right coronary artery: Secondary | ICD-10-CM

## 2018-10-30 MED ORDER — WARFARIN SODIUM 5 MG PO TABS: 5.0000 mg | ORAL_TABLET | Freq: Every day | ORAL | 5 refills | Status: DC

## 2018-10-30 MED ORDER — WARFARIN SODIUM 5 MG PO TABS: ORAL_TABLET | ORAL | 1 refills | Status: DC

## 2018-11-02 ENCOUNTER — Other Ambulatory Visit: Payer: Self-pay

## 2018-11-02 ENCOUNTER — Encounter (HOSPITAL_COMMUNITY)
Admission: RE | Admit: 2018-11-02 | Discharge: 2018-11-02 | Disposition: A | Discharge status: Home / Self Care | Payer: Medicare Other | Source: Ambulatory Visit | Attending: Cardiology | Admitting: Cardiology

## 2018-11-02 DIAGNOSIS — I2111 ST elevation (STEMI) myocardial infarction involving right coronary artery: Secondary | ICD-10-CM | POA: Diagnosis not present

## 2018-11-02 DIAGNOSIS — Z955 Presence of coronary angioplasty implant and graft: Secondary | ICD-10-CM

## 2018-11-04 ENCOUNTER — Other Ambulatory Visit: Payer: Self-pay

## 2018-11-04 ENCOUNTER — Encounter (HOSPITAL_COMMUNITY)
Admission: RE | Admit: 2018-11-04 | Discharge: 2018-11-04 | Disposition: A | Discharge status: Home / Self Care | Payer: Medicare Other | Source: Ambulatory Visit | Attending: Cardiology | Admitting: Cardiology

## 2018-11-04 DIAGNOSIS — Z955 Presence of coronary angioplasty implant and graft: Secondary | ICD-10-CM

## 2018-11-04 DIAGNOSIS — I2111 ST elevation (STEMI) myocardial infarction involving right coronary artery: Secondary | ICD-10-CM | POA: Diagnosis not present

## 2018-11-06 ENCOUNTER — Other Ambulatory Visit: Payer: Self-pay

## 2018-11-06 ENCOUNTER — Encounter (HOSPITAL_COMMUNITY)
Admission: RE | Admit: 2018-11-06 | Discharge: 2018-11-06 | Disposition: A | Discharge status: Home / Self Care | Payer: Medicare Other | Source: Ambulatory Visit | Attending: Cardiology | Admitting: Cardiology

## 2018-11-06 DIAGNOSIS — I2111 ST elevation (STEMI) myocardial infarction involving right coronary artery: Secondary | ICD-10-CM | POA: Diagnosis not present

## 2018-11-06 DIAGNOSIS — Z955 Presence of coronary angioplasty implant and graft: Secondary | ICD-10-CM

## 2018-11-09 ENCOUNTER — Encounter (HOSPITAL_COMMUNITY)
Admission: RE | Admit: 2018-11-09 | Discharge: 2018-11-09 | Disposition: A | Discharge status: Home / Self Care | Payer: Medicare Other | Source: Ambulatory Visit | Attending: Cardiology | Admitting: Cardiology

## 2018-11-09 ENCOUNTER — Other Ambulatory Visit: Payer: Self-pay

## 2018-11-09 DIAGNOSIS — I2111 ST elevation (STEMI) myocardial infarction involving right coronary artery: Secondary | ICD-10-CM | POA: Diagnosis not present

## 2018-11-09 DIAGNOSIS — Z955 Presence of coronary angioplasty implant and graft: Secondary | ICD-10-CM

## 2018-11-09 NOTE — Progress Notes (Signed)
Reviewed home exercise guidelines with patient including endpoints, temperature precautions, target heart rate and rate of perceived exertion. Pt is walking 45-60 minutes daily and plans to add cycling as his mode of home exercise. Pt voices understanding of instructions given.  Sol Passer, MS, ACSM CEP

## 2018-11-11 ENCOUNTER — Ambulatory Visit (INDEPENDENT_AMBULATORY_CARE_PROVIDER_SITE_OTHER): Payer: Medicare Other | Admitting: Pharmacist

## 2018-11-11 ENCOUNTER — Encounter (HOSPITAL_COMMUNITY)
Admission: RE | Admit: 2018-11-11 | Discharge: 2018-11-11 | Disposition: A | Discharge status: Home / Self Care | Payer: Medicare Other | Source: Ambulatory Visit | Attending: Cardiology | Admitting: Cardiology

## 2018-11-11 ENCOUNTER — Other Ambulatory Visit: Payer: Self-pay

## 2018-11-11 DIAGNOSIS — I2111 ST elevation (STEMI) myocardial infarction involving right coronary artery: Secondary | ICD-10-CM | POA: Diagnosis not present

## 2018-11-11 DIAGNOSIS — Z955 Presence of coronary angioplasty implant and graft: Secondary | ICD-10-CM

## 2018-11-11 DIAGNOSIS — Z7901 Long term (current) use of anticoagulants: Secondary | ICD-10-CM | POA: Diagnosis not present

## 2018-11-11 DIAGNOSIS — I236 Thrombosis of atrium, auricular appendage, and ventricle as current complications following acute myocardial infarction: Secondary | ICD-10-CM | POA: Diagnosis not present

## 2018-11-11 LAB — POCT INR: INR: 3.7 — AB (ref 2.0–3.0)

## 2018-11-11 NOTE — Progress Notes (Signed)
Cardiac Individual Treatment Plan  Patient Details  Name: Darryl Darryl [name] MRN: [id number] Date of Birth: Sep 23, 1950 Referring Provider:     CARDIAC REHAB PHASE II ORIENTATION from 10/22/2018 in [place]  Referring Provider  [name] MD      Initial Encounter Date:    CARDIAC REHAB PHASE II ORIENTATION from 10/22/2018 in [place]  Date  10/22/18      Visit Diagnosis: Status post coronary artery stent placement  ST elevation myocardial infarction involving right coronary artery ([place])  Patient's Home Medications on Admission:  Current Outpatient Medications:  .  cholecalciferol (VITAMIN D3) 25 MCG (1000 UT) tablet, Take 1,000 Units by mouth daily., Disp: , Rfl:  .  clopidogrel (PLAVIX) 75 MG tablet, Take 1 tablet (75 mg total) by mouth daily., Disp: 90 tablet, Rfl: 3 .  fluticasone-salmeterol (ADVAIR HFA) 115-21 MCG/ACT inhaler, USE 2 PUFFS TWICE DAILY AS NEEDED. SHAKE WELL (Patient taking differently: Inhale 2 puffs into the lungs 2 (two) times daily. ), Disp: 12 g, Rfl: 1 .  folic acid (FOLVITE) 1 MG tablet, TAKE 1 TABLET ONCE DAILY. (Patient taking differently: Take 1 mg by mouth daily. ), Disp: 90 tablet, Rfl: 1 .  hydrocortisone 2.5 % cream, Apply 1 application topically daily. , Disp: , Rfl:  .  loperamide (IMODIUM A-D) 2 MG tablet, Take 2 mg by mouth 4 (four) times daily as needed for diarrhea or loose stools., Disp: , Rfl:  .  midodrine (PROAMATINE) 2.5 MG tablet, Take 2.5 mg every morning , may take an additional dose after 6 hr if still dizzy or for systloic BP less than 95 bpm with dizziness., Disp: 40 tablet, Rfl: 2 .  montelukast (SINGULAIR) 10 MG tablet, Take 1 tablet (10 mg total) by mouth at bedtime., Disp: 90 tablet, Rfl: 1 .  Multiple Vitamins-Minerals (CENTRUM SILVER PO), Take 1 tablet by mouth daily., Disp: , Rfl:  .  nitroGLYCERIN (NITROSTAT) 0.4 MG SL tablet, Darryl [name] 1 tablet (0.4 mg total) under  the tongue every 5 (five) minutes x 3 doses as needed for chest pain., Disp: 25 tablet, Rfl: 3 .  Omega-3 Fatty Acids (DIALYVITE OMEGA-3 CONCENTRATE) 600 MG CAPS, Take 1 tablet by mouth 2 (two) times daily., Disp: , Rfl:  .  pantoprazole (PROTONIX) 40 MG tablet, Take 1 tablet (40 mg total) by mouth daily., Disp: 90 tablet, Rfl: 1 .  rosuvastatin (CRESTOR) 40 MG tablet, Take 1 tablet (40 mg total) by mouth daily., Disp: 90 tablet, Rfl: 3 .  telmisartan (MICARDIS) 40 MG tablet, TAKE 1 TABLET ONCE DAILY., Disp: 90 tablet, Rfl: 1 .  traMADol (ULTRAM) 50 MG tablet, TAKE ONE TABLET EVERY 6 HOURS AS NEEDED. (Patient taking differently: Take 50 mg by mouth every 6 (six) hours as needed (pain). ), Disp: 30 tablet, Rfl: 0 .  VENTOLIN HFA 108 (90 Base) MCG/ACT inhaler, USE 2 PUFFS EVERY 6 HOURS AS NEEDED FOR WHEEZING. **SHAKE WELL** (Patient taking differently: Inhale 2 puffs into the lungs every 6 (six) hours as needed for wheezing. ), Disp: 18 g, Rfl: 0 .  vitamin B-12 (CYANOCOBALAMIN) 1000 MCG tablet, Take 1 tablet (1,000 mcg total) by mouth daily., Disp: , Rfl:  .  warfarin (COUMADIN) 5 MG tablet, Take 1 to 1.5 tablets by mouth daily as directed by coumadin clinic, Disp: 120 tablet, Rfl: 1  Past Medical History: Past Medical History:  Diagnosis Date  . Acute upper respiratory infections of unspecified site   .  Allergic rhinitis, cause unspecified   . Anal stenosis   . Asthma   . Cataract   . Chronic orthostatic hypotension    Related to borderline hypotension; intolerant of blood pressure medications.  On Midodrine  . Coronary artery disease involving native coronary artery of native heart without angina pectoris     p-mRCA 100% (DES PCI - p-mRCA 100% (DES PCI - Resolute Onyx 3.5 x 26 --> ~3.7 mm)), dRCA ~50% & PAV 40%; pRI 85% (Staged DES PCI  Resolute Onyx 2.25 x 15 - ~2.5 mm). 100% LAD CTO after small D1 with ost-prox 60%. R-L collaterals to LAD seen post PCI.  Marland Kitchen Crohn's colitis ([place])   .  Exostosis of unspecified site   . GERD (gastroesophageal reflux disease)   . History of acute inferior wall MI 06/2018   100% p-mRCA -- DES PCI (complicated by mild cardiogenic shock, partially related to 100% LAD CTO) --> staged PCI-RI  . Hyperlipidemia with target LDL less than 70 07/04/2015   Now s/p MI with MV CAD - optimal LDL < 50  . Hyperplastic colon polyp 02/26/2008  . Hypertension   . Ischemic cardiomyopathy    Initial echo shows EF of 40 to 45% with anterior hypokinesis. Cardiac MRI shows EF of 49% with anterior anteroapical hypokinesis/akinesis and LV thrombus.  . Lateral epicondylitis  of elbow   . Left ventricular thrombus following MI ([place]) 10/10/2018   Cardiac MRI 09/28/2018: Mildly dilated LV.  EF estimated roughly 49%.  Hypokinesis of the mid anterior, anteroseptal and apical anterior wall.  There is also apical thrombus (partially layered) 22 x 17 x 11.  . Leukopenia   . Macrocytic anemia    Related to azathioprine  . Nontraumatic rupture of patellar tendon   . Obesity, unspecified   . Regional enteritis of unspecified site   . Seasonal allergies   . Sessile colonic polyp   . Vitamin B12 deficiency     Tobacco Use: Social History   Tobacco Use  Smoking Status Never Smoker  Smokeless Tobacco Never Used    Labs: Recent Review Flowsheet Data    Labs for ITP Cardiac and Pulmonary Rehab Latest Ref Rng & Units 07/23/2018 07/23/2018 07/23/2018 07/24/2018 10/01/2018   Cholestrol 0 - 200 mg/dL - - - 117 -   LDLCALC 0 - 99 mg/dL - - - 39 -   HDL >40 mg/dL - - - 64 -   Trlycerides <150 mg/dL - - - 72 -   Hemoglobin A1c 4.6 - 6.5 % - - - 6.5(H) 5.9   PHART 7.350 - 7.450 - 7.341(L) - - -   PCO2ART 32.0 - 48.0 mmHg - 32.4 - - -   HCO3 20.0 - 28.0 mmol/L 18.3(L) 17.5(L) 19.3(L) - -   TCO2 22 - 32 mmol/L 19(L) 18(L) 20(L) - -   ACIDBASEDEF 0.0 - 2.0 mmol/L 7.0(H) 7.0(H) 6.0(H) - -   O2SAT % 68.0 89.0 68.0 - -      Capillary Blood Glucose: Lab Results  Component Value Date    GLUCAP 139 (H) 07/23/2018   GLUCAP 134 (H) 07/23/2018     Exercise Target Goals: Exercise Program Goal: Individual exercise prescription set using results from initial 6 min walk test and THRR while considering  patient's activity barriers and safety.   Exercise Prescription Goal: Initial exercise prescription builds to 30-45 minutes a day of aerobic activity, 2-3 days per week.  Home exercise guidelines will be given to patient during program as part of exercise  prescription that the participant will acknowledge.  Activity Barriers & Risk Stratification: Activity Barriers & Cardiac Risk Stratification - 10/22/18 1516      Activity Barriers & Cardiac Risk Stratification   Activity Barriers  None    Cardiac Risk Stratification  High       6 Minute Walk: 6 Minute Walk    Row Name 10/22/18 1515         6 Minute Walk   Phase  Initial     Distance  1841 feet     Walk Time  6 minutes     # of Rest Breaks  0     MPH  3.44     METS  3.61     RPE  12     Perceived Dyspnea   0     VO2 Peak  12.63     Symptoms  No     Resting HR  75 bpm     Resting BP  104/62     Resting Oxygen Saturation   96 %     Exercise Oxygen Saturation  during 6 min walk  97 %     Max Ex. HR  101 bpm     Max Ex. BP  110/70     2 Minute Post BP  106/68        Oxygen Initial Assessment:   Oxygen Re-Evaluation:   Oxygen Discharge (Final Oxygen Re-Evaluation):   Initial Exercise Prescription: Initial Exercise Prescription - 10/22/18 1500      Date of Initial Exercise RX and Referring Provider   Date  10/22/18    Referring Provider  [name] MD    Expected Discharge Date  12/04/18      Treadmill   MPH  2.3    Grade  1    Minutes  15    METs  3.08      NuStep   Level  2    SPM  75    Minutes  15    METs  3      Prescription Details   Frequency (times per week)  3x    Duration  Progress to 30 minutes of continuous aerobic without signs/symptoms of physical distress       Intensity   THRR 40-80% of Max Heartrate  60-121    Ratings of Perceived Exertion  11-13    Perceived Dyspnea  0-4      Progression   Progression  Continue progressive overload as per policy without signs/symptoms or physical distress.      Resistance Training   Training Prescription  Yes    Weight  3lbs    Reps  10-15       Perform Capillary Blood Glucose checks as needed.  Exercise Prescription Changes:  Exercise Prescription Changes    Row Name 10/26/18 0847 11/09/18 0855           Response to Exercise   Blood Pressure (Admit)  110/62  124/78      Blood Pressure (Exercise)  120/78  118/70      Blood Pressure (Exit)  110/74  132/72      Heart Rate (Admit)  70 bpm  73 bpm      Heart Rate (Exercise)  86 bpm  102 bpm      Heart Rate (Exit)  74 bpm  72 bpm      Rating of Perceived Exertion (Exercise)  11  12      Symptoms  none  none      Duration  Continue with 30 min of aerobic exercise without signs/symptoms of physical distress.  Continue with 30 min of aerobic exercise without signs/symptoms of physical distress.      Intensity  THRR unchanged  THRR unchanged        Progression   Progression  Continue to progress workloads to maintain intensity without signs/symptoms of physical distress.  Continue to progress workloads to maintain intensity without signs/symptoms of physical distress.      Average METs  2.6  4        Resistance Training   Training Prescription  Yes  Yes      Weight  3lbs  5lbs      Reps  10-15  10-15      Time  10 Minutes  10 Minutes        Interval Training   Interval Training  No  No        Treadmill   MPH  2.3  2.8      Grade  1  2      Minutes  15  15      METs  3.08  3.91        NuStep   Level  2  3      SPM  75  75      Minutes  15  15      METs  2.2  4        Home Exercise Plan   Plans to continue exercise at  -  Home (comment) Walking      Frequency  -  Add 4 additional days to program exercise sessions.      Initial Home  Exercises Provided  -  11/09/18         Exercise Comments:  Exercise Comments    Row Name 10/26/18 0900 11/09/18 0856         Exercise Comments  Patient off to a good start with exercise. Pt toelrated low-moderate intensity exercise without symptoms.  Reviewed home exercise guidelines and goals with patient.         Exercise Goals and Review:  Exercise Goals    Row Name 10/22/18 1517             Exercise Goals   Increase Physical Activity  Yes       Intervention  Provide advice, education, support and counseling about physical activity/exercise needs.;Develop an individualized exercise prescription for aerobic and resistive training based on initial evaluation findings, risk stratification, comorbidities and participant's personal goals.       Expected Outcomes  Short Term: Attend rehab on a regular basis to increase amount of physical activity.;Long Term: Add in home exercise to make exercise part of routine and to increase amount of physical activity.;Long Term: Exercising regularly at least 3-5 days a week.       Increase Strength and Stamina  Yes       Intervention  Provide advice, education, support and counseling about physical activity/exercise needs.;Develop an individualized exercise prescription for aerobic and resistive training based on initial evaluation findings, risk stratification, comorbidities and participant's personal goals.       Expected Outcomes  Short Term: Increase workloads from initial exercise prescription for resistance, speed, and METs.;Short Term: Perform resistance training exercises routinely during rehab and add in resistance training at home;Long Term: Improve cardiorespiratory fitness, muscular endurance and strength as measured by increased METs and functional capacity (6MWT)  Able to understand and use rate of perceived exertion (RPE) scale  Yes       Intervention  Provide education and explanation on how to use RPE scale       Expected  Outcomes  Short Term: Able to use RPE daily in rehab to express subjective intensity level;Long Term:  Able to use RPE to guide intensity level when exercising independently       Knowledge and understanding of Target Heart Rate Range (THRR)  Yes       Intervention  Provide education and explanation of THRR including how the numbers were predicted and where they are located for reference       Expected Outcomes  Short Term: Able to state/look up THRR;Long Term: Able to use THRR to govern intensity when exercising independently;Short Term: Able to use daily as guideline for intensity in rehab       Able to check pulse independently  Yes       Intervention  Provide education and demonstration on how to check pulse in carotid and radial arteries.;Review the importance of being able to check your own pulse for safety during independent exercise       Expected Outcomes  Short Term: Able to explain why pulse checking is important during independent exercise;Long Term: Able to check pulse independently and accurately       Understanding of Exercise Prescription  Yes       Intervention  Provide education, explanation, and written materials on patient's individual exercise prescription       Expected Outcomes  Short Term: Able to explain program exercise prescription;Long Term: Able to explain home exercise prescription to exercise independently          Exercise Goals Re-Evaluation : Exercise Goals Re-Evaluation    Row Name 10/26/18 0900 11/09/18 0856           Exercise Goal Re-Evaluation   Exercise Goals Review  Increase Physical Activity;Able to check pulse independently;Able to understand and use rate of perceived exertion (RPE) scale;Knowledge and understanding of Target Heart Rate Range (THRR)  Increase Physical Activity;Able to check pulse independently;Able to understand and use rate of perceived exertion (RPE) scale;Knowledge and understanding of Target Heart Rate Range (THRR);Increase Strength  and Stamina;Understanding of Exercise Prescription      Comments  Patient able to understand and use RPE scale appropriately. Reviewed THRR, and patient has heart rate monitor that he can check his pulse with. Patient is walking 45-60 minutes daily as his mode of home exericse in addition to exericse at cardiac rehab.  Reviewed home exercise guidelines including THRR, RPE scale, and endpoints for exercise. Pt is walking 45-60 minutes daily and using 5lb hand weights for his resistance training. Pt uses the weights QOD. Pt has a stationary bike that he plans to use more once it gets cooler because of it's location, and pt is getting an outdoor bike in the next few weeks that he's looking forward to using. Pt's short term goal is to lose 10lbs, and his long term goal is to be able to use the elliptical machine at his gym for at least 30 minutes.      Expected Outcomes  Increase workloads as tolerated to help achieve personal health and fitness goals.  Patient will continue daily exercise routine to help improve stamina and help achieve weight loss goal.         Discharge Exercise Prescription (Final Exercise Prescription Changes): Exercise Prescription Changes - 11/09/18 6045  Response to Exercise   Blood Pressure (Admit)  124/78    Blood Pressure (Exercise)  118/70    Blood Pressure (Exit)  132/72    Heart Rate (Admit)  73 bpm    Heart Rate (Exercise)  102 bpm    Heart Rate (Exit)  72 bpm    Rating of Perceived Exertion (Exercise)  12    Symptoms  none    Duration  Continue with 30 min of aerobic exercise without signs/symptoms of physical distress.    Intensity  THRR unchanged      Progression   Progression  Continue to progress workloads to maintain intensity without signs/symptoms of physical distress.    Average METs  4      Resistance Training   Training Prescription  Yes    Weight  5lbs    Reps  10-15    Time  10 Minutes      Interval Training   Interval Training  No       Treadmill   MPH  2.8    Grade  2    Minutes  15    METs  3.91      NuStep   Level  3    SPM  75    Minutes  15    METs  4      Home Exercise Plan   Plans to continue exercise at  Home (comment)   Walking   Frequency  Add 4 additional days to program exercise sessions.    Initial Home Exercises Provided  11/09/18       Nutrition:  Target Goals: Understanding of nutrition guidelines, daily intake of sodium <1538m, cholesterol <2041m calories 30% from fat and 7% or less from saturated fats, daily to have 5 or more servings of fruits and vegetables.  Biometrics: Pre Biometrics - 10/22/18 1516      Pre Biometrics   Height  5' 5.75" (1.67 m)    Weight  177 lb 0.5 oz (80.3 kg)    Waist Circumference  42 inches    Hip Circumference  37 inches    Waist to Hip Ratio  1.14 %    BMI (Calculated)  28.79    Triceps Skinfold  18 mm    % Body Fat  30 %    Grip Strength  26 kg    Flexibility  11 in    Single Leg Stand  2.37 seconds        Nutrition Therapy Plan and Nutrition Goals:   Nutrition Assessments:   Nutrition Goals Re-Evaluation:   Nutrition Goals Re-Evaluation:   Nutrition Goals Discharge (Final Nutrition Goals Re-Evaluation):   Psychosocial: Target Goals: Acknowledge presence or absence of significant depression and/or stress, maximize coping skills, provide positive support system. Participant is able to verbalize types and ability to use techniques and skills needed for reducing stress and depression.  Initial Review & Psychosocial Screening: Initial Psych Review & Screening - 10/22/18 1427      Initial Review   Current issues with  None Identified      Family Dynamics   Good Support System?  Yes   Darryl Darryl [name] his wife and two children for support     Barriers   Psychosocial barriers to participate in program  There are no identifiable barriers or psychosocial needs.      Screening Interventions   Interventions  Encouraged to exercise        Quality of Life Scores: Quality of Life - 10/22/18  1514      Quality of Life   Select  Quality of Life      Quality of Life Scores   Health/Function Pre  26.87 %    Socioeconomic Pre  25.71 %    Psych/Spiritual Pre  28.93 %    Family Pre  27.6 %    GLOBAL Pre  27.61 %      Scores of 19 and below usually indicate a poorer quality of life in these areas.  A difference of  2-3 points is a clinically meaningful difference.  A difference of 2-3 points in the total score of the Quality of Life Index has been associated with significant improvement in overall quality of life, self-image, physical symptoms, and general health in studies assessing change in quality of life.  PHQ-9: Recent Review Flowsheet Data    Depression screen [facility] 2/9 10/22/2018 10/02/2018 02/24/2018 02/23/2018 04/21/2017   Decreased Interest 0 0 0 0 0   Down, Depressed, Hopeless 0 0 0 0 0   PHQ - 2 Score 0 0 0 0 0     Interpretation of Total Score  Total Score Depression Severity:  1-4 = Minimal depression, 5-9 = Mild depression, 10-14 = Moderate depression, 15-19 = Moderately severe depression, 20-27 = Severe depression   Psychosocial Evaluation and Intervention: Psychosocial Evaluation - 10/26/18 0853      Psychosocial Evaluation & Interventions   Interventions  Encouraged to exercise with the program and follow exercise prescription    Comments  No psychosocial interventions necessary. Darryl Darryl [name] enjoys reading, coin collecting, and stocks.    Expected Outcomes  Darryl Darryl [name] will maintain a positive outlook with good coping skills.    Continue Psychosocial Services   No Follow up required       Psychosocial Re-Evaluation: Psychosocial Re-Evaluation    Darryl Darryl [name] Name 11/11/18 1026             Psychosocial Re-Evaluation   Current issues with  None Identified       Interventions  Encouraged to attend Cardiac Rehabilitation for the exercise       Continue Psychosocial Services   No Follow up required           Psychosocial Discharge (Final Psychosocial Re-Evaluation): Psychosocial Re-Evaluation - 11/11/18 1026      Psychosocial Re-Evaluation   Current issues with  None Identified    Interventions  Encouraged to attend Cardiac Rehabilitation for the exercise    Continue Psychosocial Services   No Follow up required       Vocational Rehabilitation: Provide vocational rehab assistance to qualifying candidates.   Vocational Rehab Evaluation & Intervention: Vocational Rehab - 10/22/18 1428      Initial Vocational Rehab Evaluation & Intervention   Assessment shows need for Vocational Rehabilitation  No       Education: Education Goals: Education classes will be provided on a weekly basis, covering required topics. Participant will state understanding/return demonstration of topics presented.  Learning Barriers/Preferences: Learning Barriers/Preferences - 10/22/18 1517      Learning Barriers/Preferences   Learning Barriers  Hearing    Learning Preferences  Skilled Demonstration;Verbal Instruction;Written Material       Education Topics: Count Your Pulse:  -Group instruction provided by verbal instruction, demonstration, patient participation and written materials to support subject.  Instructors address importance of being able to find your pulse and how to count your pulse when at home without a heart monitor.  Patients get hands on experience counting their pulse with staff help and  individually.   Heart Attack, Angina, and Risk Factor Modification:  -Group instruction provided by verbal instruction, video, and written materials to support subject.  Instructors address signs and symptoms of angina and heart attacks.    Also discuss risk factors for heart disease and how to make changes to improve heart health risk factors.   Functional Fitness:  -Group instruction provided by verbal instruction, demonstration, patient participation, and written materials to support subject.   Instructors address safety measures for doing things around the house.  Discuss how to get up and down off the floor, how to pick things up properly, how to safely get out of a chair without assistance, and balance training.   Meditation and Mindfulness:  -Group instruction provided by verbal instruction, patient participation, and written materials to support subject.  Instructor addresses importance of mindfulness and meditation practice to help reduce stress and improve awareness.  Instructor also leads participants through a meditation exercise.    Stretching for Flexibility and Mobility:  -Group instruction provided by verbal instruction, patient participation, and written materials to support subject.  Instructors lead participants through series of stretches that are designed to increase flexibility thus improving mobility.  These stretches are additional exercise for major muscle groups that are typically performed during regular warm up and cool down.   Hands Only CPR:  -Group verbal, video, and participation provides a basic overview of AHA guidelines for community CPR. Role-play of emergencies allow participants the opportunity to practice calling for help and chest compression technique with discussion of AED use.   Hypertension: -Group verbal and written instruction that provides a basic overview of hypertension including the most recent diagnostic guidelines, risk factor reduction with self-care instructions and medication management.    Nutrition I class: Heart Healthy Eating:  -Group instruction provided by PowerPoint slides, verbal discussion, and written materials to support subject matter. The instructor gives an explanation and review of the Therapeutic Lifestyle Changes diet recommendations, which includes a discussion on lipid goals, dietary fat, sodium, fiber, plant stanol/sterol esters, sugar, and the components of a well-balanced, healthy diet.   Nutrition II class:  Lifestyle Skills:  -Group instruction provided by PowerPoint slides, verbal discussion, and written materials to support subject matter. The instructor gives an explanation and review of label reading, grocery shopping for heart health, heart healthy recipe modifications, and ways to make healthier choices when eating out.   Diabetes Question & Answer:  -Group instruction provided by PowerPoint slides, verbal discussion, and written materials to support subject matter. The instructor gives an explanation and review of diabetes co-morbidities, pre- and post-prandial blood glucose goals, pre-exercise blood glucose goals, signs, symptoms, and treatment of hypoglycemia and hyperglycemia, and foot care basics.   Diabetes Blitz:  -Group instruction provided by PowerPoint slides, verbal discussion, and written materials to support subject matter. The instructor gives an explanation and review of the physiology behind type 1 and type 2 diabetes, diabetes medications and rational behind using different medications, pre- and post-prandial blood glucose recommendations and Hemoglobin A1c goals, diabetes diet, and exercise including blood glucose guidelines for exercising safely.    Portion Distortion:  -Group instruction provided by PowerPoint slides, verbal discussion, written materials, and food models to support subject matter. The instructor gives an explanation of serving size versus portion size, changes in portions sizes over the last 20 years, and what consists of a serving from each food group.   Stress Management:  -Group instruction provided by verbal instruction, video, and written materials to support  subject matter.  Instructors review role of stress in heart disease and how to cope with stress positively.     Exercising on Your Own:  -Group instruction provided by verbal instruction, power point, and written materials to support subject.  Instructors discuss benefits of exercise, components  of exercise, frequency and intensity of exercise, and end points for exercise.  Also discuss use of nitroglycerin and activating EMS.  Review options of places to exercise outside of rehab.  Review guidelines for sex with heart disease.   Cardiac Drugs I:  -Group instruction provided by verbal instruction and written materials to support subject.  Instructor reviews cardiac drug classes: antiplatelets, anticoagulants, beta blockers, and statins.  Instructor discusses reasons, side effects, and lifestyle considerations for each drug class.   Cardiac Drugs II:  -Group instruction provided by verbal instruction and written materials to support subject.  Instructor reviews cardiac drug classes: angiotensin converting enzyme inhibitors (ACE-I), angiotensin II receptor blockers (ARBs), nitrates, and calcium channel blockers.  Instructor discusses reasons, side effects, and lifestyle considerations for each drug class.   Anatomy and Physiology of the Circulatory System:  Group verbal and written instruction and models provide basic cardiac anatomy and physiology, with the coronary electrical and arterial systems. Review of: AMI, Angina, Valve disease, Heart Failure, Peripheral Artery Disease, Cardiac Arrhythmia, Pacemakers, and the ICD.   Other Education:  -Group or individual verbal, written, or video instructions that support the educational goals of the cardiac rehab program.   Holiday Eating Survival Tips:  -Group instruction provided by PowerPoint slides, verbal discussion, and written materials to support subject matter. The instructor gives patients tips, tricks, and techniques to help them not only survive but enjoy the holidays despite the onslaught of food that accompanies the holidays.   Knowledge Questionnaire Score: Knowledge Questionnaire Score - 10/22/18 1517      Knowledge Questionnaire Score   Pre Score  22/24       Core Components/Risk Factors/Patient Goals at  Admission: Personal Goals and Risk Factors at Admission - 10/22/18 1519      Core Components/Risk Factors/Patient Goals on Admission    Weight Management  Weight Loss    Diabetes  Yes    Intervention  Provide education about signs/symptoms and action to take for hypo/hyperglycemia.;Provide education about proper nutrition, including hydration, and aerobic/resistive exercise prescription along with prescribed medications to achieve blood glucose in normal ranges: Fasting glucose 65-99 mg/dL    Expected Outcomes  Long Term: Attainment of HbA1C < 7%.;Short Term: Participant verbalizes understanding of the signs/symptoms and immediate care of hyper/hypoglycemia, proper foot care and importance of medication, aerobic/resistive exercise and nutrition plan for blood glucose control.    Hypertension  Yes    Intervention  Provide education on lifestyle modifcations including regular physical activity/exercise, weight management, moderate sodium restriction and increased consumption of fresh fruit, vegetables, and low fat dairy, alcohol moderation, and smoking cessation.;Monitor prescription use compliance.    Expected Outcomes  Short Term: Continued assessment and intervention until BP is < 140/68m HG in hypertensive participants. < 130/862mHG in hypertensive participants with diabetes, heart failure or chronic kidney disease.;Long Term: Maintenance of blood pressure at goal levels.    Lipids  Yes    Intervention  Provide education and support for participant on nutrition & aerobic/resistive exercise along with prescribed medications to achieve LDL <7019mHDL >5m36m  Expected Outcomes  Short Term: Participant states understanding of desired cholesterol values and is compliant with medications prescribed. Participant is following exercise  prescription and nutrition guidelines.;Long Term: Cholesterol controlled with medications as prescribed, with individualized exercise RX and with personalized nutrition  plan. Value goals: LDL < 93m, HDL > 40 mg.       Core Components/Risk Factors/Patient Goals Review:  Goals and Risk Factor Review    Row Name 10/26/18 0854 11/11/18 1315           Core Components/Risk Factors/Patient Goals Review   Personal Goals Review  Weight Management/Obesity;Hypertension;Lipids;Diabetes  Weight Management/Obesity;Hypertension;Lipids;Diabetes      Review  Pt with multiple CAD RFs willing to participate in CR exercise.  LMackenzywould like to get back to using the ellipitical.  Pt with multiple CAD RFs willing to participate in CR exercise.  LWorleyhas been doing well with exercise. Darryl Darryl [name] vital signs weights have been stable      Expected Outcomes  Pt will continue to participate in CR exercise, nutrition, and lifestyle modification opportunities.  Pt will continue to participate in CR exercise, nutrition, and lifestyle modification opportunities.         Core Components/Risk Factors/Patient Goals at Discharge (Final Review):  Goals and Risk Factor Review - 11/11/18 1315      Core Components/Risk Factors/Patient Goals Review   Personal Goals Review  Weight Management/Obesity;Hypertension;Lipids;Diabetes    Review  Pt with multiple CAD RFs willing to participate in CR exercise.  Darryl Darryl [name] been doing well with exercise. Darryl Darryl [name] vital signs weights have been stable    Expected Outcomes  Pt will continue to participate in CR exercise, nutrition, and lifestyle modification opportunities.       ITP Comments: ITP Comments    Row Name 10/22/18 1421 10/26/18 1641 11/11/18 1631       ITP Comments  Darryl Darryl [name] Medical Director  Pt started exercise today and tolerated it well.  30 Day ITP comments. Patient with good participation and attendance in phase 2 cardiac rehab. LKekaiis off to a good start to exercise.        Comments: 30 day ITP Review. See ITP comments.MBarnet Pall RN,BSN 11/12/2018 11:24 AM

## 2018-11-13 ENCOUNTER — Ambulatory Visit (INDEPENDENT_AMBULATORY_CARE_PROVIDER_SITE_OTHER): Payer: Medicare Other | Admitting: Internal Medicine

## 2018-11-13 ENCOUNTER — Other Ambulatory Visit: Payer: Self-pay

## 2018-11-13 ENCOUNTER — Encounter (HOSPITAL_COMMUNITY)
Admission: RE | Admit: 2018-11-13 | Discharge: 2018-11-13 | Disposition: A | Discharge status: Home / Self Care | Payer: Medicare Other | Source: Ambulatory Visit | Attending: Cardiology | Admitting: Cardiology

## 2018-11-13 DIAGNOSIS — I2111 ST elevation (STEMI) myocardial infarction involving right coronary artery: Secondary | ICD-10-CM | POA: Diagnosis not present

## 2018-11-13 DIAGNOSIS — Z23 Encounter for immunization: Secondary | ICD-10-CM

## 2018-11-13 DIAGNOSIS — Z955 Presence of coronary angioplasty implant and graft: Secondary | ICD-10-CM

## 2018-11-16 ENCOUNTER — Other Ambulatory Visit: Payer: Self-pay

## 2018-11-16 ENCOUNTER — Encounter (HOSPITAL_COMMUNITY)
Admission: RE | Admit: 2018-11-16 | Discharge: 2018-11-16 | Disposition: A | Discharge status: Home / Self Care | Payer: Medicare Other | Source: Ambulatory Visit | Attending: Cardiology | Admitting: Cardiology

## 2018-11-16 DIAGNOSIS — I2111 ST elevation (STEMI) myocardial infarction involving right coronary artery: Secondary | ICD-10-CM | POA: Diagnosis not present

## 2018-11-16 DIAGNOSIS — Z955 Presence of coronary angioplasty implant and graft: Secondary | ICD-10-CM

## 2018-11-18 ENCOUNTER — Encounter (HOSPITAL_COMMUNITY)
Admission: RE | Admit: 2018-11-18 | Discharge: 2018-11-18 | Disposition: A | Discharge status: Home / Self Care | Payer: Medicare Other | Source: Ambulatory Visit | Attending: Cardiology | Admitting: Cardiology

## 2018-11-18 ENCOUNTER — Other Ambulatory Visit: Payer: Self-pay

## 2018-11-18 DIAGNOSIS — Z955 Presence of coronary angioplasty implant and graft: Secondary | ICD-10-CM

## 2018-11-18 DIAGNOSIS — I2111 ST elevation (STEMI) myocardial infarction involving right coronary artery: Secondary | ICD-10-CM | POA: Diagnosis not present

## 2018-11-20 ENCOUNTER — Encounter (HOSPITAL_COMMUNITY): Payer: Medicare Other

## 2018-11-23 ENCOUNTER — Other Ambulatory Visit: Payer: Self-pay

## 2018-11-23 ENCOUNTER — Encounter (HOSPITAL_COMMUNITY)
Admission: RE | Admit: 2018-11-23 | Discharge: 2018-11-23 | Disposition: A | Discharge status: Home / Self Care | Payer: Medicare Other | Source: Ambulatory Visit | Attending: Cardiology | Admitting: Cardiology

## 2018-11-23 DIAGNOSIS — Z955 Presence of coronary angioplasty implant and graft: Secondary | ICD-10-CM

## 2018-11-23 DIAGNOSIS — I2111 ST elevation (STEMI) myocardial infarction involving right coronary artery: Secondary | ICD-10-CM

## 2018-11-25 ENCOUNTER — Encounter (HOSPITAL_COMMUNITY)
Admission: RE | Admit: 2018-11-25 | Discharge: 2018-11-25 | Disposition: A | Discharge status: Home / Self Care | Payer: Medicare Other | Source: Ambulatory Visit | Attending: Cardiology | Admitting: Cardiology

## 2018-11-25 ENCOUNTER — Other Ambulatory Visit: Payer: Self-pay

## 2018-11-25 DIAGNOSIS — I2111 ST elevation (STEMI) myocardial infarction involving right coronary artery: Secondary | ICD-10-CM

## 2018-11-25 DIAGNOSIS — Z955 Presence of coronary angioplasty implant and graft: Secondary | ICD-10-CM | POA: Diagnosis present

## 2018-11-26 ENCOUNTER — Ambulatory Visit (INDEPENDENT_AMBULATORY_CARE_PROVIDER_SITE_OTHER): Payer: Medicare Other | Admitting: Pharmacist Clinician (PhC)/ Clinical Pharmacy Specialist

## 2018-11-26 DIAGNOSIS — Z7901 Long term (current) use of anticoagulants: Secondary | ICD-10-CM | POA: Diagnosis not present

## 2018-11-26 DIAGNOSIS — I4891 Unspecified atrial fibrillation: Secondary | ICD-10-CM

## 2018-11-26 DIAGNOSIS — I236 Thrombosis of atrium, auricular appendage, and ventricle as current complications following acute myocardial infarction: Secondary | ICD-10-CM

## 2018-11-26 LAB — POCT INR: INR: 5.7 — AB (ref 2.0–3.0)

## 2018-11-27 ENCOUNTER — Other Ambulatory Visit: Payer: Self-pay

## 2018-11-27 ENCOUNTER — Encounter (HOSPITAL_COMMUNITY)
Admission: RE | Admit: 2018-11-27 | Discharge: 2018-11-27 | Disposition: A | Discharge status: Home / Self Care | Payer: Medicare Other | Source: Ambulatory Visit | Attending: Cardiology | Admitting: Cardiology

## 2018-11-27 ENCOUNTER — Telehealth: Payer: Self-pay | Admitting: *Deleted

## 2018-11-27 VITALS — Ht 65.75 in | Wt 176.4 lb

## 2018-11-27 DIAGNOSIS — I2111 ST elevation (STEMI) myocardial infarction involving right coronary artery: Secondary | ICD-10-CM | POA: Diagnosis not present

## 2018-11-27 DIAGNOSIS — Z955 Presence of coronary angioplasty implant and graft: Secondary | ICD-10-CM

## 2018-11-27 NOTE — Telephone Encounter (Signed)
Spoke to patient to switch  appt to  Virtual  - preferred in office - changed 12/22/18 at 1:40 am Patient aware

## 2018-12-02 ENCOUNTER — Encounter (HOSPITAL_COMMUNITY)
Admission: RE | Admit: 2018-12-02 | Discharge: 2018-12-02 | Disposition: A | Discharge status: Home / Self Care | Payer: Medicare Other | Source: Ambulatory Visit | Attending: Cardiology | Admitting: Cardiology

## 2018-12-02 ENCOUNTER — Other Ambulatory Visit: Payer: Self-pay

## 2018-12-02 DIAGNOSIS — I2111 ST elevation (STEMI) myocardial infarction involving right coronary artery: Secondary | ICD-10-CM | POA: Diagnosis not present

## 2018-12-02 DIAGNOSIS — Z955 Presence of coronary angioplasty implant and graft: Secondary | ICD-10-CM

## 2018-12-02 NOTE — Progress Notes (Signed)
Eleno had a 5 beat run of ventricular tachycardia rate 175 with 3 intermittent couplets and a triplet.. Patient asymptomatic. Blood pressure 118/70 heart rate 108. Oxygen saturation 97% on room air. Deandra said that he took his medications as prescribed but did not sleep well last night. Sheddrick said that he drank 2 cups of coffee this morning as he normally does. Suanne Marker Barrett PAC paged and notified. Suanne Marker told the patient to eat a banana and to make sure that he gets enough rest. Will send today's ECG tracings to Dr Allison Quarry office for review.Will continue to monitor the patient throughout  the program. Exit heart rate 80. Blood pressure 118/82.Barnet Pall, RN,BSN 12/02/2018 10:17 AM

## 2018-12-04 ENCOUNTER — Encounter (HOSPITAL_COMMUNITY)
Admission: RE | Admit: 2018-12-04 | Discharge: 2018-12-04 | Disposition: A | Discharge status: Home / Self Care | Payer: Medicare Other | Source: Ambulatory Visit | Attending: Cardiology | Admitting: Cardiology

## 2018-12-04 ENCOUNTER — Other Ambulatory Visit: Payer: Self-pay

## 2018-12-04 DIAGNOSIS — I2111 ST elevation (STEMI) myocardial infarction involving right coronary artery: Secondary | ICD-10-CM | POA: Diagnosis not present

## 2018-12-04 DIAGNOSIS — Z955 Presence of coronary angioplasty implant and graft: Secondary | ICD-10-CM

## 2018-12-04 NOTE — Progress Notes (Addendum)
Discharge Progress Report  Patient Details  Name: Darryl [name] MRN: [id number] Date of Birth: 20-Dec-1950 Referring Provider:     Ruth from 10/22/2018 in [place]  Referring Provider  [name] MD       Number of Visits: 15  Reason for Discharge:  Patient reached a stable level of exercise. Patient independent in their exercise. Patient has met program and personal goals.  Smoking History:  Social History   Tobacco Use  Smoking Status Never Smoker  Smokeless Tobacco Never Used    Diagnosis:  ST elevation myocardial infarction involving right coronary artery (HCC)  Status post coronary artery stent placement  ADL UCSD:   Initial Exercise Prescription: Initial Exercise Prescription - 10/22/18 1500      Date of Initial Exercise RX and Referring Provider   Date  10/22/18    Referring Provider  [name] MD    Expected Discharge Date  12/04/18      Treadmill   MPH  2.3    Grade  1    Minutes  15    METs  3.08      NuStep   Level  2    SPM  75    Minutes  15    METs  3      Prescription Details   Frequency (times per week)  3x    Duration  Progress to 30 minutes of continuous aerobic without signs/symptoms of physical distress      Intensity   THRR 40-80% of Max Heartrate  60-121    Ratings of Perceived Exertion  11-13    Perceived Dyspnea  0-4      Progression   Progression  Continue progressive overload as per policy without signs/symptoms or physical distress.      Resistance Training   Training Prescription  Yes    Weight  3lbs    Reps  10-15       Discharge Exercise Prescription (Final Exercise Prescription Changes): Exercise Prescription Changes - 12/04/18 1000      Response to Exercise   Blood Pressure (Admit)  112/68    Blood Pressure (Exercise)  122/70    Blood Pressure (Exit)  116/76    Heart Rate (Admit)  78 bpm    Heart Rate (Exercise)  131 bpm    Heart  Rate (Exit)  86 bpm    Rating of Perceived Exertion (Exercise)  12    Symptoms  none    Comments  Pt graduated Cr program today.     Duration  Continue with 30 min of aerobic exercise without signs/symptoms of physical distress.    Intensity  THRR unchanged      Progression   Progression  Continue to progress workloads to maintain intensity without signs/symptoms of physical distress.    Average METs  5.2      Resistance Training   Training Prescription  Yes    Weight  5lbs    Reps  10-15    Time  10 Minutes      Interval Training   Interval Training  No      Treadmill   MPH  2.8    Grade  3    Minutes  15    METs  4.3      NuStep   Level  6    SPM  85    Minutes  15    METs  6.1  Home Exercise Plan   Plans to continue exercise at  Home (comment)   Walking   Frequency  Add 4 additional days to program exercise sessions.    Initial Home Exercises Provided  11/09/18       Functional Capacity: 6 Minute Walk    Row Name 10/22/18 1515 11/27/18 1109       6 Minute Walk   Phase  Initial  Initial    Distance  1841 feet  2000 feet    Distance % Change  -  10.25 %    Distance Feet Change  -  186 ft    Walk Time  6 minutes  6 minutes    # of Rest Breaks  0  0    MPH  3.44  3.7    METS  3.61  4.4    RPE  12  12    Perceived Dyspnea   0  0    VO2 Peak  12.63  15.7    Symptoms  No  No    Resting HR  75 bpm  83 bpm    Resting BP  104/62  114/64    Resting Oxygen Saturation   96 %  -    Exercise Oxygen Saturation  during 6 min walk  97 %  -    Max Ex. HR  101 bpm  125 bpm    Max Ex. BP  110/70  148/70    2 Minute Post BP  106/68  104/70       Psychological, QOL, Others - Outcomes: PHQ 2/9: Depression screen [facility] 2/9 12/04/2018 10/22/2018 10/02/2018 02/24/2018 02/23/2018  Decreased Interest 0 0 0 0 0  Down, Depressed, Hopeless 0 0 0 0 0  PHQ - 2 Score 0 0 0 0 0  Altered sleeping - - - - -  Tired, decreased energy - - - - -  Change in appetite - - - - -   Feeling bad or failure about yourself  - - - - -  Trouble concentrating - - - - -  Moving slowly or fidgety/restless - - - - -  Suicidal thoughts - - - - -  PHQ-9 Score - - - - -    Quality of Life: Quality of Life - 11/27/18 1116      Quality of Life   Select  Quality of Life      Quality of Life Scores   Health/Function Post  28.8 %    Socioeconomic Post  27.75 %    Psych/Spiritual Post  30 %    Family Post  28.8 %    GLOBAL Post  28.8 %       Personal Goals: Goals established at orientation with interventions provided to work toward goal. Personal Goals and Risk Factors at Admission - 10/22/18 1519      Core Components/Risk Factors/Patient Goals on Admission    Weight Management  Weight Loss    Diabetes  Yes    Intervention  Provide education about signs/symptoms and action to take for hypo/hyperglycemia.;Provide education about proper nutrition, including hydration, and aerobic/resistive exercise prescription along with prescribed medications to achieve blood glucose in normal ranges: Fasting glucose 65-99 mg/dL    Expected Outcomes  Long Term: Attainment of HbA1C < 7%.;Short Term: Participant verbalizes understanding of the signs/symptoms and immediate care of hyper/hypoglycemia, proper foot care and importance of medication, aerobic/resistive exercise and nutrition plan for blood glucose control.    Hypertension  Yes  Intervention  Provide education on lifestyle modifcations including regular physical activity/exercise, weight management, moderate sodium restriction and increased consumption of fresh fruit, vegetables, and low fat dairy, alcohol moderation, and smoking cessation.;Monitor prescription use compliance.    Expected Outcomes  Short Term: Continued assessment and intervention until BP is < 140/25m HG in hypertensive participants. < 130/869mHG in hypertensive participants with diabetes, heart failure or chronic kidney disease.;Long Term: Maintenance of blood  pressure at goal levels.    Lipids  Yes    Intervention  Provide education and support for participant on nutrition & aerobic/resistive exercise along with prescribed medications to achieve LDL <7073mHDL >57m66m  Expected Outcomes  Short Term: Participant states understanding of desired cholesterol values and is compliant with medications prescribed. Participant is following exercise prescription and nutrition guidelines.;Long Term: Cholesterol controlled with medications as prescribed, with individualized exercise RX and with personalized nutrition plan. Value goals: LDL < 70mg63mL > 40 mg.        Personal Goals Discharge: Goals and Risk Factor Review    Row Name 10/26/18 0854 11/11/18 1315 12/04/18 1032         Core Components/Risk Factors/Patient Goals Review   Personal Goals Review  Weight Management/Obesity;Hypertension;Lipids;Diabetes  Weight Management/Obesity;Hypertension;Lipids;Diabetes  Weight Management/Obesity;Hypertension;Lipids;Diabetes     Review  Pt with multiple CAD RFs willing to participate in CR exercise.  LarryEdnad like to get back to using the ellipitical.  Pt with multiple CAD RFs willing to participate in CR exercise.  LarryKubabeen doing well with exercise. Traevion's vital signs weights have been stable  LarryAdonuswell with exercise at cardiac rehab. Duvid's vital signs have been stable. LarryMasayoshis [facility] R[facility] his [facility]. LarryLakotas to also use virtual rehab     Expected Outcomes  Pt will continue to participate in CR exercise, nutrition, and lifestyle modification opportunities.  Pt will continue to participate in CR exercise, nutrition, and lifestyle modification opportunities.  Pt will continue to participate in CR exercise, nutrition, and lifestyle modification opportunities upon graduation from phase 2 cardiac rehab.        Exercise Goals and Review: Exercise Goals    Row Name 10/22/18 1517              Exercise Goals   Increase Physical Activity  Yes       Intervention  Provide advice, education, support and counseling about physical activity/exercise needs.;Develop an individualized exercise prescription for aerobic and resistive training based on initial evaluation findings, risk stratification, comorbidities and participant's personal goals.       Expected Outcomes  Short Term: Attend rehab on a regular basis to increase amount of physical activity.;Long Term: Add in home exercise to make exercise part of routine and to increase amount of physical activity.;Long Term: Exercising regularly at least 3-5 days a week.       Increase Strength and Stamina  Yes       Intervention  Provide advice, education, support and counseling about physical activity/exercise needs.;Develop an individualized exercise prescription for aerobic and resistive training based on initial evaluation findings, risk stratification, comorbidities and participant's personal goals.       Expected Outcomes  Short Term: Increase workloads from initial exercise prescription for resistance, speed, and METs.;Short Term: Perform resistance training exercises routinely during rehab and add in resistance training at home;Long Term: Improve cardiorespiratory fitness, muscular endurance and strength as measured by increased METs and functional capacity (6MWT)  Able to understand and use rate of perceived exertion (RPE) scale  Yes       Intervention  Provide education and explanation on how to use RPE scale       Expected Outcomes  Short Term: Able to use RPE daily in rehab to express subjective intensity level;Long Term:  Able to use RPE to guide intensity level when exercising independently       Knowledge and understanding of Target Heart Rate Range (THRR)  Yes       Intervention  Provide education and explanation of THRR including how the numbers were predicted and where they are located for reference       Expected Outcomes  Short  Term: Able to state/look up THRR;Long Term: Able to use THRR to govern intensity when exercising independently;Short Term: Able to use daily as guideline for intensity in rehab       Able to check pulse independently  Yes       Intervention  Provide education and demonstration on how to check pulse in carotid and radial arteries.;Review the importance of being able to check your own pulse for safety during independent exercise       Expected Outcomes  Short Term: Able to explain why pulse checking is important during independent exercise;Long Term: Able to check pulse independently and accurately       Understanding of Exercise Prescription  Yes       Intervention  Provide education, explanation, and written materials on patient's individual exercise prescription       Expected Outcomes  Short Term: Able to explain program exercise prescription;Long Term: Able to explain home exercise prescription to exercise independently          Exercise Goals Re-Evaluation: Exercise Goals Re-Evaluation    Row Name 10/26/18 0900 11/09/18 0856 12/04/18 1022         Exercise Goal Re-Evaluation   Exercise Goals Review  Increase Physical Activity;Able to check pulse independently;Able to understand and use rate of perceived exertion (RPE) scale;Knowledge and understanding of Target Heart Rate Range (THRR)  Increase Physical Activity;Able to check pulse independently;Able to understand and use rate of perceived exertion (RPE) scale;Knowledge and understanding of Target Heart Rate Range (THRR);Increase Strength and Stamina;Understanding of Exercise Prescription  Increase Physical Activity;Increase Strength and Stamina;Able to understand and use rate of perceived exertion (RPE) scale;Knowledge and understanding of Target Heart Rate Range (THRR);Able to check pulse independently;Understanding of Exercise Prescription     Comments  Patient able to understand and use RPE scale appropriately. Reviewed THRR, and patient has  heart rate monitor that he can check his pulse with. Patient is walking 45-60 minutes daily as his mode of home exericse in addition to exericse at cardiac rehab.  Reviewed home exercise guidelines including THRR, RPE scale, and endpoints for exercise. Pt is walking 45-60 minutes daily and using 5lb hand weights for his resistance training. Pt uses the weights QOD. Pt has a stationary bike that he plans to use more once it gets cooler because of it's location, and pt is getting an outdoor bike in the next few weeks that he's looking forward to using. Pt's short term goal is to lose 10lbs, and his long term goal is to be able to use the elliptical machine at his gym for at least 30 minutes.  Pt graduated Cr program today. Pt progressed well in the program and ended with a MET level of 5.2. Pt is going to continue exercising at a gym by  using a wlkaing track, elliptical, and weights. Pt stated he has ordered a bike to cycle outside.     Expected Outcomes  Increase workloads as tolerated to help achieve personal health and fitness goals.  Patient will continue daily exercise routine to help improve stamina and help achieve weight loss goal.  Pt will continue exercising on his own.        Nutrition & Weight - Outcomes: Pre Biometrics - 10/22/18 1516      Pre Biometrics   Height  5' 5.75" (1.67 m)    Weight  80.3 kg    Waist Circumference  42 inches    Hip Circumference  37 inches    Waist to Hip Ratio  1.14 %    BMI (Calculated)  28.79    Triceps Skinfold  18 mm    % Body Fat  30 %    Grip Strength  26 kg    Flexibility  11 in    Single Leg Stand  2.37 seconds      Post Biometrics - 11/27/18 1114       Post  Biometrics   Height  5' 5.75" (1.67 m)    Weight  80 kg    Waist Circumference  42 inches    Hip Circumference  37 inches    Waist to Hip Ratio  1.14 %    BMI (Calculated)  28.69    Triceps Skinfold  18 mm    % Body Fat  29.9 %    Grip Strength  34 kg    Flexibility  11.5 in     Single Leg Stand  9 seconds       Nutrition:   Nutrition Discharge:   Education Questionnaire Score: Knowledge Questionnaire Score - 11/27/18 1116      Knowledge Questionnaire Score   Post Score  21/24       Goals reviewed with patient; copy given to patient.Pt graduated from cardiac rehab program today with completion of 15 exercise sessions in Phase II. Pt maintained good attendance and progressed nicely during his participation in rehab as evidenced by increased MET level.   Medication list reconciled. Repeat  PHQ score- 0 .  Pt has made significant lifestyle changes and should be commended for his success. [place] increased his distance on his post exercise walk test and maintained his current weight. Pt feels he has achieved his goals during cardiac rehab.   Pt plans to continue exercise at the [facility] [place], walking and riding his bike.Fritz Pickerel decided to participate in virtual cardiac rehab. Will obtain consent and have the patient download the virtual APP.           Confirm Consent - In the setting of the current Covid19 crisis, you are scheduled for a phone visit with your Cardiac Rehab Team.  Just as we do with many in-gym visits, in order for you to participate in this visit, we must obtain consent.  If you'd like, I can send this to your mychart (if signed up) or email for you to review.  Otherwise, I can obtain your verbal consent now.  By agreeing to a telephone visit, we'd like you to understand that the technology does not allow for your Cardiac or Pulmonary Rehab team member to perform a physical assessment, and thus may limit their ability to fully assess your ability to perform exercise programs. If your provider identifies any concerns that need to be evaluated in person, we will make arrangements  to do so.  Finally, though the technology is pretty good, we cannot assure that it will always work on either your or our end and we cannot ensure that we have a secure  connection.  Cardiac and Pulmonary Rehab Telehealth visits and "At Home" cardiac and pulmonary rehab are provided at no cost to you.        Are you willing to proceed?"        STAFF: Did the patient verbally acknowledge consent to telehealth visit? Document YES/NO here: Yes     Barnet Pall RN  Cardiac and Pulmonary Rehab Staff        Date 12/04/18    @ Time 10:24  Spoke to pt regarding Virtual Cardiac  Rehab in person  Bobbye  was able to download the [employer] app on their smart device with no issues. Pt set up their account and received the following welcome message -"Welcome to the [facility] and Pulmonary Rehabilitation program. We hope that you will find the exercise program beneficial in your recovery process. Our staff is available to assist with any questions/concerns about your exercise routine. Best wishes". Brief orientation provided to with the advisement to watch the "Intro to Rehab" series located under the Resource tab. Pt verbalized understanding. Will continue to follow and monitor pt progress with feedback as needed.

## 2018-12-07 ENCOUNTER — Ambulatory Visit (INDEPENDENT_AMBULATORY_CARE_PROVIDER_SITE_OTHER): Payer: Medicare Other | Admitting: Pharmacist Clinician (PhC)/ Clinical Pharmacy Specialist

## 2018-12-07 ENCOUNTER — Other Ambulatory Visit: Payer: Self-pay

## 2018-12-07 DIAGNOSIS — I236 Thrombosis of atrium, auricular appendage, and ventricle as current complications following acute myocardial infarction: Secondary | ICD-10-CM | POA: Diagnosis not present

## 2018-12-07 DIAGNOSIS — Z7901 Long term (current) use of anticoagulants: Secondary | ICD-10-CM | POA: Diagnosis not present

## 2018-12-07 DIAGNOSIS — I4891 Unspecified atrial fibrillation: Secondary | ICD-10-CM | POA: Diagnosis not present

## 2018-12-07 LAB — POCT INR: INR: 1.8 — AB (ref 2.0–3.0)

## 2018-12-07 NOTE — Patient Instructions (Signed)
Take 1 tablet today Monday Sept 14, then continue with 41m daily except for 2.576mon Mondays. Repeat INR in 2 weeks. Call us[place]oreaith any medication changes or concerns # [phone].

## 2018-12-11 ENCOUNTER — Other Ambulatory Visit: Payer: Self-pay | Admitting: Cardiology

## 2018-12-13 ENCOUNTER — Other Ambulatory Visit: Payer: Self-pay | Admitting: Internal Medicine

## 2018-12-18 ENCOUNTER — Ambulatory Visit: Payer: Medicare Other | Admitting: Cardiology

## 2018-12-22 ENCOUNTER — Other Ambulatory Visit: Payer: Self-pay

## 2018-12-22 ENCOUNTER — Encounter: Payer: Self-pay | Admitting: Cardiology

## 2018-12-22 ENCOUNTER — Ambulatory Visit (INDEPENDENT_AMBULATORY_CARE_PROVIDER_SITE_OTHER): Payer: Medicare Other | Admitting: Cardiology

## 2018-12-22 ENCOUNTER — Ambulatory Visit (INDEPENDENT_AMBULATORY_CARE_PROVIDER_SITE_OTHER): Payer: Medicare Other | Admitting: Pharmacist Clinician (PhC)/ Clinical Pharmacy Specialist

## 2018-12-22 VITALS — BP 134/88 | HR 71 | Temp 97.3°F | Ht 66.0 in | Wt 176.2 lb

## 2018-12-22 DIAGNOSIS — E785 Hyperlipidemia, unspecified: Secondary | ICD-10-CM | POA: Diagnosis not present

## 2018-12-22 DIAGNOSIS — I236 Thrombosis of atrium, auricular appendage, and ventricle as current complications following acute myocardial infarction: Secondary | ICD-10-CM | POA: Diagnosis not present

## 2018-12-22 DIAGNOSIS — Z7901 Long term (current) use of anticoagulants: Secondary | ICD-10-CM

## 2018-12-22 DIAGNOSIS — I2510 Atherosclerotic heart disease of native coronary artery without angina pectoris: Secondary | ICD-10-CM

## 2018-12-22 DIAGNOSIS — Z9861 Coronary angioplasty status: Secondary | ICD-10-CM

## 2018-12-22 DIAGNOSIS — E118 Type 2 diabetes mellitus with unspecified complications: Secondary | ICD-10-CM

## 2018-12-22 DIAGNOSIS — I255 Ischemic cardiomyopathy: Secondary | ICD-10-CM

## 2018-12-22 DIAGNOSIS — I10 Essential (primary) hypertension: Secondary | ICD-10-CM | POA: Diagnosis not present

## 2018-12-22 DIAGNOSIS — I951 Orthostatic hypotension: Secondary | ICD-10-CM

## 2018-12-22 LAB — POCT INR: INR: 2.7 (ref 2.0–3.0)

## 2018-12-22 MED ORDER — METOPROLOL SUCCINATE ER 25 MG PO TB24: 25.0000 mg | ORAL_TABLET | Freq: Every day | ORAL | 3 refills | Status: DC

## 2018-12-22 NOTE — Progress Notes (Signed)
PCP: Janith Lima, MD  Clinic Note: Chief Complaint  Patient presents with  . Follow-up    Doing well; rarely is ever to get rid of warfarin.  . Coronary Artery Disease  . Cardiomyopathy    Notable improvement on cardiac MRI, but LV apical thrombus noted    HPI: Darryl [name] is a 68 y.o. male with a PMH notable for recent inferior STEMI found to have CTO LAD (had PCI of occluded RCA followed by staged PCI of Ramus Intermedius) with ischemic cardiomyopathy below who presents today to discuss results of his cardiac MRI revealing somewhat improved EF, but LV apical thrombus.  --> He has been limited as far as medication medical management for his cardiomyopathy based on him having orthostatic hypotension requiring Midodrine  Admitted from 6/29-7/1 2020 for anemia with hemoglobin down to 5.0.  Had no evidence of melena, hematochezia or hematuria.  FOBT was negative.  Noted to have macrocytic anemia, treated with 4 units PRBC. -->  Was thought to be related to Crohn's medications (azathioprine) --> this was not thought to be related to a GI bleed.  Therefore was placed back on Brilinta but no aspirin.  Midrin continued for orthostatic hypotension.   WAGNER TANZI was last seen by me on July 16.  This was in follow-up for his cardiac MRI which basically showed that the LAD distribution was unfavorable for viable recovery with LAD revascularization.  The best news of the MRI was that there was evidence of notably improved EF up to 45-50% with the downside being LV apical thrombus noted. --> He was back walking 1 to 1-1/2 miles up to 2 miles a day and without any symptoms.  No angina or dyspnea. --> C MRI showed LV thrombus so he was converted from Brilinta to Plavix and was started on warfarin with plans to be on it for minimum of 6 months. --> Still felt to be a poor candidate for antihypertensives such as beta-blockers or ARB use as he still required midodrine.  (However he has been  started back on low-dose Micardis.).  Recent Hospitalizations:   None  Studies Personally Reviewed - (if available, images/films reviewed: From Epic Chart or Care Everywhere)  No new studies  Interval History: Darryl [name] presents today for close 37-monthfollow-up to see how he is doing.  He actually is pretty much back to his routine level of exercise.  He is playing golf and still walking anywhere from a mile to 2 miles a day no exertional dyspnea.  No chest tightness or pressure with rest or exertion. He really enjoyed cardiac rehab noted on his last day that he had some PVCs that popped up and there was a short 6 7 beat run of nonsustained VT.  Pretty much the only time he feels any kind of irregular heartbeat is when his heart rate goes up with exercise he may feel some pounding.  He has not really felt any PVCs.  No syncope/near syncope or TIA since amaurosis fugax.  Despite having "cardiomyopathy "he has not really had any PND, orthopnea or edema.  Nothing to suggest heart failure.  No claudication. No TIA shows amaurosis fugax. No melena, hematochezia, hematuria, epistaxis and no recent flare of Crohn's.  ROS: A comprehensive was performed. Review of Systems  Constitutional: Negative for malaise/fatigue and weight loss.  HENT: Negative for congestion and nosebleeds.   Respiratory: Negative for shortness of breath.   Cardiovascular: Negative for claudication and leg swelling.  Gastrointestinal: Positive  for abdominal pain (Pretty well controlled though). Negative for blood in stool, constipation and diarrhea.  Musculoskeletal: Negative for falls and myalgias.  Neurological: Negative for dizziness (Stable taking a.m. dose of midodrine), focal weakness and weakness.  Psychiatric/Behavioral: Negative.   All other systems reviewed and are negative.  I have reviewed and (if needed) personally updated the patient's problem list, medications, allergies, past medical and surgical history,  social and family history.   Past Medical History:  Diagnosis Date  . Acute upper respiratory infections of unspecified site   . Allergic rhinitis, cause unspecified   . Anal stenosis   . Asthma   . Cataract   . Chronic orthostatic hypotension    Related to borderline hypotension; intolerant of blood pressure medications.  On Midodrine  . Coronary artery disease involving native coronary artery of native heart without angina pectoris     p-mRCA 100% (DES PCI - p-mRCA 100% (DES PCI - Resolute Onyx 3.5 x 26 --> ~3.7 mm)), dRCA ~50% & PAV 40%; pRI 85% (Staged DES PCI  Resolute Onyx 2.25 x 15 - ~2.5 mm). 100% LAD CTO after small D1 with ost-prox 60%. R-L collaterals to LAD seen post PCI.  Marland Kitchen Crohn's colitis ([place])   . Exostosis of unspecified site   . GERD (gastroesophageal reflux disease)   . History of acute inferior wall MI 06/2018   100% p-mRCA -- DES PCI (complicated by mild cardiogenic shock, partially related to 100% LAD CTO) --> staged PCI-RI  . Hyperlipidemia with target LDL less than 70 07/04/2015   Now s/p MI with MV CAD - optimal LDL < 50  . Hyperplastic colon polyp 02/26/2008  . Hypertension   . Ischemic cardiomyopathy    Initial echo shows EF of 40 to 45% with anterior hypokinesis. Cardiac MRI shows EF of 49% with anterior anteroapical hypokinesis/akinesis and LV thrombus.  . Lateral epicondylitis  of elbow   . Left ventricular thrombus following MI ([place]) 10/10/2018   Cardiac MRI 09/28/2018: Mildly dilated LV.  EF estimated roughly 49%.  Hypokinesis of the mid anterior, anteroseptal and apical anterior wall.  There is also apical thrombus (partially layered) 22 x 17 x 11.  . Leukopenia   . Macrocytic anemia    Related to azathioprine  . Nontraumatic rupture of patellar tendon   . Obesity, unspecified   . Regional enteritis of unspecified site   . Seasonal allergies   . Sessile colonic polyp   . Vitamin B12 deficiency     Past Surgical History:  Procedure Laterality Date  .  CARDIAC MRI  09/2018   Mildly dilated LV.  EF estimated roughly 49%.  Hypokinesis of the mid anterior, anteroseptal and apical anterior wall.  There is also apical thrombus (partially layered) 22 x 17 x 11. -->  50-75% late gadolinium enhancement of the mid anterior, anteroseptal and apical anterior and septal walls suggestive of near full-thickness scar w/ minimal viability (poor recovery prognosis w/ REVASCULARIZATION  . COLONOSCOPY     2016  . CORONARY STENT INTERVENTION N/A 07/24/2018   Procedure: CORONARY STENT INTERVENTION;  Surgeon: Troy Sine, MD;  Location: [facility];  Service: Cardiovascular: Staged PCI pRI 85-90%: Resolute Onyx DES 2.25 x 15 (2.5 mm)  . CORONARY/GRAFT ACUTE MI REVASCULARIZATION N/A 07/23/2018   Procedure: CORONARY/GRAFT ACUTE MI REVASCULARIZATION;  Surgeon: [name], MD;  Location: [facility];  Service: Cardiovascular;::  p-mRCA 100% (DES PCI - p-mRCA 100% (DES PCI - Resolute Onyx 3.5 x 26 (post-dilated ~  3.7 mm)), dRCA ~50% & PAV 40%; pRI 85% (Staged PCI). 100% LAD CTO after small D1 with ost-prox 60%. R-L collaterals to LAD seen post PCI.;   . LEFT HEART CATH N/A 07/24/2018   Procedure: LEFT HEART CATH;  Surgeon: Troy Sine, MD;  Location: [facility];  Service: Cardiovascular;  Laterality: N/A; 85-90% pRCA, 60% ost D1 then 100% CTO of LAD -- staged DES PCI LAD  . LEFT HEART CATH AND CORONARY ANGIOGRAPHY  07/23/2018   Procedure: LEFT HEART CATH;  Surgeon: [name]., MD;  [facility]   p-mRCA 100% (DES PCI),dRCA ~50% & PAV 40%; pRI 85% (Staged PCI). 100% LAD CTO after small D1 with ost-prox 60%. R-L collaterals to LAD seen post PCI.;   . RIGHT HEART CATH N/A 07/23/2018   Procedure: RIGHT HEART CATH;  Surgeon: [name], MD;  Location: [facility];  Service: Cardiovascular;  Normal RHC #s, CO/CI normal  . TONSILLECTOMY AND ADENOIDECTOMY    . TRANSTHORACIC ECHOCARDIOGRAM  07/23/2018   Insetting of  inferior MI with occluded LAD CTO; --Severe akinesis of the mid anteroseptal wall.  EF 40 and 45%.  GR 1 DD.;;      Current Meds  Medication Sig  . albuterol (VENTOLIN HFA) 108 (90 Base) MCG/ACT inhaler Inhale 2 puffs into the lungs every 6 (six) hours as needed for wheezing.  . cholecalciferol (VITAMIN D3) 25 MCG (1000 UT) tablet Take 1,000 Units by mouth daily.  . clopidogrel (PLAVIX) 75 MG tablet Take 1 tablet (75 mg total) by mouth daily.  . fluticasone-salmeterol (ADVAIR HFA) 115-21 MCG/ACT inhaler USE 2 PUFFS TWICE DAILY AS NEEDED. SHAKE WELL (Patient taking differently: Inhale 2 puffs into the lungs 2 (two) times daily. )  . folic acid (FOLVITE) 1 MG tablet TAKE 1 TABLET ONCE DAILY. (Patient taking differently: Take 1 mg by mouth daily. )  . hydrocortisone 2.5 % cream Apply 1 application topically daily.   Marland Kitchen loperamide (IMODIUM A-D) 2 MG tablet Take 2 mg by mouth 4 (four) times daily as needed for diarrhea or loose stools.  . midodrine (PROAMATINE) 2.5 MG tablet Take 2.5 mg every morning , may take an additional dose after 6 hr if still dizzy or for systloic BP less than 95 bpm with dizziness.  . montelukast (SINGULAIR) 10 MG tablet Take 1 tablet (10 mg total) by mouth at bedtime.  . Multiple Vitamins-Minerals (CENTRUM SILVER PO) Take 1 tablet by mouth daily.  . nitroGLYCERIN (NITROSTAT) 0.4 MG SL tablet Place 1 tablet (0.4 mg total) under the tongue every 5 (five) minutes x 3 doses as needed for chest pain.  . Omega-3 Fatty Acids (DIALYVITE OMEGA-3 CONCENTRATE) 600 MG CAPS Take 1 tablet by mouth 2 (two) times daily.  . pantoprazole (PROTONIX) 40 MG tablet Take 1 tablet (40 mg total) by mouth daily.  . rosuvastatin (CRESTOR) 40 MG tablet Take 1 tablet (40 mg total) by mouth daily.  Marland Kitchen telmisartan (MICARDIS) 40 MG tablet TAKE 1 TABLET ONCE DAILY.  . traMADol (ULTRAM) 50 MG tablet TAKE ONE TABLET EVERY 6 HOURS AS NEEDED. (Patient taking differently: Take 50 mg by mouth every 6 (six) hours as  needed (pain). )  . vitamin B-12 (CYANOCOBALAMIN) 1000 MCG tablet Take 1 tablet (1,000 mcg total) by mouth daily.  Marland Kitchen warfarin (COUMADIN) 5 MG tablet Take 1 to 1.5 tablets by mouth daily as directed by coumadin clinic    Allergies  Allergen Reactions  . Allopurinol Other (  See Comments)    Bone marrow suppression  . Asacol [Mesalamine] Hives    Trouble swallowing, loss of appetite  . Mercaptopurine Other (See Comments)    Bone marrow suppression  . Amoxicillin Rash    Did it involve swelling of the face/tongue/throat, SOB, or low BP? Unknown Did it involve sudden or severe rash/hives, skin peeling, or any reaction on the inside of your mouth or nose? Unknown Did you need to seek medical attention at a hospital or doctor's office? Unknown When did it last happen?b/c of penicillin allergy If all above answers are "NO", may proceed with cephalosporin use.   Marland Kitchen Penicillins Rash    Did it involve swelling of the face/tongue/throat, SOB, or low BP? Yes Did it involve sudden or severe rash/hives, skin peeling, or any reaction on the inside of your mouth or nose? No Did you need to seek medical attention at a hospital or doctor's office? No When did it last happen?more than 15 yrs If all above answers are "NO", may proceed with cephalosporin use.   . Sulfamethoxazole Rash    Social History   Tobacco Use  . Smoking status: Never Smoker  . Smokeless tobacco: Never Used  Substance Use Topics  . Alcohol use: Yes    Alcohol/week: 0.0 standard drinks    Comment: rare  . Drug use: No   Social History   Social History Narrative  . Not on file    family history includes Diabetes in his brother and father; Heart attack in his father; Heart disease in his father; Hypertension in his father.  Wt Readings from Last 3 Encounters:  12/22/18 176 lb 3.2 oz (79.9 kg)  11/27/18 176 lb 5.9 oz (80 kg)  10/22/18 177 lb 0.5 oz (80.3 kg)    PHYSICAL EXAM BP 134/88   Pulse 71   Temp  (!) 97.3 F (36.3 C)   Ht 5' 6"  (1.676 m)   Wt 176 lb 3.2 oz (79.9 kg)   SpO2 95%   BMI 28.44 kg/m  Physical Exam  Constitutional: He is oriented to person, place, and time. He appears well-developed and well-nourished.  Healthy-appearing gentleman.  No acute distress.  Well-groomed  HENT:  Head: Normocephalic and atraumatic.  Neck: Normal range of motion. Neck supple. No hepatojugular reflux and no JVD present. Carotid bruit is not present. No thyromegaly present.  Cardiovascular: Normal rate, regular rhythm, normal heart sounds, intact distal pulses and normal pulses.  No extrasystoles are present. PMI is not displaced. Exam reveals no gallop and no friction rub.  No murmur heard. Pulmonary/Chest: Effort normal and breath sounds normal. No respiratory distress. He has no wheezes. He has no rales.  Abdominal: Soft. Bowel sounds are normal. He exhibits no distension. There is no abdominal tenderness.  Musculoskeletal: Normal range of motion.        General: No edema.  Neurological: He is alert and oriented to person, place, and time.  Psychiatric: His behavior is normal. Judgment and thought content normal.  Vitals reviewed.   Adult ECG Report Not checked  Other studies Reviewed: Additional studies/ records that were reviewed today include:  Recent Labs:   Lab Results  Component Value Date   HGBA1C 5.9 10/01/2018    CBC Latest Ref Rng & Units 10/28/2018 09/29/2018 09/23/2018  WBC 4.0 - 10.5 K/uL 5.8 2.8(L) 3.0(L)  Hemoglobin 13.0 - 17.0 g/dL 12.3(L) 10.2(L) 8.5(L)  Hematocrit 39.0 - 52.0 % 36.1(L) 28.9(L) 24.1(L)  Platelets 150.0 - 400.0 K/uL 181.0 228.0 131(L)  Lab Results  Component Value Date   CREATININE 1.19 09/29/2018   BUN 16 09/29/2018   NA 140 09/29/2018   K 4.0 09/29/2018   CL 107 09/29/2018   CO2 24 09/29/2018   Lab Results  Component Value Date   CHOL 117 07/24/2018   HDL 64 07/24/2018   LDLCALC 39 07/24/2018   TRIG 72 07/24/2018   CHOLHDL 1.8 07/24/2018      ASSESSMENT / PLAN: Problem List Items Addressed This Visit    Coronary artery disease involving native coronary artery of native heart without angina pectoris - Primary (Chronic)    Revascularized RCA and ramus intermedius with an occluded LAD that does not seem to have a viable territory based on cardiac MRI.  No suggestion of ischemia in that distribution either.  As such, CTO PCI is probably not warranted.  Up till now, he had not been on beta-blocker in the past because of lowish blood pressures.  Now his pressures seem to be stable and he is more active.  I am a little bit concerned about the PVCs and short runs in and of NSVT that he had during cardiac rehab.  Plan: We will start low-dose Toprol (if blood pressure is too low, would withhold telmisartan). On stable dose of rosuvastatin and clopidogrel      Relevant Medications   metoprolol succinate (TOPROL XL) 25 MG 24 hr tablet   Other Relevant Orders   ECHOCARDIOGRAM COMPLETE   CAD S/P percutaneous coronary angioplasty (Chronic)    He has DES in both the RCA and ramus intermedius with an occluded LAD.  No active angina.  No signs of stent stenosis or thrombosis. With him having been started on warfarin, we switched from Brilinta to Plavix. -->  He will be on Plavix uninterrupted until April 2021 at which time it can be held for procedures.      Relevant Medications   metoprolol succinate (TOPROL XL) 25 MG 24 hr tablet   Other Relevant Orders   ECHOCARDIOGRAM COMPLETE   Left ventricular thrombus following MI ([facility]) (Chronic)    At this point I think with a apical thrombus we will treat as if this is post MI and do 6 months of warfarin.  He should be due for recheck an echocardiogram at roughly that time to reevaluate.  We will do echocardiogram with contrast. If there is no evidence of thrombus, we can then stop warfarin and continue Plavix      Relevant Medications   metoprolol succinate (TOPROL XL) 25 MG 24 hr tablet    Other Relevant Orders   ECHOCARDIOGRAM COMPLETE   Essential hypertension, benign (Chronic)    Seems to be tolerating low-dose ARB now that was stopped started apparently by his PCP.  I think he can probably also tolerate low-dose Toprol.  Had issues with hypotension in the past and is still on Midodrine in the morning.      Relevant Medications   metoprolol succinate (TOPROL XL) 25 MG 24 hr tablet   Hyperlipidemia with target LDL less than 70 (Chronic)    As of May, lipids look great.  Continue current dose of rosuvastatin.  No myalgias or arthralgias.      Relevant Medications   metoprolol succinate (TOPROL XL) 25 MG 24 hr tablet   Ischemic cardiomyopathy (Chronic)    Notably improved EF on cardiac MRI after revascularization.  No real heart failure symptoms.      Relevant Medications   metoprolol succinate (TOPROL XL) 25 MG  24 hr tablet   Orthostatic hypotension    Doing well.  Only using morning dose of Midodrine as he really only notes dizziness in the mornings.  We will have him split up his Toprol and Micardis to avoid drops in blood pressure.      Relevant Medications   metoprolol succinate (TOPROL XL) 25 MG 24 hr tablet   Type II diabetes mellitus with manifestations ([place])   Long term (current) use of anticoagulants    Plan for now is to complete 6 months of warfarin.  We will recheck an echocardiogram roughly that time just to confirm or deny the ongoing presence of LV thrombus.  If it is layered and smooth, I think we can probably stop warfarin.         I spent a total of 20 minutes with the patient and chart review. >  50% of the time was spent in direct patient consultation.   Current medicines are reviewed at length with the patient today.  (+/- concerns) ready to discuss warfarin The following changes have been made:  See below  Patient Instructions  Medication Instructions:  START taking metoprolol succinate ( TOPROL XL) 25 MG  For the first 2 weeks take 1/2  tablet  , increase to 25 mg - pay attention to how you feel in the  Morning after month you can use your midodrine as needed.   If you need a refill on your cardiac medications before your next appointment, please call your pharmacy.   Lab work: NOT NEEDED     Testing/Procedures: Will be schedule in JAN 2021 at [place] has requested that you have an echocardiogram. Echocardiography is a painless test that uses sound waves to create images of your heart. It provides your doctor with information about the size and shape of your heart and how well your heart's chambers and valves are working. This procedure takes approximately one hour. There are no restrictions for this procedure.    Follow-Up: At [facility], you and your health needs are our priority.  As part of our continuing mission to provide you with exceptional heart care, we have created designated Provider Care Teams.  These Care Teams include your primary Cardiologist (physician) and Advanced Practice Providers (APPs -  Physician Assistants and Nurse Practitioners) who all work together to provide you with the care you need, when you need it. . You will need a follow up appointment in  6 months- March 2021.  Please call our office 2 months in advance to schedule this appointment.  You may see Glenetta Hew, MD or one of the following Advanced Practice Providers on your designated Care Team:   . Rosaria Ferries, PA-C . [name], DNP, ANP  Any Other Special Instructions Will Be Listed Below (If Applicable).    Studies Ordered:   Orders Placed This Encounter  Procedures  . ECHOCARDIOGRAM COMPLETE      Glenetta Hew, M.D., M.S. Interventional Cardiologist   Pager # [phone] Phone # [phone] [address] [place], [place] [address]   Thank you for choosing Heartcare at [facility]!!

## 2018-12-22 NOTE — Patient Instructions (Signed)
Medication Instructions:  START taking metoprolol succinate ( TOPROL XL) 25 MG  For the first 2 weeks take 1/2 tablet  , increase to 25 mg - pay attention to how you feel in the  Morning after month you can use your midodrine as needed.   If you need a refill on your cardiac medications before your next appointment, please call your pharmacy.   Lab work: NOT NEEDED     Testing/Procedures: Will be schedule in JAN 2021 at [place] has requested that you have an echocardiogram. Echocardiography is a painless test that uses sound waves to create images of your heart. It provides your doctor with information about the size and shape of your heart and how well your heart's chambers and valves are working. This procedure takes approximately one hour. There are no restrictions for this procedure.    Follow-Up: At [facility], you and your health needs are our priority.  As part of our continuing mission to provide you with exceptional heart care, we have created designated Provider Care Teams.  These Care Teams include your primary Cardiologist (physician) and Advanced Practice Providers (APPs -  Physician Assistants and Nurse Practitioners) who all work together to provide you with the care you need, when you need it. . You will need a follow up appointment in  6 months- March 2021.  Please call our office 2 months in advance to schedule this appointment.  You may see Glenetta Hew, MD or one of the following Advanced Practice Providers on your designated Care Team:   . Rosaria Ferries, PA-C . [name], DNP, ANP  Any Other Special Instructions Will Be Listed Below (If Applicable).

## 2018-12-24 ENCOUNTER — Encounter: Payer: Self-pay | Admitting: Cardiology

## 2018-12-24 NOTE — Assessment & Plan Note (Addendum)
Revascularized RCA and ramus intermedius with an occluded LAD that does not seem to have a viable territory based on cardiac MRI.  No suggestion of ischemia in that distribution either.  As such, CTO PCI is probably not warranted.  Up till now, he had not been on beta-blocker in the past because of lowish blood pressures.  Now his pressures seem to be stable and he is more active.  I am a little bit concerned about the PVCs and short runs in and of NSVT that he had during cardiac rehab.  Plan: We will start low-dose Toprol (if blood pressure is too low, would withhold telmisartan). On stable dose of rosuvastatin and clopidogrel

## 2018-12-24 NOTE — Assessment & Plan Note (Signed)
He has DES in both the RCA and ramus intermedius with an occluded LAD.  No active angina.  No signs of stent stenosis or thrombosis. With him having been started on warfarin, we switched from Brilinta to Plavix. -->  He will be on Plavix uninterrupted until April 2021 at which time it can be held for procedures.

## 2018-12-24 NOTE — Assessment & Plan Note (Addendum)
Doing well.  Only using morning dose of Midodrine as he really only notes dizziness in the mornings.  We will have him split up his Toprol and Micardis to avoid drops in blood pressure.

## 2018-12-24 NOTE — Assessment & Plan Note (Signed)
At this point I think with a apical thrombus we will treat as if this is post MI and do 6 months of warfarin.  He should be due for recheck an echocardiogram at roughly that time to reevaluate.  We will do echocardiogram with contrast. If there is no evidence of thrombus, we can then stop warfarin and continue Plavix

## 2018-12-24 NOTE — Assessment & Plan Note (Signed)
Plan for now is to complete 6 months of warfarin.  We will recheck an echocardiogram roughly that time just to confirm or deny the ongoing presence of LV thrombus.  If it is layered and smooth, I think we can probably stop warfarin.

## 2018-12-24 NOTE — Assessment & Plan Note (Signed)
Seems to be tolerating low-dose ARB now that was stopped started apparently by his PCP.  I think he can probably also tolerate low-dose Toprol.  Had issues with hypotension in the past and is still on Midodrine in the morning.

## 2018-12-24 NOTE — Assessment & Plan Note (Signed)
Notably improved EF on cardiac MRI after revascularization.  No real heart failure symptoms.

## 2018-12-24 NOTE — Assessment & Plan Note (Signed)
As of May, lipids look great.  Continue current dose of rosuvastatin.  No myalgias or arthralgias.

## 2019-01-03 ENCOUNTER — Other Ambulatory Visit: Payer: Self-pay | Admitting: Cardiology

## 2019-01-04 ENCOUNTER — Other Ambulatory Visit (INDEPENDENT_AMBULATORY_CARE_PROVIDER_SITE_OTHER): Payer: Medicare Other

## 2019-01-04 DIAGNOSIS — K5010 Crohn's disease of large intestine without complications: Secondary | ICD-10-CM

## 2019-01-04 LAB — CBC WITH DIFFERENTIAL/PLATELET
Basophils Absolute: 0 10*3/uL (ref 0.0–0.1)
Basophils Relative: 0.2 % (ref 0.0–3.0)
Eosinophils Absolute: 0 10*3/uL (ref 0.0–0.7)
Eosinophils Relative: 0.4 % (ref 0.0–5.0)
HCT: 42.4 % (ref 39.0–52.0)
Hemoglobin: 14.4 g/dL (ref 13.0–17.0)
Lymphocytes Relative: 30.8 % (ref 12.0–46.0)
Lymphs Abs: 1.7 10*3/uL (ref 0.7–4.0)
MCHC: 33.9 g/dL (ref 30.0–36.0)
MCV: 107.1 fl — ABNORMAL HIGH (ref 78.0–100.0)
Monocytes Absolute: 0.7 10*3/uL (ref 0.1–1.0)
Monocytes Relative: 12.9 % — ABNORMAL HIGH (ref 3.0–12.0)
Neutro Abs: 3 10*3/uL (ref 1.4–7.7)
Neutrophils Relative %: 55.7 % (ref 43.0–77.0)
Platelets: 218 10*3/uL (ref 150.0–400.0)
RBC: 3.96 Mil/uL — ABNORMAL LOW (ref 4.22–5.81)
RDW: 13.8 % (ref 11.5–15.5)
WBC: 5.4 10*3/uL (ref 4.0–10.5)

## 2019-01-15 ENCOUNTER — Ambulatory Visit (INDEPENDENT_AMBULATORY_CARE_PROVIDER_SITE_OTHER): Payer: Medicare Other | Admitting: Pharmacist Clinician (PhC)/ Clinical Pharmacy Specialist

## 2019-01-15 ENCOUNTER — Other Ambulatory Visit: Payer: Self-pay

## 2019-01-15 DIAGNOSIS — Z7901 Long term (current) use of anticoagulants: Secondary | ICD-10-CM

## 2019-01-15 DIAGNOSIS — I236 Thrombosis of atrium, auricular appendage, and ventricle as current complications following acute myocardial infarction: Secondary | ICD-10-CM

## 2019-01-15 LAB — POCT INR: INR: 2.6 (ref 2.0–3.0)

## 2019-01-18 ENCOUNTER — Telehealth: Payer: Self-pay

## 2019-01-18 NOTE — Telephone Encounter (Signed)
Pt is returning Darryl [name] call

## 2019-01-18 NOTE — Telephone Encounter (Signed)
Copied from [place] [phone]. Topic: General - Other >> Jan 18, 2019 11:26 AM Yvette Rack wrote: Reason for CRM: Pt would like to know if he is due for the Pneumococcal vaccine. Pt requests call back. >> Jan 18, 2019 11:37 AM Kristine Royal J wrote: Is patient due for 23 again?

## 2019-01-18 NOTE — Telephone Encounter (Signed)
lvm for pt to call back.  

## 2019-01-18 NOTE — Telephone Encounter (Signed)
Tried to call pt back - VM is now full.   RE: Let him know that he is due for a PNA vaccine and can schedule a nurse visit for PNA 23 (pneumovax).

## 2019-01-21 ENCOUNTER — Ambulatory Visit (INDEPENDENT_AMBULATORY_CARE_PROVIDER_SITE_OTHER): Payer: Medicare Other

## 2019-01-21 ENCOUNTER — Other Ambulatory Visit: Payer: Self-pay

## 2019-01-21 DIAGNOSIS — Z23 Encounter for immunization: Secondary | ICD-10-CM | POA: Diagnosis not present

## 2019-02-04 ENCOUNTER — Ambulatory Visit (INDEPENDENT_AMBULATORY_CARE_PROVIDER_SITE_OTHER): Payer: Medicare Other

## 2019-02-04 ENCOUNTER — Other Ambulatory Visit: Payer: Self-pay

## 2019-02-04 DIAGNOSIS — Z23 Encounter for immunization: Secondary | ICD-10-CM

## 2019-02-04 DIAGNOSIS — Z299 Encounter for prophylactic measures, unspecified: Secondary | ICD-10-CM

## 2019-02-07 ENCOUNTER — Other Ambulatory Visit: Payer: Self-pay | Admitting: Internal Medicine

## 2019-02-07 DIAGNOSIS — J4520 Mild intermittent asthma, uncomplicated: Secondary | ICD-10-CM

## 2019-02-12 ENCOUNTER — Other Ambulatory Visit: Payer: Self-pay

## 2019-02-12 ENCOUNTER — Ambulatory Visit (INDEPENDENT_AMBULATORY_CARE_PROVIDER_SITE_OTHER): Payer: Medicare Other | Admitting: Pharmacist Clinician (PhC)/ Clinical Pharmacy Specialist

## 2019-02-12 DIAGNOSIS — I236 Thrombosis of atrium, auricular appendage, and ventricle as current complications following acute myocardial infarction: Secondary | ICD-10-CM

## 2019-02-12 DIAGNOSIS — Z7901 Long term (current) use of anticoagulants: Secondary | ICD-10-CM | POA: Diagnosis not present

## 2019-02-12 LAB — POCT INR: INR: 2.7 (ref 2.0–3.0)

## 2019-02-23 ENCOUNTER — Telehealth: Payer: Self-pay | Admitting: Cardiology

## 2019-02-23 NOTE — Telephone Encounter (Signed)
Received a call from patient he stated he woke up around 4 am this morning sob,headache.No chest pain. Stated he is no longer sob,but he feels tired.Stated he wanted to be tested for covid since he will be leaving for [place] in a couple of days.Advised to call PCP.

## 2019-03-06 ENCOUNTER — Other Ambulatory Visit: Payer: Self-pay | Admitting: Internal Medicine

## 2019-03-29 ENCOUNTER — Encounter: Payer: Self-pay | Admitting: Physician Assistant

## 2019-03-29 ENCOUNTER — Ambulatory Visit: Payer: Medicare PPO | Admitting: Physician Assistant

## 2019-03-29 VITALS — BP 118/80 | HR 73 | Temp 97.3°F | Ht 66.0 in | Wt 184.2 lb

## 2019-03-29 DIAGNOSIS — K5010 Crohn's disease of large intestine without complications: Secondary | ICD-10-CM

## 2019-03-29 NOTE — Patient Instructions (Signed)
If you are age 70 or older, your body mass index should be between 23-30. Your Body mass index is 29.73 kg/m. If this is out of the aforementioned range listed, please consider follow up with your Primary Care Provider.  If you are age 44 or younger, your body mass index should be between 19-25. Your Body mass index is 29.73 kg/m. If this is out of the aformentioned range listed, please consider follow up with your Primary Care Provider.     Due to recent changes in healthcare laws, you may see the results of your imaging and laboratory studies on MyChart before your provider has had a chance to review them.  We understand that in some cases there may be results that are confusing or concerning to you. Not all laboratory results come back in the same time frame and the provider may be waiting for multiple results in order to interpret others.  Please give [place] 48 hours in order for your provider to thoroughly review all the results before contacting the office for clarification of your results.   Thank you for choosing me and [facility]

## 2019-03-29 NOTE — Progress Notes (Signed)
Chief Complaint: Follow-up Crohn's colitis  HPI:    Darryl [name] is a 69 year old Caucasian male with a past medical history of Crohn's colitis for more than 2 decades previously managed on 6-MP, history of GERD, B12 deficiency and history of sessile serrated polyps, known to Dr. Hilarie Fredrickson, who presents to clinic today for follow-up of his Crohn's colitis.    09/29/2018 patient seen in clinic after having been in the hospital with profound anemia thought related to his 6-MP.  At that time, it was discussed that there was a need to avoid 6-MP going forward due to bone marrow suppression.  The patient was started on Humira.  Also noted that he was due for surveillance colonoscopy in July 2021.  He had CMP, hepatitis B testing, hepatitis C testing and QuantiFERON gold.  Also continued on Pantoprazole for reflux.    Today, the patient tells me that he is doing very well with Humira, he is maintaining regular bowel movements with no abdominal pain.  Does tell me that he has had a little flare of his eczema, but "I can deal with this".  Overall is very happy with how it is working.    Denies fever, chills, change in bowel habits, abdominal pain or hematochezia.  Past Medical History:  Diagnosis Date  . Acute upper respiratory infections of unspecified site   . Allergic rhinitis, cause unspecified   . Anal stenosis   . Asthma   . Cataract   . Chronic orthostatic hypotension    Related to borderline hypotension; intolerant of blood pressure medications.  On Midodrine  . Coronary artery disease involving native coronary artery of native heart without angina pectoris     p-mRCA 100% (DES PCI - p-mRCA 100% (DES PCI - Resolute Onyx 3.5 x 26 --> ~3.7 mm)), dRCA ~50% & PAV 40%; pRI 85% (Staged DES PCI  Resolute Onyx 2.25 x 15 - ~2.5 mm). 100% LAD CTO after small D1 with ost-prox 60%. R-L collaterals to LAD seen post PCI.  Marland Kitchen Crohn's colitis ([place])   . Exostosis of unspecified site   . GERD (gastroesophageal reflux  disease)   . History of acute inferior wall MI 06/2018   100% p-mRCA -- DES PCI (complicated by mild cardiogenic shock, partially related to 100% LAD CTO) --> staged PCI-RI  . Hyperlipidemia with target LDL less than 70 07/04/2015   Now s/p MI with MV CAD - optimal LDL < 50  . Hyperplastic colon polyp 02/26/2008  . Hypertension   . Ischemic cardiomyopathy    Initial echo shows EF of 40 to 45% with anterior hypokinesis. Cardiac MRI shows EF of 49% with anterior anteroapical hypokinesis/akinesis and LV thrombus.  . Lateral epicondylitis  of elbow   . Left ventricular thrombus following MI ([place]) 10/10/2018   Cardiac MRI 09/28/2018: Mildly dilated LV.  EF estimated roughly 49%.  Hypokinesis of the mid anterior, anteroseptal and apical anterior wall.  There is also apical thrombus (partially layered) 22 x 17 x 11.  . Leukopenia   . Macrocytic anemia    Related to azathioprine  . Nontraumatic rupture of patellar tendon   . Obesity, unspecified   . Regional enteritis of unspecified site   . Seasonal allergies   . Sessile colonic polyp   . Vitamin B12 deficiency     Past Surgical History:  Procedure Laterality Date  . CARDIAC MRI  09/2018   Mildly dilated LV.  EF estimated roughly 49%.  Hypokinesis of the mid anterior, anteroseptal and apical  anterior wall.  There is also apical thrombus (partially layered) 22 x 17 x 11. -->  50-75% late gadolinium enhancement of the mid anterior, anteroseptal and apical anterior and septal walls suggestive of near full-thickness scar w/ minimal viability (poor recovery prognosis w/ REVASCULARIZATION  . COLONOSCOPY     2016  . CORONARY STENT INTERVENTION N/A 07/24/2018   Procedure: CORONARY STENT INTERVENTION;  Surgeon: Troy Sine, MD;  Location: [facility];  Service: Cardiovascular: Staged PCI pRI 85-90%: Resolute Onyx DES 2.25 x 15 (2.5 mm)  . CORONARY/GRAFT ACUTE MI REVASCULARIZATION N/A 07/23/2018   Procedure: CORONARY/GRAFT ACUTE MI  REVASCULARIZATION;  Surgeon: [name], MD;  Location: [facility];  Service: Cardiovascular;::  p-mRCA 100% (DES PCI - p-mRCA 100% (DES PCI - Resolute Onyx 3.5 x 26 (post-dilated ~3.7 mm)), dRCA ~50% & PAV 40%; pRI 85% (Staged PCI). 100% LAD CTO after small D1 with ost-prox 60%. R-L collaterals to LAD seen post PCI.;   . LEFT HEART CATH N/A 07/24/2018   Procedure: LEFT HEART CATH;  Surgeon: Troy Sine, MD;  Location: [place] [facility];  Service: Cardiovascular;  Laterality: N/A; 85-90% pRCA, 60% ost D1 then 100% CTO of LAD -- staged DES PCI LAD  . LEFT HEART CATH AND CORONARY ANGIOGRAPHY  07/23/2018   Procedure: LEFT HEART CATH;  Surgeon: [name]., MD;  [facility]: [facility]   p-mRCA 100% (DES PCI),dRCA ~50% & PAV 40%; pRI 85% (Staged PCI). 100% LAD CTO after small D1 with ost-prox 60%. R-L collaterals to LAD seen post PCI.;   . RIGHT HEART CATH N/A 07/23/2018   Procedure: RIGHT HEART CATH;  Surgeon: [name], MD;  Location: [facility];  Service: Cardiovascular;  Normal RHC #s, CO/CI normal  . TONSILLECTOMY AND ADENOIDECTOMY    . TRANSTHORACIC ECHOCARDIOGRAM  07/23/2018   Insetting of inferior MI with occluded LAD CTO; --Severe akinesis of the mid anteroseptal wall.  EF 40 and 45%.  GR 1 DD.;;      Current Outpatient Medications  Medication Sig Dispense Refill  . albuterol (VENTOLIN HFA) 108 (90 Base) MCG/ACT inhaler Inhale 2 puffs into the lungs every 6 (six) hours as needed for wheezing. 18 g 3  . cholecalciferol (VITAMIN D3) 25 MCG (1000 UT) tablet Take 1,000 Units by mouth daily.    . clopidogrel (PLAVIX) 75 MG tablet Take 1 tablet (75 mg total) by mouth daily. 90 tablet 3  . fluticasone-salmeterol (ADVAIR HFA) 115-21 MCG/ACT inhaler Inhale 2 puffs into the lungs 2 (two) times daily. 12 g 5  . folic acid (FOLVITE) 1 MG tablet TAKE 1 TABLET ONCE DAILY. (Patient taking differently: Take 1 mg by mouth daily. ) 90 tablet 1  . hydrocortisone 2.5 %  cream Apply 1 application topically daily.     Marland Kitchen loperamide (IMODIUM A-D) 2 MG tablet Take 2 mg by mouth 4 (four) times daily as needed for diarrhea or loose stools.    . metoprolol succinate (TOPROL XL) 25 MG 24 hr tablet Take 1 tablet (25 mg total) by mouth daily. 90 tablet 3  . midodrine (PROAMATINE) 2.5 MG tablet TAKE 1 TAB IN AM. MAY TAKE AN ADDITIONAL TAB AFTER 6HR IF STILL DIZZY/SYSTOLIC XK<48 40 tablet 1  . montelukast (SINGULAIR) 10 MG tablet Take 1 tablet (10 mg total) by mouth at bedtime. 90 tablet 1  . Multiple Vitamins-Minerals (CENTRUM SILVER PO) Take 1 tablet by mouth daily.    . nitroGLYCERIN (NITROSTAT)  0.4 MG SL tablet Place 1 tablet (0.4 mg total) under the tongue every 5 (five) minutes x 3 doses as needed for chest pain. 25 tablet 3  . Omega-3 Fatty Acids (DIALYVITE OMEGA-3 CONCENTRATE) 600 MG CAPS Take 1 tablet by mouth 2 (two) times daily.    . pantoprazole (PROTONIX) 40 MG tablet TAKE 1 TABLET ONCE DAILY. 90 tablet 0  . rosuvastatin (CRESTOR) 40 MG tablet Take 1 tablet (40 mg total) by mouth daily. 90 tablet 3  . telmisartan (MICARDIS) 40 MG tablet TAKE 1 TABLET ONCE DAILY. 90 tablet 1  . traMADol (ULTRAM) 50 MG tablet TAKE ONE TABLET EVERY 6 HOURS AS NEEDED. (Patient taking differently: Take 50 mg by mouth every 6 (six) hours as needed (pain). ) 30 tablet 0  . vitamin B-12 (CYANOCOBALAMIN) 1000 MCG tablet Take 1 tablet (1,000 mcg total) by mouth daily.    Marland Kitchen warfarin (COUMADIN) 5 MG tablet Take 1 to 1.5 tablets by mouth daily as directed by coumadin clinic 120 tablet 1   No current facility-administered medications for this visit.    Allergies as of 03/29/2019 - Review Complete 12/24/2018  Allergen Reaction Noted  . Allopurinol Other (See Comments) 10/05/2018  . Asacol [mesalamine] Hives 11/20/2011  . Mercaptopurine Other (See Comments) 10/05/2018  . Amoxicillin Rash 02/07/2005  . Penicillins Rash 07/23/2018  . Sulfamethoxazole Rash 02/07/2005    Family History    Problem Relation Age of Onset  . Diabetes Brother   . Diabetes Father   . Heart attack Father   . Hypertension Father   . Heart disease Father   . Colon cancer Neg Hx   . Stomach cancer Neg Hx   . Hyperlipidemia Neg Hx   . Sudden death Neg Hx   . Cancer Neg Hx   . COPD Neg Hx   . Alcohol abuse Neg Hx   . Drug abuse Neg Hx   . Stroke Neg Hx   . Rectal cancer Neg Hx   . Esophageal cancer Neg Hx     Social History   Socioeconomic History  . Marital status: Married    Spouse name: Not on file  . Number of children: Not on file  . Years of education: Not on file  . Highest education level: Not on file  Occupational History  . Occupation: Optometrist  Tobacco Use  . Smoking status: Never Smoker  . Smokeless tobacco: Never Used  Substance and Sexual Activity  . Alcohol use: Yes    Alcohol/week: 0.0 standard drinks    Comment: rare  . Drug use: No  . Sexual activity: Yes  Other Topics Concern  . Not on file  Social History Narrative  . Not on file   Social Determinants of Health   Financial Resource Strain:   . Difficulty of Paying Living Expenses: Not on file  Food Insecurity:   . Worried About Charity fundraiser in the Last Year: Not on file  . Ran Out of Food in the Last Year: Not on file  Transportation Needs:   . Lack of Transportation (Medical): Not on file  . Lack of Transportation (Non-Medical): Not on file  Physical Activity:   . Days of Exercise per Week: Not on file  . Minutes of Exercise per Session: Not on file  Stress:   . Feeling of Stress : Not on file  Social Connections:   . Frequency of Communication with Friends and Family: Not on file  . Frequency of Social Gatherings with  Friends and Family: Not on file  . Attends Religious Services: Not on file  . Active Member of Clubs or Organizations: Not on file  . Attends Archivist Meetings: Not on file  . Marital Status: Not on file  Intimate Partner Violence:   . Fear of Current or  Ex-Partner: Not on file  . Emotionally Abused: Not on file  . Physically Abused: Not on file  . Sexually Abused: Not on file    Review of Systems:    Constitutional: No weight loss, fever or chills Cardiovascular: No chest pain Respiratory: No SOB  Gastrointestinal: See HPI and otherwise negative   Physical Exam:  Vital signs: BP 118/80   Pulse 73   Temp (!) 97.3 F (36.3 C)   Ht 5' 6"  (1.676 m)   Wt 184 lb 3.2 oz (83.6 kg)   SpO2 97%   BMI 29.73 kg/m    Constitutional:   Pleasant Caucasian male appears to be in NAD, Well developed, Well nourished, alert and cooperative Respiratory: Respirations even and unlabored. Lungs clear to auscultation bilaterally.   No wheezes, crackles, or rhonchi.  Cardiovascular: Normal S1, S2. No MRG. Regular rate and rhythm. No peripheral edema, cyanosis or pallor.  Gastrointestinal:  Soft, nondistended, nontender. No rebound or guarding. Normal bowel sounds. No appreciable masses or hepatomegaly. Rectal:  Not performed.  Psychiatric: Demonstrates good judgement and reason without abnormal affect or behaviors.  MOST RECENT LABS AND IMAGING: CBC    Component Value Date/Time   WBC 5.4 01/04/2019 1015   RBC 3.96 (L) 01/04/2019 1015   HGB 14.4 01/04/2019 1015   HCT 42.4 01/04/2019 1015   PLT 218.0 01/04/2019 1015   MCV 107.1 (H) 01/04/2019 1015   MCH 33.9 09/23/2018 0230   MCHC 33.9 01/04/2019 1015   RDW 13.8 01/04/2019 1015   LYMPHSABS 1.7 01/04/2019 1015   MONOABS 0.7 01/04/2019 1015   EOSABS 0.0 01/04/2019 1015   BASOSABS 0.0 01/04/2019 1015    CMP     Component Value Date/Time   NA 140 09/29/2018 1600   K 4.0 09/29/2018 1600   CL 107 09/29/2018 1600   CO2 24 09/29/2018 1600   GLUCOSE 90 09/29/2018 1600   BUN 16 09/29/2018 1600   CREATININE 1.19 09/29/2018 1600   CALCIUM 8.9 09/29/2018 1600   PROT 7.0 09/29/2018 1600   ALBUMIN 4.6 09/29/2018 1600   AST 20 09/29/2018 1600   ALT 25 09/29/2018 1600   ALKPHOS 82 09/29/2018 1600    BILITOT 0.8 09/29/2018 1600   GFRNONAA >60 09/22/2018 0707   GFRAA >60 09/22/2018 0707    Assessment: 1.  Crohn's colitis: Longstanding with a flare earlier in 2020 responding to budesonide/Uceris, issues with pancytopenia due to 6-MP therapy, started on Humira which is doing well 2.  GERD: Continue Pantoprazole  Plan: 1.  Continue Humira at current dose.  Will give prior authorization paperwork to Dr. Vena Rua nurse Vaughan Basta to complete for the patient. 2.  Patient is due for his next colonoscopy in July 2021.  He is already in recall for this. 3.  Patient to follow in clinic with Dr. Hilarie Fredrickson for his colonoscopy or sooner if necessary.  Ellouise Newer, PA-C [facility] 03/29/2019, 1:17 PM  Cc: Janith Lima, MD

## 2019-04-02 ENCOUNTER — Ambulatory Visit (INDEPENDENT_AMBULATORY_CARE_PROVIDER_SITE_OTHER): Payer: Medicare PPO | Admitting: Pharmacist Clinician (PhC)/ Clinical Pharmacy Specialist

## 2019-04-02 ENCOUNTER — Other Ambulatory Visit: Payer: Self-pay

## 2019-04-02 DIAGNOSIS — Z7901 Long term (current) use of anticoagulants: Secondary | ICD-10-CM

## 2019-04-02 DIAGNOSIS — I236 Thrombosis of atrium, auricular appendage, and ventricle as current complications following acute myocardial infarction: Secondary | ICD-10-CM | POA: Diagnosis not present

## 2019-04-02 LAB — POCT INR: INR: 2.8 (ref 2.0–3.0)

## 2019-04-02 NOTE — Progress Notes (Signed)
Addendum: Reviewed and agree with assessment and management plan. Shalyn Koral M, MD  

## 2019-04-06 ENCOUNTER — Ambulatory Visit (HOSPITAL_COMMUNITY): Payer: Medicare PPO | Attending: Cardiology

## 2019-04-06 ENCOUNTER — Other Ambulatory Visit: Payer: Self-pay

## 2019-04-06 DIAGNOSIS — I236 Thrombosis of atrium, auricular appendage, and ventricle as current complications following acute myocardial infarction: Secondary | ICD-10-CM | POA: Diagnosis not present

## 2019-04-06 DIAGNOSIS — I2510 Atherosclerotic heart disease of native coronary artery without angina pectoris: Secondary | ICD-10-CM | POA: Diagnosis not present

## 2019-04-06 DIAGNOSIS — Z9861 Coronary angioplasty status: Secondary | ICD-10-CM

## 2019-04-06 HISTORY — PX: TRANSTHORACIC ECHOCARDIOGRAM: SHX275

## 2019-04-06 MED ORDER — PERFLUTREN LIPID MICROSPHERE
1.0000 mL | INTRAVENOUS | Status: AC | PRN
  Administered 2019-04-06: 2 mL via INTRAVENOUS

## 2019-04-07 ENCOUNTER — Ambulatory Visit: Payer: Self-pay | Admitting: Pharmacist Clinician (PhC)/ Clinical Pharmacy Specialist

## 2019-04-07 DIAGNOSIS — I236 Thrombosis of atrium, auricular appendage, and ventricle as current complications following acute myocardial infarction: Secondary | ICD-10-CM

## 2019-04-07 DIAGNOSIS — Z7901 Long term (current) use of anticoagulants: Secondary | ICD-10-CM

## 2019-04-21 ENCOUNTER — Telehealth: Payer: Self-pay | Admitting: *Deleted

## 2019-04-21 NOTE — Telephone Encounter (Signed)
Left voice mail to call back to go over medication and symptoms.  Dr. Ellyn Hack, Does patient needs to hold Plavix for procedure? Seems you stopped coumadin based on recent echo. Please forward your response to [facility] PREOP.   Thank you

## 2019-04-21 NOTE — Telephone Encounter (Addendum)
Continue warfarin for cleaning and crown placement. Routing to [facility] where pt is followed for clarification - most recent note states "per harding, echo 04-06-19" and therapy completed, not sure if pt is still on warfarin.

## 2019-04-21 NOTE — Telephone Encounter (Signed)
   [facility] Pre-operative Risk Assessment    Request for surgical clearance:  1. What type of surgery is being performed? CLEANING AND LATER A CROWN  2. When is this surgery scheduled? 04/29/19   CLEANING AND CROWN LATER IN MONTH  3. What type of clearance is required (medical clearance vs. Pharmacy clearance to hold med vs. Both)? BOTH  4. Are there any medications that need to be held prior to surgery and how long? COUMADIN AND PLAVIX  5. Practice name and name of physician performing surgery?  JERRY W REEVES DDS PA   6. What is your office phone number [phone]    7.   What is your office fax number [phone]  8.   Anesthesia type (None, local, MAC, general) ? CHOICE   Devra Dopp 04/21/2019, 2:00 PM  _________________________________________________________________   (provider comments below)

## 2019-04-21 NOTE — Telephone Encounter (Signed)
As in my last note, the plan was for Plavix to be uninterrupted until April 2021.  If the cleaning and crown placement is of vital importance, he can probably hold Plavix 5-7 days preop, but if this is elective, would probably be better to wait till April.  Would not need to hold it for cleaning however.  The warfarin was stopped because the LV thrombus appears to dissipated.  Glenetta Hew, MD

## 2019-04-22 NOTE — Telephone Encounter (Signed)
   I will route this recommendation to the requesting party via Epic fax function and remove from pre-op pool.  Please call with questions.  [place], [place] 04/22/2019, 8:33 AM

## 2019-04-23 ENCOUNTER — Other Ambulatory Visit: Payer: Self-pay | Admitting: Internal Medicine

## 2019-05-02 ENCOUNTER — Ambulatory Visit: Payer: Medicare PPO

## 2019-05-18 ENCOUNTER — Ambulatory Visit: Payer: Medicare PPO

## 2019-05-24 ENCOUNTER — Other Ambulatory Visit: Payer: Self-pay

## 2019-05-24 ENCOUNTER — Ambulatory Visit: Payer: Medicare PPO | Admitting: Cardiology

## 2019-05-24 ENCOUNTER — Encounter: Payer: Self-pay | Admitting: Cardiology

## 2019-05-24 VITALS — BP 137/87 | HR 63 | Ht 66.5 in | Wt 181.4 lb

## 2019-05-24 DIAGNOSIS — E785 Hyperlipidemia, unspecified: Secondary | ICD-10-CM | POA: Diagnosis not present

## 2019-05-24 DIAGNOSIS — Z9861 Coronary angioplasty status: Secondary | ICD-10-CM | POA: Diagnosis not present

## 2019-05-24 DIAGNOSIS — I255 Ischemic cardiomyopathy: Secondary | ICD-10-CM

## 2019-05-24 DIAGNOSIS — I2510 Atherosclerotic heart disease of native coronary artery without angina pectoris: Secondary | ICD-10-CM | POA: Diagnosis not present

## 2019-05-24 DIAGNOSIS — E118 Type 2 diabetes mellitus with unspecified complications: Secondary | ICD-10-CM | POA: Diagnosis not present

## 2019-05-24 DIAGNOSIS — I10 Essential (primary) hypertension: Secondary | ICD-10-CM | POA: Diagnosis not present

## 2019-05-24 DIAGNOSIS — I951 Orthostatic hypotension: Secondary | ICD-10-CM | POA: Diagnosis not present

## 2019-05-24 LAB — BASIC METABOLIC PANEL
BUN/Creatinine Ratio: 17 (ref 10–24)
BUN: 18 mg/dL (ref 8–27)
CO2: 22 mmol/L (ref 20–29)
Calcium: 9.6 mg/dL (ref 8.6–10.2)
Chloride: 102 mmol/L (ref 96–106)
Creatinine, Ser: 1.08 mg/dL (ref 0.76–1.27)
GFR calc Af Amer: 81 mL/min/{1.73_m2} (ref >59)
GFR calc non Af Amer: 70 mL/min/{1.73_m2} (ref >59)
Glucose: 98 mg/dL (ref 65–99)
Potassium: 4.7 mmol/L (ref 3.5–5.2)
Sodium: 138 mmol/L (ref 134–144)

## 2019-05-24 MED ORDER — LOSARTAN POTASSIUM 25 MG PO TABS: 25.0000 mg | ORAL_TABLET | Freq: Every day | ORAL | 3 refills | Status: DC

## 2019-05-24 NOTE — Assessment & Plan Note (Signed)
Multiple vessel PCI.  Thankfully, no longer on warfarin because resolution of LV apical thrombus.  Now on Plavix alone.  Okay to be interrupted for procedures. -> He does have a dental cleaning and crown planned for May 2021.  He would be fine to hold his Plavix for that procedure.

## 2019-05-24 NOTE — Assessment & Plan Note (Signed)
Finally seeming to stabilize out.  I think with all of his activity he is recuperated from tendency to orthostasis.  He is no longer have any use of midodrine. I will try to start low-dose ARB, but he may require.  Midodrine.  If he does have to use midodrine in the morning he will simply hold his ARB dose.

## 2019-05-24 NOTE — Progress Notes (Signed)
PCP: Darryl Lima, MD  Clinic Note: Chief Complaint  Patient presents with  . Follow-up    6 months  . Coronary Artery Disease    No angina  . Cardiomyopathy    No heart failure    HPI: Darryl [name] is a 69 y.o. male with a PMH notable for recent inferior STEMI found to have CTO LAD (had PCI of occluded RCA followed by staged PCI of Ramus [employer]medi[employer]) with ischemic cardiomyopathy below who presents today for 55-monthfollow-up  --> He has been limited as far as medication medical management for his cardiomyopathy based on him having orthostatic hypotension requiring Midodrine  Admitted from 6/29-7/1 2020 for anemia with hemoglobin down to 5.0.  Had no evidence of melena, hematochezia or hematuria.  FOBT was negative.  Noted to have macrocytic anemia, treated with 4 units PRBC. -->  Was thought to be related to Crohn's medications (azathioprine) --> this was not thought to be related to a GI bleed.  Therefore was placed back on Brilinta but no aspirin.  Midrin continued for orthostatic hypotension.  Darryl CONGROVEwas last seen by me in on December 22, 2018 to follow-up for cardiac MRI.-Noted improved EF but left ventricular apical thrombus noted.  Was back to his routine level of exercise.  Was still having short little bursts of nonsustained VT.  No PND, orthopnea or edema.  Abdominal pain controlled from Crohn's.  Was taking morning dose of midodrine. -> Started on Toprol 25 mg daily, decided to hold on ARB.  Continued clopidogrel and rosuvastatin.  Echo ordered.  Recent Hospitalizations:   Studies Personally Reviewed - (if available, images/films reviewed: From Epic Chart or Care Everywhere)  04/06/2019:Echo: EF 40 and 45%.  Distal septal and apical akinesis.  No LV thrombus noted.  Mild LVH.  GR 1 DD.  Normal RV.  Mild LA dilation.  Normal RA.  Mild aortic valve sclerosis with no stenosis - mild AI    Stopped Coumadin -- On Plavix   Interval History: Darryl [name] today  for 6 month f/u doing great.  He is very active now he goes to the gym about 2 to 3 days a week and does stretching exercises as well as some strength and conditioning exercises.  He walks just about every day for at least a mile or 2 a day.  He plays golf at least once or twice a week but sometimes more frequently.  As weather improves, he plans to get back on his bicycle both his inside bike and outside bike.  He has not had any further chest pain or pressure with rest or exertion.  Although he has had PVCs in the past, he is not noting any palpitations or rapid irregular heartbeats.  No heart failure symptoms despite having "cardiomyopathy ".  No claudication.  His GI doctor switched him over to Humira for his Crohn's, and things seem to be totally stable now.  No melena, hematochezia, hematuria, epistaxis.  As for his dizziness and orthostatic hypotension, he has not had to use midodrine but may be 3 times since I last saw him.  Seems to be tolerating current dose of metoprolol well.  Cardiovascular Review of Symptoms: no chest pain or dyspnea on exertion negative for - edema, irregular heartbeat, orthopnea, palpitations, paroxysmal nocturnal dyspnea, rapid heart rate, shortness of breath or Syncope/near syncope, TIA/amaurosis fugax, claudication  The patient DOES NOT have symptoms concerning for COVID-19 infection (fever, chills, cough, or new shortness of breath).  The patient is practicing social distancing.  ROS: A comprehensive was performed. Review of Systems  Constitutional: Negative for malaise/fatigue and weight loss.  HENT: Negative for congestion and nosebleeds.   Respiratory: Negative for cough and shortness of breath.   Cardiovascular: Negative for claudication and leg swelling.  Gastrointestinal: Negative for abdominal pain (none since Humira), blood in stool, constipation and diarrhea.  Musculoskeletal: Negative for falls, joint pain and myalgias.  Neurological: Negative for  dizziness (Stable taking a.m. dose of midodrine), focal weakness and weakness.       Occasionally light headed in AM  Psychiatric/Behavioral: Negative for depression. The patient has insomnia (trouble getting to sleep - then wakes up -hard to get back to sleep (mind racing)). The patient is not nervous/anxious.    I have reviewed and (if needed) personally updated the patient's problem list, medications, allergies, past medical and surgical history, social and family history.   Past Medical History:  Diagnosis Date  . Acute upper respiratory infections of unspecified site   . Allergic rhinitis, cause unspecified   . Anal stenosis   . Asthma   . Cataract   . Chronic orthostatic hypotension    Related to borderline hypotension; intolerant of blood pressure medications.  On Midodrine  . Coronary artery disease involving native coronary artery of native heart without angina pectoris 06/2018    p-mRCA 100% (DES PCI - p-mRCA 100% (DES PCI - Resolute Onyx 3.5 x 26 --> ~3.7 mm)), dRCA ~50% & PAV 40%; pRI 85% (Staged DES PCI  Resolute Onyx 2.25 x 15 - ~2.5 mm). 100% LAD CTO after small D1 with ost-prox 60%. R-L collaterals to LAD seen post PCI.  Marland Kitchen Crohn's colitis (Darryl [name])   . Exostosis of unspecified site   . GERD (gastroesophageal reflux disease)   . History of acute inferior wall MI 06/2018   100% p-mRCA -- DES PCI (complicated by mild cardiogenic shock, partially related to 100% LAD CTO) --> staged PCI-RI  . Hyperlipidemia with target LDL less than 70 07/04/2015   Now s/p MI with MV CAD - optimal LDL < 50  . Hyperplastic colon polyp 02/26/2008  . Hypertension   . Ischemic cardiomyopathy    Initial echo shows EF of 40 to 45% with anterior hypokinesis. Cardiac MRI shows EF of 49% with anterior anteroapical hypokinesis/akinesis and LV thrombus.  . Lateral epicondylitis  of elbow   . Left ventricular thrombus following MI (Darryl [name]) 10/10/2018   Cardiac MRI 09/28/2018: Mildly dilated LV.  EF estimated  roughly 49%.  Hypokinesis of the mid anterior, anteroseptal and apical anterior wall.  There is also apical thrombus (partially layered) 22 x 17 x 11.  . Leukopenia   . Macrocytic anemia    Related to azathioprine  . Nontraumatic rupture of patellar tendon   . Obesity, unspecified   . Regional enteritis of unspecified site   . Seasonal allergies   . Sessile colonic polyp   . Vitamin B12 deficiency     Past Surgical History:  Procedure Laterality Date  . CARDIAC MRI  09/2018   Mildly dilated LV.  EF estimated roughly 49%.  Hypokinesis of the mid anterior, anteroseptal and apical anterior wall.  There is also apical thrombus (partially layered) 22 x 17 x 11. -->  50-75% late gadolinium enhancement of the mid anterior, anteroseptal and apical anterior and septal walls suggestive of near full-thickness scar w/ minimal viability (poor recovery prognosis w/ REVASCULARIZATION  . COLONOSCOPY     2016  . CORONARY STENT INTERVENTION  N/A 07/24/2018   Procedure: CORONARY STENT INTERVENTION;  Surgeon: Troy Sine, MD;  Location: [facility];  Service: Cardiovascular: Staged PCI pRI 85-90%: Resolute Onyx DES 2.25 x 15 (2.5 mm)  . CORONARY/GRAFT ACUTE MI REVASCULARIZATION N/A 07/23/2018   Procedure: CORONARY/GRAFT ACUTE MI REVASCULARIZATION;  Surgeon: [name], MD;  Location: [facility];  Service: Cardiovascular;::  p-mRCA 100% (DES PCI - p-mRCA 100% (DES PCI - Resolute Onyx 3.5 x 26 (post-dilated ~3.7 mm)), dRCA ~50% & PAV 40%; pRI 85% (Staged PCI). 100% LAD CTO after small D1 with ost-prox 60%. R-L collaterals to LAD seen post PCI.;   . LEFT HEART CATH N/A 07/24/2018   Procedure: LEFT HEART CATH;  Surgeon: Troy Sine, MD;  Location: [facility];  Service: Cardiovascular;  Laterality: N/A; 85-90% pRCA, 60% ost D1 then 100% CTO of LAD -- staged DES PCI LAD  . LEFT HEART CATH AND CORONARY ANGIOGRAPHY  07/23/2018   Procedure: LEFT HEART CATH;  Surgeon: [name]., MD;   Location: [facility]   p-mRCA 100% (DES PCI),dRCA ~50% & PAV 40%; pRI 85% (Staged PCI). 100% LAD CTO after small D1 with ost-prox 60%. R-L collaterals to LAD seen post PCI.;   . RIGHT HEART CATH N/A 07/23/2018   Procedure: RIGHT HEART CATH;  Surgeon: [name], MD;  Location: [facility];  Service: Cardiovascular;  Normal RHC #s, CO/CI normal  . TONSILLECTOMY AND ADENOIDECTOMY    . TRANSTHORACIC ECHOCARDIOGRAM  07/23/2018   Insetting of inferior MI with occluded LAD CTO; --Severe akinesis of the mid anteroseptal wall.  EF 40 and 45%.  GR 1 DD.;;     Diagnostic #1: 07/23/2018 Dominance: Right   After staged PCI -07/24/2018    Current Meds  Medication Sig  . albuterol (VENTOLIN HFA) 108 (90 Base) MCG/ACT inhaler Inhale 2 puffs into the lungs every 6 (six) hours as needed for wheezing.  . cholecalciferol (VITAMIN D3) 25 MCG (1000 UT) tablet Take 1,000 Units by mouth daily.  . clopidogrel (PLAVIX) 75 MG tablet Take 1 tablet (75 mg total) by mouth daily.  . fluticasone-salmeterol (ADVAIR HFA) 115-21 MCG/ACT inhaler Inhale 2 puffs into the lungs 2 (two) times daily.  . folic acid (FOLVITE) 1 MG tablet TAKE 1 TABLET ONCE DAILY. (Patient taking differently: Take 1 mg by mouth daily. )  . HUMIRA PEN 40 MG/0.4ML PNKT every 14 (fourteen) days.   . hydrocortisone 2.5 % cream Apply 1 application topically daily.   Marland Kitchen loperamide (IMODIUM A-D) 2 MG tablet Take 2 mg by mouth 4 (four) times daily as needed for diarrhea or loose stools.  . metoprolol succinate (TOPROL XL) 25 MG 24 hr tablet Take 1 tablet (25 mg total) by mouth daily.  . midodrine (PROAMATINE) 2.5 MG tablet TAKE 1 TAB IN AM. MAY TAKE AN ADDITIONAL TAB AFTER 6HR IF STILL DIZZY/SYSTOLIC XH<37  . montelukast (SINGULAIR) 10 MG tablet TAKE ONE TABLET AT BEDTIME.  . Multiple Vitamins-Minerals (CENTRUM SILVER PO) Take 1 tablet by mouth daily.  . nitroGLYCERIN (NITROSTAT) 0.4 MG SL tablet Place 1 tablet (0.4 mg total) under the  tongue every 5 (five) minutes x 3 doses as needed for chest pain.  . Omega-3 Fatty Acids (DIALYVITE OMEGA-3 CONCENTRATE) 600 MG CAPS Take 1 tablet by mouth 2 (two) times daily.  . pantoprazole (PROTONIX) 40 MG tablet TAKE 1 TABLET ONCE DAILY.  . rosuvastatin (CRESTOR) 40 MG tablet Take 1 tablet (40 mg total)  by mouth daily.  . traMADol (ULTRAM) 50 MG tablet TAKE ONE TABLET EVERY 6 HOURS AS NEEDED.  Marland Kitchen vitamin B-12 (CYANOCOBALAMIN) 1000 MCG tablet Take 1 tablet (1,000 mcg total) by mouth daily.  Marland Kitchen warfarin (COUMADIN) 5 MG tablet Take 1 to 1.5 tablets by mouth daily as directed by coumadin clinic    Allergies  Allergen Reactions  . Allopurinol Other (See Comments)    Bone marrow suppression  . Asacol [Mesalamine] Hives    Trouble swallowing, loss of appetite  . Mercaptopurine Other (See Comments)    Bone marrow suppression  . Amoxicillin Rash    Did it involve swelling of the face/tongue/throat, SOB, or low BP? Unknown Did it involve sudden or severe rash/hives, skin peeling, or any reaction on the inside of your mouth or nose? Unknown Did you need to seek medical attention at a hospital or doctor's office? Unknown When did it last happen?b/c of penicillin allergy If all above answers are "NO", may proceed with cephalosporin use.   Marland Kitchen Penicillins Rash    Did it involve swelling of the face/tongue/throat, SOB, or low BP? Yes Did it involve sudden or severe rash/hives, skin peeling, or any reaction on the inside of your mouth or nose? No Did you need to seek medical attention at a hospital or doctor's office? No When did it last happen?more than 15 yrs If all above answers are "NO", may proceed with cephalosporin use.   . Sulfamethoxazole Rash    Social History   Tobacco Use  . Smoking status: Never Smoker  . Smokeless tobacco: Never Used  Substance Use Topics  . Alcohol use: Yes    Alcohol/week: 0.0 standard drinks    Comment: rare  . Drug use: No   Social History     Social History Narrative  . Not on file    family history includes Diabetes in his brother and father; Heart attack in his father; Heart disease in his father; Hypertension in his father.  Wt Readings from Last 3 Encounters:  05/24/19 181 lb 6.4 oz (82.3 kg)  03/29/19 184 lb 3.2 oz (83.6 kg)  12/22/18 176 lb 3.2 oz (79.9 kg)    PHYSICAL EXAM BP 137/87   Pulse 63   Ht 5' 6.5" (1.689 m)   Wt 181 lb 6.4 oz (82.3 kg)   SpO2 97%   BMI 28.84 kg/m  Physical Exam  Constitutional: He is oriented to person, place, and time. He appears well-developed and well-nourished.  Healthy-appearing gentleman.  No acute distress.  Well-groomed  HENT:  Head: Normocephalic and atraumatic.  Neck: No hepatojugular reflux and no JVD present. Carotid bruit is not present. No thyromegaly present.  Cardiovascular: Normal rate, regular rhythm, normal heart sounds, intact distal pulses and normal pulses.  No extrasystoles are present. PMI is not displaced. Exam reveals no gallop and no friction rub.  No murmur heard. Pulmonary/Chest: Effort normal and breath sounds normal. No respiratory distress. He has no wheezes. He has no rales.  Abdominal: Soft. Bowel sounds are normal. He exhibits no distension. There is no abdominal tenderness.  Musculoskeletal:        General: No edema. Normal range of motion.     Cervical back: Normal range of motion and neck supple.  Neurological: He is alert and oriented to person, place, and time.  Psychiatric: His behavior is normal. Judgment and thought content normal.  Vitals reviewed.   Adult ECG Report Not checked  Other studies Reviewed: Additional studies/ records that were reviewed  today include:  Recent Labs:   Lab Results  Component Value Date   HGBA1C 5.9 10/01/2018    CBC Latest Ref Rng & Units 01/04/2019 10/28/2018 09/29/2018  WBC 4.0 - 10.5 K/uL 5.4 5.8 2.8(L)  Hemoglobin 13.0 - 17.0 g/dL 14.4 12.3(L) 10.2(L)  Hematocrit 39.0 - 52.0 % 42.4 36.1(L) 28.9(L)   Platelets 150.0 - 400.0 K/uL 218.0 181.0 228.0   Chemistry check today Lab Results  Component Value Date   CREATININE 1.08 05/24/2019   BUN 18 05/24/2019   NA 138 05/24/2019   K 4.7 05/24/2019   CL 102 05/24/2019   CO2 22 05/24/2019   Lab Results  Component Value Date   CHOL 117 07/24/2018   HDL 64 07/24/2018   LDLCALC 39 07/24/2018   TRIG 72 07/24/2018   CHOLHDL 1.8 07/24/2018  --Due to have labs checked probably May 2021 by PCP.   ASSESSMENT / PLAN: Problem List Items Addressed This Visit    Coronary artery disease involving native coronary artery of native heart without angina pectoris - Primary (Chronic)    Multivessel CAD with total occluded LAD and PCI to totally occluded RCA that basely collateralizes most the LAD and then staged PCI to the circumflex.  Thankfully no further angina symptoms.  His EF notably improved after PCI.  Currently able to tolerate low-dose beta-blocker and high-dose statin along with Plavix alone now that he is off of warfarin.  Plan: Continue current dose of Toprol as well as rosuvastatin and clopidogrel, but we will try to start low-dose ARB-losartan 25 mg daily.      Relevant Medications   losartan (COZAAR) 25 MG tablet   Other Relevant Orders   EKG [id number]   Basic metabolic panel (Completed)   Basic metabolic panel   CAD S/P percutaneous coronary angioplasty (Chronic)    Multiple vessel PCI.  Thankfully, no longer on warfarin because resolution of LV apical thrombus.  Now on Plavix alone.  Okay to be interrupted for procedures. -> He does have a dental cleaning and crown planned for May 2021.  He would be fine to hold his Plavix for that procedure.      Relevant Medications   losartan (COZAAR) 25 MG tablet   Other Relevant Orders   EKG 78-[id number]   Basic metabolic panel (Completed)   Basic metabolic panel   Essential hypertension, benign (Chronic)    At this point, he seems to be doing well on current dose of Toprol and not  requiring midodrine.  Since the symptoms are now resolved, I will add low-dose ARB.  Check baseline chemistry today.  Then follow-up in 3 weeks.      Relevant Medications   losartan (COZAAR) 25 MG tablet   Hyperlipidemia with target LDL less than 70 (Chronic)    Outstanding labs as of May 2020 with LDL 39 on current dose of rosuvastatin. Should be due for labs to be rechecked in May of this year.  For now continue current doses as long as he is doing well.  I congratulated his efforts on diet and exercise changes as well.  Hopefully his lipids are looking better follow-up.      Relevant Medications   losartan (COZAAR) 25 MG tablet   Ischemic cardiomyopathy (Chronic)    Follow-up echo showed stable EF of 40 to 45% which is a notable improvement from his initial MI EF.  Clearly there is a big anterior wall defect that goes along with occluded LAD, probably not reasonable to consider revascularization at  this point.  Thankfully though he is not low enough to consider ICD. Plan: Continue Toprol and add ARB.  (Losartan 25 mg daily) Euvolemic, not requiring any diuretic.      Relevant Medications   losartan (COZAAR) 25 MG tablet   Other Relevant Orders   EKG 76-AUQJ   Basic metabolic panel (Completed)   Basic metabolic panel   Type II diabetes mellitus with manifestations (HCC) (Chronic)    Borderline A1c in the past, will be followed by PCP.  Add ARB.      Relevant Medications   losartan (COZAAR) 25 MG tablet   Orthostatic hypotension    Finally seeming to stabilize out.  I think with all of his activity he is recuperated from tendency to orthostasis.  He is no longer have any use of midodrine. I will try to start low-dose ARB, but he may require.  Midodrine.  If he does have to use midodrine in the morning he will simply hold his ARB dose.      Relevant Medications   losartan (COZAAR) 25 MG tablet      COVID-19 Education: The signs and symptoms of COVID-19 were discussed with  the patient and how to seek care for testing (follow up with PCP or arrange E-visit).   The importance of social distancing was discussed today.  I spent a total of 20 minutes with the patient and chart review. >  50% of the time was spent in direct patient consultation.   Current medicines are reviewed at length with the patient today.  (+/- concerns) ready to discuss warfarin The following changes have been made:  See below  Patient Instructions  Medication Instructions:   Start  Losartan 25 mg  One tablet daily   *If you need a refill on your cardiac medications before your next appointment, please call your pharmacy*   Lab Work: BMP- today   BMP- one month   If you have labs (blood work) drawn today and your tests are completely normal, you will receive your results only by: Marland Kitchen MyChart Message (if you have MyChart) OR . A paper copy in the mail If you have any lab test that is abnormal or we need to change your treatment, we will call you to review the results.   Testing/Procedures: Not neded   Follow-Up: At [facility], you and your health needs are our priority.  As part of our continuing mission to provide you with exceptional heart care, we have created designated Provider Care Teams.  These Care Teams include your primary Cardiologist (physician) and Advanced Practice Providers (APPs -  Physician Assistants and Nurse Practitioners) who all work together to provide you with the care you need, when you need it.    Your next appointment:   6 month(s)  The format for your next appointment:   In Person  Provider:   Glenetta Hew, MD   Other Instructions  please record your blood pressure over the next month.   Contact the the office with reading.   Studies Ordered:   Orders Placed This Encounter  Procedures  . Basic metabolic panel  . Basic metabolic panel  . EKG 12-Lead      Glenetta Hew, M.D., M.S. Interventional Cardiologist   Pager #  [phone] Phone # [phone] [address] [place], [place] [address]   Thank you for choosing [facility] at [facility]!!

## 2019-05-24 NOTE — Assessment & Plan Note (Signed)
Outstanding labs as of May 2020 with LDL 39 on current dose of rosuvastatin. Should be due for labs to be rechecked in May of this year.  For now continue current doses as long as he is doing well.  I congratulated his efforts on diet and exercise changes as well.  Hopefully his lipids are looking better follow-up.

## 2019-05-24 NOTE — Assessment & Plan Note (Signed)
Multivessel CAD with total occluded LAD and PCI to totally occluded RCA that basely collateralizes most the LAD and then staged PCI to the circumflex.  Thankfully no further angina symptoms.  His EF notably improved after PCI.  Currently able to tolerate low-dose beta-blocker and high-dose statin along with Plavix alone now that he is off of warfarin.  Plan: Continue current dose of Toprol as well as rosuvastatin and clopidogrel, but we will try to start low-dose ARB-losartan 25 mg daily.

## 2019-05-24 NOTE — Assessment & Plan Note (Signed)
Follow-up echo showed stable EF of 40 to 45% which is a notable improvement from his initial MI EF.  Clearly there is a big anterior wall defect that goes along with occluded LAD, probably not reasonable to consider revascularization at this point.  Thankfully though he is not low enough to consider ICD. Plan: Continue Toprol and add ARB.  (Losartan 25 mg daily) Euvolemic, not requiring any diuretic.

## 2019-05-24 NOTE — Assessment & Plan Note (Signed)
Borderline A1c in the past, will be followed by PCP.  Add ARB.

## 2019-05-24 NOTE — Patient Instructions (Signed)
Medication Instructions:   Start  Losartan 25 mg  One tablet daily   *If you need a refill on your cardiac medications before your next appointment, please call your pharmacy*   Lab Work: BMP- today   BMP- one month   If you have labs (blood work) drawn today and your tests are completely normal, you will receive your results only by: Marland Kitchen MyChart Message (if you have MyChart) OR . A paper copy in the mail If you have any lab test that is abnormal or we need to change your treatment, we will call you to review the results.   Testing/Procedures: Not neded   Follow-Up: At [facility], you and your health needs are our priority.  As part of our continuing mission to provide you with exceptional heart care, we have created designated Provider Care Teams.  These Care Teams include your primary Cardiologist (physician) and Advanced Practice Providers (APPs -  Physician Assistants and Nurse Practitioners) who all work together to provide you with the care you need, when you need it.    Your next appointment:   6 month(s)  The format for your next appointment:   In Person  Provider:   Glenetta Hew, MD   Other Instructions  please record your blood pressure over the next month.   Contact the the office with reading.

## 2019-05-24 NOTE — Assessment & Plan Note (Addendum)
At this point, he seems to be doing well on current dose of Toprol and not requiring midodrine.  Since the symptoms are now resolved, I will add low-dose ARB.  Check baseline chemistry today.  Then follow-up in 3 weeks.

## 2019-06-09 ENCOUNTER — Other Ambulatory Visit: Payer: Self-pay | Admitting: Internal Medicine

## 2019-06-24 DIAGNOSIS — I2510 Atherosclerotic heart disease of native coronary artery without angina pectoris: Secondary | ICD-10-CM | POA: Diagnosis not present

## 2019-06-24 DIAGNOSIS — Z9861 Coronary angioplasty status: Secondary | ICD-10-CM | POA: Diagnosis not present

## 2019-06-24 DIAGNOSIS — I255 Ischemic cardiomyopathy: Secondary | ICD-10-CM | POA: Diagnosis not present

## 2019-06-24 LAB — BASIC METABOLIC PANEL
BUN/Creatinine Ratio: 18 (ref 10–24)
BUN: 18 mg/dL (ref 8–27)
CO2: 20 mmol/L (ref 20–29)
Calcium: 9.6 mg/dL (ref 8.6–10.2)
Chloride: 104 mmol/L (ref 96–106)
Creatinine, Ser: 1.01 mg/dL (ref 0.76–1.27)
GFR calc Af Amer: 87 mL/min/{1.73_m2} (ref >59)
GFR calc non Af Amer: 76 mL/min/{1.73_m2} (ref >59)
Glucose: 121 mg/dL — ABNORMAL HIGH (ref 65–99)
Potassium: 4.7 mmol/L (ref 3.5–5.2)
Sodium: 139 mmol/L (ref 134–144)

## 2019-07-18 ENCOUNTER — Other Ambulatory Visit: Payer: Self-pay | Admitting: Physician Assistant

## 2019-07-18 ENCOUNTER — Other Ambulatory Visit: Payer: Self-pay | Admitting: Internal Medicine

## 2019-07-18 DIAGNOSIS — E785 Hyperlipidemia, unspecified: Secondary | ICD-10-CM

## 2019-07-21 ENCOUNTER — Telehealth: Payer: Self-pay

## 2019-07-21 NOTE — Telephone Encounter (Signed)
   Primary Cardiologist: Glenetta Hew, MD  Chart reviewed as part of pre-operative protocol coverage. Given past medical history and time since last visit, based on ACC/AHA guidelines, [name] would be at acceptable risk for the planned procedure without further cardiovascular testing.   OK to hold Plavix 5-7 days pre dental work if needed. The patient in not on Coumadin.  I will route this recommendation to the requesting party via Epic fax function and remove from pre-op pool.  Please call with questions.  Kerin Ransom, PA-C 07/21/2019, 3:10 PM

## 2019-07-21 NOTE — Telephone Encounter (Signed)
Dr Ellyn Hack your note in March say its OK to hold Plavix for his dental procedure -how about Coumdin ?  Kerin Ransom PA-C 07/21/2019 1:52 PM

## 2019-07-21 NOTE — Telephone Encounter (Signed)
   [facility] Pre-operative Risk Assessment    Request for surgical clearance:  1. What type of surgery is being performed? Routine dental prophy and needs a crown on an upper premolar  2. When is this surgery scheduled? TBD  3. What type of clearance is required (medical clearance vs. Pharmacy clearance to hold med vs. Both)? both  4. Are there any medications that need to be held prior to surgery and how long? Plavix, Coumadin  5. Practice name and name of physician performing surgery? Christianne Borrow DDS   6. What is your office phone number [phone]   7.   What is your office fax number unknown  8.   Anesthesia type (None, local, MAC, general) ? unknown   Darlyn Chamber Soriyah Osberg 07/21/2019, 9:11 AM  _________________________________________________________________   (provider comments below)

## 2019-07-21 NOTE — Telephone Encounter (Signed)
For some reason warfarin did not come off his med list.  He is not currently taking warfarin.  That was stopped back in November.  Glenetta Hew, MD

## 2019-07-21 NOTE — Addendum Note (Signed)
Addended by: Michae Kava on: 07/21/2019 03:36 PM   Modules accepted: Orders

## 2019-07-29 ENCOUNTER — Telehealth: Payer: Self-pay | Admitting: *Deleted

## 2019-07-29 NOTE — Telephone Encounter (Signed)
PRIMARY  [facility] Pre-operative Risk Assessment    Request for surgical clearance:  1. What type of surgery is being performed? RELEASE /CLEARANCE DENTAL PRODECEURE - NEEDS CROWN ON UPPER PREMOLAR   2. When is this surgery scheduled? TBD  3. What type of clearance is required (medical clearance vs. Pharmacy clearance to hold med vs. Both)?  MEDICAL   4. Are there any medications that need to be held prior to surgery and how long? PLAVIX  5. Practice name and name of physician performing surgery?  Christianne Borrow ,DDS,PA   [address] , [place] [address]    6. What is your office phone number [phone]    7.   What is your office fax number  [phone]  8.   Anesthesia type (None, local, MAC, general) ?  UNKNOWN ,  ARE THERE ANY LOCAL ANESTHETIC RESTRICTIONS   Raiford Simmonds 07/29/2019, 2:15 PM  _________________________________________________________________   (provider comments below)

## 2019-07-29 NOTE — Telephone Encounter (Signed)
Spoke with pt and made him aware of the recommendations Per Dr. Ellyn Hack to hold Plavix for 5-7 days before his dental work. Pt informed me that is dental work with be done on 5/18. Pt verbalized understanding and thanked me for the call.

## 2019-07-29 NOTE — Telephone Encounter (Signed)
   Primary Cardiologist: Glenetta Hew, MD  Chart reviewed as part of pre-operative protocol coverage. Patient was contacted 07/29/2019 in reference to pre-operative risk assessment for pending surgery as outlined below.  Darryl [name] was last seen on 05/24/19 by Dr. Ellyn Hack.  At which time it was noted that he could stop plavix for planned dental procedure.  Therefore, based on ACC/AHA guidelines, the patient would be at acceptable risk for the planned procedure without further cardiovascular testing  Per Dr. Ellyn Hack- OK to hold Plavix 5-7 days pre dental work.  I will route this recommendation to the requesting party via Epic fax function and remove from pre-op pool.  Please call with questions.  Reino Bellis, NP 07/29/2019, 2:29 PM

## 2019-07-30 NOTE — Telephone Encounter (Signed)
I have attempted to Dentist office by phone because there is no fax number listed, but they are closed on Fridays. I attempted to look online for the fax number but none was listed. Will try again to call again on Monday when the office is open.Darryl [name]

## 2019-07-30 NOTE — Telephone Encounter (Signed)
Please fax Reino Bellis' letter dated 07/29/19.

## 2019-08-02 NOTE — Telephone Encounter (Addendum)
   Primary Cardiologist: Glenetta Hew, MD  Chart reviewed as part of pre-operative protocol coverage. Pre-op clearance decision already made, just needs faxing confirmation from callback team. Also personally reviewed chart for any SBE prophylaxis needs - not needed based on records. No further APP input needed - will remove from APP box.  Charlie Pitter, PA-C 08/02/2019, 10:07 AM

## 2019-08-03 NOTE — Telephone Encounter (Signed)
I s/w Dr. Evelene Croon office to obtain fax number. Fx number given to me is [phone]. I will fax clearance notes today. I did advise if they have nit received the clearance notes by noon time today to please call back and I will manually fax clearance notes. I am going to remove from the pre op call back pool.

## 2019-09-06 ENCOUNTER — Other Ambulatory Visit: Payer: Self-pay | Admitting: Internal Medicine

## 2019-10-07 ENCOUNTER — Other Ambulatory Visit: Payer: Self-pay | Admitting: Cardiology

## 2019-10-07 DIAGNOSIS — H18413 Arcus senilis, bilateral: Secondary | ICD-10-CM | POA: Diagnosis not present

## 2019-10-07 DIAGNOSIS — H2513 Age-related nuclear cataract, bilateral: Secondary | ICD-10-CM | POA: Diagnosis not present

## 2019-10-07 DIAGNOSIS — H2512 Age-related nuclear cataract, left eye: Secondary | ICD-10-CM | POA: Diagnosis not present

## 2019-10-07 DIAGNOSIS — H18593 Other hereditary corneal dystrophies, bilateral: Secondary | ICD-10-CM | POA: Diagnosis not present

## 2019-10-07 DIAGNOSIS — H25043 Posterior subcapsular polar age-related cataract, bilateral: Secondary | ICD-10-CM | POA: Diagnosis not present

## 2019-10-07 DIAGNOSIS — H25013 Cortical age-related cataract, bilateral: Secondary | ICD-10-CM | POA: Diagnosis not present

## 2019-10-08 NOTE — Telephone Encounter (Signed)
Rx(s) sent to pharmacy electronically.  

## 2019-10-11 DIAGNOSIS — L820 Inflamed seborrheic keratosis: Secondary | ICD-10-CM | POA: Diagnosis not present

## 2019-10-12 ENCOUNTER — Telehealth: Payer: Self-pay | Admitting: Internal Medicine

## 2019-10-12 NOTE — Telephone Encounter (Signed)
New Message:   Pt is calling and states Dr. Ronnald Ramp has given him Ambien in the past when they go on trips. Pt states they are getting ready to travel and he would like a prescription of this medication sent to [place], [place] Please advise.

## 2019-10-12 NOTE — Telephone Encounter (Signed)
Pt is due for an office visit. Last visit was 09/2018

## 2019-10-13 NOTE — Telephone Encounter (Signed)
Pt contacted and scheduled for 08/16 at 08:40am.   Please advise if ambien rx can be sent.

## 2019-10-14 ENCOUNTER — Other Ambulatory Visit: Payer: Self-pay | Admitting: Internal Medicine

## 2019-10-14 DIAGNOSIS — F5104 Psychophysiologic insomnia: Secondary | ICD-10-CM

## 2019-10-14 MED ORDER — ZOLPIDEM TARTRATE 10 MG PO TABS: 10.0000 mg | ORAL_TABLET | Freq: Every evening | ORAL | 0 refills | Status: DC | PRN

## 2019-10-19 ENCOUNTER — Other Ambulatory Visit: Payer: Self-pay | Admitting: Internal Medicine

## 2019-10-20 DIAGNOSIS — H2512 Age-related nuclear cataract, left eye: Secondary | ICD-10-CM | POA: Diagnosis not present

## 2019-10-21 DIAGNOSIS — H2511 Age-related nuclear cataract, right eye: Secondary | ICD-10-CM | POA: Diagnosis not present

## 2019-11-03 DIAGNOSIS — H2511 Age-related nuclear cataract, right eye: Secondary | ICD-10-CM | POA: Diagnosis not present

## 2019-11-03 LAB — HM DIABETES EYE EXAM

## 2019-11-04 DIAGNOSIS — H2511 Age-related nuclear cataract, right eye: Secondary | ICD-10-CM | POA: Diagnosis not present

## 2019-11-08 ENCOUNTER — Ambulatory Visit: Payer: Medicare PPO | Admitting: Internal Medicine

## 2019-11-08 ENCOUNTER — Encounter: Payer: Self-pay | Admitting: Internal Medicine

## 2019-11-08 ENCOUNTER — Other Ambulatory Visit: Payer: Self-pay

## 2019-11-08 VITALS — BP 146/86 | HR 79 | Temp 98.3°F | Ht 66.5 in | Wt 183.2 lb

## 2019-11-08 DIAGNOSIS — E118 Type 2 diabetes mellitus with unspecified complications: Secondary | ICD-10-CM

## 2019-11-08 DIAGNOSIS — I255 Ischemic cardiomyopathy: Secondary | ICD-10-CM | POA: Diagnosis not present

## 2019-11-08 DIAGNOSIS — Z0000 Encounter for general adult medical examination without abnormal findings: Secondary | ICD-10-CM

## 2019-11-08 DIAGNOSIS — I10 Essential (primary) hypertension: Secondary | ICD-10-CM

## 2019-11-08 DIAGNOSIS — E781 Pure hyperglyceridemia: Secondary | ICD-10-CM | POA: Diagnosis not present

## 2019-11-08 DIAGNOSIS — D510 Vitamin B12 deficiency anemia due to intrinsic factor deficiency: Secondary | ICD-10-CM | POA: Diagnosis not present

## 2019-11-08 DIAGNOSIS — E785 Hyperlipidemia, unspecified: Secondary | ICD-10-CM | POA: Diagnosis not present

## 2019-11-08 DIAGNOSIS — R351 Nocturia: Secondary | ICD-10-CM

## 2019-11-08 DIAGNOSIS — I2510 Atherosclerotic heart disease of native coronary artery without angina pectoris: Secondary | ICD-10-CM | POA: Diagnosis not present

## 2019-11-08 DIAGNOSIS — Z23 Encounter for immunization: Secondary | ICD-10-CM

## 2019-11-08 DIAGNOSIS — N401 Enlarged prostate with lower urinary tract symptoms: Secondary | ICD-10-CM

## 2019-11-08 MED ORDER — IRBESARTAN 75 MG PO TABS: 75.0000 mg | ORAL_TABLET | Freq: Every day | ORAL | 1 refills | Status: DC

## 2019-11-08 MED ORDER — SHINGRIX 50 MCG/0.5ML IM SUSR: 0.5000 mL | Freq: Once | INTRAMUSCULAR | 1 refills | Status: AC

## 2019-11-08 NOTE — Patient Instructions (Addendum)

## 2019-11-08 NOTE — Progress Notes (Signed)
Subjective:  Patient ID: Darryl [name], male    DOB: 14-Jun-1950  Age: 69 y.o. MRN: [id number]  CC: Annual Exam, Hypertension, and Hyperlipidemia  This visit occurred during the SARS-CoV-2 public health emergency.  Safety protocols were in place, including screening questions prior to the visit, additional usage of staff PPE, and extensive cleaning of exam room while observing appropriate contact time as indicated for disinfecting solutions.    HPI Darryl [name] presents for a CPX.  He is very active.  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  He does not experience chest pain, shortness of breath, palpitations, edema, or fatigue.  He denies any recent episodes of headache, blurred vision, edema, diaphoresis, dizziness, or lightheadedness.  Outpatient Medications Prior to Visit  Medication Sig Dispense Refill  . albuterol (VENTOLIN HFA) 108 (90 Base) MCG/ACT inhaler Inhale 2 puffs into the lungs every 6 (six) hours as needed for wheezing. 18 g 3  . cholecalciferol (VITAMIN D3) 25 MCG (1000 UT) tablet Take 1,000 Units by mouth daily.    . clopidogrel (PLAVIX) 75 MG tablet Take 1 tablet (75 mg total) by mouth daily. 90 tablet 2  . Coenzyme Q10 (CO Q 10 PO) Take 100 mg by mouth.    . DUREZOL 0.05 % EMUL     . fluticasone-salmeterol (ADVAIR HFA) 115-21 MCG/ACT inhaler Inhale 2 puffs into the lungs 2 (two) times daily. 12 g 5  . folic acid (FOLVITE) 1 MG tablet TAKE 1 TABLET ONCE DAILY. (Patient taking differently: Take 1 mg by mouth daily. ) 90 tablet 1  . gatifloxacin (ZYMAXID) 0.5 % SOLN Place 1 drop into the left eye 4 (four) times daily.    Marland Kitchen HUMIRA PEN 40 MG/0.4ML PNKT every 14 (fourteen) days.     . hydrocortisone 2.5 % cream Apply 1 application topically daily.     Marland Kitchen loperamide (IMODIUM A-D) 2 MG tablet Take 2 mg by mouth 4 (four) times daily as needed for diarrhea or loose stools.    . metoprolol succinate (TOPROL XL) 25 MG 24 hr tablet Take 1 tablet  (25 mg total) by mouth daily. 90 tablet 3  . midodrine (PROAMATINE) 2.5 MG tablet TAKE 1 TAB IN AM. MAY TAKE AN ADDITIONAL TAB AFTER 6HR IF STILL DIZZY/SYSTOLIC YB<01 40 tablet 1  . montelukast (SINGULAIR) 10 MG tablet TAKE ONE TABLET AT BEDTIME. 30 tablet 0  . Multiple Vitamins-Minerals (CENTRUM SILVER PO) Take 1 tablet by mouth daily.    Marland Kitchen MURO 128 5 % ophthalmic ointment daily.    . nitroGLYCERIN (NITROSTAT) 0.4 MG SL tablet Place 1 tablet (0.4 mg total) under the tongue every 5 (five) minutes x 3 doses as needed for chest pain. 25 tablet 3  . pantoprazole (PROTONIX) 40 MG tablet TAKE 1 TABLET ONCE DAILY. 90 tablet 0  . PROLENSA 0.07 % SOLN Place 1 drop into the left eye at bedtime.    . rosuvastatin (CRESTOR) 40 MG tablet TAKE 1 TABLET ONCE DAILY. 90 tablet 3  . vitamin B-12 (CYANOCOBALAMIN) 1000 MCG tablet Take 1 tablet (1,000 mcg total) by mouth daily.    Marland Kitchen zolpidem (AMBIEN) 10 MG tablet Take 1 tablet (10 mg total) by mouth at bedtime as needed. 30 tablet 0  . Omega-3 Fatty Acids (DIALYVITE OMEGA-3 CONCENTRATE) 600 MG CAPS Take 1 tablet by mouth 2 (two) times daily.    Marland Kitchen losartan (COZAAR) 25 MG tablet Take 1 tablet (25 mg total) by mouth  daily. 90 tablet 3  . traMADol (ULTRAM) 50 MG tablet TAKE ONE TABLET EVERY 6 HOURS AS NEEDED. 30 tablet 0   No facility-administered medications prior to visit.    ROS Review of Systems  Constitutional: Negative.  Negative for appetite change, diaphoresis, fatigue and unexpected weight change.  HENT: Negative.   Eyes: Negative.   Respiratory: Negative for cough, chest tightness, shortness of breath and wheezing.   Cardiovascular: Negative for chest pain, palpitations and leg swelling.  Gastrointestinal: Negative for abdominal pain, constipation, diarrhea, nausea and vomiting.  Endocrine: Negative.   Genitourinary: Negative.  Negative for difficulty urinating, dysuria, hematuria and urgency.  Musculoskeletal: Negative for arthralgias and myalgias.   Skin: Negative.  Negative for color change and pallor.  Neurological: Negative.  Negative for dizziness, weakness and light-headedness.  Hematological: Negative for adenopathy. Does not bruise/bleed easily.  Psychiatric/Behavioral: Negative.     Objective:  BP (!) 146/86 (BP Location: Left Arm, Patient Position: Sitting, Cuff Size: Normal)   Pulse 79   Temp 98.3 F (36.8 C) (Oral)   Ht 5' 6.5" (1.689 m)   Wt 183 lb 4 oz (83.1 kg)   SpO2 96%   BMI 29.13 kg/m   BP Readings from Last 3 Encounters:  11/08/19 (!) 146/86  05/24/19 137/87  04/06/19 (!) 156/85    Wt Readings from Last 3 Encounters:  11/08/19 183 lb 4 oz (83.1 kg)  05/24/19 181 lb 6.4 oz (82.3 kg)  03/29/19 184 lb 3.2 oz (83.6 kg)    Physical Exam Vitals reviewed.  Constitutional:      Appearance: Normal appearance.  HENT:     Nose: Nose normal.     Mouth/Throat:     Mouth: Mucous membranes are moist.  Eyes:     General: No scleral icterus.    Conjunctiva/sclera: Conjunctivae normal.  Cardiovascular:     Rate and Rhythm: Normal rate and regular rhythm.     Heart sounds: No murmur heard.   Pulmonary:     Effort: Pulmonary effort is normal.     Breath sounds: No stridor. No wheezing, rhonchi or rales.  Abdominal:     General: Abdomen is flat. Bowel sounds are normal. There is no distension.     Palpations: Abdomen is soft. There is no hepatomegaly, splenomegaly or mass.     Tenderness: There is no abdominal tenderness.     Hernia: No hernia is present. There is no hernia in the left inguinal area or right inguinal area.  Genitourinary:    Pubic Area: No rash.      Penis: Normal.      Testes: Normal.     Epididymis:     Right: Normal.     Left: Normal.     Prostate: Enlarged (1+ smooth symm BPH). Not tender and no nodules present.     Rectum: Guaiac result negative. Internal hemorrhoid present. No mass, tenderness, anal fissure or external hemorrhoid. Normal anal tone.  Musculoskeletal:         General: Normal range of motion.     Cervical back: Neck supple.     Right lower leg: No edema.     Left lower leg: No edema.  Lymphadenopathy:     Cervical: No cervical adenopathy.     Lower Body: No right inguinal adenopathy. No left inguinal adenopathy.  Skin:    General: Skin is warm and dry.     Coloration: Skin is not pale.  Neurological:     General: No focal deficit present.  Mental Status: He is alert and oriented to person, place, and time. Mental status is at baseline.  Psychiatric:        Mood and Affect: Mood normal.        Behavior: Behavior normal.     Lab Results  Component Value Date   WBC 6.4 11/08/2019   HGB 15.5 11/08/2019   HCT 45.9 11/08/2019   PLT 242 11/08/2019   GLUCOSE 112 (H) 11/08/2019   CHOL 162 11/08/2019   TRIG 198 (H) 11/08/2019   HDL 72 11/08/2019   LDLCALC 63 11/08/2019   ALT 28 11/08/2019   AST 24 11/08/2019   NA 140 11/08/2019   K 4.1 11/08/2019   CL 106 11/08/2019   CREATININE 1.07 11/08/2019   BUN 18 11/08/2019   CO2 26 11/08/2019   TSH 1.16 11/08/2019   PSA 0.1 11/08/2019   INR 2.8 04/02/2019   HGBA1C 5.9 (H) 11/08/2019   MICROALBUR 1.6 11/08/2019    No results found.  Assessment & Plan:   Darryl [name] was seen today for annual exam, hypertension and hyperlipidemia.  Diagnoses and all orders for this visit:  Type II diabetes mellitus with manifestations ([place])- His A1c is at 5.9%.  His blood sugars are very well controlled. -     HM Diabetes Foot Exam -     BASIC METABOLIC PANEL WITH GFR; Future -     Hemoglobin A1c; Future -     Microalbumin / creatinine urine ratio; Future -     irbesartan (AVAPRO) 75 MG tablet; Take 1 tablet (75 mg total) by mouth daily. -     Microalbumin / creatinine urine ratio -     Hemoglobin A1c -     BASIC METABOLIC PANEL WITH GFR  Coronary artery disease involving native coronary artery of native heart without angina pectoris- He has had no recent episodes of angina.  Will continue to address  risk factor modifications. -     irbesartan (AVAPRO) 75 MG tablet; Take 1 tablet (75 mg total) by mouth daily. -     icosapent Ethyl (VASCEPA) 1 g capsule; Take 2 capsules (2 g total) by mouth 2 (two) times daily.  Ischemic cardiomyopathy- I think he would benefit from taking an ARB. -     irbesartan (AVAPRO) 75 MG tablet; Take 1 tablet (75 mg total) by mouth daily.  BPH associated with nocturia- His PSA is low which is a reassuring sign that he does not have prostate cancer.  He has no symptoms that need to be treated. -     PSA; Future -     Urinalysis, Routine w reflex microscopic; Future -     Urinalysis, Routine w reflex microscopic -     PSA  Hyperlipidemia with target LDL less than 70-he has achieved his LDL goal is doing well on the statin. -     Lipid panel; Future -     TSH; Future -     Hepatic function panel; Future -     Hepatic function panel -     TSH -     Lipid panel  Routine general medical examination at a health care facility- Exam completed, labs reviewed, vaccines reviewed and updated, cancer screenings are up-to-date, patient education was given.  Vitamin B12 deficiency anemia due to intrinsic factor deficiency- His H&H, B12, and folate levels are normal now. -     CBC with Differential/Platelet; Future -     Vitamin B12; Future -  Folate; Future -     Folate -     Vitamin B12 -     CBC with Differential/Platelet  Essential hypertension- His blood pressure is not adequately well controlled.  I recommended that he start taking an ARB. -     irbesartan (AVAPRO) 75 MG tablet; Take 1 tablet (75 mg total) by mouth daily.  Need for shingles vaccine -     Zoster Vaccine Adjuvanted [facility]) injection; Inject 0.5 mLs into the muscle once for 1 dose.  Hypertriglyceridemia- I have asked him to start taking icosapent ethyl to reduce the risk of complications from hypertriglyceridemia and for cardiovascular risk reduction. -     icosapent Ethyl (VASCEPA) 1 g  capsule; Take 2 capsules (2 g total) by mouth 2 (two) times daily.   I have discontinued Karl Pock "Jilberto Milian"'s Dialyvite Omega-3 Concentrate, traMADol, and losartan. I am also having him start on irbesartan, Shingrix, and icosapent Ethyl. Additionally, I am having him maintain his vitamin P-59, folic acid, hydrocortisone, Multiple Vitamins-Minerals (CENTRUM SILVER PO), loperamide, nitroGLYCERIN, cholecalciferol, albuterol, metoprolol succinate, midodrine, Advair HFA, Humira Pen, rosuvastatin, pantoprazole, clopidogrel, zolpidem, montelukast, Prolensa, Durezol, gatifloxacin, Muro 128, and Coenzyme Q10 (CO Q 10 PO).  Meds ordered this encounter  Medications  . irbesartan (AVAPRO) 75 MG tablet    Sig: Take 1 tablet (75 mg total) by mouth daily.    Dispense:  90 tablet    Refill:  1  . Zoster Vaccine Adjuvanted [facility]) injection    Sig: Inject 0.5 mLs into the muscle once for 1 dose.    Dispense:  0.5 mL    Refill:  1  . icosapent Ethyl (VASCEPA) 1 g capsule    Sig: Take 2 capsules (2 g total) by mouth 2 (two) times daily.    Dispense:  360 capsule    Refill:  1   I spent 50 minutes in preparing to see the patient by review of recent labs, imaging and procedures, obtaining and reviewing separately obtained history, communicating with the patient and family or caregiver, ordering medications, tests or procedures, and documenting clinical information in the EHR including the differential Dx, treatment, and any further evaluation and other management of 1. Type II diabetes mellitus with manifestations ([place]) 2. Coronary artery disease involving native coronary artery of native heart without angina pectoris 3. Ischemic cardiomyopathy 4. BPH associated with nocturia 5. Hyperlipidemia with target LDL less than 70 6. Vitamin B12 deficiency anemia due to intrinsic factor deficiency 7. Essential hypertension 8. Hypertriglyceridemia     Follow-up: Return in about 6 months (around  05/10/2020).  Scarlette Calico, MD

## 2019-11-09 DIAGNOSIS — Z23 Encounter for immunization: Secondary | ICD-10-CM | POA: Insufficient documentation

## 2019-11-09 DIAGNOSIS — E781 Pure hyperglyceridemia: Secondary | ICD-10-CM | POA: Insufficient documentation

## 2019-11-09 LAB — HEPATIC FUNCTION PANEL
AG Ratio: 1.8 (calc) (ref 1.0–2.5)
ALT: 28 U/L (ref 9–46)
AST: 24 U/L (ref 10–35)
Albumin: 4.3 g/dL (ref 3.6–5.1)
Alkaline phosphatase (APISO): 71 U/L (ref 35–144)
Bilirubin, Direct: 0.1 mg/dL (ref 0.0–0.2)
Globulin: 2.4 g/dL (calc) (ref 1.9–3.7)
Indirect Bilirubin: 0.5 mg/dL (calc) (ref 0.2–1.2)
Total Bilirubin: 0.6 mg/dL (ref 0.2–1.2)
Total Protein: 6.7 g/dL (ref 6.1–8.1)

## 2019-11-09 LAB — URINALYSIS, ROUTINE W REFLEX MICROSCOPIC
Bilirubin Urine: NEGATIVE
Glucose, UA: NEGATIVE
Hgb urine dipstick: NEGATIVE
Ketones, ur: NEGATIVE
Leukocytes,Ua: NEGATIVE
Nitrite: NEGATIVE
Protein, ur: NEGATIVE
Specific Gravity, Urine: 1.023 (ref 1.001–1.03)
pH: 5.5 (ref 5.0–8.0)

## 2019-11-09 LAB — CBC WITH DIFFERENTIAL/PLATELET
Absolute Monocytes: 736 cells/uL (ref 200–950)
Basophils Absolute: 32 cells/uL (ref 0–200)
Basophils Relative: 0.5 %
Eosinophils Absolute: 32 cells/uL (ref 15–500)
Eosinophils Relative: 0.5 %
HCT: 45.9 % (ref 38.5–50.0)
Hemoglobin: 15.5 g/dL (ref 13.2–17.1)
Lymphs Abs: 1645 cells/uL (ref 850–3900)
MCH: 33 pg (ref 27.0–33.0)
MCHC: 33.8 g/dL (ref 32.0–36.0)
MCV: 97.7 fL (ref 80.0–100.0)
MPV: 10 fL (ref 7.5–12.5)
Monocytes Relative: 11.5 %
Neutro Abs: 3955 cells/uL (ref 1500–7800)
Neutrophils Relative %: 61.8 %
Platelets: 242 10*3/uL (ref 140–400)
RBC: 4.7 10*6/uL (ref 4.20–5.80)
RDW: 12.3 % (ref 11.0–15.0)
Total Lymphocyte: 25.7 %
WBC: 6.4 10*3/uL (ref 3.8–10.8)

## 2019-11-09 LAB — MICROALBUMIN / CREATININE URINE RATIO
Creatinine, Urine: 173 mg/dL (ref 20–320)
Microalb Creat Ratio: 9 mcg/mg creat (ref <30)
Microalb, Ur: 1.6 mg/dL

## 2019-11-09 LAB — BASIC METABOLIC PANEL WITH GFR
BUN: 18 mg/dL (ref 7–25)
CO2: 26 mmol/L (ref 20–32)
Calcium: 9.4 mg/dL (ref 8.6–10.3)
Chloride: 106 mmol/L (ref 98–110)
Creat: 1.07 mg/dL (ref 0.70–1.18)
GFR, Est African American: 81 mL/min/{1.73_m2} (ref >=60)
GFR, Est Non African American: 70 mL/min/{1.73_m2} (ref >=60)
Glucose, Bld: 112 mg/dL — ABNORMAL HIGH (ref 65–99)
Potassium: 4.1 mmol/L (ref 3.5–5.3)
Sodium: 140 mmol/L (ref 135–146)

## 2019-11-09 LAB — LIPID PANEL
Cholesterol: 162 mg/dL (ref <200)
HDL: 72 mg/dL (ref >=40)
LDL Cholesterol (Calc): 63 mg/dL (calc)
Non-HDL Cholesterol (Calc): 90 mg/dL (calc) (ref <130)
Total CHOL/HDL Ratio: 2.3 (calc) (ref <5.0)
Triglycerides: 198 mg/dL — ABNORMAL HIGH (ref <150)

## 2019-11-09 LAB — PSA: PSA: 0.1 ng/mL (ref <=4.0)

## 2019-11-09 LAB — HEMOGLOBIN A1C
Hgb A1c MFr Bld: 5.9 % of total Hgb — ABNORMAL HIGH (ref <5.7)
Mean Plasma Glucose: 123 (calc)
eAG (mmol/L): 6.8 (calc)

## 2019-11-09 LAB — FOLATE: Folate: >24 ng/mL

## 2019-11-09 LAB — VITAMIN B12: Vitamin B-12: 644 pg/mL (ref 200–1100)

## 2019-11-09 LAB — TSH: TSH: 1.16 mIU/L (ref 0.40–4.50)

## 2019-11-09 MED ORDER — ICOSAPENT ETHYL 1 G PO CAPS: 2.0000 g | ORAL_CAPSULE | Freq: Two times a day (BID) | ORAL | 1 refills | Status: DC

## 2019-11-11 ENCOUNTER — Encounter: Payer: Self-pay | Admitting: Internal Medicine

## 2019-11-18 ENCOUNTER — Encounter: Payer: Self-pay | Admitting: Internal Medicine

## 2019-11-23 ENCOUNTER — Other Ambulatory Visit: Payer: Self-pay | Admitting: Internal Medicine

## 2019-11-23 ENCOUNTER — Ambulatory Visit: Payer: Self-pay | Attending: Internal Medicine

## 2019-11-23 DIAGNOSIS — Z23 Encounter for immunization: Secondary | ICD-10-CM

## 2019-11-23 NOTE — Progress Notes (Signed)
   Covid-19 Vaccination Clinic  Name:  Darryl [name]    MRN: [id number] DOB: [birth year]  11/23/2019  [name] was observed post Covid-19 immunization for 15 minutes without incident. He was provided with Vaccine Information Sheet and instruction to access the V-Safe system.   Darryl [name] was instructed to call [phone] with any severe reactions post vaccine: Marland Kitchen Difficulty breathing  . Swelling of face and throat  . A fast heartbeat  . A bad rash all over body  . Dizziness and weakness

## 2019-11-24 ENCOUNTER — Ambulatory Visit: Payer: Medicare PPO | Admitting: Cardiology

## 2019-11-24 ENCOUNTER — Other Ambulatory Visit: Payer: Self-pay

## 2019-11-24 VITALS — BP 112/60 | HR 72 | Ht 66.5 in | Wt 184.2 lb

## 2019-11-24 DIAGNOSIS — I2510 Atherosclerotic heart disease of native coronary artery without angina pectoris: Secondary | ICD-10-CM | POA: Diagnosis not present

## 2019-11-24 DIAGNOSIS — I255 Ischemic cardiomyopathy: Secondary | ICD-10-CM

## 2019-11-24 DIAGNOSIS — E781 Pure hyperglyceridemia: Secondary | ICD-10-CM | POA: Diagnosis not present

## 2019-11-24 DIAGNOSIS — I10 Essential (primary) hypertension: Secondary | ICD-10-CM

## 2019-11-24 DIAGNOSIS — Z9861 Coronary angioplasty status: Secondary | ICD-10-CM | POA: Diagnosis not present

## 2019-11-24 DIAGNOSIS — E785 Hyperlipidemia, unspecified: Secondary | ICD-10-CM

## 2019-11-24 NOTE — Progress Notes (Deleted)
PCP: Janith Lima, MD  Clinic Note: No chief complaint on file.   HPI: Darryl [name] is a 69 y.o. male with a PMH notable for recent inferior STEMI found to have CTO LAD (had PCI of occluded RCA followed by staged PCI of Ramus Intermedius) with ischemic cardiomyopathy below who presents today for 49-monthfollow-up  --> He has been limited as far as medication medical management for his cardiomyopathy based on him having orthostatic hypotension requiring Midodrine  Admitted from 6/29-7/1 2020 for anemia with hemoglobin down to 5.0.  Had no evidence of melena, hematochezia or hematuria.  FOBT was negative.  Noted to have macrocytic anemia, treated with 4 units PRBC. -->  Was thought to be related to Crohn's medications (azathioprine) --> this was not thought to be related to a GI bleed.  Therefore was placed back on Brilinta but no aspirin.  Midrin continued for orthostatic hypotension.  LOLIVE MOTYKAwas last seen by me in on December 22, 2018 to follow-up for cardiac MRI.-Noted improved EF but left ventricular apical thrombus noted.  Was back to his routine level of exercise.  Was still having short little bursts of nonsustained VT.  No PND, orthopnea or edema.  Abdominal pain controlled from Crohn's.  Was taking morning dose of midodrine. -> Started on Toprol 25 mg daily, decided to hold on ARB.  Continued clopidogrel and rosuvastatin.  Echo ordered.  Recent Hospitalizations:   Studies Personally Reviewed - (if available, images/films reviewed: From Epic Chart or Care Everywhere)  04/06/2019:Echo: EF 40 and 45%.  Distal septal and apical akinesis.  No LV thrombus noted.  Mild LVH.  GR 1 DD.  Normal RV.  Mild LA dilation.  Normal RA.  Mild aortic valve sclerosis with no stenosis - mild AI    Stopped Coumadin -- On Plavix   Interval History: Darryl [name] today for 6 month f/u doing great.  He is very active now he goes to the gym about 2 to 3 days a week and does stretching  exercises as well as some strength and conditioning exercises.  He walks just about every day for at least a mile or 2 a day.  He plays golf at least once or twice a week but sometimes more frequently.  As weather improves, he plans to get back on his bicycle both his inside bike and outside bike.  He has not had any further chest pain or pressure with rest or exertion.  Although he has had PVCs in the past, he is not noting any palpitations or rapid irregular heartbeats.  No heart failure symptoms despite having "cardiomyopathy ".  No claudication.  His GI doctor switched him over to Humira for his Crohn's, and things seem to be totally stable now.  No melena, hematochezia, hematuria, epistaxis.  As for his dizziness and orthostatic hypotension, he has not had to use midodrine but may be 3 times since I last saw him.  Seems to be tolerating current dose of metoprolol well.  Cardiovascular Review of Symptoms: no chest pain or dyspnea on exertion negative for - edema, irregular heartbeat, orthopnea, palpitations, paroxysmal nocturnal dyspnea, rapid heart rate, shortness of breath or Syncope/near syncope, TIA/amaurosis fugax, claudication  The patient DOES NOT have symptoms concerning for COVID-19 infection (fever, chills, cough, or new shortness of breath).  The patient is practicing social distancing.  ROS: A comprehensive was performed. Review of Systems  Constitutional: Negative for malaise/fatigue and weight loss.  HENT: Negative for congestion and  nosebleeds.   Respiratory: Negative for cough and shortness of breath.   Cardiovascular: Negative for claudication and leg swelling.  Gastrointestinal: Negative for abdominal pain (none since Humira), blood in stool, constipation and diarrhea.  Musculoskeletal: Negative for falls, joint pain and myalgias.  Neurological: Negative for dizziness (Stable taking a.m. dose of midodrine), focal weakness and weakness.       Occasionally light headed in AM    Psychiatric/Behavioral: Negative for depression. The patient has insomnia (trouble getting to sleep - then wakes up -hard to get back to sleep (mind racing)). The patient is not nervous/anxious.    I have reviewed and (if needed) personally updated the patient's problem list, medications, allergies, past medical and surgical history, social and family history.   Past Medical History:  Diagnosis Date  . Acute upper respiratory infections of unspecified site   . Allergic rhinitis, cause unspecified   . Anal stenosis   . Asthma   . Cataract   . Chronic orthostatic hypotension    Related to borderline hypotension; intolerant of blood pressure medications.  On Midodrine  . Coronary artery disease involving native coronary artery of native heart without angina pectoris 06/2018    p-mRCA 100% (DES PCI - p-mRCA 100% (DES PCI - Resolute Onyx 3.5 x 26 --> ~3.7 mm)), dRCA ~50% & PAV 40%; pRI 85% (Staged DES PCI  Resolute Onyx 2.25 x 15 - ~2.5 mm). 100% LAD CTO after small D1 with ost-prox 60%. R-L collaterals to LAD seen post PCI.  Marland Kitchen Crohn's colitis ([place])   . Exostosis of unspecified site   . GERD (gastroesophageal reflux disease)   . History of acute inferior wall MI 06/2018   100% p-mRCA -- DES PCI (complicated by mild cardiogenic shock, partially related to 100% LAD CTO) --> staged PCI-RI  . Hyperlipidemia with target LDL less than 70 07/04/2015   Now s/p MI with MV CAD - optimal LDL < 50  . Hyperplastic colon polyp 02/26/2008  . Hypertension   . Ischemic cardiomyopathy    Initial echo shows EF of 40 to 45% with anterior hypokinesis. Cardiac MRI shows EF of 49% with anterior anteroapical hypokinesis/akinesis and LV thrombus.  . Lateral epicondylitis  of elbow   . Left ventricular thrombus following MI ([place]) 10/10/2018   Cardiac MRI 09/28/2018: Mildly dilated LV.  EF estimated roughly 49%.  Hypokinesis of the mid anterior, anteroseptal and apical anterior wall.  There is also apical thrombus  (partially layered) 22 x 17 x 11.  . Leukopenia   . Macrocytic anemia    Related to azathioprine  . Nontraumatic rupture of patellar tendon   . Obesity, unspecified   . Regional enteritis of unspecified site   . Seasonal allergies   . Sessile colonic polyp   . Vitamin B12 deficiency    Immunization History  Administered Date(s) Administered  . Fluad Quad(high Dose 65+) 01/21/2019  . Hepb-cpg 10/12/2018, 11/13/2018  . Influenza, High Dose Seasonal PF 01/07/2017, 02/23/2018  . Influenza,inj,Quad PF,6+ Mos 02/05/2013  . PFIZER SARS-COV-2 Vaccination 05/01/2019, 05/22/2019, 11/23/2019  . PPD Test 11/26/2011  . Pneumococcal Conjugate-13 07/04/2015  . Pneumococcal Polysaccharide-23 05/19/2013, 02/04/2019  . Tdap 05/19/2013    Past Surgical History:  Procedure Laterality Date  . CARDIAC MRI  09/2018   Mildly dilated LV.  EF estimated roughly 49%.  Hypokinesis of the mid anterior, anteroseptal and apical anterior wall.  There is also apical thrombus (partially layered) 22 x 17 x 11. -->  50-75% late gadolinium enhancement of the  mid anterior, anteroseptal and apical anterior and septal walls suggestive of near full-thickness scar w/ minimal viability (poor recovery prognosis w/ REVASCULARIZATION  . COLONOSCOPY     2016  . CORONARY STENT INTERVENTION N/A 07/24/2018   Procedure: CORONARY STENT INTERVENTION;  Surgeon: Troy Sine, MD;  Location: [facility];  Service: Cardiovascular: Staged PCI pRI 85-90%: Resolute Onyx DES 2.25 x 15 (2.5 mm)  . CORONARY/GRAFT ACUTE MI REVASCULARIZATION N/A 07/23/2018   Procedure: CORONARY/GRAFT ACUTE MI REVASCULARIZATION;  Surgeon: [name], MD;  Location: [facility];  Service: Cardiovascular;::  p-mRCA 100% (DES PCI - p-mRCA 100% (DES PCI - Resolute Onyx 3.5 x 26 (post-dilated ~3.7 mm)), dRCA ~50% & PAV 40%; pRI 85% (Staged PCI). 100% LAD CTO after small D1 with ost-prox 60%. R-L collaterals to LAD seen post PCI.;   . LEFT HEART CATH  N/A 07/24/2018   Procedure: LEFT HEART CATH;  Surgeon: Troy Sine, MD;  Location: [facility];  Service: Cardiovascular;  Laterality: N/A; 85-90% pRCA, 60% ost D1 then 100% CTO of LAD -- staged DES PCI LAD  . LEFT HEART CATH AND CORONARY ANGIOGRAPHY  07/23/2018   Procedure: LEFT HEART CATH;  Surgeon: [name]., MD;  Location: [facility]   p-mRCA 100% (DES PCI),dRCA ~50% & PAV 40%; pRI 85% (Staged PCI). 100% LAD CTO after small D1 with ost-prox 60%. R-L collaterals to LAD seen post PCI.;   . RIGHT HEART CATH N/A 07/23/2018   Procedure: RIGHT HEART CATH;  Surgeon: [name], MD;  Location: [facility];  Service: Cardiovascular;  Normal RHC #s, CO/CI normal  . TONSILLECTOMY AND ADENOIDECTOMY    . TRANSTHORACIC ECHOCARDIOGRAM  07/23/2018   Insetting of inferior MI with occluded LAD CTO; --Severe akinesis of the mid anteroseptal wall.  EF 40 and 45%.  GR 1 DD.;;     Diagnostic #1: 07/23/2018 Dominance: Right   After staged PCI -07/24/2018    04/06/2019:Echo: EF 40 and 45%.  Distal septal and apical akinesis.  No LV thrombus noted.  Mild LVH.  GR 1 DD.  Normal RV.  Mild LA dilation.  Normal RA.  Mild aortic valve sclerosis with no stenosis - mild AI   ? Stopped Coumadin -- On Plavix   Current Meds  Medication Sig  . albuterol (VENTOLIN HFA) 108 (90 Base) MCG/ACT inhaler Inhale 2 puffs into the lungs every 6 (six) hours as needed for wheezing.  . cholecalciferol (VITAMIN D3) 25 MCG (1000 UT) tablet Take 1,000 Units by mouth daily.  . clopidogrel (PLAVIX) 75 MG tablet Take 1 tablet (75 mg total) by mouth daily.  . Coenzyme Q10 (CO Q 10 PO) Take 100 mg by mouth.  . DUREZOL 0.05 % EMUL   . fluticasone-salmeterol (ADVAIR HFA) 115-21 MCG/ACT inhaler Inhale 2 puffs into the lungs 2 (two) times daily.  . folic acid (FOLVITE) 1 MG tablet TAKE 1 TABLET ONCE DAILY. (Patient taking differently: Take 1 mg by mouth daily. )  . gatifloxacin (ZYMAXID) 0.5 % SOLN Place 1 drop  into the left eye 4 (four) times daily.  Marland Kitchen HUMIRA PEN 40 MG/0.4ML PNKT every 14 (fourteen) days.   . hydrocortisone 2.5 % cream Apply 1 application topically daily.   Marland Kitchen icosapent Ethyl (VASCEPA) 1 g capsule Take 2 capsules (2 g total) by mouth 2 (two) times daily.  . irbesartan (AVAPRO) 75 MG tablet Take 1 tablet (75 mg total) by mouth daily.  Marland Kitchen loperamide (  IMODIUM A-D) 2 MG tablet Take 2 mg by mouth 4 (four) times daily as needed for diarrhea or loose stools.  . metoprolol succinate (TOPROL XL) 25 MG 24 hr tablet Take 1 tablet (25 mg total) by mouth daily.  . midodrine (PROAMATINE) 2.5 MG tablet TAKE 1 TAB IN AM. MAY TAKE AN ADDITIONAL TAB AFTER 6HR IF STILL DIZZY/SYSTOLIC IP<38  . montelukast (SINGULAIR) 10 MG tablet TAKE ONE TABLET AT BEDTIME.  . Multiple Vitamins-Minerals (CENTRUM SILVER PO) Take 1 tablet by mouth daily.  Marland Kitchen MURO 128 5 % ophthalmic ointment daily.  . nitroGLYCERIN (NITROSTAT) 0.4 MG SL tablet Place 1 tablet (0.4 mg total) under the tongue every 5 (five) minutes x 3 doses as needed for chest pain.  . pantoprazole (PROTONIX) 40 MG tablet TAKE 1 TABLET ONCE DAILY.  Marland Kitchen PROLENSA 0.07 % SOLN Place 1 drop into the left eye at bedtime.  . rosuvastatin (CRESTOR) 40 MG tablet TAKE 1 TABLET ONCE DAILY.  . vitamin B-12 (CYANOCOBALAMIN) 1000 MCG tablet Take 1 tablet (1,000 mcg total) by mouth daily.    Allergies  Allergen Reactions  . Allopurinol Other (See Comments)    Bone marrow suppression  . Asacol [Mesalamine] Hives    Trouble swallowing, loss of appetite  . Mercaptopurine Other (See Comments)    Bone marrow suppression  . Amoxicillin Rash    Did it involve swelling of the face/tongue/throat, SOB, or low BP? Unknown Did it involve sudden or severe rash/hives, skin peeling, or any reaction on the inside of your mouth or nose? Unknown Did you need to seek medical attention at a hospital or doctor's office? Unknown When did it last happen?b/c of penicillin allergy If all  above answers are "NO", may proceed with cephalosporin use.   Marland Kitchen Penicillins Rash    Did it involve swelling of the face/tongue/throat, SOB, or low BP? Yes Did it involve sudden or severe rash/hives, skin peeling, or any reaction on the inside of your mouth or nose? No Did you need to seek medical attention at a hospital or doctor's office? No When did it last happen?more than 15 yrs If all above answers are "NO", may proceed with cephalosporin use.   . Sulfamethoxazole Rash    Social History   Tobacco Use  . Smoking status: Never Smoker  . Smokeless tobacco: Never Used  Vaping Use  . Vaping Use: Never used  Substance Use Topics  . Alcohol use: Yes    Alcohol/week: 0.0 standard drinks    Comment: rare  . Drug use: No   Social History   Social History Narrative  . Not on file    family history includes Diabetes in his brother and father; Heart attack in his father; Heart disease in his father; Hypertension in his father.  Wt Readings from Last 3 Encounters:  11/24/19 184 lb 3.2 oz (83.6 kg)  11/08/19 183 lb 4 oz (83.1 kg)  05/24/19 181 lb 6.4 oz (82.3 kg)    PHYSICAL EXAM BP 112/60   Pulse 72   Ht 5' 6.5" (1.689 m)   Wt 184 lb 3.2 oz (83.6 kg)   SpO2 96%   BMI 29.29 kg/m  Physical Exam Vitals reviewed.  Constitutional:      General: He is not in acute distress.    Appearance: Normal appearance. He is well-developed. He is not ill-appearing.     Comments: Healthy-appearing.  Well-groomed.  Borderline obese  HENT:     Head: Normocephalic and atraumatic.  Neck:  Thyroid: No thyromegaly.     Vascular: No carotid bruit, hepatojugular reflux or JVD.  Cardiovascular:     Rate and Rhythm: Normal rate and regular rhythm.     Pulses: Normal pulses and intact distal pulses.     Heart sounds: Normal heart sounds. No murmur heard.  No friction rub. No gallop.      Comments: Nondisplaced PMI.  No ectopy Pulmonary:     Effort: Pulmonary effort is normal. No  respiratory distress.     Breath sounds: Normal breath sounds. No wheezing or rales.  Abdominal:     General: Bowel sounds are normal. There is no distension.     Palpations: Abdomen is soft. There is no mass (No HSM).     Tenderness: There is no abdominal tenderness.  Musculoskeletal:        General: No swelling. Normal range of motion.     Cervical back: Normal range of motion and neck supple.  Neurological:     General: No focal deficit present.     Mental Status: He is alert and oriented to person, place, and time.  Psychiatric:        Mood and Affect: Mood normal.        Behavior: Behavior normal.        Thought Content: Thought content normal.        Judgment: Judgment normal.   Shift  Adult ECG Report Not checked  Other studies Reviewed: Additional studies/ records that were reviewed today include:  Recent Labs:   Lab Results  Component Value Date   HGBA1C 5.9 (H) 11/08/2019    CBC Latest Ref Rng & Units 11/08/2019 01/04/2019 10/28/2018  WBC 3.8 - 10.8 Thousand/uL 6.4 5.4 5.8  Hemoglobin 13.2 - 17.1 g/dL 15.5 14.4 12.3(L)  Hematocrit 38 - 50 % 45.9 42.4 36.1(L)  Platelets 140 - 400 Thousand/uL 242 218.0 181.0   Lab Results  Component Value Date   CREATININE 1.07 11/08/2019   BUN 18 11/08/2019   NA 140 11/08/2019   K 4.1 11/08/2019   CL 106 11/08/2019   CO2 26 11/08/2019   Lab Results  Component Value Date   CHOL 162 11/08/2019   HDL 72 11/08/2019   LDLCALC 63 11/08/2019   TRIG 198 (H) 11/08/2019   CHOLHDL 2.3 11/08/2019    ASSESSMENT / PLAN: Problem List Items Addressed This Visit    None      COVID-19 Education: The signs and symptoms of COVID-19 were discussed with the patient and how to seek care for testing (follow up with PCP or arrange E-visit).   The importance of social distancing was discussed today.  I spent a total of 28 minutes with the patient and chart review. >  50% of the time was spent in direct patient consultation.  Additional  time was spent charting.  Current medicines are reviewed at length with the patient today.  (+/- concerns) N/A The following changes have been made:  See below  Patient Instructions  Medication Instructions:  Continue current medications  *If you need a refill on your cardiac medications before your next appointment, please call your pharmacy*   Lab Work: None Ordered   Testing/Procedures: None ordered   Follow-Up: At [facility], you and your health needs are our priority.  As part of our continuing mission to provide you with exceptional heart care, we have created designated Provider Care Teams.  These Care Teams include your primary Cardiologist (physician) and Advanced Practice Providers (APPs -  Physician Assistants and Nurse Practitioners) who all work together to provide you with the care you need, when you need it.  We recommend signing up for the patient portal called "MyChart".  Sign up information is provided on this After Visit Summary.  MyChart is used to connect with patients for Virtual Visits (Telemedicine).  Patients are able to view lab/test results, encounter notes, upcoming appointments, etc.  Non-urgent messages can be sent to your provider as well.   To learn more about what you can do with MyChart, go to [email or web address].    Your next appointment:   1 year(s)  The format for your next appointment:   In Person  Provider:   You may see Glenetta Hew, MD or one of the following Advanced Practice Providers on your designated Care Team:    Rosaria Ferries, PA-C  [name], DNP, ANP  Cadence Kathlen Mody, PA-C       Studies Ordered:   No orders of the defined types were placed in this encounter.   Glenetta Hew, M.D., M.S. Interventional Cardiologist   Pager # [phone] Phone # [phone] [address]. [place], [place] [address]   Thank you for choosing [facility] at [facility]!!

## 2019-11-24 NOTE — Patient Instructions (Addendum)
Medication Instructions:  Continue current medications  *If you need a refill on your cardiac medications before your next appointment, please call your pharmacy*   Lab Work: None Ordered   Testing/Procedures: None ordered   Follow-Up: At [facility], you and your health needs are our priority.  As part of our continuing mission to provide you with exceptional heart care, we have created designated Provider Care Teams.  These Care Teams include your primary Cardiologist (physician) and Advanced Practice Providers (APPs -  Physician Assistants and Nurse Practitioners) who all work together to provide you with the care you need, when you need it.  We recommend signing up for the patient portal called "MyChart".  Sign up information is provided on this After Visit Summary.  MyChart is used to connect with patients for Virtual Visits (Telemedicine).  Patients are able to view lab/test results, encounter notes, upcoming appointments, etc.  Non-urgent messages can be sent to your provider as well.   To learn more about what you can do with MyChart, go to [email or web address].    Your next appointment:   1 year(s)  The format for your next appointment:   In Person  Provider:   You may see Glenetta Hew, MD or one of the following Advanced Practice Providers on your designated Care Team:    Rosaria Ferries, PA-C  [name], DNP, ANP  Cadence Kathlen Mody, PA-C

## 2019-12-01 ENCOUNTER — Other Ambulatory Visit: Payer: Self-pay | Admitting: Internal Medicine

## 2019-12-01 ENCOUNTER — Encounter: Payer: Self-pay | Admitting: Cardiology

## 2019-12-01 NOTE — Progress Notes (Signed)
Patient ID: Darryl [name], male   DOB: 12/28/1950, 69 y.o.   MRN: [id number]   Primary Care Provider: Janith Lima, MD Cardiologist: Glenetta Hew, MD Electrophysiologist: None  Clinic Note: Chief Complaint  Patient presents with  . Follow-up    Doing well.  No major complaints.  . Coronary Artery Disease    No angina  . Cardiomyopathy    Improved EF on echo, no heart failure.   HPI:    Darryl [name] is a 69 y.o. male with a PMH notable for ACUTE INFERIOR STEMI in April 2020 (PCI to RCA with staged PCI to RI, CTO of LAD) with mild ICM (initially with LV thrombus), ORTHOSTATIC HYPOTENSION, and HLD, who presents today for 72-monthfollow-up.  2 months following his MI he was admitted for significant anemia with hemoglobin down to 5.0.  Thought to be related to azathioprine as opposed to bleed.  Was guaiac negative.    Was having issues with orthostatic hypotension and was started on midodrine  Started back on aspirin and Brilinta--plan to subsequently converted to Plavix when he was found to have left ventricular apical thrombus on echocardiogram.    Was treated with warfarin plus Plavix until follow-up echo January 2021 showed no further LV thrombus.  Warfarin discontinued -> on Plavix alone.  Darryl MONTENEGROwas last seen on May 24, 2019.  He was doing wonderful.  Very active.  Was going to the gym 2 to 3 days a week.  Doing stretching exercise as well as strength conditioning.  Just that every day was walking at least 1 to 2 miles a day.  Also playing golf a couple times a week.  Also then plan to get back on his bicycle do both inside and outside bike riding.  No further chest pain and no real noting any palpitations.  Class I CHF symptoms.  Crohn's was stable.  Orthostatic dizziness notably improved.  Not using daily midodrine.  Maybe 3 doses.  I started low-dose losartan-PCP converted to more appropriate irbesartan for better blood pressure control  Follow-up labs led to the  initiation of Vascepa for elevated triglycerides.  Recent Hospitalizations: None  Reviewed  CV studies:    The following studies were reviewed today: (if available, images/films reviewed: From Epic Chart or Care Everywhere) . None:  Interval History:   Darryl KINCERreturns here today very stable from cardiac standpoint.  He is happy that he is back out riding his bike at least 3-4 times a week.  He is also walking 1 to 2 miles a day.  He is not really having any more orthostatic symptoms.  Blood pressures been stable.  He cannot member the last time he used midodrine.  He is a little upset that has not lost any weight because of all the exercise, but does acknowledge some dietary discretion.  He had cataract surgeries on both eyes and was off exercising for little while.  He probably had some dietary discretion at that time.  He has not had any angina or heart failure symptoms.  No rapid irregular heartbeats or palpitations.  No CHF symptoms.  CV Review of Symptoms (Summary) Cardiovascular ROS: no chest pain or dyspnea on exertion negative for - edema, irregular heartbeat, orthopnea, palpitations, paroxysmal nocturnal dyspnea, rapid heart rate, shortness of breath or Syncope/near syncope, TIA/amaurosis fugax, claudication.  Melena, hematochezia, hematuria or epistaxis.  The patient does not have symptoms concerning for COVID-19 infection (fever, chills, cough, or new shortness of breath).  REVIEWED OF SYSTEMS   Review of Systems  Constitutional: Negative for malaise/fatigue and weight loss (He is trying to do a good job eating but just not doing as well despite his exercise.).  HENT: Negative for congestion.   Eyes:       Has had bilateral cataract surgery-of his vision is much better-clear  Respiratory: Negative for shortness of breath.   Cardiovascular: Negative for leg swelling.  Gastrointestinal: Negative for blood in stool, constipation, diarrhea and melena.       Was  breakthroughs on Humira.  Genitourinary: Negative for hematuria.  Musculoskeletal: Positive for joint pain (Mild arthritis pains). Negative for myalgias.  Neurological: Negative for dizziness and headaches.  Psychiatric/Behavioral: Negative for memory loss. The patient has insomnia (Has a hard time staying asleep and then when he wakes up cannot go back to sleep.  Has a hard time turning his mind off once he is awake.). The patient is not nervous/anxious.    I have reviewed and (if needed) personally updated the patient's problem list, medications, allergies, past medical and surgical history, social and family history.   PAST MEDICAL HISTORY   Past Medical History:  Diagnosis Date  . Allergic rhinitis, cause unspecified   . Anal stenosis   . Asthma   . Cataract   . Chronic orthostatic hypotension    Related to borderline hypotension; intolerant of blood pressure medications.  On Midodrine  . Coronary artery disease involving native coronary artery of native heart without angina pectoris 06/2018    p-mRCA 100% (DES PCI - p-mRCA 100% (DES PCI - Resolute Onyx 3.5 x 26 --> ~3.7 mm)), dRCA ~50% & PAV 40%; pRI 85% (Staged DES PCI  Resolute Onyx 2.25 x 15 - ~2.5 mm). 100% LAD CTO after small D1 with ost-prox 60%. R-L collaterals to LAD seen post PCI.  Marland Kitchen Crohn's colitis ([place])   . Exostosis of unspecified site   . GERD (gastroesophageal reflux disease)   . History of acute inferior wall MI 06/2018   100% p-mRCA -- DES PCI (complicated by mild cardiogenic shock, partially related to 100% LAD CTO) --> staged PCI-RI  . Hyperlipidemia with target LDL less than 70 07/04/2015   Now s/p MI with MV CAD - optimal LDL < 50  . Hyperplastic colon polyp 02/26/2008  . Ischemic cardiomyopathy    Initial echo shows EF of 40 to 45% with anterior hypokinesis. Cardiac MRI shows EF of 49% with anterior anteroapical hypokinesis/akinesis and LV thrombus.  . Lateral epicondylitis  of elbow   . Left ventricular  thrombus following MI [facility]) ==> resolved as of January 2021 10/10/2018   Cardiac MRI 09/28/2018: Mildly dilated LV.  EF~ 49%.  Mid Anteriopr/Anteroseptal & Apical Anterior HK.  LV APICAL THROMBUS (partially layered) 22 x 17 x 11. ==> b) f/u Echo 03/2019 -No LV Thrombus (NO LONGER ON WARFARIN)  . Leukopenia   . Macrocytic anemia    Related to azathioprine  . Nontraumatic rupture of patellar tendon   . Obesity, unspecified   . Regional enteritis of unspecified site   . Seasonal allergies   . Sessile colonic polyp   . Vitamin B12 deficiency     PAST SURGICAL HISTORY   Past Surgical History:  Procedure Laterality Date  . CARDIAC MRI  09/2018   Mildly dilated LV.  EF estimated roughly 49%.  Hypokinesis of the mid anterior, anteroseptal and apical anterior wall.  There is also apical thrombus (partially layered) 22 x 17 x 11. -->  50-75% late  gadolinium enhancement of the mid anterior, anteroseptal and apical anterior and septal walls suggestive of near full-thickness scar w/ minimal viability (poor recovery prognosis w/ REVASCULARIZATION  . COLONOSCOPY     2016  . CORONARY STENT INTERVENTION N/A 07/24/2018   Procedure: CORONARY STENT INTERVENTION;  Surgeon: Troy Sine, MD;  Location: [place] [facility];  Service: Cardiovascular: Staged PCI pRI 85-90%: Resolute Onyx DES 2.25 x 15 (2.5 mm)  . CORONARY/GRAFT ACUTE MI REVASCULARIZATION N/A 07/23/2018   Procedure: CORONARY/GRAFT ACUTE MI REVASCULARIZATION;  Surgeon: [name], MD;  Location: [place] [facility];  Service: Cardiovascular;::  p-mRCA 100% (DES PCI - p-mRCA 100% (DES PCI - Resolute Onyx 3.5 x 26 (post-dilated ~3.7 mm)), dRCA ~50% & PAV 40%; pRI 85% (Staged PCI). 100% LAD CTO after small D1 with ost-prox 60%. R-L collaterals to LAD seen post PCI.;   . LEFT HEART CATH N/A 07/24/2018   Procedure: LEFT HEART CATH;  Surgeon: Troy Sine, MD;  Location: [place] [facility];  Service: Cardiovascular;  Laterality: N/A; 85-90% pRCA, 60% ost  D1 then 100% CTO of LAD -- staged DES PCI LAD  . LEFT HEART CATH AND CORONARY ANGIOGRAPHY  07/23/2018   Procedure: LEFT HEART CATH;  Surgeon: [name]., MD;  Location: [facility]   p-mRCA 100% (DES PCI),dRCA ~50% & PAV 40%; pRI 85% (Staged PCI). 100% LAD CTO after small D1 with ost-prox 60%. R-L collaterals to LAD seen post PCI.;   . RIGHT HEART CATH N/A 07/23/2018   Procedure: RIGHT HEART CATH;  Surgeon: [name], MD;  Location: [place] [facility];  Service: Cardiovascular;  Normal RHC #s, CO/CI normal  . TONSILLECTOMY AND ADENOIDECTOMY    . TRANSTHORACIC ECHOCARDIOGRAM  07/23/2018   Insetting of inferior MI with occluded LAD CTO; --Severe akinesis of the mid anteroseptal wall.  EF 40 and 45%.  GR 1 DD.;;    . TRANSTHORACIC ECHOCARDIOGRAM  04/06/2019   To follow-up EF and LV thrombus: EF 40 and 45%. Distal septal and apical akinesis. No LV thrombus noted. Mild LVH. GR 1 DD. Normal RV. Mild LA dilation. Normal RA. Mild aortic valve sclerosis with no stenosis - mild AI  --> warfarin discontinued.  Continued on Plavix alone.   Diagnostic #1: 07/23/2018 Dominance: Right   After staged PCI -07/24/2018    Immunization History  Administered Date(s) Administered  . Fluad Quad(high Dose 65+) 01/21/2019  . Hepb-cpg 10/12/2018, 11/13/2018  . Influenza, High Dose Seasonal PF 01/07/2017, 02/23/2018  . Influenza,inj,Quad PF,6+ Mos 02/05/2013  . PFIZER SARS-COV-2 Vaccination 05/01/2019, 05/22/2019, 11/23/2019  . PPD Test 11/26/2011  . Pneumococcal Conjugate-13 07/04/2015  . Pneumococcal Polysaccharide-23 05/19/2013, 02/04/2019  . Tdap 05/19/2013    MEDICATIONS/ALLERGIES   Current Meds  Medication Sig  . albuterol (VENTOLIN HFA) 108 (90 Base) MCG/ACT inhaler Inhale 2 puffs into the lungs every 6 (six) hours as needed for wheezing.  . cholecalciferol (VITAMIN D3) 25 MCG (1000 UT) tablet Take 1,000 Units by mouth daily.  . clopidogrel (PLAVIX) 75 MG tablet Take 1  tablet (75 mg total) by mouth daily.  . Coenzyme Q10 (CO Q 10 PO) Take 100 mg by mouth.  . DUREZOL 0.05 % EMUL   . fluticasone-salmeterol (ADVAIR HFA) 115-21 MCG/ACT inhaler Inhale 2 puffs into the lungs 2 (two) times daily.  . folic acid (FOLVITE) 1 MG tablet TAKE 1 TABLET ONCE DAILY. (Patient taking differently: Take 1 mg by mouth daily. )  . gatifloxacin (ZYMAXID) 0.5 %  SOLN Place 1 drop into the left eye 4 (four) times daily.  Marland Kitchen HUMIRA PEN 40 MG/0.4ML PNKT every 14 (fourteen) days.   . hydrocortisone 2.5 % cream Apply 1 application topically daily.   Marland Kitchen icosapent Ethyl (VASCEPA) 1 g capsule Take 2 capsules (2 g total) by mouth 2 (two) times daily.  . irbesartan (AVAPRO) 75 MG tablet Take 1 tablet (75 mg total) by mouth daily.  Marland Kitchen loperamide (IMODIUM A-D) 2 MG tablet Take 2 mg by mouth 4 (four) times daily as needed for diarrhea or loose stools.  . metoprolol succinate (TOPROL XL) 25 MG 24 hr tablet Take 1 tablet (25 mg total) by mouth daily.  . midodrine (PROAMATINE) 2.5 MG tablet TAKE 1 TAB IN AM. MAY TAKE AN ADDITIONAL TAB AFTER 6HR IF STILL DIZZY/SYSTOLIC DP<82  . montelukast (SINGULAIR) 10 MG tablet TAKE ONE TABLET AT BEDTIME.  . Multiple Vitamins-Minerals (CENTRUM SILVER PO) Take 1 tablet by mouth daily.  Marland Kitchen MURO 128 5 % ophthalmic ointment daily.  . nitroGLYCERIN (NITROSTAT) 0.4 MG SL tablet Place 1 tablet (0.4 mg total) under the tongue every 5 (five) minutes x 3 doses as needed for chest pain.  . pantoprazole (PROTONIX) 40 MG tablet TAKE 1 TABLET ONCE DAILY.  Marland Kitchen PROLENSA 0.07 % SOLN Place 1 drop into the left eye at bedtime.  . rosuvastatin (CRESTOR) 40 MG tablet TAKE 1 TABLET ONCE DAILY.  . vitamin B-12 (CYANOCOBALAMIN) 1000 MCG tablet Take 1 tablet (1,000 mcg total) by mouth daily.    Allergies  Allergen Reactions  . Allopurinol Other (See Comments)    Bone marrow suppression  . Asacol [Mesalamine] Hives    Trouble swallowing, loss of appetite  . Mercaptopurine Other (See  Comments)    Bone marrow suppression  . Amoxicillin Rash    Did it involve swelling of the face/tongue/throat, SOB, or low BP? Unknown Did it involve sudden or severe rash/hives, skin peeling, or any reaction on the inside of your mouth or nose? Unknown Did you need to seek medical attention at a hospital or doctor's office? Unknown When did it last happen?b/c of penicillin allergy If all above answers are "NO", may proceed with cephalosporin use.   Marland Kitchen Penicillins Rash    Did it involve swelling of the face/tongue/throat, SOB, or low BP? Yes Did it involve sudden or severe rash/hives, skin peeling, or any reaction on the inside of your mouth or nose? No Did you need to seek medical attention at a hospital or doctor's office? No When did it last happen?more than 15 yrs If all above answers are "NO", may proceed with cephalosporin use.   . Sulfamethoxazole Rash    SOCIAL HISTORY/FAMILY HISTORY   Reviewed in Epic:  Pertinent findings:  .-->  Exercises daily with 1 to 2 mile walk, 3 days a week goes to the gym doing strength conditioning, now riding either stationary or after bike 3 to 4 days a week for least 30 to 40 minutes.  OBJCTIVE -PE, EKG, labs   Wt Readings from Last 3 Encounters:  11/24/19 184 lb 3.2 oz (83.6 kg)  11/08/19 183 lb 4 oz (83.1 kg)  05/24/19 181 lb 6.4 oz (82.3 kg)    Physical Exam: BP 112/60   Pulse 72   Ht 5' 6.5" (1.689 m)   Wt 184 lb 3.2 oz (83.6 kg)   SpO2 96%   BMI 29.29 kg/m  Physical Exam Vitals reviewed.  Constitutional:      General: He is not in  acute distress.    Appearance: Normal appearance. He is not ill-appearing.     Comments: Healthy-appearing.  Well-groomed.  Borderline obese  HENT:     Head: Normocephalic and atraumatic.  Neck:     Vascular: No carotid bruit.  Cardiovascular:     Rate and Rhythm: Normal rate and regular rhythm.     Pulses: Normal pulses.     Heart sounds: Normal heart sounds. No murmur heard.  No  friction rub. No gallop.   Pulmonary:     Effort: Pulmonary effort is normal. No respiratory distress.     Breath sounds: Normal breath sounds.  Musculoskeletal:        General: No swelling. Normal range of motion.     Cervical back: Normal range of motion and neck supple. No rigidity.  Neurological:     General: No focal deficit present.     Mental Status: He is alert and oriented to person, place, and time.  Psychiatric:        Mood and Affect: Mood normal.        Behavior: Behavior normal.        Thought Content: Thought content normal.        Judgment: Judgment normal.      Adult ECG Report N/A  Recent Labs:    Lab Results  Component Value Date   CHOL 162 11/08/2019   HDL 72 11/08/2019   LDLCALC 63 11/08/2019   TRIG 198 (H) 11/08/2019   CHOLHDL 2.3 11/08/2019   Lab Results  Component Value Date   CREATININE 1.07 11/08/2019   BUN 18 11/08/2019   NA 140 11/08/2019   K 4.1 11/08/2019   CL 106 11/08/2019   CO2 26 11/08/2019   Lab Results  Component Value Date   TSH 1.16 11/08/2019    ASSESSMENT/PLAN    Problem List Items Addressed This Visit    Coronary artery disease involving native coronary artery of native heart without angina pectoris (Chronic)    Multivessel CAD-CTO of LAD and PCI to the RCA and RI.  The RCA does provide collateral to the LAD distribution.  Mild to moderate reduced EF on echo.  No further anginal symptoms.  Plan: Tolerating low-dose Toprol along with rosuvastatin and Plavix.  Now on Vascepa and irbesartan      CAD S/P percutaneous coronary angioplasty (Chronic)    Multivessel PCI with occluded LAD.  No longer on warfarin for LV apical thrombus. With significant multivessel disease we will continue on maintenance dose Plavix 75 mg daily.  He is now beyond 1 year post MI and is okay to hold Plavix 5 to 7 days preop for procedures.      Essential hypertension (Chronic)    Truthfully does not have hypertension.  He is on Toprol and  irbesartan for his cardiomyopathy and tolerating it well.  Just recently discontinued standard use of midodrine.      Hyperlipidemia with target LDL less than 70 (Chronic)    Most recent LDL was 63.  Pretty close to goal but we would like.  Triglycerides were 198 and he is now on Vascepa.  Hopefully this will continue to bring both the LDL and triglycerides down further. Tolerating statin plus Vascepa relatively well without myalgias.      Ischemic cardiomyopathy (Chronic)    Notable improvement in EF from initial MI.  EF up to 40 to 45%.  There is a large anterior defect, with the LAD occlusion.  Cardiac MRI did not show much  viability in this distribution. Really only having class I if that CHF symptoms.  Plan: Continue Toprol plus ARB.  He is euvolemic and not requiring diuretic.      Hypertriglyceridemia (Chronic)    Now on Vascepa plus statin        COVID-19 Education: The signs and symptoms of COVID-19 were discussed with the patient and how to seek care for testing (follow up with PCP or arrange E-visit).   The importance of social distancing and COVID-19 vaccination was discussed today. 2 min -> he actually just had his booster shot (he wanted to get it prior to upcoming travel) The patient is practicing social distancing & Masking.   I spent a total of 28 minutes with the patient spent in direct patient consultation.  Additional time spent with chart review  / charting (studies, outside notes, etc): 14 Total Time: 42 min  Current medicines are reviewed at length with the patient today.  (+/- concerns) none  Notice: This dictation was prepared with Dragon dictation along with smaller phrase technology. Any transcriptional errors that result from this process are unintentional and may not be corrected upon review.  Patient Instructions / Medication Changes & Studies & Tests Ordered   Patient Instructions  Medication Instructions:  Continue current medications  *If you  need a refill on your cardiac medications before your next appointment, please call your pharmacy*   Lab Work: None Ordered   Testing/Procedures: None ordered   Follow-Up: At [facility], you and your health needs are our priority.  As part of our continuing mission to provide you with exceptional heart care, we have created designated Provider Care Teams.  These Care Teams include your primary Cardiologist (physician) and Advanced Practice Providers (APPs -  Physician Assistants and Nurse Practitioners) who all work together to provide you with the care you need, when you need it.  We recommend signing up for the patient portal called "MyChart".  Sign up information is provided on this After Visit Summary.  MyChart is used to connect with patients for Virtual Visits (Telemedicine).  Patients are able to view lab/test results, encounter notes, upcoming appointments, etc.  Non-urgent messages can be sent to your provider as well.   To learn more about what you can do with MyChart, go to [email or web address].    Your next appointment:   1 year(s)  The format for your next appointment:   In Person  Provider:   You may see Glenetta Hew, MD or one of the following Advanced Practice Providers on your designated Care Team:    Rosaria Ferries, PA-C  [name], DNP, ANP  Cadence Kathlen Mody, PA-C     Studies Ordered:   No orders of the defined types were placed in this encounter.    Glenetta Hew, M.D., M.S. Interventional Cardiologist   Pager # [phone] Phone # [phone] [address] [place], [place] [address]   Thank you for choosing [facility] at [facility]!!

## 2019-12-01 NOTE — Assessment & Plan Note (Signed)
Truthfully does not have hypertension.  He is on Toprol and irbesartan for his cardiomyopathy and tolerating it well.  Just recently discontinued standard use of midodrine.

## 2019-12-01 NOTE — Assessment & Plan Note (Addendum)
Multivessel CAD-CTO of LAD and PCI to the RCA and RI.  The RCA does provide collateral to the LAD distribution.  Mild to moderate reduced EF on echo.  No further anginal symptoms.  Plan: Tolerating low-dose Toprol along with rosuvastatin and Plavix.  Now on Vascepa and irbesartan

## 2019-12-01 NOTE — Assessment & Plan Note (Signed)
Now on Vascepa plus statin

## 2019-12-01 NOTE — Assessment & Plan Note (Signed)
Notable improvement in EF from initial MI.  EF up to 40 to 45%.  There is a large anterior defect, with the LAD occlusion.  Cardiac MRI did not show much viability in this distribution. Really only having class I if that CHF symptoms.  Plan: Continue Toprol plus ARB.  He is euvolemic and not requiring diuretic.

## 2019-12-01 NOTE — Assessment & Plan Note (Signed)
Multivessel PCI with occluded LAD.  No longer on warfarin for LV apical thrombus. With significant multivessel disease we will continue on maintenance dose Plavix 75 mg daily.  He is now beyond 1 year post MI and is okay to hold Plavix 5 to 7 days preop for procedures.

## 2019-12-01 NOTE — Assessment & Plan Note (Signed)
Most recent LDL was 63.  Pretty close to goal but we would like.  Triglycerides were 198 and he is now on Vascepa.  Hopefully this will continue to bring both the LDL and triglycerides down further. Tolerating statin plus Vascepa relatively well without myalgias.

## 2019-12-04 ENCOUNTER — Other Ambulatory Visit: Payer: Self-pay | Admitting: Internal Medicine

## 2019-12-17 ENCOUNTER — Telehealth: Payer: Self-pay | Admitting: Cardiology

## 2019-12-17 NOTE — Telephone Encounter (Signed)
Patient called with recommendations  Voiced understanding Med list updated Advised to call or send MyChart message w/readings after 2 weeks

## 2019-12-17 NOTE — Telephone Encounter (Signed)
Pt c/o medication issue:  1. Name of Medication:   Losartan was replaced by Irbesartan  2. How are you currently taking this medication (dosage and times per day)? 1 time a day  3. Are you having a reaction (difficulty breathing--STAT)? no  4. What is your medication issue? Blood pressure is running low, lightheaded and feels tired

## 2019-12-17 NOTE — Telephone Encounter (Signed)
Patient reports losartan was changed to irbesartan mid-August by PCP He reports BP readings are always better at home than in doctor's offices - possible white-coat HTN Patient saw Dr. Ellyn Hack after med change was made and Dr. Ellyn Hack did not make any additional changes, agreed w/PCP plan.  Last week, patient went on trip to [place], [place] - he reports with the altitude changes, weather changes he did fine with all activities. He returned to work Monday morning feeling lethargic. His SBP was 98 on Monday. He reports SBP has been consistently in the 90s. He took PRN midodrine each day and BP improved slightly but he still reports lethargy.   Advised will send message to MD/CVRR HTN clinic team to review and advise

## 2019-12-17 NOTE — Telephone Encounter (Signed)
Recommendation:  1. Decrease irbesartan to 1/2 tablet daily 2. Stay hydrated and monitoring BP daily for 2 weeks 3. Call back if continue to have problems and we can schedule follow up with HTN clinic if needed.

## 2019-12-20 ENCOUNTER — Other Ambulatory Visit: Payer: Self-pay | Admitting: Cardiology

## 2020-02-01 ENCOUNTER — Other Ambulatory Visit: Payer: Medicare PPO

## 2020-02-01 ENCOUNTER — Encounter: Payer: Self-pay | Admitting: Internal Medicine

## 2020-02-01 ENCOUNTER — Ambulatory Visit: Payer: Medicare PPO | Admitting: Internal Medicine

## 2020-02-01 ENCOUNTER — Telehealth: Payer: Self-pay | Admitting: *Deleted

## 2020-02-01 VITALS — BP 140/90 | HR 83 | Ht 66.5 in | Wt 185.0 lb

## 2020-02-01 DIAGNOSIS — K5010 Crohn's disease of large intestine without complications: Secondary | ICD-10-CM

## 2020-02-01 DIAGNOSIS — Z23 Encounter for immunization: Secondary | ICD-10-CM | POA: Diagnosis not present

## 2020-02-01 DIAGNOSIS — E538 Deficiency of other specified B group vitamins: Secondary | ICD-10-CM

## 2020-02-01 DIAGNOSIS — K219 Gastro-esophageal reflux disease without esophagitis: Secondary | ICD-10-CM

## 2020-02-01 MED ORDER — SUTAB 1479-225-188 MG PO TABS: 1.0000 | ORAL_TABLET | ORAL | 0 refills | Status: DC

## 2020-02-01 NOTE — Telephone Encounter (Signed)
   Primary Cardiologist: Glenetta Hew, MD  Chart reviewed as part of pre-operative protocol coverage.   Per previous recommendation by Dr. Ellyn Hack and given lack of interval change in cardiac history, patient can hold plavix 5 days prior to his upcoming colonoscopy. Plavix should be restarted as soon as he is cleared to do so by his gastroenterologist.  I will route this recommendation to the requesting party via Epic fax function and remove from pre-op pool.  Please call with questions.  Abigail Butts, PA-C 02/01/2020, 2:34 PM

## 2020-02-01 NOTE — Patient Instructions (Addendum)
You have been scheduled for a colonoscopy. Please follow written instructions given to you at your visit today.  Please pick up your prep supplies at the pharmacy within the next 1-3 days. If you use inhalers (even only as needed), please bring them with you on the day of your procedure.  Continue Humira.  Continue pantoprazole.  Your provider has requested that you go to the basement level for lab work before leaving today. Press "B" on the elevator. The lab is located at the first door on the left as you exit the elevator.  If you are age 29 or older, your body mass index should be between 23-30. Your Body mass index is 29.41 kg/m. If this is out of the aforementioned range listed, please consider follow up with your Primary Care Provider.  Due to recent changes in healthcare laws, you may see the results of your imaging and laboratory studies on MyChart before your provider has had a chance to review them.  We understand that in some cases there may be results that are confusing or concerning to you. Not all laboratory results come back in the same time frame and the provider may be waiting for multiple results in order to interpret others.  Please give [place] 48 hours in order for your provider to thoroughly review all the results before contacting the office for clarification of your results.

## 2020-02-01 NOTE — Progress Notes (Signed)
Subjective:    Patient ID: Darryl [name], male    DOB: Apr 02, 1950, 69 y.o.   MRN: [id number]  HPI Darryl [name] is a 69 yo male with PMH of Crohn's colitis for greater than 2 decades previously managed on 6-MP but stopped due to pancytopenia currently on Humira (started summer 2020) every 14 days, GERD, history of sessile serrated colon polyps, B12 deficiency who is here for follow-up.  He is here alone today.  He reports that he has been doing well from a GI perspective.  Humira continues every 14 days with last dose 01/20/2020.  He is tolerating this well.  Earlier in the week he had 2 days of left-sided abdominal pain which is now resolved completely.  This is atypical for him and actually his wife had the same symptoms.  They feel like they may have eaten spaghetti that was bad.  Bowel movements have been formed and regular usually about 2 times per day in the morning occasionally 3 times per day.  No blood in stool or melena.  No new rashes though he does have eczema longstanding.  He is looking for a new dermatologist though previously he was seeing Dr. Jarome Matin.  He has had COVID-19 booster.  In need of flu shot.  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   Review of Systems As per HPI, otherwise negative  Current Medications, Allergies, Past Medical History, Past Surgical History, Family History and Social History were reviewed in [employer] record.     Objective:   Physical Exam BP 140/90   Pulse 83   Ht 5' 6.5" (1.689 m)   Wt 185 lb (83.9 kg)   SpO2 94%   BMI 29.41 kg/m  Gen: awake, alert, NAD HEENT: anicteric, op clear CV: RRR, no mrg Pulm: CTA b/l Abd: soft, NT/ND, +BS throughout Ext: no c/c/e Neuro: nonfocal  CBC    Component Value Date/Time   WBC 6.4 11/08/2019 0913   RBC 4.70 11/08/2019 0913   HGB 15.5 11/08/2019 0913   HCT 45.9 11/08/2019 0913   PLT 242 11/08/2019 0913   MCV 97.7  11/08/2019 0913   MCH 33.0 11/08/2019 0913   MCHC 33.8 11/08/2019 0913   RDW 12.3 11/08/2019 0913   LYMPHSABS 1,645 11/08/2019 0913   MONOABS 0.7 01/04/2019 1015   EOSABS 32 11/08/2019 0913   BASOSABS 32 11/08/2019 0913   CMP     Component Value Date/Time   NA 140 11/08/2019 0913   NA 139 06/24/2019 0844   K 4.1 11/08/2019 0913   CL 106 11/08/2019 0913   CO2 26 11/08/2019 0913   GLUCOSE 112 (H) 11/08/2019 0913   BUN 18 11/08/2019 0913   BUN 18 06/24/2019 0844   CREATININE 1.07 11/08/2019 0913   CALCIUM 9.4 11/08/2019 0913   PROT 6.7 11/08/2019 0913   ALBUMIN 4.6 09/29/2018 1600   AST 24 11/08/2019 0913   ALT 28 11/08/2019 0913   ALKPHOS 82 09/29/2018 1600   BILITOT 0.6 11/08/2019 0913   GFRNONAA 70 11/08/2019 0913   GFRAA 81 11/08/2019 0913   Lab Results  Component Value Date   FKCLEXNT70 017 11/08/2019        Assessment & Plan:  69 yo male with PMH of Crohn's colitis for greater than 2 decades previously managed on 6-MP but stopped due to pancytopenia currently on Humira (started summer 2020) every 14 days, GERD, history of sessile serrated colon  polyps, B12 deficiency who is here for follow-up.   1.  Crohn's colitis --clinically he is doing very well.  He is tolerating Humira without issue and has reached clinical remission.  We will continue current therapy.  Surveillance endoscopy is due at this time --Continue Humira 40 mg every 14 days --Surveillance colonoscopy also being performed for history of adenomatous/sessile serrated colon polyps.  We discussed the risk, benefits and alternatives and he is agreeable wishes to proceed --Blood counts good when checked recently --Check QuantiFERON gold today --Hold Plavix 5 days before procedure - will instruct when and how to resume after procedure. Risks and benefits of procedure including bleeding, perforation, infection, missed lesions, medication reactions and possible hospitalization or surgery if complications occur  explained. Additional rare but real risk of cardiovascular event such as heart attack or ischemia/infarct of other organs off Plavix explained and need to seek urgent help if this occurs. Will communicate by phone or EMR with patient's prescribing provider that to confirm holding Plavix is reasonable in this case.   2.  B12 deficiency --on supplementation with normal B12 levels, continue current dose  3.  GERD --symptoms well controlled pantoprazole.  No alarm symptoms.  Continue 40 mg pantoprazole daily  4.  Eczema --I stressed the importance of routine dermatologic follow-up in light of his Humira therapy.    5.  Need for influenza vaccination --flu vaccine today  30 minutes total spent today including patient facing time, coordination of care, reviewing medical history/procedures/pertinent radiology studies, and documentation of the encounter.

## 2020-02-01 NOTE — Telephone Encounter (Signed)
I have spoken to patient to advise that per Dr Allison Quarry office, he may hold plavix 5 days prior to his upcoming colonoscopy procedure. Patient verbalizes understanding of this information.

## 2020-02-01 NOTE — Telephone Encounter (Signed)
Request for surgical clearance:     Endoscopy Procedure  What type of surgery is being performed?     colonoscopy  When is this surgery scheduled?     04/11/20  What type of clearance is required ?   Pharmacy  Are there any medications that need to be held prior to surgery and how long? Plavix, 5 days  Practice name and name of physician performing surgery?      [facility]  What is your office phone and fax number?      Phone- [phone]  [phone]  Anesthesia type (None, local, MAC, general) ?       MAC

## 2020-02-05 LAB — QUANTIFERON-TB GOLD PLUS
Mitogen-NIL: 9.32 IU/mL
NIL: 0.02 IU/mL
QuantiFERON-TB Gold Plus: NEGATIVE
TB1-NIL: 0 IU/mL
TB2-NIL: 0 IU/mL

## 2020-02-05 LAB — VITAMIN D 1,25 DIHYDROXY
Vitamin D 1, 25 (OH)2 Total: 40 pg/mL (ref 18–72)
Vitamin D2 1, 25 (OH)2: <8 pg/mL
Vitamin D3 1, 25 (OH)2: 40 pg/mL

## 2020-02-14 ENCOUNTER — Other Ambulatory Visit: Payer: Self-pay | Admitting: Internal Medicine

## 2020-02-14 DIAGNOSIS — J4520 Mild intermittent asthma, uncomplicated: Secondary | ICD-10-CM

## 2020-03-09 ENCOUNTER — Other Ambulatory Visit: Payer: Self-pay | Admitting: Internal Medicine

## 2020-03-29 ENCOUNTER — Other Ambulatory Visit: Payer: Self-pay | Admitting: Cardiology

## 2020-04-10 ENCOUNTER — Encounter: Payer: Self-pay | Admitting: Internal Medicine

## 2020-04-11 ENCOUNTER — Ambulatory Visit (AMBULATORY_SURGERY_CENTER): Payer: Medicare PPO | Admitting: Internal Medicine

## 2020-04-11 ENCOUNTER — Other Ambulatory Visit: Payer: Self-pay

## 2020-04-11 ENCOUNTER — Encounter: Payer: Self-pay | Admitting: Internal Medicine

## 2020-04-11 VITALS — BP 109/73 | HR 65 | Temp 97.0°F | Resp 18 | Ht 66.0 in | Wt 185.0 lb

## 2020-04-11 DIAGNOSIS — K5730 Diverticulosis of large intestine without perforation or abscess without bleeding: Secondary | ICD-10-CM | POA: Diagnosis not present

## 2020-04-11 DIAGNOSIS — K5140 Inflammatory polyps of colon without complications: Secondary | ICD-10-CM | POA: Diagnosis not present

## 2020-04-11 DIAGNOSIS — K529 Noninfective gastroenteritis and colitis, unspecified: Secondary | ICD-10-CM | POA: Diagnosis not present

## 2020-04-11 DIAGNOSIS — Z86010 Personal history of colonic polyps: Secondary | ICD-10-CM | POA: Diagnosis not present

## 2020-04-11 DIAGNOSIS — K5150 Left sided colitis without complications: Secondary | ICD-10-CM | POA: Diagnosis not present

## 2020-04-11 DIAGNOSIS — K5010 Crohn's disease of large intestine without complications: Secondary | ICD-10-CM | POA: Diagnosis not present

## 2020-04-11 DIAGNOSIS — D125 Benign neoplasm of sigmoid colon: Secondary | ICD-10-CM

## 2020-04-11 LAB — HM COLONOSCOPY

## 2020-04-11 MED ORDER — SODIUM CHLORIDE 0.9 % IV SOLN: 500.0000 mL | Freq: Once | INTRAVENOUS | Status: DC

## 2020-04-11 NOTE — Progress Notes (Signed)
To PACU, VSS. Report to [email or web address] 

## 2020-04-11 NOTE — Patient Instructions (Signed)
Please read handouts provided. Continue present medications. Resume Plavix ( clopidogrel ) at prior dose tomorrow. Await pathology results.      YOU HAD AN ENDOSCOPIC PROCEDURE TODAY AT [facility]:   Refer to the procedure report that was given to you for any specific questions about what was found during the examination.  If the procedure report does not answer your questions, please call your gastroenterologist to clarify.  If you requested that your care partner not be given the details of your procedure findings, then the procedure report has been included in a sealed envelope for you to review at your convenience later.  YOU SHOULD EXPECT: Some feelings of bloating in the abdomen. Passage of more gas than usual.  Walking can help get rid of the air that was put into your GI tract during the procedure and reduce the bloating. If you had a lower endoscopy (such as a colonoscopy or flexible sigmoidoscopy) you may notice spotting of blood in your stool or on the toilet paper. If you underwent a bowel prep for your procedure, you may not have a normal bowel movement for a few days.  Please Note:  You might notice some irritation and congestion in your nose or some drainage.  This is from the oxygen used during your procedure.  There is no need for concern and it should clear up in a day or so.  SYMPTOMS TO REPORT IMMEDIATELY:   Following lower endoscopy (colonoscopy or flexible sigmoidoscopy):  Excessive amounts of blood in the stool  Significant tenderness or worsening of abdominal pains  Swelling of the abdomen that is new, acute  Fever of 100F or higher  For urgent or emergent issues, a gastroenterologist can be reached at any hour by calling [phone]. Do not use MyChart messaging for urgent concerns.    DIET:  We do recommend a small meal at first, but then you may proceed to your regular diet.  Drink plenty of fluids but you should avoid alcoholic beverages  for 24 hours.  ACTIVITY:  You should plan to take it easy for the rest of today and you should NOT DRIVE or use heavy machinery until tomorrow (because of the sedation medicines used during the test).    FOLLOW UP: Our staff will call the number listed on your records 48-72 hours following your procedure to check on you and address any questions or concerns that you may have regarding the information given to you following your procedure. If we do not reach you, we will leave a message.  We will attempt to reach you two times.  During this call, we will ask if you have developed any symptoms of COVID 19. If you develop any symptoms (ie: fever, flu-like symptoms, shortness of breath, cough etc.) before then, please call [phone].  If you test positive for Covid 19 in the 2 weeks post procedure, please call and report this information to [place].    If any biopsies were taken you will be contacted by phone or by letter within the next 1-3 weeks.  Please call us at [phone] if you have not heard about the biopsies in 3 weeks.    SIGNATURES/CONFIDENTIALITY: You and/or your care partner have signed paperwork which will be entered into your electronic medical record.  These signatures attest to the fact that that the information above on your After Visit Summary has been reviewed and is understood.  Full responsibility of the confidentiality of this discharge information lies  with you and/or your care-partner.

## 2020-04-11 NOTE — Progress Notes (Signed)
Vs by CW

## 2020-04-11 NOTE — Progress Notes (Signed)
Called to room to assist during endoscopic procedure.  Patient ID and intended procedure confirmed with present staff. Received instructions for my participation in the procedure from the performing physician.  

## 2020-04-11 NOTE — Op Note (Signed)
[facility] Patient Name: Darryl [name] Procedure Date: 04/11/2020 11:45 AM MRN: [id number] Endoscopist: Jerene Bears , MD Age: 70 Referring MD:  Date of Birth: [birth year] Gender: Male Account #: [id number] Procedure:                Colonoscopy Indications:              High risk colon cancer surveillance: Crohn's                            colitis of 8 (or more) years duration with                            one-third (or more) of the colon involved, personal                            history of sessile serrated polyps, Last                            colonoscopy: July 2019 Medicines:                Monitored Anesthesia Care Procedure:                Pre-Anesthesia Assessment:                           - Prior to the procedure, a History and Physical                            was performed, and patient medications and                            allergies were reviewed. The patient's tolerance of                            previous anesthesia was also reviewed. The risks                            and benefits of the procedure and the sedation                            options and risks were discussed with the patient.                            All questions were answered, and informed consent                            was obtained. Prior Anticoagulants: The patient has                            taken Plavix (clopidogrel), last dose was 5 days                            prior to procedure. ASA Grade Assessment: III - A  patient with severe systemic disease. After                            reviewing the risks and benefits, the patient was                            deemed in satisfactory condition to undergo the                            procedure.                           After obtaining informed consent, the colonoscope                            was passed under direct vision. Throughout the                            procedure, the  patient's blood pressure, pulse, and                            oxygen saturations were monitored continuously. The                            Olympus CF-HQ190L ([id number]) Colonoscope was                            introduced through the anus and advanced to the                            terminal ileum. The colonoscopy was performed                            without difficulty. The patient tolerated the                            procedure well. The quality of the bowel                            preparation was good. The terminal ileum, ileocecal                            valve, appendiceal orifice, and rectum were                            photographed. Scope In: 12:08:55 PM Scope Out: 12:25:22 PM Scope Withdrawal Time: 0 hours 14 minutes 36 seconds  Total Procedure Duration: 0 hours 16 minutes 27 seconds  Findings:                 The digital rectal exam was normal.                           The terminal ileum appeared normal.  The Simple Endoscopic Score for Crohn's Disease was                            determined based on the endoscopic appearance of                            the mucosa in the following segments:                           - Ileum: Findings include no ulcers present, no                            ulcerated surfaces, no affected surfaces and no                            narrowings. Segment score: 0.                           - Right Colon: Findings include no ulcers present,                            no ulcerated surfaces, no affected surfaces and no                            narrowings. Segment score: 0.                           - Transverse Colon: Findings include no ulcers                            present, no ulcerated surfaces, no affected                            surfaces and no narrowings. Segment score: 0.                           - Left Colon: Findings include no ulcers present,                            no ulcerated  surfaces, no affected surfaces and no                            narrowings. Segment score: 0.                           - Rectum: Findings include no ulcers present, no                            ulcerated surfaces, no affected surfaces and no                            narrowings. Segment score: 0.                           -  Total SES-CD aggregate score: 0. Four biopsies                            were taken every 10 cm with a cold forceps from the                            entire colon for Crohn's disease surveillance and                            dysplasia surveillance. These biopsy specimens from                            the right colon and left colon were sent to                            Pathology.                           A 4 mm polyp was found in the sigmoid colon. The                            polyp was sessile. The polyp was removed with a                            cold snare. Resection and retrieval were complete.                           A few small and large-mouthed diverticula were                            found in the sigmoid colon.                           Internal hemorrhoids were found during                            retroflexion. The hemorrhoids were small. Complications:            No immediate complications. Estimated Blood Loss:     Estimated blood loss was minimal. Impression:               - The examined portion of the ileum was normal.                           - Simple Endoscopic Score for Crohn's Disease: 0,                            mucosal inflammatory changes secondary to Crohn's                            disease, in remission. Biopsied.                           - One 4 mm polyp in the sigmoid colon, removed with  a cold snare. Resected and retrieved.                           - Diverticulosis in the sigmoid colon.                           - Small internal hemorrhoids. Recommendation:           - Patient has a  contact number available for                            emergencies. The signs and symptoms of potential                            delayed complications were discussed with the                            patient. Return to normal activities tomorrow.                            Written discharge instructions were provided to the                            patient.                           - Resume previous diet.                           - Continue present medications.                           - Resume Plavix (clopidogrel) at prior dose                            tomorrow. Refer to managing physician for further                            adjustment of therapy.                           - Await pathology results.                           - Repeat colonoscopy is recommended for                            surveillance. The colonoscopy date will be                            determined after pathology results from today's                            exam become available for review. Jerene Bears, MD 04/11/2020 12:34:45 PM This report has been signed electronically.

## 2020-04-13 ENCOUNTER — Telehealth: Payer: Self-pay

## 2020-04-13 NOTE — Telephone Encounter (Signed)
  Follow up Call-  Call back number 04/11/2020 09/30/2017  Post procedure Call Back phone  # [phone]  Permission to leave phone message Yes Yes  Some recent data might be hidden     Patient questions:  Do you have a fever, pain , or abdominal swelling? No. Pain Score  0 *  Have you tolerated food without any problems? Yes.    Have you been able to return to your normal activities? Yes.    Do you have any questions about your discharge instructions: Diet   No. Medications  No. Follow up visit  No.  Do you have questions or concerns about your Care? No.  Actions: * If pain score is 4 or above: No action needed, pain <4.   1. Have you developed a fever since your procedure? no  2.   Have you had an respiratory symptoms (SOB or cough) since your procedure? no  3.   Have you tested positive for COVID 19 since your procedure no  4.   Have you had any family members/close contacts diagnosed with the COVID 19 since your procedure?  no   If yes to any of these questions please route to Joylene John, RN and Joella Prince, RN

## 2020-04-18 ENCOUNTER — Encounter: Payer: Self-pay | Admitting: Internal Medicine

## 2020-04-26 ENCOUNTER — Other Ambulatory Visit: Payer: Self-pay | Admitting: Internal Medicine

## 2020-04-26 DIAGNOSIS — E781 Pure hyperglyceridemia: Secondary | ICD-10-CM

## 2020-04-26 DIAGNOSIS — I2510 Atherosclerotic heart disease of native coronary artery without angina pectoris: Secondary | ICD-10-CM

## 2020-04-28 ENCOUNTER — Other Ambulatory Visit: Payer: Self-pay

## 2020-04-28 MED ORDER — HUMIRA (2 PEN) 40 MG/0.4ML ~~LOC~~ AJKT: 40.0000 mg | AUTO-INJECTOR | SUBCUTANEOUS | 11 refills | Status: DC

## 2020-05-02 ENCOUNTER — Telehealth: Payer: Self-pay | Admitting: Internal Medicine

## 2020-05-02 NOTE — Telephone Encounter (Signed)
Left msg vcml  to call office for follow up appointment

## 2020-05-09 NOTE — Telephone Encounter (Signed)
Patient wondering what the follow up was for, didn't see anything in the chart. Let me know and I can call him back

## 2020-05-10 NOTE — Telephone Encounter (Signed)
    Appointment made for 6 month follow up

## 2020-05-22 ENCOUNTER — Other Ambulatory Visit: Payer: Self-pay | Admitting: Internal Medicine

## 2020-06-01 ENCOUNTER — Ambulatory Visit: Payer: Medicare PPO | Admitting: Internal Medicine

## 2020-06-12 ENCOUNTER — Other Ambulatory Visit: Payer: Self-pay | Admitting: Internal Medicine

## 2020-06-14 ENCOUNTER — Ambulatory Visit: Payer: Medicare PPO | Admitting: Internal Medicine

## 2020-06-14 ENCOUNTER — Other Ambulatory Visit: Payer: Self-pay

## 2020-06-14 ENCOUNTER — Encounter: Payer: Self-pay | Admitting: Internal Medicine

## 2020-06-14 VITALS — BP 132/82 | HR 80 | Temp 98.2°F | Resp 16 | Ht 66.0 in | Wt 186.0 lb

## 2020-06-14 DIAGNOSIS — E781 Pure hyperglyceridemia: Secondary | ICD-10-CM

## 2020-06-14 DIAGNOSIS — D510 Vitamin B12 deficiency anemia due to intrinsic factor deficiency: Secondary | ICD-10-CM

## 2020-06-14 DIAGNOSIS — E118 Type 2 diabetes mellitus with unspecified complications: Secondary | ICD-10-CM | POA: Diagnosis not present

## 2020-06-14 DIAGNOSIS — I10 Essential (primary) hypertension: Secondary | ICD-10-CM

## 2020-06-14 LAB — BASIC METABOLIC PANEL
BUN: 21 mg/dL (ref 6–23)
CO2: 24 mEq/L (ref 19–32)
Calcium: 9.4 mg/dL (ref 8.4–10.5)
Chloride: 105 mEq/L (ref 96–112)
Creatinine, Ser: 1.1 mg/dL (ref 0.40–1.50)
GFR: 67.83 mL/min (ref >60.00)
Glucose, Bld: 130 mg/dL — ABNORMAL HIGH (ref 70–99)
Potassium: 4.5 mEq/L (ref 3.5–5.1)
Sodium: 138 mEq/L (ref 135–145)

## 2020-06-14 LAB — FOLATE: Folate: >23.6 ng/mL (ref >5.9)

## 2020-06-14 LAB — CBC WITH DIFFERENTIAL/PLATELET
Basophils Absolute: 0 10*3/uL (ref 0.0–0.1)
Basophils Relative: 0.6 % (ref 0.0–3.0)
Eosinophils Absolute: 0 10*3/uL (ref 0.0–0.7)
Eosinophils Relative: 0.6 % (ref 0.0–5.0)
HCT: 44.7 % (ref 39.0–52.0)
Hemoglobin: 15.6 g/dL (ref 13.0–17.0)
Lymphocytes Relative: 29.3 % (ref 12.0–46.0)
Lymphs Abs: 2 10*3/uL (ref 0.7–4.0)
MCHC: 35 g/dL (ref 30.0–36.0)
MCV: 94.8 fl (ref 78.0–100.0)
Monocytes Absolute: 0.8 10*3/uL (ref 0.1–1.0)
Monocytes Relative: 11.6 % (ref 3.0–12.0)
Neutro Abs: 4 10*3/uL (ref 1.4–7.7)
Neutrophils Relative %: 57.9 % (ref 43.0–77.0)
Platelets: 259 10*3/uL (ref 150.0–400.0)
RBC: 4.71 Mil/uL (ref 4.22–5.81)
RDW: 13.3 % (ref 11.5–15.5)
WBC: 6.9 10*3/uL (ref 4.0–10.5)

## 2020-06-14 LAB — VITAMIN B12: Vitamin B-12: 1082 pg/mL — ABNORMAL HIGH (ref 211–911)

## 2020-06-14 LAB — HEMOGLOBIN A1C: Hgb A1c MFr Bld: 6.1 % (ref 4.6–6.5)

## 2020-06-14 NOTE — Patient Instructions (Signed)

## 2020-06-14 NOTE — Progress Notes (Signed)
Subjective:  Patient ID: Darryl [name], male    DOB: 10/09/1950  Age: 70 y.o. MRN: [id number]  CC: Hypertension  This visit occurred during the SARS-CoV-2 public health emergency.  Safety protocols were in place, including screening questions prior to the visit, additional usage of staff PPE, and extensive cleaning of exam room while observing appropriate contact time as indicated for disinfecting solutions.    HPI PEARL BERLINGER presents for f/up-  He tells me his blood pressure has been well controlled.  He is active and denies any recent episodes of chest pain, shortness of breath, palpitations, edema, or fatigue.  Outpatient Medications Prior to Visit  Medication Sig Dispense Refill  . Adalimumab (HUMIRA PEN) 40 MG/0.4ML PNKT Inject 40 mg into the skin every 14 (fourteen) days. 2 each 11  . ADVAIR HFA 115-21 MCG/ACT inhaler USE 2 PUFFS TWICE DAILY AS NEEDED. SHAKE WELL 12 g 3  . cholecalciferol (VITAMIN D3) 25 MCG (1000 UT) tablet Take 1,000 Units by mouth daily.    . clopidogrel (PLAVIX) 75 MG tablet Take 1 tablet (75 mg total) by mouth daily. 90 tablet 2  . Coenzyme Q10 (CO Q 10 PO) Take 100 mg by mouth.    . folic acid (FOLVITE) 1 MG tablet TAKE 1 TABLET ONCE DAILY. (Patient taking differently: Take 1 mg by mouth daily.) 90 tablet 1  . HUMIRA PEN 40 MG/0.4ML PNKT every 14 (fourteen) days.     . hydrocortisone 2.5 % cream Apply 1 application topically daily.     . irbesartan (AVAPRO) 75 MG tablet Take 37.5 mg by mouth daily.    Marland Kitchen loperamide (IMODIUM A-D) 2 MG tablet Take 2 mg by mouth 4 (four) times daily as needed for diarrhea or loose stools.    . metoprolol succinate (TOPROL-XL) 25 MG 24 hr tablet TAKE 1 TABLET ONCE DAILY. 90 tablet 3  . midodrine (PROAMATINE) 2.5 MG tablet TAKE 1 TAB IN AM. MAY TAKE AN ADDITIONAL TAB AFTER 6HR IF STILL DIZZY/SYSTOLIC JO<83 40 tablet 1  . montelukast (SINGULAIR) 10 MG tablet TAKE ONE TABLET AT BEDTIME. 90 tablet 1  . Multiple Vitamins-Minerals  (CENTRUM SILVER PO) Take 1 tablet by mouth daily.    . nitroGLYCERIN (NITROSTAT) 0.4 MG SL tablet Place 1 tablet (0.4 mg total) under the tongue every 5 (five) minutes x 3 doses as needed for chest pain. 25 tablet 3  . pantoprazole (PROTONIX) 40 MG tablet TAKE 1 TABLET ONCE DAILY. 90 tablet 0  . rosuvastatin (CRESTOR) 40 MG tablet TAKE 1 TABLET ONCE DAILY. 90 tablet 3  . traMADol (ULTRAM) 50 MG tablet TAKE ONE TABLET EVERY 6 HOURS AS NEEDED. 30 tablet 0  . VASCEPA 1 g capsule TAKE (2) CAPSULES BY MOUTH TWICE DAILY. 360 capsule 1  . VENTOLIN HFA 108 (90 Base) MCG/ACT inhaler USE 2 PUFFS EVERY 6 HOURS AS NEEDED FOR WHEEZING. 18 g 3  . vitamin B-12 (CYANOCOBALAMIN) 1000 MCG tablet Take 1 tablet (1,000 mcg total) by mouth daily.    Marland Kitchen zolpidem (AMBIEN) 10 MG tablet Take 1 tablet (10 mg total) by mouth at bedtime as needed. 30 tablet 0   No facility-administered medications prior to visit.    ROS Review of Systems  Constitutional: Negative for diaphoresis, fatigue and unexpected weight change.  HENT: Negative.   Eyes: Negative for visual disturbance.  Respiratory: Negative for cough, chest tightness, shortness of breath and wheezing.   Cardiovascular: Negative for chest pain, palpitations and leg swelling.  Gastrointestinal: Negative for  abdominal pain, constipation, diarrhea, nausea and vomiting.  Endocrine: Negative.   Genitourinary: Negative.  Negative for difficulty urinating.  Musculoskeletal: Negative for arthralgias and myalgias.  Skin: Negative.   Neurological: Negative for dizziness, weakness and light-headedness.  Hematological: Negative for adenopathy. Does not bruise/bleed easily.  Psychiatric/Behavioral: Negative.     Objective:  BP 132/82 (BP Location: Right Arm, Patient Position: Sitting, Cuff Size: Large)   Pulse 80   Temp 98.2 F (36.8 C) (Oral)   Resp 16   Ht 5' 6"  (1.676 m)   Wt 186 lb (84.4 kg)   SpO2 99%   BMI 30.02 kg/m   BP Readings from Last 3 Encounters:   06/14/20 132/82  04/11/20 109/73  02/01/20 140/90    Wt Readings from Last 3 Encounters:  06/14/20 186 lb (84.4 kg)  04/11/20 185 lb (83.9 kg)  02/01/20 185 lb (83.9 kg)    Physical Exam Vitals reviewed.  HENT:     Nose: Nose normal.     Mouth/Throat:     Mouth: Mucous membranes are moist.  Eyes:     General: No scleral icterus.    Conjunctiva/sclera: Conjunctivae normal.  Cardiovascular:     Rate and Rhythm: Normal rate and regular rhythm.     Heart sounds: No murmur heard.   Pulmonary:     Effort: Pulmonary effort is normal.     Breath sounds: No stridor. No wheezing, rhonchi or rales.  Abdominal:     General: Abdomen is protuberant. Bowel sounds are normal. There is no distension.     Palpations: Abdomen is soft. There is no hepatomegaly, splenomegaly or mass.  Musculoskeletal:        General: Normal range of motion.     Cervical back: Neck supple.     Right lower leg: No edema.     Left lower leg: No edema.  Lymphadenopathy:     Cervical: No cervical adenopathy.  Skin:    General: Skin is warm and dry.     Coloration: Skin is not pale.  Neurological:     General: No focal deficit present.     Mental Status: He is alert.     Lab Results  Component Value Date   WBC 6.9 06/14/2020   HGB 15.6 06/14/2020   HCT 44.7 06/14/2020   PLT 259.0 06/14/2020   GLUCOSE 130 (H) 06/14/2020   CHOL 162 11/08/2019   TRIG 198 (H) 11/08/2019   HDL 72 11/08/2019   LDLCALC 63 11/08/2019   ALT 28 11/08/2019   AST 24 11/08/2019   NA 138 06/14/2020   K 4.5 06/14/2020   CL 105 06/14/2020   CREATININE 1.10 06/14/2020   BUN 21 06/14/2020   CO2 24 06/14/2020   TSH 1.16 11/08/2019   PSA 0.1 11/08/2019   INR 2.8 04/02/2019   HGBA1C 6.1 06/14/2020   MICROALBUR 1.6 11/08/2019    No results found.  Assessment & Plan:   Taher was seen today for hypertension.  Diagnoses and all orders for this visit:  Essential hypertension- His blood pressure is adequately well  controlled. -     CBC with Differential/Platelet; Future -     CBC with Differential/Platelet  Type II diabetes mellitus with manifestations ([place])- His blood sugar is well controlled. -     Basic metabolic panel; Future -     Hemoglobin A1c; Future -     Basic metabolic panel -     Hemoglobin A1c  Hypertriglyceridemia  Vitamin B12 deficiency anemia due to  intrinsic factor deficiency- His B12 and folate levels are normal now. -     CBC with Differential/Platelet; Future -     Vitamin B12; Future -     Folate; Future -     CBC with Differential/Platelet -     Vitamin B12 -     Folate   I am having Karl Pock "Philipp Ovens" maintain his vitamin G-16, folic acid, hydrocortisone, Multiple Vitamins-Minerals (CENTRUM SILVER PO), loperamide, nitroGLYCERIN, cholecalciferol, midodrine, Humira Pen, rosuvastatin, clopidogrel, zolpidem, Coenzyme Q10 (CO Q 10 PO), traMADol, irbesartan, Advair HFA, metoprolol succinate, Ventolin HFA, Vascepa, Humira Pen, montelukast, and pantoprazole.  No orders of the defined types were placed in this encounter.    Follow-up: Return in about 6 months (around 12/15/2020).  Scarlette Calico, MD

## 2020-06-26 ENCOUNTER — Other Ambulatory Visit: Payer: Self-pay | Admitting: Physician Assistant

## 2020-06-26 DIAGNOSIS — E785 Hyperlipidemia, unspecified: Secondary | ICD-10-CM

## 2020-07-14 ENCOUNTER — Encounter: Payer: Self-pay | Admitting: Internal Medicine

## 2020-07-14 ENCOUNTER — Telehealth (INDEPENDENT_AMBULATORY_CARE_PROVIDER_SITE_OTHER): Payer: Medicare PPO | Admitting: Internal Medicine

## 2020-07-14 DIAGNOSIS — J4521 Mild intermittent asthma with (acute) exacerbation: Secondary | ICD-10-CM

## 2020-07-14 MED ORDER — BENZONATATE 200 MG PO CAPS: 200.0000 mg | ORAL_CAPSULE | Freq: Three times a day (TID) | ORAL | 0 refills | Status: DC | PRN

## 2020-07-14 MED ORDER — AZITHROMYCIN 250 MG PO TABS: ORAL_TABLET | ORAL | 0 refills | Status: AC

## 2020-07-14 NOTE — Assessment & Plan Note (Addendum)
Flare today, does not tolerate prednisone well. Will double advair to 4 puffs BID to see if this can help avoid prednisone oral. Rx azithromycin given pcn allergy. Rx tessalon perles for cough.

## 2020-07-14 NOTE — Progress Notes (Signed)
Virtual Visit via Video Note  I connected with Darryl [name] on 07/14/20 at  3:20 PM EDT by a video enabled telemedicine application and verified that I am speaking with the correct person using two identifiers.  The patient and the provider were at separate locations throughout the entire encounter. Patient location: home, Provider location: work   I discussed the limitations of evaluation and management by telemedicine and the availability of in person appointments. The patient expressed understanding and agreed to proceed. The patient and the provider were the only parties present for the visit unless noted in HPI below.  History of Present Illness: The patient is a 70 y.o. man with visit for went on a trip to [place] last weekend Fri-Tuesday. Started with symptoms Monday with SOB. Has gotten this before with cold symptoms. Thought it could have been related to altitude. Flew home and overall was better. Wednesday started coughing. Took a home covid-19 test which was negative. Denies fevers or chills. Has some SOB now but improving. Does have asthma and is taking advair 2 puffs BID.  Is taking albuterol more often lately.   Observations/Objective: Appearance: normal, breathing appears normal speaking in full sentences, some coughing during visit, casual grooming, abdomen does not appear distended, throat not well visualized, mental status is A and O times 3  Assessment and Plan: See problem oriented charting  Follow Up Instructions: increase advair to 4 puffs BID for 5-7 days, rx azithromycin and tessalon perles  I discussed the assessment and treatment plan with the patient. The patient was provided an opportunity to ask questions and all were answered. The patient agreed with the plan and demonstrated an understanding of the instructions.   The patient was advised to call back or seek an in-person evaluation if the symptoms worsen or if the condition fails to improve as  anticipated.  Hoyt Koch, MD

## 2020-07-15 ENCOUNTER — Other Ambulatory Visit: Payer: Self-pay | Admitting: Cardiology

## 2020-07-21 ENCOUNTER — Telehealth: Payer: Self-pay | Admitting: Internal Medicine

## 2020-07-21 NOTE — Telephone Encounter (Signed)
Will let prescribing physician advise on next step.

## 2020-07-21 NOTE — Telephone Encounter (Signed)
Patient calling, was seen on 04.22.22  With Dr. Sharlet Salina and was given a zpack, he finished it but is still having symptoms so he is wondering if there is any other medication that can be sent in.

## 2020-07-24 NOTE — Telephone Encounter (Signed)
Make sure he is taking his singulair. Can add on flonase or zyrtec or sinus rinses.

## 2020-07-24 NOTE — Telephone Encounter (Signed)
Spoke with the patient and he stated that his taking Singulair. He stated that he would try Dr. Nathanial Millman recommendations. No other questions or concerns at this time.

## 2020-07-24 NOTE — Telephone Encounter (Signed)
  Follow up message   Patient calling to report he is still having stuffy nose and congestion. He is taking Sudafed  Patient requesting additional medication for congestion

## 2020-08-01 ENCOUNTER — Other Ambulatory Visit: Payer: Self-pay | Admitting: Physician Assistant

## 2020-08-28 ENCOUNTER — Other Ambulatory Visit: Payer: Self-pay | Admitting: Internal Medicine

## 2020-08-28 DIAGNOSIS — J4520 Mild intermittent asthma, uncomplicated: Secondary | ICD-10-CM

## 2020-08-28 DIAGNOSIS — I2510 Atherosclerotic heart disease of native coronary artery without angina pectoris: Secondary | ICD-10-CM

## 2020-08-28 DIAGNOSIS — E781 Pure hyperglyceridemia: Secondary | ICD-10-CM

## 2020-09-05 ENCOUNTER — Telehealth: Payer: Self-pay | Admitting: Internal Medicine

## 2020-09-05 NOTE — Telephone Encounter (Signed)
Patient said that while he was working today for 30-45 minutes he forgot what he was doing and forgot how to do his job. He said that he just went blank. He said that this has not happened before. He said that he went home and had no difficulty. He said that he played golf yesterday and the last 9 holes he got really hot. Transferred to [facility].

## 2020-09-06 ENCOUNTER — Ambulatory Visit: Payer: Medicare PPO | Admitting: Internal Medicine

## 2020-09-06 ENCOUNTER — Other Ambulatory Visit: Payer: Self-pay

## 2020-09-06 ENCOUNTER — Encounter: Payer: Self-pay | Admitting: Internal Medicine

## 2020-09-06 VITALS — BP 114/78 | HR 76 | Temp 97.8°F | Resp 16 | Ht 66.0 in | Wt 185.0 lb

## 2020-09-06 DIAGNOSIS — G459 Transient cerebral ischemic attack, unspecified: Secondary | ICD-10-CM | POA: Diagnosis not present

## 2020-09-06 DIAGNOSIS — D510 Vitamin B12 deficiency anemia due to intrinsic factor deficiency: Secondary | ICD-10-CM

## 2020-09-06 DIAGNOSIS — E118 Type 2 diabetes mellitus with unspecified complications: Secondary | ICD-10-CM | POA: Diagnosis not present

## 2020-09-06 DIAGNOSIS — I255 Ischemic cardiomyopathy: Secondary | ICD-10-CM | POA: Diagnosis not present

## 2020-09-06 DIAGNOSIS — I10 Essential (primary) hypertension: Secondary | ICD-10-CM | POA: Diagnosis not present

## 2020-09-06 DIAGNOSIS — I2510 Atherosclerotic heart disease of native coronary artery without angina pectoris: Secondary | ICD-10-CM | POA: Diagnosis not present

## 2020-09-06 LAB — CBC WITH DIFFERENTIAL/PLATELET
Basophils Absolute: 0 10*3/uL (ref 0.0–0.1)
Basophils Relative: 0.5 % (ref 0.0–3.0)
Eosinophils Absolute: 0 10*3/uL (ref 0.0–0.7)
Eosinophils Relative: 0.3 % (ref 0.0–5.0)
HCT: 44.5 % (ref 39.0–52.0)
Hemoglobin: 15.4 g/dL (ref 13.0–17.0)
Lymphocytes Relative: 24.1 % (ref 12.0–46.0)
Lymphs Abs: 1.7 10*3/uL (ref 0.7–4.0)
MCHC: 34.5 g/dL (ref 30.0–36.0)
MCV: 95.6 fl (ref 78.0–100.0)
Monocytes Absolute: 0.8 10*3/uL (ref 0.1–1.0)
Monocytes Relative: 12.2 % — ABNORMAL HIGH (ref 3.0–12.0)
Neutro Abs: 4.4 10*3/uL (ref 1.4–7.7)
Neutrophils Relative %: 62.9 % (ref 43.0–77.0)
Platelets: 266 10*3/uL (ref 150.0–400.0)
RBC: 4.66 Mil/uL (ref 4.22–5.81)
RDW: 13.1 % (ref 11.5–15.5)
WBC: 7 10*3/uL (ref 4.0–10.5)

## 2020-09-06 LAB — BASIC METABOLIC PANEL
BUN: 20 mg/dL (ref 6–23)
CO2: 26 mEq/L (ref 19–32)
Calcium: 9.4 mg/dL (ref 8.4–10.5)
Chloride: 104 mEq/L (ref 96–112)
Creatinine, Ser: 1.1 mg/dL (ref 0.40–1.50)
GFR: 67.72 mL/min (ref >60.00)
Glucose, Bld: 124 mg/dL — ABNORMAL HIGH (ref 70–99)
Potassium: 4.1 mEq/L (ref 3.5–5.1)
Sodium: 138 mEq/L (ref 135–145)

## 2020-09-06 LAB — HEMOGLOBIN A1C: Hgb A1c MFr Bld: 6.2 % (ref 4.6–6.5)

## 2020-09-06 LAB — BRAIN NATRIURETIC PEPTIDE: Pro B Natriuretic peptide (BNP): 15 pg/mL (ref 0.0–100.0)

## 2020-09-06 LAB — TROPONIN I (HIGH SENSITIVITY): High Sens Troponin I: 3 ng/L (ref 2–17)

## 2020-09-06 NOTE — Telephone Encounter (Signed)
[facility] FYI 6.14.22:  --Caller states at work. Had episodes of confusion and disorientation regarding work tasks. Stated played 2 hours of golf yesterday. No LOC but slight dizziness yesterday reported. Felt weak  Advised to see PCP within 4 hours, OV with Dr.Jones at 9:20AM 6.15.22

## 2020-09-06 NOTE — Patient Instructions (Signed)
Transient Ischemic Attack A transient ischemic attack (TIA) causes stroke-like symptoms that go away quickly. Having a TIA means that a person is at higher risk for a stroke. A TIA happens when blood supply to the brain is blocked temporarily. A TIA is amedical emergency. What are the causes? This condition is caused by a temporary blockage in an artery in the head or neck. This means the brain does not get the blood supply it needs. There is no permanent brain damage with a TIA. A blockage can be caused by: Fatty buildup in an artery in the head or neck (atherosclerosis). A blood clot. An artery tear (dissection). Inflammation of an artery (vasculitis). Sometimes the cause is not known. What increases the risk? Certain factors may make you more likely to develop this condition. Some of these are things that you can change, such as: Obesity. Using products that contain nicotine or tobacco. Taking oral birth control, especially if you also use tobacco. Not being active. Heavy alcohol use. Drug use, especially cocaine and methamphetamine. Medical conditions that may increase your risk include: High blood pressure (hypertension). High cholesterol. Diabetes. Heart disease (coronary artery disease). An irregular heartbeat, also called atrial fibrillation (AFib). Sickle cell disease. Sleep problems (sleep apnea). Chronic inflammatory diseases, such as rheumatoid arthritis or lupus. Blood clotting disorders (hypercoagulable state). Other risk factors include: Being over the age of 38. Being male. Family history of stroke. Previous history of blood clots, stroke, TIA, or heart attack. Having a history of preeclampsia. Migraine headache. What are the signs or symptoms? Symptoms of a TIA are the same as those of a stroke. The symptoms develop suddenly, and then go away quickly. They may include: Weakness or numbness in your face, arm, or leg, especially on one side of your body. Trouble  walking or moving your arms or legs. Trouble speaking, understanding speech, or both (aphasia). Vision changes, such as double vision, blurred vision, or loss of vision. Dizziness. Confusion. Loss of balance or coordination. Nausea and vomiting. Severe headache. If possible, note what time your symptoms started. Tell your health careprovider. How is this diagnosed? This condition may be diagnosed based on: Your symptoms and medical history. A physical exam. Imaging tests, usually a CT scan or MRI of the brain. Blood tests. You may also have other tests, including: Electrocardiogram (ECG). Echocardiogram. Carotid ultrasound. A scan of blood circulation in the brain (CT angiogram or MR angiogram). Continuous heart monitoring. How is this treated? The goal of treatment is to reduce the risk for a stroke. Stroke prevention therapies may include: Changes to diet and lifestyle, such as being physically active and stopping smoking. Medicines to thin the blood (antiplatelets or anticoagulants). Blood pressure medicines. Medicines to reduce cholesterol. Treating other health conditions, such as diabetes or AFib. If testing shows a narrowing in the arteries to your brain, your health care provider may recommend a procedure, such as: Carotid endarterectomy. This is done to remove the blockage from your artery. Carotid angioplasty and stenting. This uses a tube (stent) to open or widen an artery in the neck. The stent helps keep the artery open by supporting the artery walls. Follow these instructions at home: Medicines Take over-the-counter and prescription medicines only as told by your health care provider. If you were told to take a medicine to thin your blood, such as aspirin or an anticoagulant, take it exactly as told by your health care provider. Taking too much blood-thinning medicine can cause bleeding. Taking too little will not protect  you against a stroke and other  problems. Eating and drinking  Eat 5 or more servings of fruits and vegetables each day. Follow guidelines from your health care provider about your diet. You may need to follow a certain diet to help manage risk factors for stroke. This may include: Eating a low-fat, low-salt diet. Choosing high-fiber foods. Limiting carbohydrates and sugar. If you drink alcohol: Limit how much you have to: 0-1 drink a day for women who are not pregnant. 0-2 drinks a day for men. Know how much alcohol is in a drink. In the [place] one drink equals one 12 oz bottle of beer (397m), one 5 oz glass of wine (1438m, or one 1 oz glass of hard liquor (449m  General instructions Maintain a healthy weight. Try to get at least 30 minutes of exercise on most days. Get treatment if you have sleep apnea. Do not use any products that contain nicotine or tobacco. These products include cigarettes, chewing tobacco, and vaping devices, such as e-cigarettes. If you need help quitting, ask your health care provider. Do not use drugs. Keep all follow-up visits. This is important. Where to find more information American Stroke Association: www.stroke.org Get help right away if: You have chest pain or an irregular heartbeat. You have any symptoms of a stroke. "BE FAST" is an easy way to remember the main warning signs of a stroke. B - Balance. Signs are dizziness, sudden trouble walking, or loss of balance. E - Eyes. Signs are trouble seeing or a sudden change in vision. F - Face. Signs are sudden weakness or numbness of the face, or the face or eyelid drooping on one side. A - Arms. Signs are weakness or numbness in an arm. This happens suddenly and usually on one side of the body. S - Speech. Signs are sudden trouble speaking, slurred speech, or trouble understanding what people say. T - Time. Time to call emergency services. Write down what time symptoms started. You have other signs of a stroke, such as: A sudden,  severe headache with no known cause. Nausea or vomiting. Seizure. These symptoms may represent a serious problem that is an emergency. Do not wait to see if the symptoms will go away. Get medical help right away. Call your local emergency services (911 in the [place] Do not drive yourself to the hospital. Summary A transient ischemic attack (TIA) happens when an artery in the head or neck is blocked. The blockage clears before there is any permanent brain damage. A TIA is a medical emergency. Symptoms of a TIA are the same as those of a stroke. The symptoms develop suddenly, and then go away quickly. Having a TIA means that you are at higher risk for a stroke. The goal of treatment is to reduce your risk for a stroke. Treatment may include medicines to thin the blood and changes to diet and lifestyle. This information is not intended to replace advice given to you by your health care provider. Make sure you discuss any questions you have with your healthcare provider. Document Revised: 10/05/2019 Document Reviewed: 10/05/2019 Elsevier Patient Education  202[place]

## 2020-09-06 NOTE — Progress Notes (Signed)
Subjective:  Patient ID: Darryl [name], male    DOB: 1950-05-23  Age: 70 y.o. MRN: [id number]  CC: Hypertension  This visit occurred during the SARS-CoV-2 public health emergency.  Safety protocols were in place, including screening questions prior to the visit, additional usage of staff PPE, and extensive cleaning of exam room while observing appropriate contact time as indicated for disinfecting solutions.    HPI Darryl [name] presents for f/up -  1 day prior to this visit he had a brief episode of memory loss.  He was at work working on a computer and had a brief period where he could not function properly.  He says after a few minutes he recovered completely.  He drove himself home from the office.  He denies headache, blurred vision, slurred speech, ataxia, or paresthesias.  He plays golf and does not experience CP, DOE, palpitations, edema, or fatigue.  Outpatient Medications Prior to Visit  Medication Sig Dispense Refill   Adalimumab (HUMIRA PEN) 40 MG/0.4ML PNKT Inject 40 mg into the skin every 14 (fourteen) days. 2 each 11   ADVAIR HFA 115-21 MCG/ACT inhaler USE 2 PUFFS TWICE DAILY AS NEEDED. SHAKE WELL 12 g 1   benzonatate (TESSALON) 200 MG capsule Take 1 capsule (200 mg total) by mouth 3 (three) times daily as needed. 60 capsule 0   cholecalciferol (VITAMIN D3) 25 MCG (1000 UT) tablet Take 1,000 Units by mouth daily.     clopidogrel (PLAVIX) 75 MG tablet TAKE 1 TABLET ONCE DAILY. 90 tablet 1   Coenzyme Q10 (CO Q 10 PO) Take 100 mg by mouth.     folic acid (FOLVITE) 1 MG tablet TAKE 1 TABLET ONCE DAILY. (Patient taking differently: Take 1 mg by mouth daily.) 90 tablet 1   HUMIRA PEN 40 MG/0.4ML PNKT every 14 (fourteen) days.      hydrocortisone 2.5 % cream Apply 1 application topically daily.      irbesartan (AVAPRO) 75 MG tablet Take 37.5 mg by mouth daily.     loperamide (IMODIUM A-D) 2 MG tablet Take 2 mg by mouth 4 (four) times daily as needed for diarrhea or loose stools.      metoprolol succinate (TOPROL-XL) 25 MG 24 hr tablet TAKE 1 TABLET ONCE DAILY. 90 tablet 3   midodrine (PROAMATINE) 2.5 MG tablet TAKE 1 TAB IN AM. MAY TAKE AN ADDITIONAL TAB AFTER 6HR IF STILL DIZZY/SYSTOLIC YI<01 40 tablet 1   montelukast (SINGULAIR) 10 MG tablet TAKE ONE TABLET AT BEDTIME. 90 tablet 1   Multiple Vitamins-Minerals (CENTRUM SILVER PO) Take 1 tablet by mouth daily.     nitroGLYCERIN (NITROSTAT) 0.4 MG SL tablet 1 TAB UNDER TONGUE AS NEEDED FOR CHEST PAIN. MAY REPEAT EVERY 5 MIN FOR A TOTAL OF 3 DOSES. 25 tablet 3   pantoprazole (PROTONIX) 40 MG tablet TAKE 1 TABLET ONCE DAILY. 90 tablet 0   rosuvastatin (CRESTOR) 40 MG tablet TAKE 1 TABLET ONCE DAILY. 90 tablet 3   traMADol (ULTRAM) 50 MG tablet TAKE ONE TABLET EVERY 6 HOURS AS NEEDED. 30 tablet 0   VASCEPA 1 g capsule TAKE (2) CAPSULES BY MOUTH TWICE DAILY. 360 capsule 1   VENTOLIN HFA 108 (90 Base) MCG/ACT inhaler USE 2 PUFFS EVERY 6 HOURS AS NEEDED FOR WHEEZING. 18 g 3   vitamin B-12 (CYANOCOBALAMIN) 1000 MCG tablet Take 1 tablet (1,000 mcg total) by mouth daily.     zolpidem (AMBIEN) 10 MG tablet Take 1 tablet (10 mg total) by mouth at  bedtime as needed. 30 tablet 0   No facility-administered medications prior to visit.    ROS Review of Systems  Constitutional: Negative.  Negative for diaphoresis and fatigue.  HENT: Negative.    Eyes: Negative.   Respiratory:  Negative for cough, chest tightness, shortness of breath and wheezing.   Gastrointestinal:  Negative for abdominal pain, blood in stool, constipation, diarrhea, nausea and vomiting.  Endocrine: Negative.   Genitourinary: Negative.  Negative for difficulty urinating.  Musculoskeletal:  Negative for arthralgias and myalgias.  Skin: Negative.   Neurological: Negative.  Negative for dizziness, seizures, syncope, speech difficulty, weakness, light-headedness, numbness and headaches.  Hematological:  Negative for adenopathy. Does not bruise/bleed easily.   Psychiatric/Behavioral:  Positive for confusion.    Objective:  BP 114/78 (BP Location: Left Arm, Patient Position: Sitting, Cuff Size: Large)   Pulse 76   Temp 97.8 F (36.6 C) (Oral)   Resp 16   Ht 5' 6"  (1.676 m)   Wt 185 lb (83.9 kg)   SpO2 97%   BMI 29.86 kg/m   BP Readings from Last 3 Encounters:  09/06/20 114/78  06/14/20 132/82  04/11/20 109/73    Wt Readings from Last 3 Encounters:  09/06/20 185 lb (83.9 kg)  06/14/20 186 lb (84.4 kg)  04/11/20 185 lb (83.9 kg)    Physical Exam Vitals reviewed.  Constitutional:      Appearance: Normal appearance.  HENT:     Mouth/Throat:     Mouth: Mucous membranes are moist.  Eyes:     General: No scleral icterus.    Conjunctiva/sclera: Conjunctivae normal.  Cardiovascular:     Rate and Rhythm: Normal rate and regular rhythm.     Heart sounds: Normal heart sounds, S1 normal and S2 normal. No murmur heard.   No friction rub. No gallop.     Comments: EKG- NSR, 69 bpm Low voltage New Q waves in II and aVF New loss of voltage over the lateral leads Pulmonary:     Effort: Pulmonary effort is normal.     Breath sounds: No stridor. No wheezing, rhonchi or rales.  Abdominal:     General: Abdomen is flat.     Palpations: There is no mass.     Tenderness: There is no abdominal tenderness. There is no guarding.     Hernia: No hernia is present.  Musculoskeletal:        General: Normal range of motion.     Cervical back: No tenderness.     Right lower leg: No edema.     Left lower leg: No edema.  Lymphadenopathy:     Cervical: No cervical adenopathy.  Skin:    General: Skin is warm and dry.     Findings: No rash.  Neurological:     General: No focal deficit present.     Mental Status: He is alert. Mental status is at baseline.     Cranial Nerves: No cranial nerve deficit.     Motor: No weakness.     Coordination: Coordination normal.     Gait: Gait normal.     Deep Tendon Reflexes: Reflexes normal.  Psychiatric:         Mood and Affect: Mood normal.        Behavior: Behavior normal.    Lab Results  Component Value Date   WBC 7.0 09/06/2020   HGB 15.4 09/06/2020   HCT 44.5 09/06/2020   PLT 266.0 09/06/2020   GLUCOSE 124 (H) 09/06/2020   CHOL 162  11/08/2019   TRIG 198 (H) 11/08/2019   HDL 72 11/08/2019   LDLCALC 63 11/08/2019   ALT 28 11/08/2019   AST 24 11/08/2019   NA 138 09/06/2020   K 4.1 09/06/2020   CL 104 09/06/2020   CREATININE 1.10 09/06/2020   BUN 20 09/06/2020   CO2 26 09/06/2020   TSH 1.16 11/08/2019   PSA 0.1 11/08/2019   INR 2.8 04/02/2019   HGBA1C 6.2 09/06/2020   MICROALBUR 1.6 11/08/2019    No results found.  Assessment & Plan:   Kayne was seen today for hypertension.  Diagnoses and all orders for this visit:  TIA (transient ischemic attack)- I recommended that he undergo an MRI to screen for CVA, bleed, atrophy, demyelination. -     Cancel: MR Brain Wo Contrast; Future -     MR Brain Wo Contrast; Future  Essential hypertension- His BP is well controlled -     EKG 12-Lead -     Basic metabolic panel; Future -     CBC with Differential/Platelet; Future -     CBC with Differential/Platelet -     Basic metabolic panel  Type II diabetes mellitus with manifestations ([place])- His blood sugar is well controlled. 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 -     Troponin I (High Sensitivity); Future -     Brain natriuretic peptide; Future -     Ambulatory referral to Cardiology -     Brain natriuretic peptide -     Troponin I (High Sensitivity)  Ischemic cardiomyopathy- He has new EKG changes.  I have asked  him to see cardiology as soon as possible. -     Troponin I (High Sensitivity); Future -     Brain natriuretic peptide; Future -     Ambulatory referral to Cardiology -     Brain natriuretic peptide -     Troponin I (High Sensitivity)  I am having Karl Pock "Philipp Ovens" maintain his vitamin Q-76, folic acid, hydrocortisone, Multiple Vitamins-Minerals (CENTRUM SILVER PO), loperamide, cholecalciferol, midodrine, Humira Pen, zolpidem, Coenzyme Q10 (CO Q 10 PO), traMADol, irbesartan, metoprolol succinate, Ventolin HFA, Humira Pen, montelukast, pantoprazole, rosuvastatin, benzonatate, clopidogrel, nitroGLYCERIN, Advair HFA, and Vascepa.  No orders of the defined types were placed in this encounter.    Follow-up: Return in about 4 weeks (around 10/04/2020).  Scarlette Calico, MD

## 2020-09-07 ENCOUNTER — Telehealth: Payer: Self-pay | Admitting: Cardiology

## 2020-09-07 NOTE — Telephone Encounter (Signed)
New Message:    Pt said he had a TIA on Tuesday(09-05-20). He went to see his family daughter on 09-06-20- Pt was told there was significant changes ib his EKG. Pt wanted to be seen, I made a MY Chart visit for 09-14-20. Please call to evaluate.

## 2020-09-07 NOTE — Telephone Encounter (Signed)
RN returned call to patient who states he was seen by his PCP yesterday and was told there was significant changes to his EKG and they would send it to the cardiologist. Patient states they did not specify what type of changes. Patient states he has noticed some small differences with his symptoms such as minimal shortness of breath when walking up stairs but nothing significant. Patient denies symptoms at this time. Patient has appointment scheduled for 6/23. RN advised I would send a message to Dr. Ellyn Hack and his nurse for review. Patient verbalized understanding.

## 2020-09-09 ENCOUNTER — Ambulatory Visit
Admission: RE | Admit: 2020-09-09 | Discharge: 2020-09-09 | Disposition: A | Discharge status: Home / Self Care | Payer: Medicare PPO | Source: Ambulatory Visit | Attending: Internal Medicine | Admitting: Internal Medicine

## 2020-09-09 DIAGNOSIS — G459 Transient cerebral ischemic attack, unspecified: Secondary | ICD-10-CM

## 2020-09-09 DIAGNOSIS — D110 Benign neoplasm of parotid gland: Secondary | ICD-10-CM | POA: Diagnosis not present

## 2020-09-09 DIAGNOSIS — Z9889 Other specified postprocedural states: Secondary | ICD-10-CM | POA: Diagnosis not present

## 2020-09-09 DIAGNOSIS — R410 Disorientation, unspecified: Secondary | ICD-10-CM | POA: Diagnosis not present

## 2020-09-09 IMAGING — MR MR HEAD W/O CM
10 series · 48 of 48 positions shown · non-contrast
Comparison: Paranasal sinus CT [DATE].

CLINICAL DATA: 70 year old male status post TIA symptoms on
[DATE]. Some residual confusion.

EXAM:
MRI HEAD WITHOUT CONTRAST
TECHNIQUE: Multiplanar, multiecho pulse sequences of the brain and surrounding
structures were obtained without intravenous contrast.

[Series 2: T1 · sagittal · 5.0mm · 0.45mm/px · 2 of 21 slices shown]
[im 1/21]
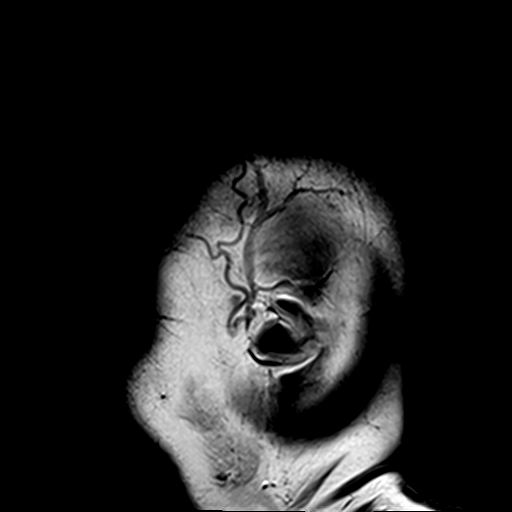
[im 21/21]
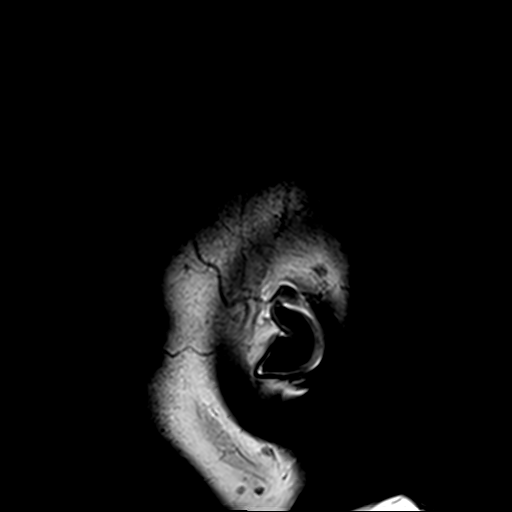

[Series 3: DWI · axial · 3.0mm · 1.80mm/px · z∈[-33,+114]mm · 9 of 100 slices shown (1 of 4)]
[im 1/100]
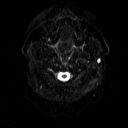
[im 13/100]
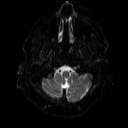
[im 25/100]
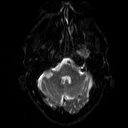
[im 38/100]
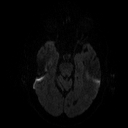
[im 50/100]
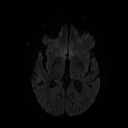
[im 62/100]
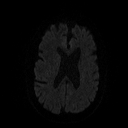
[im 75/100]
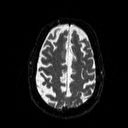
[im 87/100]
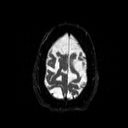
[im 100/100]
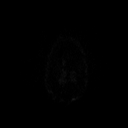

[Series 4: DWI · axial · 3.0mm · 1.80mm/px · z∈[-33,+114]mm · 4 of 49 slices shown (2 of 4)]
[im 1/49]
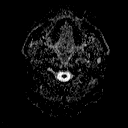
[im 17/49]
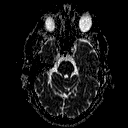
[im 33/49]
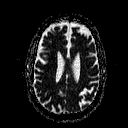
[im 49/49]
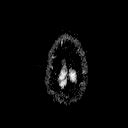

[Series 5: DWI · coronal · 5.0mm · 1.80mm/px · 6 of 71 slices shown (3 of 4)]
[im 1/71]
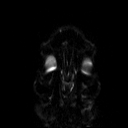
[im 15/71]
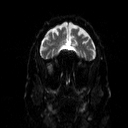
[im 29/71]
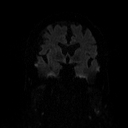
[im 43/71]
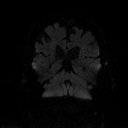
[im 57/71]
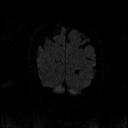
[im 71/71]
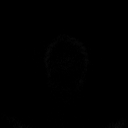

[Series 6: DWI · coronal · 5.0mm · 1.80mm/px · 3 of 36 slices shown (4 of 4)]
[im 1/36]
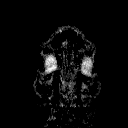
[im 18/36]
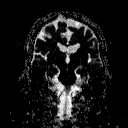
[im 36/36]
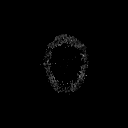

[Series 7: T2 · axial · 5.0mm · 0.90mm/px · z∈[-32,+115]mm · 2 of 22 slices shown (1 of 2)]
[im 1/22]
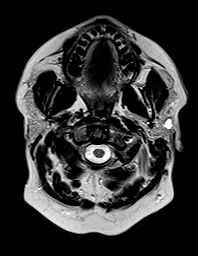
[im 22/22]
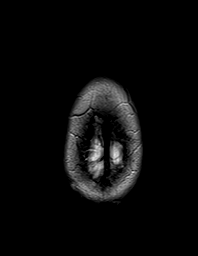

[Series 8: FLAIR · axial · 3.0mm · 0.45mm/px · z∈[-27,+108]mm · 3 of 30 slices shown]
[im 1/30]
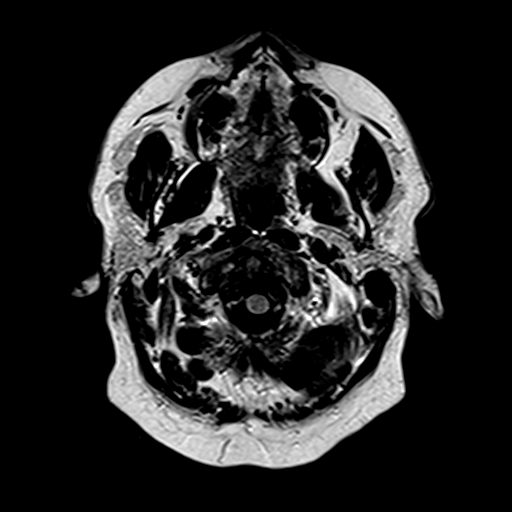
[im 15/30]
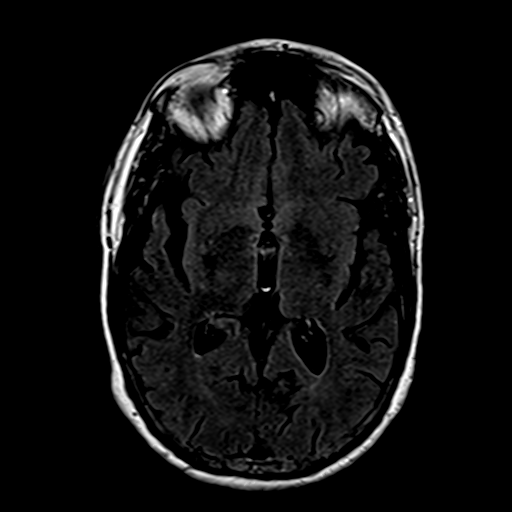
[im 30/30]
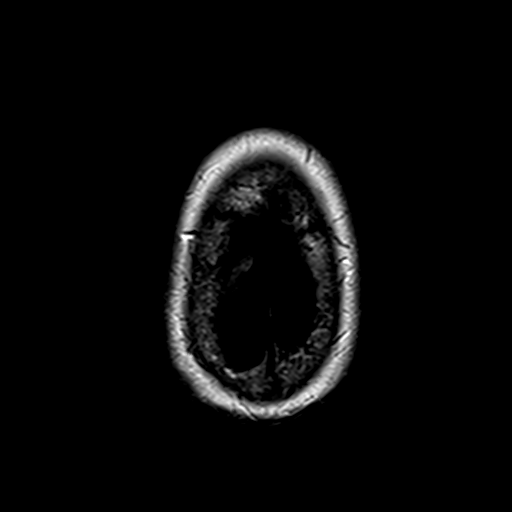

[Series 10: swi_images · axial · 4.0mm · 0.90mm/px · z∈[-29,+111]mm · 3 of 36 slices shown]
[im 1/36]
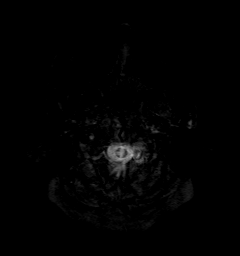
[im 18/36]
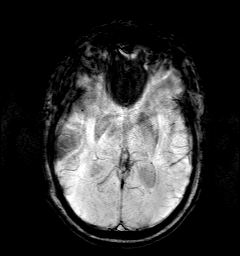
[im 36/36]
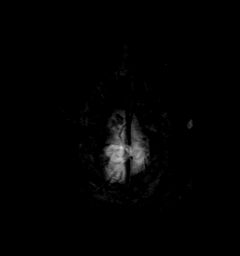

[Series 11: t1_mpr_tra · axial · 1.0mm · 0.71mm/px · z∈[-30,+113]mm · 13 of 144 slices shown]
[im 1/144]
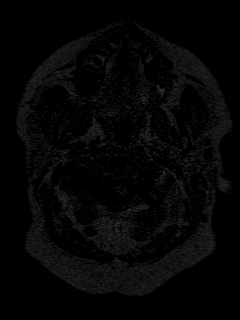
[im 12/144]
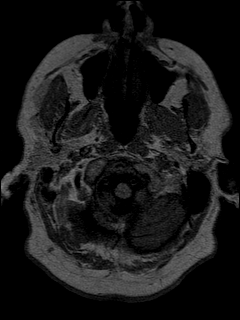
[im 24/144]
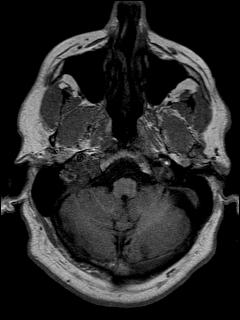
[im 36/144]
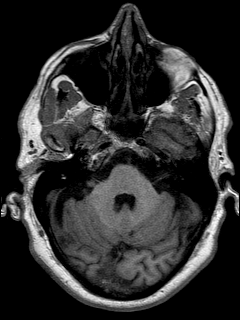
[im 48/144]
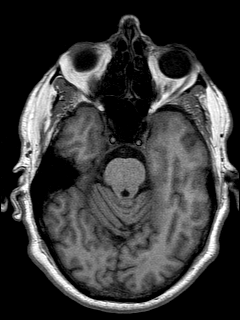
[im 60/144]
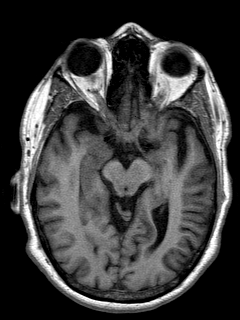
[im 72/144]
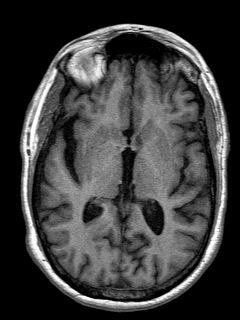
[im 84/144]
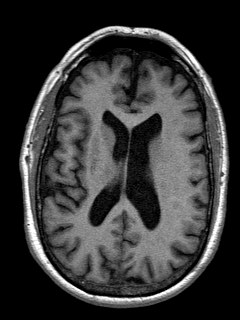
[im 96/144]
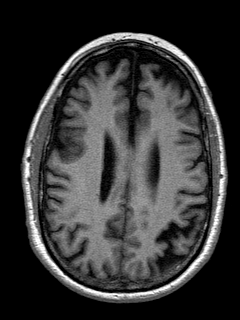
[im 108/144]
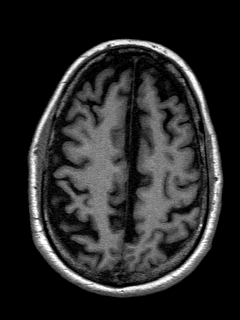
[im 120/144]
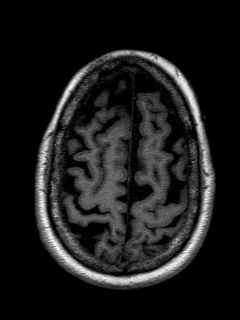
[im 132/144]
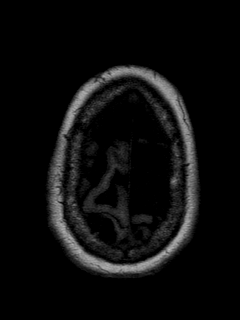
[im 144/144]
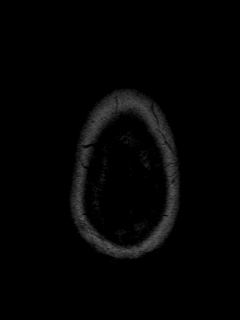

[Series 12: T2 · coronal · 5.0mm · 0.45mm/px · 3 of 28 slices shown (2 of 2)]
[im 1/28]
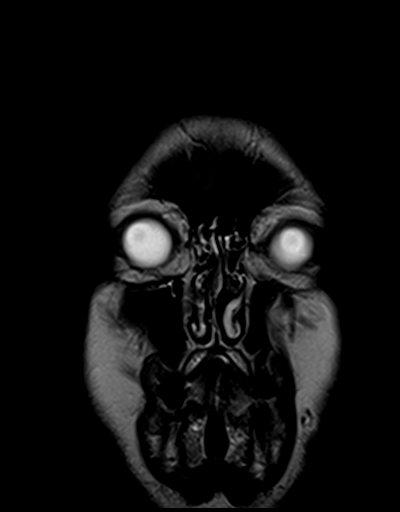
[im 14/28]
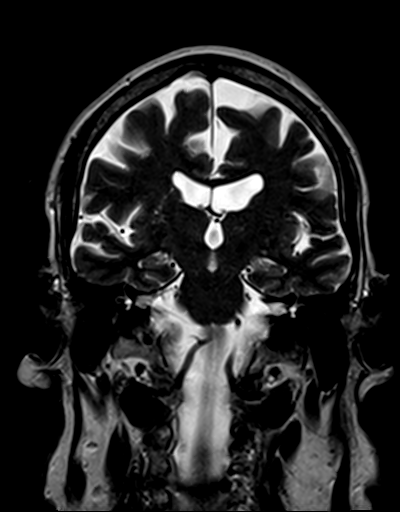
[im 28/28]
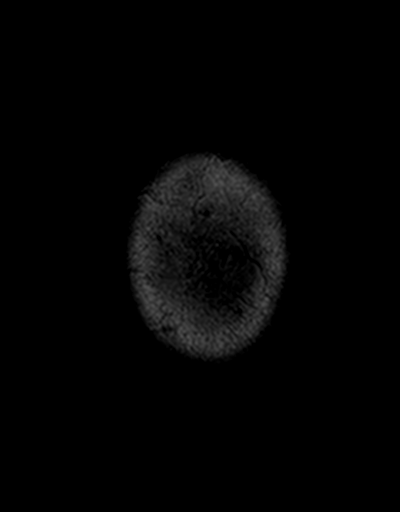

[48 of 48 positions shown; findings below may reference images not displayed]

FINDINGS: Brain: No restricted diffusion to suggest acute infarction. No
midline shift, mass effect, evidence of mass lesion,
ventriculomegaly, extra-axial collection or acute intracranial
hemorrhage. Cervicomedullary junction and pituitary are within
normal limits.

Cerebral volume loss appears somewhat disproportionate in the
biparietal regions (series 8, image 24). But background gray and
white matter signal appear normal for age throughout the brain. No
cortical encephalomalacia or chronic cerebral blood products are
identified. Deep gray matter nuclei, brainstem and cerebellum appear
within normal limits.

Vascular: Major intracranial vascular flow voids are preserved.

Skull and upper cervical spine: Negative for age visible cervical
spine. Visualized bone marrow signal is within normal limits.

Sinuses/Orbits: Postoperative changes to both globes. Otherwise
negative orbits. Paranasal Visualized paranasal sinuses and mastoids
are stable and well aerated.

Other: Grossly normal visible internal auditory structures.

There is a small circumscribed and perhaps mildly septated T2
hyperintense nodule in the left superior parotid gland with mildly
increased diffusion. Favor a small benign mixed tumor (ROUSSEL) of the
parotid. Other visible scalp and face soft tissues appear negative.
IMPRESSION: 1. No acute intracranial abnormality.
Normal for age noncontrast MRI appearance of the brain aside from
possible disproportionate volume loss in the biparietal regions.
Query signs or symptoms of dementia.

2. A small benign salivary tumor of the left parotid gland is
suspected, significance doubtful and can be followed up with ENT.

## 2020-09-11 ENCOUNTER — Telehealth: Payer: Self-pay | Admitting: *Deleted

## 2020-09-11 ENCOUNTER — Other Ambulatory Visit: Payer: Self-pay | Admitting: Internal Medicine

## 2020-09-11 ENCOUNTER — Encounter: Payer: Self-pay | Admitting: Internal Medicine

## 2020-09-11 NOTE — Telephone Encounter (Signed)
-----   Message from [name], MD sent at 09/10/2020  1:45 PM EDT ----- Regarding: Pre-visti EKG Darryl [name] - Darryl [name] was scheduled for a Virtual Visit on 6/23 to discuss a change in EKG -- to me it looks like lead positioning error.   He needs an EKG done in our office prior to the 6/23 virtual visit otherwise, the visit will not be worth it.  Darryl [name]

## 2020-09-11 NOTE — Telephone Encounter (Signed)
Patient called back . He received message . Patient will come by office between 1:30 - 2 pm for EKG.

## 2020-09-11 NOTE — Telephone Encounter (Signed)
Per note . Called patient . No answer left message- to come to office tomorrow for EKG per Dr Ellyn Hack request .

## 2020-09-12 ENCOUNTER — Encounter: Payer: Self-pay | Admitting: *Deleted

## 2020-09-12 ENCOUNTER — Ambulatory Visit (INDEPENDENT_AMBULATORY_CARE_PROVIDER_SITE_OTHER): Payer: Medicare PPO | Admitting: *Deleted

## 2020-09-12 ENCOUNTER — Other Ambulatory Visit: Payer: Self-pay

## 2020-09-12 VITALS — BP 102/62 | HR 72 | Ht 66.0 in | Wt 182.8 lb

## 2020-09-12 DIAGNOSIS — R9431 Abnormal electrocardiogram [ECG] [EKG]: Secondary | ICD-10-CM | POA: Diagnosis not present

## 2020-09-12 NOTE — Progress Notes (Signed)
Reason for visit:  obtain an 12 EKG   Name of MD requesting visit: Dr Ellyn Hack  H&P: n/a  ROS related to problem:  patient had recent EKG done at primary's office. EKG was sent for Dr Ellyn Hack to review and patient has an virtual appointment on 09/14/20 with Dr Ellyn Hack to review.  Dr Ellyn Hack  requested the patient come to office prior to upcoming appointment to have another EKG  completed to compare both since  appointment is an virtual appointment.  Assessment and plan per MD:  patient arrived to office RN reviewed medication list and vital sign obtained. EKG completed and reviewed by D.O.D ( Dr Debara Pickett) . Patient did not voice any issues today. Patient aware of virtual visit for 09/14/20.  Patient aware Dr Ellyn Hack will have access to EKG for visit. RN  gave instruction on how appointment will proceed. Patient voiced understanding. Appointment completed .

## 2020-09-12 NOTE — Patient Instructions (Signed)
No changes at present time  Keep appointment for 09/14/20

## 2020-09-12 NOTE — Telephone Encounter (Signed)
See note from 09/11/20. Spoke to patient and he will come to the office on 09/12/20 to have EKG obtained prior to virtual appointment.

## 2020-09-14 ENCOUNTER — Encounter: Payer: Self-pay | Admitting: Cardiology

## 2020-09-14 ENCOUNTER — Telehealth (INDEPENDENT_AMBULATORY_CARE_PROVIDER_SITE_OTHER): Payer: Medicare PPO | Admitting: Cardiology

## 2020-09-14 ENCOUNTER — Telehealth: Payer: Self-pay | Admitting: *Deleted

## 2020-09-14 DIAGNOSIS — Z9861 Coronary angioplasty status: Secondary | ICD-10-CM

## 2020-09-14 DIAGNOSIS — E785 Hyperlipidemia, unspecified: Secondary | ICD-10-CM

## 2020-09-14 DIAGNOSIS — I255 Ischemic cardiomyopathy: Secondary | ICD-10-CM | POA: Diagnosis not present

## 2020-09-14 DIAGNOSIS — I2510 Atherosclerotic heart disease of native coronary artery without angina pectoris: Secondary | ICD-10-CM

## 2020-09-14 DIAGNOSIS — E1169 Type 2 diabetes mellitus with other specified complication: Secondary | ICD-10-CM

## 2020-09-14 DIAGNOSIS — I10 Essential (primary) hypertension: Secondary | ICD-10-CM

## 2020-09-14 DIAGNOSIS — G459 Transient cerebral ischemic attack, unspecified: Secondary | ICD-10-CM

## 2020-09-14 DIAGNOSIS — R9431 Abnormal electrocardiogram [ECG] [EKG]: Secondary | ICD-10-CM | POA: Insufficient documentation

## 2020-09-14 NOTE — Assessment & Plan Note (Signed)
I reviewed the EKG checked during her most recent visit with Dr. Ronnald Ramp and compared it to the previous visit with Dr. Ronnald Ramp as well as the previous when here last year.  It does appear that there was limb lead reversal leading to different polarity of the leads.  The recheck lead EKG today was completely back to his baseline.  Very unlikely that anything new has occurred from a cardiac standpoint.

## 2020-09-14 NOTE — Telephone Encounter (Signed)
RN spoke to patient. Instruction were given  from today's virtual visit 09/14/20 .  AVS Summary has been sent by mychart . Follow up appointment placed for recall for 9 month ( March 2023)   Patient verbalized understanding.

## 2020-09-14 NOTE — Assessment & Plan Note (Signed)
He says he has multivessel disease with LAD CTO, complicated by inferior STEMI requiring acute PCI of the RCA and then staged PCI to RI.  There does appear to be some collaterals from the RCA to the LAD, but not much LAD distribution viability.  EF is now back up to 40 to 45% and no further angina despite being very active.  Plan: Continue low-dose of both Toprol and ARB along with rosuvastatin and Vascepa  With multiple stents and CTO of the LAD, he is on maintenance dose Plavix without aspirin  Okay to hold Plavix 5 days preop for surgeries or procedures, or more high risk procedures or surgeries, can hold for 7 days.

## 2020-09-14 NOTE — Assessment & Plan Note (Signed)
Tolerating low-dose Toprol and Avapro.  We have not been able to titrate up further because of issues with orthostatic hypotension.  Maintaining adequate hydration.  Still has PRN midodrine for hypotension, but has not required.

## 2020-09-14 NOTE — Assessment & Plan Note (Addendum)
Should be due to have his labs checked in the next month or so.  Most recent lipids were from August 2021, LDL was 63 on current dose of rosuvastatin.  Triglycerides were little elevated and they started Vascepa.  Hopefully this will improve both levels.  He denies any myalgias or arthralgias. We will await results from August.  A1c 6.2.  Currently not on any medications-trying lifestyle modification.  Low threshold to consider metformin, but would consider SGLT2 inhibitor or GLP-1 agonist for cardiovascular benefit.Marland Kitchen

## 2020-09-14 NOTE — Progress Notes (Signed)
Virtual Visit via Telephone Note   This visit type was conducted due to national recommendations for restrictions regarding the COVID-19 Pandemic (e.g. social distancing) in an effort to limit this patient's exposure and mitigate transmission in our community.  Due to his co-morbid illnesses, this patient is at least at moderate risk for complications without adequate follow up.  This format is felt to be most appropriate for this patient at this time.  The patient did not have access to video technology/had technical difficulties with video requiring transitioning to audio format only (telephone).  All issues noted in this document were discussed and addressed.  No physical exam could be performed with this format.  Please refer to the patient's chart for his  consent to telehealth for [facility].   Patient has given verbal permission to conduct this visit via virtual appointment and to bill insurance 09/14/2020 3:15 PM     Evaluation Performed:  Follow-up visit  Date:  09/14/2020   ID:  Darryl [name], Darryl [name] [birth year], MRN [id number]  Patient Location: Home Provider Location: Home Office  PCP:  Janith Lima, MD  Cardiologist:  Glenetta Hew, MD  Electrophysiologist:  None   Chief Complaint:   No chief complaint on file.   ====================================  ASSESSMENT & PLAN:    Problem List Items Addressed This Visit     Coronary artery disease involving native coronary artery of native heart without angina pectoris (Chronic)    He says he has multivessel disease with LAD CTO, complicated by inferior STEMI requiring acute PCI of the RCA and then staged PCI to RI.  There does appear to be some collaterals from the RCA to the LAD, but not much LAD distribution viability.  EF is now back up to 40 to 45% and no further angina despite being very active.  Plan: Continue low-dose of both Toprol and ARB along with rosuvastatin and Vascepa With multiple stents and CTO of the LAD,  he is on maintenance dose Plavix without aspirin Okay to hold Plavix 5 days preop for surgeries or procedures, or more high risk procedures or surgeries, can hold for 7 days.       CAD S/P percutaneous coronary angioplasty (Chronic)    Occluded LAD with PCI to the RCA and RI.  On maintenance Plavix as he is no longer on warfarin for LV apical thrombus.  Not on aspirin because of concern for GI bleed with Crohn's.  On maintenance dose Plavix 75 mg-okay to hold 5-7 days preop for surgical procedures.  (7 days for more high risk procedures such as neurologic, spinal or organ biopsy) He will also hold Plavix temporarily 3 to 5 days) if he has GI bleeding from Crohn's       Hyperlipidemia associated with type 2 diabetes mellitus ([place]) (Chronic)    Should be due to have his labs checked in the next month or so.  Most recent lipids were from August 2021, LDL was 63 on current dose of rosuvastatin.  Triglycerides were little elevated and they started Vascepa.  Hopefully this will improve both levels.  He denies any myalgias or arthralgias. We will await results from August.  A1c 6.2.  Currently not on any medications-trying lifestyle modification.  Low threshold to consider metformin, but would consider SGLT2 inhibitor or GLP-1 agonist for cardiovascular benefit.Marland Kitchen         TIA (transient ischemic attack)    The episode in question sounds less like a TIA and more like [employer]ient [employer]  Amnesia.  Thankfully, no further episodes and his MRI of the brain was normal.  Continue to monitor.       Nonspecific abnormal electrocardiogram (ECG) (EKG)    I reviewed the EKG checked during her most recent visit with Dr. Ronnald Ramp and compared it to the previous visit with Dr. Ronnald Ramp as well as the previous when here last year.  It does appear that there was limb lead reversal leading to different polarity of the leads.  The recheck lead EKG today was completely back to his baseline.  Very unlikely that  anything new has occurred from a cardiac standpoint.       Essential hypertension (Chronic)    Tolerating low-dose Toprol and Avapro.  We have not been able to titrate up further because of issues with orthostatic hypotension.  Maintaining adequate hydration.  Still has PRN midodrine for hypotension, but has not required.       Ischemic cardiomyopathy (Chronic)    EF now up to 40 and 45% with a large anterior defect secondary to LAD occlusion.  MRI did not show viability in this area and therefore not reasonable to consider CTO. Not really having any CHF symptoms-class I at most.  Plan: Continue Toprol and Avapro.  No diuretic requirement-would prefer not to use diuretic especially since he has had some orthostatic symptoms in the past..        ====================================  History of Present Illness:    Darryl [name] is a 70 y.o. male with PMH notable for Acute Inferior STEMI (06/2018, RCA PCI with staged PCI to RI.  Has CTO LAD) with mild ICM (initial LV thrombus), Orthostatic Hypotension, and HLD, and Crohn's Colitis (with history of significant anemia in response to azathioprine) who presents via audio/video conferencing for a telehealth visit today as a work in visit to discuss abnormal EKG at the request of Janith Lima, MD.  Darryl [name] was last seen on November 24, 2019 for routine follow-up.  He was doing quite well from a cardiac standpoint.  Back out riding his bike at least 3-4 times a week.  Walking 1 to 2 miles a day.  Not really noting the orthostasis as much.  Was not using midodrine very much at all.  The only thing he was upset about was not losing weight.  No angina or heart failure symptoms.  No arrhythmias or syncope/near syncope.  Hospitalizations:  None  He is being seen at the request of Janith Lima, MD because of an abnormal EKG checked during a clinic visit on September 06, 2020. -> [place] contact his PCP for evaluation of about a 30-minute  episode of memory loss/amnesia.  He was working at his desk at work, and for least 30 minutes was not able to remember how to turn a computer on or how to make it function properly.  Interestingly, he was able to carry on normal conversations, but just did not remember what he was saying.  He then drove himself home from the office after recovering and felt fine.  Has not had any further episodes.  There is no headache, blurred vision, slurred speech, ataxia or paresthesias. ->  An EKG was checked as part of the evaluation and it showed notable changes when compared to previous EKG-most notably there were now Q waves as opposed R waves in the high lateral leads (I, aVL as well as II, and R wave in aVR).  Because of this concern, [place] contacted our office for evaluation.  We actually had him come in for an EKG prior to this visit-reviewed below.  Recent - Interim CV studies:    The following studies were reviewed today: None:  Inerval History   Darryl [name] indicates that really other than that one 30-minute episode of amnesia, he has not had any significant symptoms whatsoever though.  He remains very active, rides his bike (actually rode his bike 10 miles that day), and walks routinely.  He does not think he is "taking care of himself "as well as he should, not eating as well and not doing as much exercise as he usually does because of the heat.  He is try to get himself back motivated again.  He has not had any further episodes of amnesia, no unilateral weakness or numbness.  No slurred speech etc.  Nothing to suspect further TIA or global amnestic event.  He has not had any cardiovascular symptoms of angina with rest or exertion, no CHF symptoms of PND, orthopnea edema.  During that episode he denies any rapid irregular heartbeats palpitations, nor has he had any since.  No syncope or near syncope.   ROS:  Please see the history of present illness.     Review of Systems  Constitutional:   Negative for malaise/fatigue and weight loss.  HENT:  Negative for congestion and nosebleeds.   Respiratory:  Negative for cough and shortness of breath.   Gastrointestinal:  Negative for abdominal pain, blood in stool, constipation, diarrhea and melena.       No colitis flares  Genitourinary:  Negative for hematuria.  Musculoskeletal:  Negative for joint pain (Only mild nagging little pains) and myalgias.  Neurological:  Negative for dizziness and loss of consciousness.       30-minute amnestic episode as noted above.  Otherwise no associated symptoms  Psychiatric/Behavioral: Negative.      Past Medical History:  Diagnosis Date   Allergic rhinitis, cause unspecified    Anal stenosis    Asthma    Cataract    Chronic orthostatic hypotension    Related to borderline hypotension; intolerant of blood pressure medications.  On Midodrine   Coronary artery disease involving native coronary artery of native heart without angina pectoris 06/2018    p-mRCA 100% (DES PCI - p-mRCA 100% (DES PCI - Resolute Onyx 3.5 x 26 --> ~3.7 mm)), dRCA ~50% & PAV 40%; pRI 85% (Staged DES PCI  Resolute Onyx 2.25 x 15 - ~2.5 mm). 100% LAD CTO after small D1 with ost-prox 60%. R-L collaterals to LAD seen post PCI.   Crohn's colitis ([place])    Exostosis of unspecified site    GERD (gastroesophageal reflux disease)    History of acute inferior wall MI 06/2018   100% p-mRCA -- DES PCI (complicated by mild cardiogenic shock, partially related to 100% LAD CTO) --> staged PCI-RI   Hyperlipidemia with target LDL less than 70 07/04/2015   Now s/p MI with MV CAD - optimal LDL < 50   Hyperplastic colon polyp 02/26/2008   Hypertension    Ischemic cardiomyopathy    Initial echo shows EF of 40 to 45% with anterior hypokinesis. Cardiac MRI shows EF of 49% with anterior anteroapical hypokinesis/akinesis and LV thrombus.   Lateral epicondylitis  of elbow    Left ventricular thrombus following MI [facility]) ==> resolved as of January  2021 10/10/2018   Cardiac MRI 09/28/2018: Mildly dilated LV.  EF~ 49%.  Mid Anteriopr/Anteroseptal & Apical Anterior HK.  LV APICAL THROMBUS (partially  layered) 22 x 17 x 11. ==> b) f/u Echo 03/2019 -No LV Thrombus (NO LONGER ON WARFARIN)   Leukopenia    Macrocytic anemia    Related to azathioprine   Myocardial infarction [facility])    Nontraumatic rupture of patellar tendon    Obesity, unspecified    Regional enteritis of unspecified site    Seasonal allergies    Sessile colonic polyp    Vitamin B12 deficiency    Past Surgical History:  Procedure Laterality Date   CARDIAC MRI  09/2018   Mildly dilated LV.  EF estimated roughly 49%.  Hypokinesis of the mid anterior, anteroseptal and apical anterior wall.  There is also apical thrombus (partially layered) 22 x 17 x 11. -->  50-75% late gadolinium enhancement of the mid anterior, anteroseptal and apical anterior and septal walls suggestive of near full-thickness scar w/ minimal viability (poor recovery prognosis w/ REVASCULARIZATION   COLONOSCOPY     2016   CORONARY STENT INTERVENTION N/A 07/24/2018   Procedure: CORONARY STENT INTERVENTION;  Surgeon: Troy Sine, MD;  Location: [place] [facility];  Service: Cardiovascular: Staged PCI pRI 85-90%: Resolute Onyx DES 2.25 x 15 (2.5 mm)   CORONARY/GRAFT ACUTE MI REVASCULARIZATION N/A 07/23/2018   Procedure: CORONARY/GRAFT ACUTE MI REVASCULARIZATION;  Surgeon: [name], MD;  Location: [place] [facility];  Service: Cardiovascular;::  p-mRCA 100% (DES PCI - p-mRCA 100% (DES PCI - Resolute Onyx 3.5 x 26 (post-dilated ~3.7 mm)), dRCA ~50% & PAV 40%; pRI 85% (Staged PCI). 100% LAD CTO after small D1 with ost-prox 60%. R-L collaterals to LAD seen post PCI.;    LEFT HEART CATH N/A 07/24/2018   Procedure: LEFT HEART CATH;  Surgeon: Troy Sine, MD;  Location: [place] [facility];  Service: Cardiovascular;  Laterality: N/A; 85-90% pRCA, 60% ost D1 then 100% CTO of LAD -- staged DES PCI LAD   LEFT HEART  CATH AND CORONARY ANGIOGRAPHY  07/23/2018   Procedure: LEFT HEART CATH;  Surgeon: [name]., MD;  [facility]: [facility]   p-mRCA 100% (DES PCI),dRCA ~50% & PAV 40%; pRI 85% (Staged PCI). 100% LAD CTO after small D1 with ost-prox 60%. R-L collaterals to LAD seen post PCI.;    RIGHT HEART CATH N/A 07/23/2018   Procedure: RIGHT HEART CATH;  Surgeon: [name], MD;  Location: [place] [facility];  Service: Cardiovascular;  Normal RHC #s, CO/CI normal   TONSILLECTOMY AND ADENOIDECTOMY     TRANSTHORACIC ECHOCARDIOGRAM  07/23/2018   Insetting of inferior MI with occluded LAD CTO; --Severe akinesis of the mid anteroseptal wall.  EF 40 and 45%.  GR 1 DD.;;     TRANSTHORACIC ECHOCARDIOGRAM  04/06/2019   To follow-up EF and LV thrombus:  EF 40 and 45%.  Distal septal and apical akinesis.  No LV thrombus noted.  Mild LVH.  GR 1 DD.  Normal RV.  Mild LA dilation.  Normal RA.  Mild aortic valve sclerosis with no stenosis - mild AI   --> warfarin discontinued.  Continued on Plavix alone.     Current Meds  Medication Sig   Adalimumab (HUMIRA PEN) 40 MG/0.4ML PNKT Inject 40 mg into the skin every 14 (fourteen) days.   cholecalciferol (VITAMIN D3) 25 MCG (1000 UT) tablet Take 1,000 Units by mouth daily.   clopidogrel (PLAVIX) 75 MG tablet TAKE 1 TABLET ONCE DAILY.   Coenzyme Q10 (CO Q 10 PO) Take 100 mg by mouth.   fluticasone-salmeterol (ADVAIR HFA)  115-21 MCG/ACT inhaler Inhale 2 puffs into the lungs 2 (two) times daily. Shake well   folic acid (FOLVITE) 1 MG tablet Take 1 mg by mouth daily.   hydrocortisone 2.5 % cream Apply 1 application topically daily.    irbesartan (AVAPRO) 75 MG tablet Take 37.5 mg by mouth daily.   loperamide (IMODIUM A-D) 2 MG tablet Take 2 mg by mouth 4 (four) times daily as needed for diarrhea or loose stools.   metoprolol succinate (TOPROL-XL) 25 MG 24 hr tablet TAKE 1 TABLET ONCE DAILY.   midodrine (PROAMATINE) 2.5 MG tablet TAKE 1 TAB IN AM. MAY TAKE AN  ADDITIONAL TAB AFTER 6HR IF STILL DIZZY/SYSTOLIC JH<41   montelukast (SINGULAIR) 10 MG tablet TAKE ONE TABLET AT BEDTIME.   Multiple Vitamins-Minerals (CENTRUM SILVER PO) Take 1 tablet by mouth daily.   nitroGLYCERIN (NITROSTAT) 0.4 MG SL tablet 1 TAB UNDER TONGUE AS NEEDED FOR CHEST PAIN. MAY REPEAT EVERY 5 MIN FOR A TOTAL OF 3 DOSES.   pantoprazole (PROTONIX) 40 MG tablet TAKE ONE TABLET ONCE DAILY.   rosuvastatin (CRESTOR) 40 MG tablet TAKE 1 TABLET ONCE DAILY.   traMADol (ULTRAM) 50 MG tablet TAKE ONE TABLET EVERY 6 HOURS AS NEEDED.   VASCEPA 1 g capsule TAKE (2) CAPSULES BY MOUTH TWICE DAILY.   VENTOLIN HFA 108 (90 Base) MCG/ACT inhaler USE 2 PUFFS EVERY 6 HOURS AS NEEDED FOR WHEEZING.   vitamin B-12 (CYANOCOBALAMIN) 1000 MCG tablet Take 1 tablet (1,000 mcg total) by mouth daily.   zolpidem (AMBIEN) 10 MG tablet Take 1 tablet (10 mg total) by mouth at bedtime as needed.     Allergies:   Allopurinol, Asacol [mesalamine], Mercaptopurine, Amoxicillin, Penicillins, and Sulfamethoxazole   Social History   Tobacco Use   Smoking status: Never   Smokeless tobacco: Never  Vaping Use   Vaping Use: Never used  Substance Use Topics   Alcohol use: Yes    Alcohol/week: 0.0 standard drinks    Comment: rare   Drug use: No     Family Hx: The patient's family history includes Diabetes in his brother and father; Heart attack in his father; Heart disease in his father; Hypertension in his father. There is no history of Colon cancer, Stomach cancer, Hyperlipidemia, Sudden death, Cancer, COPD, Alcohol abuse, Drug abuse, Stroke, Rectal cancer, or Esophageal cancer.   Labs/Other Tests and Data Reviewed:    EKG:  An ECG dated 09/12/2020 was personally reviewed today and demonstrated:  normal sinus rhythm, rate 72 bpm.  Septal infarct, age undetermined.  In this EKG, the R waves in I and aVL are now upright.  When compared to the EKG from 09/06/2020 done at PCPs office that shows sinus rhythm, septal  infarct and possible lateral infarct because of R waves being inverted and I, II and aVL & upright in aVR-> the current EKG is now back to his baseline.  This would indicate the likely limb lead reversal on the outside EKG.  Recent Labs: 11/08/2019: ALT 28; TSH 1.16 09/06/2020: BUN 20; Creatinine, Ser 1.10; Hemoglobin 15.4; Platelets 266.0; Potassium 4.1; Pro B Natriuretic peptide (BNP) 15.0; Sodium 138 ;  Lab Results  Component Value Date   HGBA1C 6.2 09/06/2020     Recent Lipid Panel Lab Results  Component Value Date/Time   CHOL 162 11/08/2019 09:13 AM   TRIG 198 (H) 11/08/2019 09:13 AM   HDL 72 11/08/2019 09:13 AM   CHOLHDL 2.3 11/08/2019 09:13 AM   LDLCALC 63 11/08/2019 09:13 AM  Wt Readings from Last 3 Encounters:  09/14/20 182 lb (82.6 kg)  09/12/20 182 lb 12.8 oz (82.9 kg)  09/06/20 185 lb (83.9 kg)     Objective:    Vital Signs:  BP 102/62   Pulse 72   Ht 5' 6"  (1.676 m)   Wt 182 lb (82.6 kg)   BMI 29.38 kg/m   VITAL SIGNS:  reviewed GEN:  no acute distress RESPIRATORY:  non-labored NEURO:  alert and oriented x 3, no obvious focal deficit PSYCH:  normal affect  ==========================================  COVID-19 Education: The signs and symptoms of COVID-19 were discussed with the patient and how to seek care for testing (follow up with PCP or arrange E-visit).   The importance of social distancing was discussed today.  Time:   Today, I have spent 18 minutes with the patient with telehealth technology discussing the above problems.   An additional 63mnutes spent charting (reviewing prior notes, hospital records, studies, labs etc.) Total 24 minutes   Medication Adjustments/Labs and Tests Ordered: Current medicines are reviewed at length with the patient today.  Concerns regarding medicines are outlined above.   Patient Instructions  Medication Instructions:  None  *If you need a refill on your cardiac medications before your next appointment,  please call your pharmacy*   Lab Work: Continue to have routine lab work with PCP    Testing/Procedures: None   Follow-Up: At [facility] you and your health needs are our priority.  As part of our continuing mission to provide you with exceptional heart care, we have created designated Provider Care Teams.  These Care Teams include your primary Cardiologist (physician) and Advanced Practice Providers (APPs -  Physician Assistants and Nurse Practitioners) who all work together to provide you with the care you need, when you need it.   Your next appointment:   9 month(s) -> originally scheduled to be seen in September, pushing out to March 2023  The format for your next appointment:   In Person  Provider:   DGlenetta Hew MD   Other Instructions Keep staying active, good luck if you retire.   Signed, DGlenetta Hew MD  09/14/2020 3:15 PM    [place]

## 2020-09-14 NOTE — Telephone Encounter (Signed)
  Patient Consent for Virtual Visit        Darryl [name] has provided verbal consent on 09/14/2020 for a virtual visit (video or telephone).   CONSENT FOR VIRTUAL VISIT FOR:  Darryl [name]  By participating in this virtual visit I agree to the following:  I hereby voluntarily request, consent and authorize [facility] and its employed or contracted physicians, physician assistants, nurse practitioners or other licensed health care professionals (the Practitioner), to provide me with telemedicine health care services (the "Services") as deemed necessary by the treating Practitioner. I acknowledge and consent to receive the Services by the Practitioner via telemedicine. I understand that the telemedicine visit will involve communicating with the Practitioner through live audiovisual communication technology and the disclosure of certain medical information by electronic transmission. I acknowledge that I have been given the opportunity to request an in-person assessment or other available alternative prior to the telemedicine visit and am voluntarily participating in the telemedicine visit.  I understand that I have the right to withhold or withdraw my consent to the use of telemedicine in the course of my care at any time, without affecting my right to future care or treatment, and that the Practitioner or I may terminate the telemedicine visit at any time. I understand that I have the right to inspect all information obtained and/or recorded in the course of the telemedicine visit and may receive copies of available information for a reasonable fee.  I understand that some of the potential risks of receiving the Services via telemedicine include:  Delay or interruption in medical evaluation due to technological equipment failure or disruption; Information transmitted may not be sufficient (e.g. poor resolution of images) to allow for appropriate medical decision making by the Practitioner; and/or   In rare instances, security protocols could fail, causing a breach of personal health information.  Furthermore, I acknowledge that it is my responsibility to provide information about my medical history, conditions and care that is complete and accurate to the best of my ability. I acknowledge that Practitioner's advice, recommendations, and/or decision may be based on factors not within their control, such as incomplete or inaccurate data provided by me or distortions of diagnostic images or specimens that may result from electronic transmissions. I understand that the practice of medicine is not an exact science and that Practitioner makes no warranties or guarantees regarding treatment outcomes. I acknowledge that a copy of this consent can be made available to me via my patient portal ([place]), or I can request a printed copy by calling the office of [facility].    I understand that my insurance will be billed for this visit.   I have read or had this consent read to me. I understand the contents of this consent, which adequately explains the benefits and risks of the Services being provided via telemedicine.  I have been provided ample opportunity to ask questions regarding this consent and the Services and have had my questions answered to my satisfaction. I give my informed consent for the services to be provided through the use of telemedicine in my medical care

## 2020-09-14 NOTE — Assessment & Plan Note (Signed)
The episode in question sounds less like a TIA and more like [employer]ient [employer] Amnesia.  Thankfully, no further episodes and his MRI of the brain was normal.  Continue to monitor.

## 2020-09-14 NOTE — Assessment & Plan Note (Addendum)
EF now up to 40 and 45% with a large anterior defect secondary to LAD occlusion.  MRI did not show viability in this area and therefore not reasonable to consider CTO. Not really having any CHF symptoms-class I at most.  Plan: Continue Toprol and Avapro.  No diuretic requirement-would prefer not to use diuretic especially since he has had some orthostatic symptoms in the past..

## 2020-09-14 NOTE — Assessment & Plan Note (Signed)
Occluded LAD with PCI to the RCA and RI.  On maintenance Plavix as he is no longer on warfarin for LV apical thrombus.  Not on aspirin because of concern for GI bleed with Crohn's.   On maintenance dose Plavix 75 mg-okay to hold 5-7 days preop for surgical procedures.  (7 days for more high risk procedures such as neurologic, spinal or organ biopsy)  He will also hold Plavix temporarily 3 to 5 days) if he has GI bleeding from Crohn's

## 2020-09-14 NOTE — Patient Instructions (Addendum)
Medication Instructions:  None  *If you need a refill on your cardiac medications before your next appointment, please call your pharmacy*   Lab Work: Continue to have routine lab work with PCP    Testing/Procedures: None   Follow-Up: At [facility], you and your health needs are our priority.  As part of our continuing mission to provide you with exceptional heart care, we have created designated Provider Care Teams.  These Care Teams include your primary Cardiologist (physician) and Advanced Practice Providers (APPs -  Physician Assistants and Nurse Practitioners) who all work together to provide you with the care you need, when you need it.   Your next appointment:   9 month(s) -> originally scheduled to be seen in September, pushing out to March 2023  The format for your next appointment:   In Person  Provider:   Glenetta Hew, MD   Other Instructions Keep staying active, good luck if you retire.

## 2020-09-24 ENCOUNTER — Other Ambulatory Visit: Payer: Self-pay | Admitting: Internal Medicine

## 2020-10-05 DIAGNOSIS — L309 Dermatitis, unspecified: Secondary | ICD-10-CM | POA: Diagnosis not present

## 2020-10-05 DIAGNOSIS — L218 Other seborrheic dermatitis: Secondary | ICD-10-CM | POA: Diagnosis not present

## 2020-10-05 DIAGNOSIS — L0889 Other specified local infections of the skin and subcutaneous tissue: Secondary | ICD-10-CM | POA: Diagnosis not present

## 2020-11-02 DIAGNOSIS — L309 Dermatitis, unspecified: Secondary | ICD-10-CM | POA: Diagnosis not present

## 2020-11-02 DIAGNOSIS — C44729 Squamous cell carcinoma of skin of left lower limb, including hip: Secondary | ICD-10-CM | POA: Diagnosis not present

## 2020-11-02 DIAGNOSIS — D485 Neoplasm of uncertain behavior of skin: Secondary | ICD-10-CM | POA: Diagnosis not present

## 2020-11-06 ENCOUNTER — Ambulatory Visit: Payer: Medicare PPO

## 2020-11-20 LAB — HM DIABETES EYE EXAM

## 2020-11-23 ENCOUNTER — Other Ambulatory Visit: Payer: Self-pay | Admitting: Internal Medicine

## 2020-11-29 DIAGNOSIS — M25551 Pain in right hip: Secondary | ICD-10-CM | POA: Diagnosis not present

## 2020-11-29 DIAGNOSIS — M545 Low back pain, unspecified: Secondary | ICD-10-CM | POA: Diagnosis not present

## 2020-12-04 DIAGNOSIS — B078 Other viral warts: Secondary | ICD-10-CM | POA: Diagnosis not present

## 2020-12-04 DIAGNOSIS — Z85828 Personal history of other malignant neoplasm of skin: Secondary | ICD-10-CM | POA: Diagnosis not present

## 2020-12-04 DIAGNOSIS — L22 Diaper dermatitis: Secondary | ICD-10-CM | POA: Diagnosis not present

## 2020-12-04 DIAGNOSIS — L308 Other specified dermatitis: Secondary | ICD-10-CM | POA: Diagnosis not present

## 2020-12-04 DIAGNOSIS — D485 Neoplasm of uncertain behavior of skin: Secondary | ICD-10-CM | POA: Diagnosis not present

## 2020-12-06 DIAGNOSIS — H01009 Unspecified blepharitis unspecified eye, unspecified eyelid: Secondary | ICD-10-CM | POA: Diagnosis not present

## 2020-12-06 DIAGNOSIS — H04123 Dry eye syndrome of bilateral lacrimal glands: Secondary | ICD-10-CM | POA: Diagnosis not present

## 2020-12-06 DIAGNOSIS — H18593 Other hereditary corneal dystrophies, bilateral: Secondary | ICD-10-CM | POA: Diagnosis not present

## 2020-12-11 DIAGNOSIS — M6281 Muscle weakness (generalized): Secondary | ICD-10-CM | POA: Diagnosis not present

## 2020-12-11 DIAGNOSIS — M7061 Trochanteric bursitis, right hip: Secondary | ICD-10-CM | POA: Diagnosis not present

## 2020-12-11 DIAGNOSIS — M25651 Stiffness of right hip, not elsewhere classified: Secondary | ICD-10-CM | POA: Diagnosis not present

## 2020-12-14 DIAGNOSIS — M6281 Muscle weakness (generalized): Secondary | ICD-10-CM | POA: Diagnosis not present

## 2020-12-14 DIAGNOSIS — M7061 Trochanteric bursitis, right hip: Secondary | ICD-10-CM | POA: Diagnosis not present

## 2020-12-14 DIAGNOSIS — M25651 Stiffness of right hip, not elsewhere classified: Secondary | ICD-10-CM | POA: Diagnosis not present

## 2020-12-20 DIAGNOSIS — M6281 Muscle weakness (generalized): Secondary | ICD-10-CM | POA: Diagnosis not present

## 2020-12-20 DIAGNOSIS — H02886 Meibomian gland dysfunction of left eye, unspecified eyelid: Secondary | ICD-10-CM | POA: Diagnosis not present

## 2020-12-20 DIAGNOSIS — M7061 Trochanteric bursitis, right hip: Secondary | ICD-10-CM | POA: Diagnosis not present

## 2020-12-20 DIAGNOSIS — H04123 Dry eye syndrome of bilateral lacrimal glands: Secondary | ICD-10-CM | POA: Diagnosis not present

## 2020-12-20 DIAGNOSIS — H02883 Meibomian gland dysfunction of right eye, unspecified eyelid: Secondary | ICD-10-CM | POA: Diagnosis not present

## 2020-12-20 DIAGNOSIS — L719 Rosacea, unspecified: Secondary | ICD-10-CM | POA: Diagnosis not present

## 2020-12-20 DIAGNOSIS — M25651 Stiffness of right hip, not elsewhere classified: Secondary | ICD-10-CM | POA: Diagnosis not present

## 2020-12-22 DIAGNOSIS — M25651 Stiffness of right hip, not elsewhere classified: Secondary | ICD-10-CM | POA: Diagnosis not present

## 2020-12-22 DIAGNOSIS — M6281 Muscle weakness (generalized): Secondary | ICD-10-CM | POA: Diagnosis not present

## 2020-12-22 DIAGNOSIS — M7061 Trochanteric bursitis, right hip: Secondary | ICD-10-CM | POA: Diagnosis not present

## 2020-12-28 DIAGNOSIS — M7061 Trochanteric bursitis, right hip: Secondary | ICD-10-CM | POA: Diagnosis not present

## 2020-12-28 DIAGNOSIS — M6281 Muscle weakness (generalized): Secondary | ICD-10-CM | POA: Diagnosis not present

## 2020-12-28 DIAGNOSIS — M25651 Stiffness of right hip, not elsewhere classified: Secondary | ICD-10-CM | POA: Diagnosis not present

## 2021-01-08 DIAGNOSIS — M25651 Stiffness of right hip, not elsewhere classified: Secondary | ICD-10-CM | POA: Diagnosis not present

## 2021-01-08 DIAGNOSIS — M7061 Trochanteric bursitis, right hip: Secondary | ICD-10-CM | POA: Diagnosis not present

## 2021-01-08 DIAGNOSIS — M6281 Muscle weakness (generalized): Secondary | ICD-10-CM | POA: Diagnosis not present

## 2021-01-15 DIAGNOSIS — M6281 Muscle weakness (generalized): Secondary | ICD-10-CM | POA: Diagnosis not present

## 2021-01-15 DIAGNOSIS — M25651 Stiffness of right hip, not elsewhere classified: Secondary | ICD-10-CM | POA: Diagnosis not present

## 2021-01-15 DIAGNOSIS — M7061 Trochanteric bursitis, right hip: Secondary | ICD-10-CM | POA: Diagnosis not present

## 2021-01-17 DIAGNOSIS — M7061 Trochanteric bursitis, right hip: Secondary | ICD-10-CM | POA: Diagnosis not present

## 2021-01-17 DIAGNOSIS — M25651 Stiffness of right hip, not elsewhere classified: Secondary | ICD-10-CM | POA: Diagnosis not present

## 2021-01-17 DIAGNOSIS — M6281 Muscle weakness (generalized): Secondary | ICD-10-CM | POA: Diagnosis not present

## 2021-01-18 DIAGNOSIS — H04123 Dry eye syndrome of bilateral lacrimal glands: Secondary | ICD-10-CM | POA: Diagnosis not present

## 2021-01-19 ENCOUNTER — Other Ambulatory Visit: Payer: Self-pay | Admitting: Cardiology

## 2021-01-26 ENCOUNTER — Other Ambulatory Visit: Payer: Self-pay | Admitting: Internal Medicine

## 2021-01-26 DIAGNOSIS — J4520 Mild intermittent asthma, uncomplicated: Secondary | ICD-10-CM

## 2021-01-29 DIAGNOSIS — M6281 Muscle weakness (generalized): Secondary | ICD-10-CM | POA: Diagnosis not present

## 2021-01-29 DIAGNOSIS — M25651 Stiffness of right hip, not elsewhere classified: Secondary | ICD-10-CM | POA: Diagnosis not present

## 2021-01-29 DIAGNOSIS — M7061 Trochanteric bursitis, right hip: Secondary | ICD-10-CM | POA: Diagnosis not present

## 2021-01-31 ENCOUNTER — Other Ambulatory Visit: Payer: Self-pay

## 2021-01-31 ENCOUNTER — Ambulatory Visit (INDEPENDENT_AMBULATORY_CARE_PROVIDER_SITE_OTHER): Payer: Medicare PPO

## 2021-01-31 DIAGNOSIS — Z23 Encounter for immunization: Secondary | ICD-10-CM | POA: Diagnosis not present

## 2021-01-31 DIAGNOSIS — M7061 Trochanteric bursitis, right hip: Secondary | ICD-10-CM | POA: Diagnosis not present

## 2021-01-31 DIAGNOSIS — M25651 Stiffness of right hip, not elsewhere classified: Secondary | ICD-10-CM | POA: Diagnosis not present

## 2021-01-31 DIAGNOSIS — M6281 Muscle weakness (generalized): Secondary | ICD-10-CM | POA: Diagnosis not present

## 2021-01-31 NOTE — Progress Notes (Signed)
Pt was given HD flu vacc w/o any complications.

## 2021-02-06 DIAGNOSIS — M6281 Muscle weakness (generalized): Secondary | ICD-10-CM | POA: Diagnosis not present

## 2021-02-06 DIAGNOSIS — M25651 Stiffness of right hip, not elsewhere classified: Secondary | ICD-10-CM | POA: Diagnosis not present

## 2021-02-06 DIAGNOSIS — M7061 Trochanteric bursitis, right hip: Secondary | ICD-10-CM | POA: Diagnosis not present

## 2021-02-09 DIAGNOSIS — M6281 Muscle weakness (generalized): Secondary | ICD-10-CM | POA: Diagnosis not present

## 2021-02-09 DIAGNOSIS — M25651 Stiffness of right hip, not elsewhere classified: Secondary | ICD-10-CM | POA: Diagnosis not present

## 2021-02-09 DIAGNOSIS — M7061 Trochanteric bursitis, right hip: Secondary | ICD-10-CM | POA: Diagnosis not present

## 2021-02-12 DIAGNOSIS — M6281 Muscle weakness (generalized): Secondary | ICD-10-CM | POA: Diagnosis not present

## 2021-02-12 DIAGNOSIS — M25651 Stiffness of right hip, not elsewhere classified: Secondary | ICD-10-CM | POA: Diagnosis not present

## 2021-02-12 DIAGNOSIS — M7061 Trochanteric bursitis, right hip: Secondary | ICD-10-CM | POA: Diagnosis not present

## 2021-02-14 DIAGNOSIS — M25651 Stiffness of right hip, not elsewhere classified: Secondary | ICD-10-CM | POA: Diagnosis not present

## 2021-02-14 DIAGNOSIS — M6281 Muscle weakness (generalized): Secondary | ICD-10-CM | POA: Diagnosis not present

## 2021-02-14 DIAGNOSIS — M7061 Trochanteric bursitis, right hip: Secondary | ICD-10-CM | POA: Diagnosis not present

## 2021-02-19 ENCOUNTER — Telehealth: Payer: Self-pay | Admitting: Internal Medicine

## 2021-02-19 DIAGNOSIS — L309 Dermatitis, unspecified: Secondary | ICD-10-CM | POA: Diagnosis not present

## 2021-02-19 NOTE — Telephone Encounter (Signed)
Type of form received (Home Health, FMLA, disability, handicapped placard, Surgical clearance) home health   Form placed in (E-fax folder, Provider mailbox)  Provider mailbox   Additional instructions from the patient (mail, fax, notify by phone when complete)  Fax to [facility]   Things to remember: [place] office: If form received in person, remind patient that forms take 7-10 business days CMA should attach charge sheet and put on [employer]

## 2021-02-20 DIAGNOSIS — M6281 Muscle weakness (generalized): Secondary | ICD-10-CM | POA: Diagnosis not present

## 2021-02-20 DIAGNOSIS — M25651 Stiffness of right hip, not elsewhere classified: Secondary | ICD-10-CM | POA: Diagnosis not present

## 2021-02-20 DIAGNOSIS — M7061 Trochanteric bursitis, right hip: Secondary | ICD-10-CM | POA: Diagnosis not present

## 2021-02-20 NOTE — Telephone Encounter (Signed)
Called pt, LVM needing clarification on a couple questions listed on the form.

## 2021-02-20 NOTE — Telephone Encounter (Signed)
I have spoke to pt in regard to his ability to ambulate and bathe. I have completed form and given to PCP to review and sign.

## 2021-02-22 NOTE — Telephone Encounter (Signed)
Forms have been faxed back to [facility].

## 2021-03-05 ENCOUNTER — Other Ambulatory Visit: Payer: Self-pay | Admitting: Internal Medicine

## 2021-03-05 ENCOUNTER — Telehealth: Payer: Self-pay | Admitting: Internal Medicine

## 2021-03-05 MED ORDER — PANTOPRAZOLE SODIUM 40 MG PO TBEC: 40.0000 mg | DELAYED_RELEASE_TABLET | Freq: Every day | ORAL | 0 refills | Status: DC

## 2021-03-05 NOTE — Telephone Encounter (Signed)
Patient requesting refill for pantoprazole (PROTONIX) 40 MG tablet  Patient was last seen 09-06-2020  Pharmacy [facility], [place]

## 2021-03-09 ENCOUNTER — Other Ambulatory Visit: Payer: Self-pay | Admitting: Internal Medicine

## 2021-03-09 DIAGNOSIS — I2510 Atherosclerotic heart disease of native coronary artery without angina pectoris: Secondary | ICD-10-CM

## 2021-03-09 DIAGNOSIS — E781 Pure hyperglyceridemia: Secondary | ICD-10-CM

## 2021-03-29 ENCOUNTER — Encounter: Payer: Self-pay | Admitting: Family Medicine

## 2021-03-29 ENCOUNTER — Telehealth (INDEPENDENT_AMBULATORY_CARE_PROVIDER_SITE_OTHER): Payer: Medicare PPO | Admitting: Family Medicine

## 2021-03-29 ENCOUNTER — Other Ambulatory Visit: Payer: Self-pay

## 2021-03-29 DIAGNOSIS — U071 COVID-19: Secondary | ICD-10-CM | POA: Diagnosis not present

## 2021-03-29 MED ORDER — MOLNUPIRAVIR EUA 200MG CAPSULE: 4.0000 | ORAL_CAPSULE | Freq: Two times a day (BID) | ORAL | 0 refills | Status: DC

## 2021-03-29 NOTE — Progress Notes (Signed)
Virtual Visit via Video Note  I connected with Darryl [name]  on 03/29/21 at 10:40 AM EST by a video enabled telemedicine application and verified that I am speaking with the correct person using two identifiers.  Location patient: [place] Location provider:work or home office Persons participating in the virtual visit: patient, provider  I discussed the limitations and requested verbal permission for telemedicine visit. The patient expressed understanding and agreed to proceed.   HPI:  Acute telemedicine visit for Covid19: -Onset: had a cold over christmas, but was getting better then worse the last 2 days and tested positive for covid at that time -wife has covid as well -Symptoms include: nasal congestion, cough, HA 2 days ago, fatigue, felt dizzy 2 days ago  - now feeling better, still has a cough and is sneezing -Denies: fever, CP, SOB, dizziness any longer, HA any longer, NVD -inability to eat or drink -Pertinent past medical history: see below, had covid last year  -Pertinent medication allergies: Allergies  Allergen Reactions   Allopurinol Other (See Comments)    Bone marrow suppression   Asacol [Mesalamine] Hives    Trouble swallowing, loss of appetite   Mercaptopurine Other (See Comments)    Bone marrow suppression   Amoxicillin Rash    Did it involve swelling of the face/tongue/throat, SOB, or low BP? Unknown Did it involve sudden or severe rash/hives, skin peeling, or any reaction on the inside of your mouth or nose? Unknown Did you need to seek medical attention at a hospital or doctor's office? Unknown When did it last happen?   b/c of penicillin allergy    If all above answers are NO, may proceed with cephalosporin use.    Penicillins Rash    Did it involve swelling of the face/tongue/throat, SOB, or low BP? Yes Did it involve sudden or severe rash/hives, skin peeling, or any reaction on the inside of your mouth or nose? No Did you need to seek medical attention at a  hospital or doctor's office? No When did it last happen?      more than 15 yrs If all above answers are NO, may proceed with cephalosporin use.    Sulfamethoxazole Rash  -COVID-19 vaccine status:  Immunization History  Administered Date(s) Administered   Fluad Quad(high Dose 65+) 01/21/2019, 01/31/2021   Hepb-cpg 10/12/2018, 11/13/2018   Influenza, High Dose Seasonal PF 01/07/2017, 02/23/2018   Influenza,inj,Quad PF,6+ Mos 02/05/2013, 02/01/2020   PFIZER(Purple Top)SARS-COV-2 Vaccination 05/01/2019, 05/22/2019, 11/23/2019   PPD Test 11/26/2011   Pneumococcal Conjugate-13 07/04/2015   Pneumococcal Polysaccharide-23 05/19/2013, 02/04/2019   Tdap 05/19/2013   Zoster Recombinat (Shingrix) 12/14/2019, 04/14/2020     ROS: See pertinent positives and negatives per HPI.  Past Medical History:  Diagnosis Date   Allergic rhinitis, cause unspecified    Anal stenosis    Asthma    Cataract    Chronic orthostatic hypotension    Related to borderline hypotension; intolerant of blood pressure medications.  On Midodrine   Coronary artery disease involving native coronary artery of native heart without angina pectoris 06/2018    p-mRCA 100% (DES PCI - p-mRCA 100% (DES PCI - Resolute Onyx 3.5 x 26 --> ~3.7 mm)), dRCA ~50% & PAV 40%; pRI 85% (Staged DES PCI  Resolute Onyx 2.25 x 15 - ~2.5 mm). 100% LAD CTO after small D1 with ost-prox 60%. R-L collaterals to LAD seen post PCI.   Crohn's colitis ([place])    Exostosis of unspecified site    GERD (gastroesophageal reflux disease)  History of acute inferior wall MI 06/2018   100% p-mRCA -- DES PCI (complicated by mild cardiogenic shock, partially related to 100% LAD CTO) --> staged PCI-RI   Hyperlipidemia with target LDL less than 70 07/04/2015   Now s/p MI with MV CAD - optimal LDL < 50   Hyperplastic colon polyp 02/26/2008   Hypertension    Ischemic cardiomyopathy    Initial echo shows EF of 40 to 45% with anterior hypokinesis. Cardiac MRI shows  EF of 49% with anterior anteroapical hypokinesis/akinesis and LV thrombus.   Lateral epicondylitis  of elbow    Left ventricular thrombus following MI [facility]) ==> resolved as of January 2021 10/10/2018   Cardiac MRI 09/28/2018: Mildly dilated LV.  EF~ 49%.  Mid Anteriopr/Anteroseptal & Apical Anterior HK.  LV APICAL THROMBUS (partially layered) 22 x 17 x 11. ==> b) f/u Echo 03/2019 -No LV Thrombus (NO LONGER ON WARFARIN)   Leukopenia    Macrocytic anemia    Related to azathioprine   Myocardial infarction [facility])    Nontraumatic rupture of patellar tendon    Obesity, unspecified    Regional enteritis of unspecified site    Seasonal allergies    Sessile colonic polyp    Vitamin B12 deficiency     Past Surgical History:  Procedure Laterality Date   CARDIAC MRI  09/2018   Mildly dilated LV.  EF estimated roughly 49%.  Hypokinesis of the mid anterior, anteroseptal and apical anterior wall.  There is also apical thrombus (partially layered) 22 x 17 x 11. -->  50-75% late gadolinium enhancement of the mid anterior, anteroseptal and apical anterior and septal walls suggestive of near full-thickness scar w/ minimal viability (poor recovery prognosis w/ REVASCULARIZATION   COLONOSCOPY     2016   CORONARY STENT INTERVENTION N/A 07/24/2018   Procedure: CORONARY STENT INTERVENTION;  Surgeon: Troy Sine, MD;  Location: [place] [facility];  Service: Cardiovascular: Staged PCI pRI 85-90%: Resolute Onyx DES 2.25 x 15 (2.5 mm)   CORONARY/GRAFT ACUTE MI REVASCULARIZATION N/A 07/23/2018   Procedure: CORONARY/GRAFT ACUTE MI REVASCULARIZATION;  Surgeon: [name], MD;  Location: [place] [facility];  Service: Cardiovascular;::  p-mRCA 100% (DES PCI - p-mRCA 100% (DES PCI - Resolute Onyx 3.5 x 26 (post-dilated ~3.7 mm)), dRCA ~50% & PAV 40%; pRI 85% (Staged PCI). 100% LAD CTO after small D1 with ost-prox 60%. R-L collaterals to LAD seen post PCI.;    LEFT HEART CATH N/A 07/24/2018   Procedure: LEFT HEART CATH;   Surgeon: Troy Sine, MD;  Location: [place] [facility];  Service: Cardiovascular;  Laterality: N/A; 85-90% pRCA, 60% ost D1 then 100% CTO of LAD -- staged DES PCI LAD   LEFT HEART CATH AND CORONARY ANGIOGRAPHY  07/23/2018   Procedure: LEFT HEART CATH;  Surgeon: [name]., MD;  Location: [facility]   p-mRCA 100% (DES PCI),dRCA ~50% & PAV 40%; pRI 85% (Staged PCI). 100% LAD CTO after small D1 with ost-prox 60%. R-L collaterals to LAD seen post PCI.;    RIGHT HEART CATH N/A 07/23/2018   Procedure: RIGHT HEART CATH;  Surgeon: [name], MD;  Location: [place] [facility];  Service: Cardiovascular;  Normal RHC #s, CO/CI normal   TONSILLECTOMY AND ADENOIDECTOMY     TRANSTHORACIC ECHOCARDIOGRAM  07/23/2018   Insetting of inferior MI with occluded LAD CTO; --Severe akinesis of the mid anteroseptal wall.  EF 40 and 45%.  GR 1 DD.;;  TRANSTHORACIC ECHOCARDIOGRAM  04/06/2019   To follow-up EF and LV thrombus:  EF 40 and 45%.  Distal septal and apical akinesis.  No LV thrombus noted.  Mild LVH.  GR 1 DD.  Normal RV.  Mild LA dilation.  Normal RA.  Mild aortic valve sclerosis with no stenosis - mild AI   --> warfarin discontinued.  Continued on Plavix alone.     Current Outpatient Medications:    Adalimumab (HUMIRA PEN) 40 MG/0.4ML PNKT, Inject 40 mg into the skin every 14 (fourteen) days., Disp: 2 each, Rfl: 11   ADVAIR HFA 115-21 MCG/ACT inhaler, USE 2 PUFFS TWICE DAILY AS NEEDED. SHAKE WELL, Disp: 12 g, Rfl: 1   cholecalciferol (VITAMIN D3) 25 MCG (1000 UT) tablet, Take 1,000 Units by mouth daily., Disp: , Rfl:    clopidogrel (PLAVIX) 75 MG tablet, TAKE 1 TABLET ONCE DAILY., Disp: 90 tablet, Rfl: 1   Coenzyme Q10 (CO Q 10 PO), Take 100 mg by mouth., Disp: , Rfl:    folic acid (FOLVITE) 1 MG tablet, Take 1 mg by mouth daily., Disp: , Rfl:    hydrocortisone 2.5 % cream, Apply 1 application topically daily. , Disp: , Rfl:    irbesartan (AVAPRO) 75 MG tablet, TAKE 1 TABLET BY  MOUTH DAILY. (Patient taking differently: 37.5 mg.), Disp: 90 tablet, Rfl: 1   loperamide (IMODIUM A-D) 2 MG tablet, Take 2 mg by mouth 4 (four) times daily as needed for diarrhea or loose stools., Disp: , Rfl:    metoprolol succinate (TOPROL-XL) 25 MG 24 hr tablet, TAKE 1 TABLET ONCE DAILY., Disp: 90 tablet, Rfl: 3   midodrine (PROAMATINE) 2.5 MG tablet, TAKE 1 TAB IN AM. MAY TAKE AN ADDITIONAL TAB AFTER 6HR IF STILL DIZZY/SYSTOLIC UE<45, Disp: 40 tablet, Rfl: 1   molnupiravir EUA (LAGEVRIO) 200 mg CAPS capsule, Take 4 capsules (800 mg total) by mouth 2 (two) times daily for 5 days., Disp: 40 capsule, Rfl: 0   montelukast (SINGULAIR) 10 MG tablet, TAKE ONE TABLET AT BEDTIME., Disp: 90 tablet, Rfl: 1   Multiple Vitamins-Minerals (CENTRUM SILVER PO), Take 1 tablet by mouth daily., Disp: , Rfl:    nitroGLYCERIN (NITROSTAT) 0.4 MG SL tablet, 1 TAB UNDER TONGUE AS NEEDED FOR CHEST PAIN. MAY REPEAT EVERY 5 MIN FOR A TOTAL OF 3 DOSES., Disp: 25 tablet, Rfl: 3   pantoprazole (PROTONIX) 40 MG tablet, TAKE ONE TABLET ONCE DAILY (Patient taking differently: as needed.), Disp: 30 tablet, Rfl: 0   rosuvastatin (CRESTOR) 40 MG tablet, TAKE 1 TABLET ONCE DAILY., Disp: 90 tablet, Rfl: 3   traMADol (ULTRAM) 50 MG tablet, TAKE ONE TABLET EVERY 6 HOURS AS NEEDED., Disp: 30 tablet, Rfl: 0   VASCEPA 1 g capsule, TAKE (2) CAPSULES BY MOUTH TWICE DAILY., Disp: 360 capsule, Rfl: 1   VENTOLIN HFA 108 (90 Base) MCG/ACT inhaler, USE 2 PUFFS EVERY 6 HOURS AS NEEDED FOR WHEEZING., Disp: 18 g, Rfl: 3   vitamin B-12 (CYANOCOBALAMIN) 1000 MCG tablet, Take 1 tablet (1,000 mcg total) by mouth daily., Disp: , Rfl:    zolpidem (AMBIEN) 10 MG tablet, Take 1 tablet (10 mg total) by mouth at bedtime as needed., Disp: 30 tablet, Rfl: 0  EXAM:  VITALS per patient if applicable:  GENERAL: alert, oriented, appears well and in no acute distress  HEENT: atraumatic, conjunttiva clear, no obvious abnormalities on inspection of external  nose and ears  NECK: normal movements of the head and neck  LUNGS: on inspection no signs of respiratory  distress, breathing rate appears normal, no obvious gross SOB, gasping or wheezing  CV: no obvious cyanosis  MS: moves all visible extremities without noticeable abnormality  PSYCH/NEURO: pleasant and cooperative, no obvious depression or anxiety, speech and thought processing grossly intact  ASSESSMENT AND PLAN:  Discussed the following assessment and plan:  COVID-19   Discussed treatment options and risk of drug interactions, ideal treatment window, potential complications, isolation and precautions for COVID-19.  Discussed possibility of rebound with antivirals and the need to reisolate if it should occur for 5 days. Checked for/reviewed any labs done in the last 90 days with GFR listed in HPI if available. After lengthy discussion, the patient opted for treatment with Legevrio due to concerns regarding the potential drug interactions with paxlovid and other concerns and due to being higher risk for complications of covid or severe disease and other factors. Discussed EUA status of this drug and the fact that there is preliminary limited knowledge of risks/interactions/side effects per EUA document vs possible benefits and precautions. This information was shared with patient during the visit and also was provided in patient instructions. Also, advised that patient discuss risks/interactions and use with pharmacist/treatment team as well.  Other symptomatic care measures summarized in patient instructions. Advised to seek prompt virtual visit or in person care if worsening, new symptoms arise, or if is not improving with treatment as expected per our conversation of expected course. Discussed options for follow up care. Did let this patient know that I do telemedicine on Tuesdays and Thursdays for  and those are the days I am logged into the system. Advised to schedule follow up visit  with PCP, [facility] virtual visits or [facility] if any further questions or concerns to avoid delays in care.   I discussed the assessment and treatment plan with the patient. The patient was provided an opportunity to ask questions and all were answered. The patient agreed with the plan and demonstrated an understanding of the instructions.     Lucretia Kern, DO

## 2021-03-29 NOTE — Patient Instructions (Addendum)
HOME CARE TIPS:    -I sent the medication(s) we discussed to your pharmacy: Meds ordered this encounter  Medications   molnupiravir EUA (LAGEVRIO) 200 mg CAPS capsule    Sig: Take 4 capsules (800 mg total) by mouth 2 (two) times daily for 5 days.    Dispense:  40 capsule    Refill:  0     -I sent in the [place] treatment or referral you requested per our discussion. Please see the information provided below and discuss further with the pharmacist/treatment team.    -there is a chance of rebound illness after finishing your treatment. If you become sick again please isolate for an additional 5 days, plus 5 more days of masking.   -can use tylenol if needed for fevers, aches and pains per instructions  -can use nasal saline a few times per day if you have nasal congestion  -stay hydrated, drink plenty of fluids and eat small healthy meals - avoid dairy  -follow up with your doctor in 2-3 days unless improving and feeling better  -stay home while sick, except to seek medical care. If you have COVID19, you will likely be contagious for 7-10 days. Flu or Influenza is likely contagious for about 7 days. Other respiratory viral infections remain contagious for 5-10+ days depending on the virus and many other factors. Wear a good mask that fits snugly (such as N95 or KN95) if around others to reduce the risk of transmission.  It was nice to meet you today, and I really hope you are feeling better soon. I help  out with telemedicine visits on Tuesdays and Thursdays and am happy to help if you need a follow up virtual visit on those days. Otherwise, if you have any concerns or questions following this visit please schedule a follow up visit with your Primary Care doctor or seek care at a local urgent care clinic to avoid delays in care.    Seek in person care or schedule a follow up video visit promptly if your symptoms worsen, new concerns arise or you are not improving with  treatment. Call 911 and/or seek emergency care if your symptoms are severe or life threatening.    Fact Sheet for Patients And Caregivers Emergency Use Authorization (EUA) Of LAGEVRIO (molnupiravir) capsules For Coronavirus Disease 2019 (COVID-19)  What is the most important information I should know about LAGEVRIO? LAGEVRIO may cause serious side effects, including: ? LAGEVRIO may cause harm to your unborn baby. It is not known if LAGEVRIO will harm your baby if you take LAGEVRIO during pregnancy. o LAGEVRIO is not recommended for use in pregnancy. o LAGEVRIO has not been studied in pregnancy. LAGEVRIO was studied in pregnant animals only. When LAGEVRIO was given to pregnant animals, LAGEVRIO caused harm to their unborn babies. o You and your healthcare provider may decide that you should take LAGEVRIO during pregnancy if there are no other COVID-19 treatment options approved or authorized by the FDA that are accessible or clinically appropriate for you. o If you and your healthcare provider decide that you should take LAGEVRIO during pregnancy, you and your healthcare provider should discuss the known and potential benefits and the potential risks of taking LAGEVRIO during pregnancy. For individuals who are able to become pregnant: ? You should use a reliable method of birth control (contraception) consistently and correctly during treatment with LAGEVRIO and for 4 days after the last dose of LAGEVRIO. Talk to your healthcare provider about reliable birth control methods. ?  Before starting treatment with [facility] your healthcare provider may do a pregnancy test to see if you are pregnant before starting treatment with LAGEVRIO. ? Tell your healthcare provider right away if you become pregnant or think you may be pregnant during treatment with LAGEVRIO. Pregnancy Surveillance Program: ? There is a pregnancy surveillance program for individuals who take LAGEVRIO during pregnancy. The  purpose of this program is to collect information about the health of you and your baby. Talk to your healthcare provider about how to take part in this program. ? If you take LAGEVRIO during pregnancy and you agree to participate in the pregnancy surveillance program and allow your healthcare provider to share your information with [place], then your healthcare provider will report your use of [place] during pregnancy to [place]. by calling [phone] or PeacefulBlog.es. For individuals who are sexually active with partners who are able to become pregnant: ? It is not known if LAGEVRIO can affect sperm. While the risk is regarded as low, animal studies to fully assess the potential for LAGEVRIO to affect the babies of males treated with LAGEVRIO have not been completed. A reliable method of birth control (contraception) should be used consistently and correctly during treatment with LAGEVRIO and for at least 3 months after the last dose. The risk to sperm beyond 3 months is not known. Studies to understand the risk to sperm beyond 3 months are ongoing. Talk to your healthcare provider about reliable birth control methods. Talk to your healthcare provider if you have questions or concerns about how LAGEVRIO may affect sperm. You are being given this fact sheet because your healthcare provider believes it is necessary to provide you with LAGEVRIO for the treatment of adults with mild-to-moderate coronavirus disease 2019 (COVID-19) with positive results of direct SARS-CoV-2 viral testing, and who are at high risk for progression to severe COVID-19 including hospitalization or death, and for whom other COVID-19 treatment options approved or authorized by the FDA are not accessible or clinically appropriate. The U.S. Food and Drug Administration (FDA) has issued an Emergency Use Authorization (EUA) to make LAGEVRIO available during the COVID-19 pandemic  (for more details about an EUA please see What is an Emergency Use Authorization? at the end of this document). LAGEVRIO is not an FDA-approved medicine in the [place]. Read this Fact Sheet for information about LAGEVRIO. Talk to your healthcare provider about your options if you have any questions. It is your choice to take LAGEVRIO.  What is COVID-19? COVID-19 is caused by a virus called a coronavirus. You can get COVID-19 through close contact with another person who has the virus. COVID-19 illnesses have ranged from very mild-to-severe, including illness resulting in death. While information so far suggests that most COVID-19 illness is mild, serious illness can happen and may cause some of your other medical conditions to become worse. Older people and people of all ages with severe, long lasting (chronic) medical conditions like heart disease, lung disease and diabetes, for example seem to be at higher risk of being hospitalized for COVID-19.  What is LAGEVRIO? LAGEVRIO is an investigational medicine used to treat mild-to-moderate COVID-19 in adults: ? with positive results of direct SARS-CoV-2 viral testing, and ? who are at high risk for progression to severe COVID-19 including hospitalization or death, and for whom other COVID-19 treatment options approved or authorized by the FDA are not accessible or clinically appropriate. The FDA has authorized the emergency use of LAGEVRIO for  the treatment of mild-tomoderate COVID-19 in adults under an EUA. For more information on EUA, see the What is an Emergency Use Authorization (EUA)? section at the end of this Fact Sheet. LAGEVRIO is not authorized: ? for use in people less than 75 years of age. ? for prevention of COVID-19. ? for people needing hospitalization for COVID-19. ? for use for longer than 5 consecutive days.  What should I tell my healthcare provider before I take LAGEVRIO? Tell your healthcare provider if  you: ? Have any allergies ? Are breastfeeding or plan to breastfeed ? Have any serious illnesses ? Are taking any medicines (prescription, over-the-counter, vitamins, or herbal products).  How do I take LAGEVRIO? ? Take LAGEVRIO exactly as your healthcare provider tells you to take it. ? Take 4 capsules of LAGEVRIO every 12 hours (for example, at 8 am and at 8 pm) ? Take LAGEVRIO for 5 days. It is important that you complete the full 5 days of treatment with LAGEVRIO. Do not stop taking LAGEVRIO before you complete the full 5 days of treatment, even if you feel better. ? Take LAGEVRIO with or without food. ? You should stay in isolation for as long as your healthcare provider tells you to. Talk to your healthcare provider if you are not sure about how to properly isolate while you have COVID-19. ? Swallow LAGEVRIO capsules whole. Do not open, break, or crush the capsules. If you cannot swallow capsules whole, tell your healthcare provider. ? What to do if you miss a dose: o If it has been less than 10 hours since the missed dose, take it as soon as you remember o If it has been more than 10 hours since the missed dose, skip the missed dose and take your dose at the next scheduled time. ? Do not double the dose of LAGEVRIO to make up for a missed dose.  What are the important possible side effects of LAGEVRIO? ? See, What is the most important information I should know about LAGEVRIO? ? Allergic Reactions. Allergic reactions can happen in people taking LAGEVRIO, even after only 1 dose. Stop taking LAGEVRIO and call your healthcare provider right away if you get any of the following symptoms of an allergic reaction: o hives o rapid heartbeat o trouble swallowing or breathing o swelling of the mouth, lips, or face o throat tightness o hoarseness o skin rash The most common side effects of LAGEVRIO are: ? diarrhea ? nausea ? dizziness These are not all the possible side effects  of LAGEVRIO. Not many people have taken LAGEVRIO. Serious and unexpected side effects may happen. This medicine is still being studied, so it is possible that all of the risks are not known at this time.  What other treatment choices are there?  Veklury (remdesivir) is FDA-approved as an intravenous (IV) infusion for the treatment of mildto-moderate LNLGX-21 in certain adults and children. Talk with your doctor to see if Marijean Heath is appropriate for you. Like LAGEVRIO, FDA may also allow for the emergency use of other medicines to treat people with COVID-19. Go to LacrosseProperties.si for more information. It is your choice to be treated or not to be treated with LAGEVRIO. Should you decide not to take it, it will not change your standard medical care.  What if I am breastfeeding? Breastfeeding is not recommended during treatment with LAGEVRIO and for 4 days after the last dose of LAGEVRIO. If you are breastfeeding or plan to breastfeed, talk to your healthcare  provider about your options and specific situation before taking LAGEVRIO.  How do I report side effects with LAGEVRIO? Contact your healthcare provider if you have any side effects that bother you or do not go away. Report side effects to FDA MedWatch at SmoothHits.hu or call 1-800-FDA-1088 (1- 3607181536).  How should I store [place]? ? Store LAGEVRIO capsules at room temperature between 64F to 37F (20C to 25C). ? Keep LAGEVRIO and all medicines out of the reach of children and pets. How can I learn more about COVID-19? ? Ask your healthcare provider. ? Visit SeekRooms.co.uk ? Contact your local or state public health department. ? Call [place] at [phone] (toll free in the [place] ? Visit [email or web address].com  What Is an Emergency Use Authorization (EUA)? The [place] FDA has made  [place] available under an emergency access mechanism called an Emergency Use Authorization (EUA) The EUA is supported by a Presenter, broadcasting Health and Human Service (HHS) declaration that circumstances exist to justify emergency use of drugs and biological products during the COVID-19 pandemic. LAGEVRIO for the treatment of mild-to-moderate COVID-19 in adults with positive results of direct SARS-CoV-2 viral testing, who are at high risk for progression to severe COVID-19, including hospitalization or death, and for whom alternative COVID-19 treatment options approved or authorized by FDA are not accessible or clinically appropriate, has not undergone the same type of review as an FDA-approved product. In issuing an EUA under the HOYWV-14 public health emergency, the FDA has determined, among other things, that based on the total amount of scientific evidence available including data from adequate and well-controlled clinical trials, if available, it is reasonable to believe that the product may be effective for diagnosing, treating, or preventing COVID-19, or a serious or life-threatening disease or condition caused by COVID-19; that the known and potential benefits of the product, when used to diagnose, treat, or prevent such disease or condition, outweigh the known and potential risks of such product; and that there are no adequate, approved, and available alternatives.  All of these criteria must be met to allow for the product to be used in the treatment of patients during the COVID-19 pandemic. The EUA for LAGEVRIO is in effect for the duration of the COVID-19 declaration justifying emergency use of LAGEVRIO, unless terminated or revoked (after which LAGEVRIO may no longer be used under the EUA). For patent information: http://rogers.info/ Copyright  2021-2022 [place], [place] and its affiliates. All rights reserved. [id number] Revised: March  2022

## 2021-04-03 ENCOUNTER — Ambulatory Visit: Payer: Medicare PPO | Admitting: Nurse Practitioner

## 2021-04-03 ENCOUNTER — Encounter: Payer: Self-pay | Admitting: Nurse Practitioner

## 2021-04-03 VITALS — BP 104/74 | HR 80 | Ht 66.0 in | Wt 190.5 lb

## 2021-04-03 DIAGNOSIS — K5010 Crohn's disease of large intestine without complications: Secondary | ICD-10-CM | POA: Diagnosis not present

## 2021-04-03 NOTE — Progress Notes (Signed)
04/03/2021 Darryl [name] [id number] 03/05/1951   Chief Complaint: Discuss Crohn's treatment   History of Present Illness: Darryl [name] is a 71 year old male with a past medical history of coronary artery disease s/p STEMI 06/2018 s/p DES on Plavix, ischemic cardiomyopathy with LV EF 40 - 45%, LV thrombus (no longer on Warfarin), DM II, TIA vs transient global amnesia 08/2020, eczema, vitamin B12 deficiency, GERD, colon polyps and Crohn's colitis previously on 6 MP which was discontinued secondary to pancytopenia then started on Humira summer 2020. He is followed by Dr. Hilarie Fredrickson.  He presents to our office today to discuss possible alternative Crohn's treatment.  His Crohn's disease is well controlled on Humira.  He was previously prescribed Humira 40 mg subcu every 2 weeks, however, he elected to stretch out his Humira interval to every 3 weeks about 2 months ago.  He has chronic eczema to his legs for which he sees a dermatologist.  He stated his dermatologist believes his eczema is due to his Humira treatment.  However, the patient stated he had eczema for many years prior to initiating Humira.  His Crohn's disease is well controlled on Humira.  He denies having any abdominal pain.  He passes 1 or 2 formed stools daily.  No rectal bleeding or black stools.  No mucus per the rectum.  No NSAID use.  No GERD symptoms.  His most recent colonoscopy was 04/11/2020, see results below.  He reported having COVID infection 1 week ago which has completely resolved.  In fact, he stated he feels better now than he has in months.  Colonoscopy 04/11/2020: - The examined portion of the ileum was normal. - Simple Endoscopic Score for Crohn's Disease: 0, mucosal inflammatory changes secondary to Crohn's disease, in remission. Biopsied. - One 4 mm polyp in the sigmoid colon, removed with a cold snare. Resected and retrieved. - Diverticulosis in the sigmoid colon. - Small internal hemorrhoids. - 2 year recall 1.  Surgical [P], right colon - INACTIVE CHRONIC COLITIS, CONSISTENT WITH PATIENT'S CLINICAL HISTORY OF CROHN'S DISEASE - SMALL INFLAMMATORY PSEUDOPOLYP - NEGATIVE FOR GRANULOMAS OR DYSPLASIA 2. Surgical [P], left colon - INACTIVE CHRONIC COLITIS, CONSISTENT WITH PATIENT'S CLINICAL HISTORY OF CROHN'S DISEASE - NEGATIVE FOR GRANULOMAS OR DYSPLASIA 3. Surgical [P], colon, sigmoid, polyp - INFLAMMATORY PSEUDOPOLYP - NEGATIVE FOR DYSPLASIA  Past Medical History:  Diagnosis Date   Allergic rhinitis, cause unspecified    Anal stenosis    Asthma    Cataract    Chronic orthostatic hypotension    Related to borderline hypotension; intolerant of blood pressure medications.  On Midodrine   Coronary artery disease involving native coronary artery of native heart without angina pectoris 06/2018    p-mRCA 100% (DES PCI - p-mRCA 100% (DES PCI - Resolute Onyx 3.5 x 26 --> ~3.7 mm)), dRCA ~50% & PAV 40%; pRI 85% (Staged DES PCI  Resolute Onyx 2.25 x 15 - ~2.5 mm). 100% LAD CTO after small D1 with ost-prox 60%. R-L collaterals to LAD seen post PCI.   Crohn's colitis ([place])    Exostosis of unspecified site    GERD (gastroesophageal reflux disease)    History of acute inferior wall MI 06/2018   100% p-mRCA -- DES PCI (complicated by mild cardiogenic shock, partially related to 100% LAD CTO) --> staged PCI-RI   Hyperlipidemia with target LDL less than 70 07/04/2015   Now s/p MI with MV CAD - optimal LDL < 50   Hyperplastic colon polyp  02/26/2008   Hypertension    Ischemic cardiomyopathy    Initial echo shows EF of 40 to 45% with anterior hypokinesis. Cardiac MRI shows EF of 49% with anterior anteroapical hypokinesis/akinesis and LV thrombus.   Lateral epicondylitis  of elbow    Left ventricular thrombus following MI [facility]) ==> resolved as of January 2021 10/10/2018   Cardiac MRI 09/28/2018: Mildly dilated LV.  EF~ 49%.  Mid Anteriopr/Anteroseptal & Apical Anterior HK.  LV APICAL THROMBUS (partially layered) 22 x  17 x 11. ==> b) f/u Echo 03/2019 -No LV Thrombus (NO LONGER ON WARFARIN)   Leukopenia    Macrocytic anemia    Related to azathioprine   Myocardial infarction [facility])    Nontraumatic rupture of patellar tendon    Obesity, unspecified    Regional enteritis of unspecified site    Seasonal allergies    Sessile colonic polyp    Vitamin B12 deficiency    Past Surgical History:  Procedure Laterality Date   CARDIAC MRI  09/2018   Mildly dilated LV.  EF estimated roughly 49%.  Hypokinesis of the mid anterior, anteroseptal and apical anterior wall.  There is also apical thrombus (partially layered) 22 x 17 x 11. -->  50-75% late gadolinium enhancement of the mid anterior, anteroseptal and apical anterior and septal walls suggestive of near full-thickness scar w/ minimal viability (poor recovery prognosis w/ REVASCULARIZATION   COLONOSCOPY     2016   CORONARY STENT INTERVENTION N/A 07/24/2018   Procedure: CORONARY STENT INTERVENTION;  Surgeon: Troy Sine, MD;  Location: [facility];  Service: Cardiovascular: Staged PCI pRI 85-90%: Resolute Onyx DES 2.25 x 15 (2.5 mm)   CORONARY/GRAFT ACUTE MI REVASCULARIZATION N/A 07/23/2018   Procedure: CORONARY/GRAFT ACUTE MI REVASCULARIZATION;  Surgeon: [name], MD;  Location: [place] [facility];  Service: Cardiovascular;::  p-mRCA 100% (DES PCI - p-mRCA 100% (DES PCI - Resolute Onyx 3.5 x 26 (post-dilated ~3.7 mm)), dRCA ~50% & PAV 40%; pRI 85% (Staged PCI). 100% LAD CTO after small D1 with ost-prox 60%. R-L collaterals to LAD seen post PCI.;    LEFT HEART CATH N/A 07/24/2018   Procedure: LEFT HEART CATH;  Surgeon: Troy Sine, MD;  Location: [place];  Service: Cardiovascular;  Laterality: N/A; 85-90% pRCA, 60% ost D1 then 100% CTO of LAD -- staged DES PCI LAD   LEFT HEART CATH AND CORONARY ANGIOGRAPHY  07/23/2018   Procedure: LEFT HEART CATH;  Surgeon: [name]., MD;  [facility]   p-mRCA 100% (DES PCI),dRCA  ~50% & PAV 40%; pRI 85% (Staged PCI). 100% LAD CTO after small D1 with ost-prox 60%. R-L collaterals to LAD seen post PCI.;    RIGHT HEART CATH N/A 07/23/2018   Procedure: RIGHT HEART CATH;  Surgeon: [name], MD;  Location: [place] [facility];  Service: Cardiovascular;  Normal RHC #s, CO/CI normal   TONSILLECTOMY AND ADENOIDECTOMY     TRANSTHORACIC ECHOCARDIOGRAM  07/23/2018   Insetting of inferior MI with occluded LAD CTO; --Severe akinesis of the mid anteroseptal wall.  EF 40 and 45%.  GR 1 DD.;;     TRANSTHORACIC ECHOCARDIOGRAM  04/06/2019   To follow-up EF and LV thrombus:  EF 40 and 45%.  Distal septal and apical akinesis.  No LV thrombus noted.  Mild LVH.  GR 1 DD.  Normal RV.  Mild LA dilation.  Normal RA.  Mild aortic valve sclerosis with no stenosis - mild AI   -->  warfarin discontinued.  Continued on Plavix alone.   Current Medications, Allergies, Past Medical History, Past Surgical History, Family History and Social History were reviewed in [employer] record.  Review of Systems:   Constitutional: Negative for fever, sweats, chills or weight loss.  Respiratory: Negative for shortness of breath.   Cardiovascular: Negative for chest pain, palpitations and leg swelling.  Gastrointestinal: See HPI.  Musculoskeletal: Negative for back pain or muscle aches.  Neurological: Negative for dizziness, headaches or paresthesias.   Physical Exam: BP 104/74    Pulse 80    Ht 5' 6"  (1.676 m)    Wt 190 lb 8 oz (86.4 kg)    SpO2 97%    BMI 30.75 kg/m  General: 71 year old male in no acute distress. Head: Normocephalic and atraumatic. Eyes: No scleral icterus. Conjunctiva pink . Ears: Normal auditory acuity. Mouth: Dentition intact. No ulcers or lesions.  Lungs: Clear throughout to auscultation. Heart: Regular rate and rhythm, no murmur. Abdomen: Soft, nontender and nondistended. No masses or hepatomegaly. Normal bowel sounds x 4 quadrants.  Rectal:  Deferred. Musculoskeletal: Symmetrical with no gross deformities. Extremities: No edema.  Eczema plaques to the anterior shins bilaterally.  Neurological: Alert oriented x 4. No focal deficits.  Psychological: Alert and cooperative. Normal mood and affect  Assessment and Recommendations:  54) 71 year old male with Crohn's colitis prescribed Humira Q 2 weeks, patient administering Humira injections every 3 weeks.  Patient presents today to discuss possible alternative Crohn's disease treatment as his dermatologist is concerned Humira is triggering his significant lower extremity eczema.  However, the patient's Crohn's disease is stable on Humira.  The patient elects to pursue a dermatology second opinion prior to changing Humira to another biologic i.e. Entyvio. -Continue Humira 40 mg subcu every 2 weeks as initially prescribed -Patient will contact our office after he completes a 2nd opinion dermatology consult -Next colonoscopy due 03/2022 -Patient is scheduled to have routine laboratory studies with his PCP later this week, I requested a QuantiFERON gold level and CRP to be at that time as the patient did not wish to undergo two lab draws in one week  2) GERD, stable   3) Vitamin B12 deficiency   4) Covid 19 + one week ago, symptoms resolved

## 2021-04-03 NOTE — Patient Instructions (Addendum)
RECOMMENDATIONS: Please have your primary care provider or the following labs per your request: CMET CRP Quantiferon gold  We discussed a second opinion with dermatology regarding eczema.  It was great seeing you today! Thank you for entrusting me with your care and choosing [facility].  Noralyn Pick, CRNP  The [facility] providers would like to encourage you to use [facility] to communicate with providers for non-urgent requests or questions.  Due to long hold times on the telephone, sending your provider a message by [facility] may be faster and more efficient way to get a response. Please allow 48 business hours for a response.  Please remember that this is for non-urgent requests/questions. If you are age 72 or older, your body mass index should be between 23-30. Your Body mass index is 30.75 kg/m. If this is out of the aforementioned range listed, please consider follow up with your Primary Care Provider.  If you are age 27 or older, your body mass index should be between 23-30. Your Body mass index is 30.75 kg/m. If this is out of the aforementioned range listed, please consider follow up with your Primary Care Provider.

## 2021-04-05 ENCOUNTER — Ambulatory Visit: Payer: Medicare PPO | Admitting: Internal Medicine

## 2021-04-05 ENCOUNTER — Encounter: Payer: Self-pay | Admitting: Internal Medicine

## 2021-04-05 ENCOUNTER — Other Ambulatory Visit: Payer: Self-pay

## 2021-04-05 VITALS — BP 120/84 | HR 83 | Temp 97.8°F | Ht 66.0 in | Wt 190.0 lb

## 2021-04-05 DIAGNOSIS — R351 Nocturia: Secondary | ICD-10-CM | POA: Diagnosis not present

## 2021-04-05 DIAGNOSIS — Z0000 Encounter for general adult medical examination without abnormal findings: Secondary | ICD-10-CM | POA: Diagnosis not present

## 2021-04-05 DIAGNOSIS — E785 Hyperlipidemia, unspecified: Secondary | ICD-10-CM

## 2021-04-05 DIAGNOSIS — E1169 Type 2 diabetes mellitus with other specified complication: Secondary | ICD-10-CM

## 2021-04-05 DIAGNOSIS — E118 Type 2 diabetes mellitus with unspecified complications: Secondary | ICD-10-CM | POA: Diagnosis not present

## 2021-04-05 DIAGNOSIS — I10 Essential (primary) hypertension: Secondary | ICD-10-CM | POA: Diagnosis not present

## 2021-04-05 DIAGNOSIS — K50118 Crohn's disease of large intestine with other complication: Secondary | ICD-10-CM

## 2021-04-05 DIAGNOSIS — N1831 Chronic kidney disease, stage 3a: Secondary | ICD-10-CM

## 2021-04-05 DIAGNOSIS — N401 Enlarged prostate with lower urinary tract symptoms: Secondary | ICD-10-CM

## 2021-04-05 DIAGNOSIS — K219 Gastro-esophageal reflux disease without esophagitis: Secondary | ICD-10-CM | POA: Diagnosis not present

## 2021-04-05 DIAGNOSIS — E781 Pure hyperglyceridemia: Secondary | ICD-10-CM

## 2021-04-05 DIAGNOSIS — D510 Vitamin B12 deficiency anemia due to intrinsic factor deficiency: Secondary | ICD-10-CM | POA: Diagnosis not present

## 2021-04-05 DIAGNOSIS — Z111 Encounter for screening for respiratory tuberculosis: Secondary | ICD-10-CM | POA: Insufficient documentation

## 2021-04-05 LAB — HEPATIC FUNCTION PANEL
ALT: 28 U/L (ref 0–53)
AST: 23 U/L (ref 0–37)
Albumin: 4.5 g/dL (ref 3.5–5.2)
Alkaline Phosphatase: 68 U/L (ref 39–117)
Bilirubin, Direct: 0.1 mg/dL (ref 0.0–0.3)
Total Bilirubin: 0.6 mg/dL (ref 0.2–1.2)
Total Protein: 7.1 g/dL (ref 6.0–8.3)

## 2021-04-05 LAB — URINALYSIS, ROUTINE W REFLEX MICROSCOPIC
Bilirubin Urine: NEGATIVE
Hgb urine dipstick: NEGATIVE
Ketones, ur: NEGATIVE
Leukocytes,Ua: NEGATIVE
Nitrite: NEGATIVE
RBC / HPF: NONE SEEN (ref 0–?)
Specific Gravity, Urine: 1.02 (ref 1.000–1.030)
Total Protein, Urine: NEGATIVE
Urine Glucose: NEGATIVE
Urobilinogen, UA: 1 (ref 0.0–1.0)
pH: 6 (ref 5.0–8.0)

## 2021-04-05 LAB — VITAMIN B12: Vitamin B-12: 853 pg/mL (ref 211–911)

## 2021-04-05 LAB — BASIC METABOLIC PANEL
BUN: 19 mg/dL (ref 6–23)
CO2: 28 mEq/L (ref 19–32)
Calcium: 9.8 mg/dL (ref 8.4–10.5)
Chloride: 103 mEq/L (ref 96–112)
Creatinine, Ser: 1.27 mg/dL (ref 0.40–1.50)
GFR: 56.76 mL/min — ABNORMAL LOW (ref >60.00)
Glucose, Bld: 116 mg/dL — ABNORMAL HIGH (ref 70–99)
Potassium: 4.4 mEq/L (ref 3.5–5.1)
Sodium: 138 mEq/L (ref 135–145)

## 2021-04-05 LAB — LIPID PANEL
Cholesterol: 154 mg/dL (ref 0–200)
HDL: 58.2 mg/dL (ref >39.00)
NonHDL: 96.25
Total CHOL/HDL Ratio: 3
Triglycerides: 213 mg/dL — ABNORMAL HIGH (ref 0.0–149.0)
VLDL: 42.6 mg/dL — ABNORMAL HIGH (ref 0.0–40.0)

## 2021-04-05 LAB — MICROALBUMIN / CREATININE URINE RATIO
Creatinine,U: 151.7 mg/dL
Microalb Creat Ratio: 0.9 mg/g (ref 0.0–30.0)
Microalb, Ur: 1.4 mg/dL (ref 0.0–1.9)

## 2021-04-05 LAB — C-REACTIVE PROTEIN: CRP: <1 mg/dL (ref 0.5–20.0)

## 2021-04-05 LAB — TSH: TSH: 1.16 u[IU]/mL (ref 0.35–5.50)

## 2021-04-05 LAB — HEMOGLOBIN A1C: Hgb A1c MFr Bld: 6.5 % (ref 4.6–6.5)

## 2021-04-05 LAB — LDL CHOLESTEROL, DIRECT: Direct LDL: 72 mg/dL

## 2021-04-05 LAB — PSA: PSA: 0.18 ng/mL (ref 0.10–4.00)

## 2021-04-05 MED ORDER — DAPAGLIFLOZIN PROPANEDIOL 10 MG PO TABS: 10.0000 mg | ORAL_TABLET | Freq: Every day | ORAL | 1 refills | Status: DC

## 2021-04-05 MED ORDER — PANTOPRAZOLE SODIUM 40 MG PO TBEC: 40.0000 mg | DELAYED_RELEASE_TABLET | Freq: Every day | ORAL | 1 refills | Status: DC

## 2021-04-05 NOTE — Progress Notes (Addendum)
Subjective:  Patient ID: Darryl [name], male    DOB: 1950/06/16  Age: 71 y.o. MRN: [id number]  CC: Annual Exam, Diabetes, Hyperlipidemia, and Anemia  This visit occurred during the SARS-CoV-2 public health emergency.  Safety protocols were in place, including screening questions prior to the visit, additional usage of staff PPE, and extensive cleaning of exam room while observing appropriate contact time as indicated for disinfecting solutions.    HPI BAIRON KLEMANN presents for a CPX and f/up -   He developed a COVID infection about a week ago.  He denies cough, fever, chills, chest pain, dizziness, lightheadedness, palpitations, shortness of breath, or edema.  Outpatient Medications Prior to Visit  Medication Sig Dispense Refill   Adalimumab (HUMIRA PEN) 40 MG/0.4ML PNKT Inject into the skin every 21 ( twenty-one) days.     ADVAIR HFA 115-21 MCG/ACT inhaler USE 2 PUFFS TWICE DAILY AS NEEDED. SHAKE WELL 12 g 1   cholecalciferol (VITAMIN D3) 25 MCG (1000 UT) tablet Take 1,000 Units by mouth daily.     clopidogrel (PLAVIX) 75 MG tablet TAKE 1 TABLET ONCE DAILY. 90 tablet 1   Coenzyme Q10 (CO Q 10 PO) Take 100 mg by mouth.     folic acid (FOLVITE) 1 MG tablet Take 1 mg by mouth daily.     hydrocortisone 2.5 % cream Apply 1 application topically daily.      irbesartan (AVAPRO) 75 MG tablet TAKE 1 TABLET BY MOUTH DAILY. (Patient taking differently: 37.5 mg.) 90 tablet 1   loperamide (IMODIUM A-D) 2 MG tablet Take 2 mg by mouth 4 (four) times daily as needed for diarrhea or loose stools.     metoprolol succinate (TOPROL-XL) 25 MG 24 hr tablet TAKE 1 TABLET ONCE DAILY. 90 tablet 3   midodrine (PROAMATINE) 2.5 MG tablet TAKE 1 TAB IN AM. MAY TAKE AN ADDITIONAL TAB AFTER 6HR IF STILL DIZZY/SYSTOLIC GM<01 40 tablet 1   montelukast (SINGULAIR) 10 MG tablet TAKE ONE TABLET AT BEDTIME. 90 tablet 1   Multiple Vitamins-Minerals (CENTRUM SILVER PO) Take 1 tablet by mouth daily.     nitroGLYCERIN  (NITROSTAT) 0.4 MG SL tablet 1 TAB UNDER TONGUE AS NEEDED FOR CHEST PAIN. MAY REPEAT EVERY 5 MIN FOR A TOTAL OF 3 DOSES. 25 tablet 3   rosuvastatin (CRESTOR) 40 MG tablet TAKE 1 TABLET ONCE DAILY. 90 tablet 3   traMADol (ULTRAM) 50 MG tablet TAKE ONE TABLET EVERY 6 HOURS AS NEEDED. 30 tablet 0   VASCEPA 1 g capsule TAKE (2) CAPSULES BY MOUTH TWICE DAILY. 360 capsule 1   VENTOLIN HFA 108 (90 Base) MCG/ACT inhaler USE 2 PUFFS EVERY 6 HOURS AS NEEDED FOR WHEEZING. 18 g 3   vitamin B-12 (CYANOCOBALAMIN) 1000 MCG tablet Take 1 tablet (1,000 mcg total) by mouth daily.     zolpidem (AMBIEN) 10 MG tablet Take 1 tablet (10 mg total) by mouth at bedtime as needed. 30 tablet 0   pantoprazole (PROTONIX) 40 MG tablet TAKE ONE TABLET ONCE DAILY 30 tablet 0   No facility-administered medications prior to visit.    ROS Review of Systems  Constitutional:  Negative for chills, diaphoresis, fatigue and fever.  HENT: Negative.  Negative for sore throat and trouble swallowing.   Eyes: Negative.   Respiratory:  Negative for cough, chest tightness, shortness of breath and wheezing.   Cardiovascular:  Negative for chest pain, palpitations and leg swelling.  Gastrointestinal:  Negative for abdominal pain, blood in stool, constipation, diarrhea and vomiting.  Endocrine: Negative.   Genitourinary: Negative.  Negative for difficulty urinating.  Musculoskeletal: Negative.   Skin:  Positive for rash.  Neurological:  Negative for dizziness, weakness, light-headedness, numbness and headaches.  Hematological:  Negative for adenopathy. Does not bruise/bleed easily.  Psychiatric/Behavioral: Negative.     Objective:  BP 120/84 (BP Location: Right Arm, Patient Position: Sitting, Cuff Size: Large)    Pulse 83    Temp 97.8 F (36.6 C) (Oral)    Ht 5' 6"  (1.676 m)    Wt 190 lb (86.2 kg)    SpO2 94%    BMI 30.67 kg/m   BP Readings from Last 3 Encounters:  04/05/21 120/84  04/03/21 104/74  09/14/20 102/62    Wt  Readings from Last 3 Encounters:  04/05/21 190 lb (86.2 kg)  04/03/21 190 lb 8 oz (86.4 kg)  09/14/20 182 lb (82.6 kg)    Physical Exam Vitals reviewed.  Constitutional:      Appearance: Normal appearance. He is not ill-appearing.  HENT:     Mouth/Throat:     Mouth: Mucous membranes are moist.  Eyes:     General: No scleral icterus.    Conjunctiva/sclera: Conjunctivae normal.  Cardiovascular:     Rate and Rhythm: Normal rate and regular rhythm.     Heart sounds: No murmur heard. Pulmonary:     Effort: Pulmonary effort is normal.     Breath sounds: No stridor. No wheezing, rhonchi or rales.  Abdominal:     General: Abdomen is flat.     Palpations: There is no mass.     Tenderness: There is no abdominal tenderness. There is no guarding.     Hernia: No hernia is present. There is no hernia in the left inguinal area or right inguinal area.  Genitourinary:    Pubic Area: No rash.      Penis: Normal and circumcised.      Testes: Normal.     Epididymis:     Right: Normal.     Left: Normal.     Prostate: Enlarged. Not tender and no nodules present.     Rectum: Normal. Guaiac result negative. No mass, tenderness, anal fissure, external hemorrhoid or internal hemorrhoid. Normal anal tone.  Musculoskeletal:        General: Normal range of motion.     Cervical back: Neck supple.     Right lower leg: No edema.     Left lower leg: No edema.  Lymphadenopathy:     Cervical: No cervical adenopathy.     Lower Body: No right inguinal adenopathy. No left inguinal adenopathy.  Skin:    General: Skin is warm and dry.     Findings: Rash present. Rash is crusting and macular.  Neurological:     General: No focal deficit present.     Mental Status: He is alert. Mental status is at baseline.  Psychiatric:        Mood and Affect: Mood normal.        Behavior: Behavior normal.    Lab Results  Component Value Date   WBC 7.0 09/06/2020   HGB 15.4 09/06/2020   HCT 44.5 09/06/2020   PLT  266.0 09/06/2020   GLUCOSE 116 (H) 04/05/2021   CHOL 154 04/05/2021   TRIG 213.0 (H) 04/05/2021   HDL 58.20 04/05/2021   LDLDIRECT 72.0 04/05/2021   LDLCALC 63 11/08/2019   ALT 28 04/05/2021   AST 23 04/05/2021   NA 138 04/05/2021   K 4.4 04/05/2021  CL 103 04/05/2021   CREATININE 1.27 04/05/2021   BUN 19 04/05/2021   CO2 28 04/05/2021   TSH 1.16 04/05/2021   PSA 0.18 04/05/2021   INR 2.8 04/02/2019   HGBA1C 6.5 04/05/2021   MICROALBUR 1.4 04/05/2021    MR Brain Wo Contrast  Result Date: 09/09/2020 CLINICAL DATA:  71 year old male status post TIA symptoms on 09/05/2020. Some residual confusion. EXAM: MRI HEAD WITHOUT CONTRAST TECHNIQUE: Multiplanar, multiecho pulse sequences of the brain and surrounding structures were obtained without intravenous contrast. COMPARISON:  Paranasal sinus CT 04/10/2011. FINDINGS: Brain: No restricted diffusion to suggest acute infarction. No midline shift, mass effect, evidence of mass lesion, ventriculomegaly, extra-axial collection or acute intracranial hemorrhage. Cervicomedullary junction and pituitary are within normal limits. Cerebral volume loss appears somewhat disproportionate in the biparietal regions (series 8, image 24). But background gray and white matter signal appear normal for age throughout the brain. No cortical encephalomalacia or chronic cerebral blood products are identified. Deep gray matter nuclei, brainstem and cerebellum appear within normal limits. Vascular: Major intracranial vascular flow voids are preserved. Skull and upper cervical spine: Negative for age visible cervical spine. Visualized bone marrow signal is within normal limits. Sinuses/Orbits: Postoperative changes to both globes. Otherwise negative orbits. Paranasal Visualized paranasal sinuses and mastoids are stable and well aerated. Other: Grossly normal visible internal auditory structures. There is a small circumscribed and perhaps mildly septated T2 hyperintense nodule  in the left superior parotid gland with mildly increased diffusion. 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 Other visible scalp and face soft tissues appear negative. IMPRESSION: 1. No acute intracranial abnormality. Normal for age noncontrast MRI appearance of the brain aside from possible disproportionate volume loss in the biparietal regions. Query signs or symptoms of dementia. 2. 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 Electronically Signed   By: Genevie Ann M.D.   On: 09/09/2020 11:02    Assessment & Plan:   Minas was seen today for annual exam, diabetes, hyperlipidemia and anemia.  Diagnoses and all orders for this visit:  Gastroesophageal reflux disease without esophagitis- He is doing well on the PPI. -     pantoprazole (PROTONIX) 40 MG tablet; Take 1 tablet (40 mg total) by mouth daily.  Hyperlipidemia associated with type 2 diabetes mellitus ([place])- LDL goal achieved. Doing well on the statin  -     Lipid panel; Future -     Hepatic function panel; Future -     TSH; Future -     TSH -     Hepatic function panel -     Lipid panel  Type II diabetes mellitus with manifestations ([place])- Will start an SGLT-2 inh. -     Basic metabolic panel; Future -     Hemoglobin A1c; Future -     Microalbumin / creatinine urine ratio; Future -     Microalbumin / creatinine urine ratio -     Hemoglobin A1c -     Basic metabolic panel -     dapagliflozin propanediol (FARXIGA) 10 MG TABS tablet; Take 1 tablet (10 mg total) by mouth daily before breakfast. -     HM Diabetes Foot Exam  Vitamin B12 deficiency anemia due to intrinsic factor deficiency- His B12 is normal now. -     Vitamin B12; Future -     Vitamin B12  Hypertriglyceridemia- He will improve his lifestyle modifications. -  Lipid panel; Future -     Lipid panel  Routine general medical examination at a health care facility- Exam  completed, labs reviewed, vaccines reviewed, cancer screenings are up-to-date, patient education was given.  BPH associated with nocturia- His PSA is reassuring. -     PSA; Future -     PSA  Essential hypertension- His blood pressure is adequately well controlled. -     Basic metabolic panel; Future -     Urinalysis, Routine w reflex microscopic; Future -     Urinalysis, Routine w reflex microscopic -     Basic metabolic panel  Crohn's disease of large intestine with other complication ([place])- No complications noted. -     C-reactive protein; Future -     C-reactive protein  Screening-pulmonary TB -     QuantiFERON-TB Gold Plus; Future -     QuantiFERON-TB Gold Plus  Stage 3a chronic kidney disease ([place])- Will start empagliflozin for renal protection. -     dapagliflozin propanediol (FARXIGA) 10 MG TABS tablet; Take 1 tablet (10 mg total) by mouth daily before breakfast.  Other orders -     LDL cholesterol, direct   I have changed Karl Pock "Decklyn Bressman"'s pantoprazole. I am also having him start on dapagliflozin propanediol. Additionally, I am having him maintain his vitamin B-12, hydrocortisone, Multiple Vitamins-Minerals (CENTRUM SILVER PO), loperamide, cholecalciferol, midodrine, zolpidem, Coenzyme Q10 (CO Q 10 PO), traMADol, metoprolol succinate, Ventolin HFA, rosuvastatin, nitroGLYCERIN, folic acid, irbesartan, montelukast, clopidogrel, Advair HFA, Vascepa, and Humira Pen.  Meds ordered this encounter  Medications   pantoprazole (PROTONIX) 40 MG tablet    Sig: Take 1 tablet (40 mg total) by mouth daily.    Dispense:  90 tablet    Refill:  1   dapagliflozin propanediol (FARXIGA) 10 MG TABS tablet    Sig: Take 1 tablet (10 mg total) by mouth daily before breakfast.    Dispense:  90 tablet    Refill:  1     Follow-up: Return in about 6 months (around 10/03/2021).  Scarlette Calico, MD

## 2021-04-05 NOTE — Patient Instructions (Signed)

## 2021-04-06 ENCOUNTER — Encounter: Payer: Self-pay | Admitting: Internal Medicine

## 2021-04-08 ENCOUNTER — Other Ambulatory Visit: Payer: Self-pay | Admitting: Cardiology

## 2021-04-10 ENCOUNTER — Telehealth: Payer: Self-pay | Admitting: Cardiology

## 2021-04-10 LAB — QUANTIFERON-TB GOLD PLUS
Mitogen-NIL: >10 IU/mL
NIL: 0.04 IU/mL
QuantiFERON-TB Gold Plus: NEGATIVE
TB1-NIL: 0 IU/mL
TB2-NIL: 0.02 IU/mL

## 2021-04-10 NOTE — Telephone Encounter (Signed)
Pt state he was just recently started on farxiga 10 mg by pcp and wanted  Dr. Allison Quarry input.  Will forward to MD

## 2021-04-10 NOTE — Telephone Encounter (Signed)
This is an appropriate medication given his reduced ejection fraction.  Will help keep some fluid off, but is not truly a diuretic.    This class of medication has an indication for cardiomyopathy/chronic HFrEF.  Agree with giving a trial run.  Glenetta Hew

## 2021-04-10 NOTE — Telephone Encounter (Signed)
Patient called stating his PCP recently prescribed him Farxiga 67m.  He wants to know what Dr HEllyn Hackthinks about this.

## 2021-04-11 NOTE — Telephone Encounter (Signed)
Returned call to patient, made patient aware of Dr. Allison Quarry recommendations and patient verbalized understanding.

## 2021-04-11 NOTE — Telephone Encounter (Signed)
° °  Pt is calling back to f/u

## 2021-04-12 NOTE — Progress Notes (Signed)
Addendum: Reviewed and agree with assessment and management plan. Humira has been working well for his Crohn's however if it is exacerbating his eczema then I would consider changing him to [place].  He should ask his dermatologist if this substitution would have a lower likelihood of eczema exacerbation. Once he finds out this information he can let us know. I would continue Humira until a new biologic can be initiated Chee Dimon, Lajuan Lines, MD

## 2021-04-16 NOTE — Progress Notes (Signed)
I called the patient and I left a detailed message for him regarding Dr. Vena Rua recommendations to ask his dermatologist if Stelara would have a lower risk for triggering exzema?   Patient to contact our office with an update as he feels is appropriate and if wishes to switch biologic therapy.

## 2021-04-23 ENCOUNTER — Telehealth: Payer: Self-pay | Admitting: Internal Medicine

## 2021-04-23 ENCOUNTER — Other Ambulatory Visit: Payer: Self-pay | Admitting: Internal Medicine

## 2021-04-23 DIAGNOSIS — L309 Dermatitis, unspecified: Secondary | ICD-10-CM | POA: Diagnosis not present

## 2021-04-23 NOTE — Telephone Encounter (Signed)
See note below and advise. 

## 2021-04-23 NOTE — Telephone Encounter (Signed)
Patient called and stated that he went to the dermatologist and he believes that his Humira pen is causing him to have bad flare ups of Eczema. Patient is seeking advice if there is anyway that Dr. Hilarie Fredrickson will allow him to switch to Stelara instead of Humira. Please advise.

## 2021-04-23 NOTE — Telephone Encounter (Signed)
Yes, this is certainly reasonable I will place orders for Stelara to start through the infusion center, only 1 doses infusion subsequent doses or subcu He can stop Humira at this time Office follow-up with me in late March Thanks JMP

## 2021-04-24 ENCOUNTER — Encounter: Payer: Self-pay | Admitting: Internal Medicine

## 2021-04-24 ENCOUNTER — Telehealth: Payer: Self-pay | Admitting: Pharmacy Technician

## 2021-04-24 NOTE — Telephone Encounter (Addendum)
Auth Submission: PENDING- STELARA  520 MG IV (medical benefit) Payer: [employer] MEDICARE Medication & CPT/J Code(s) submitted: Stelara Infusion (Ustekinumab) J3358 Route of submission (phone, fax, portal): fax:[phone] Auth type: Buy/Bill Units/visits requested: 520 MG CASE ID# [id number]  Auth Submission: PENDING STELARA 90 MG SQ (PHARMACY BENEFIT) Payer: [employer] MEDICARE Medication & CPT/J Code(s) submitted: Stelara SQ injection (Ustekinumab) J3357 Route of submission (phone, fax, portal): FAX [phone] Auth type: Pharmacy Benefit Units/visits requested: 90 MG Q8WK Reference number: [id number]   Will update once we receive a response.

## 2021-04-24 NOTE — Telephone Encounter (Signed)
Spoke with pt and he is aware of Dr. Vena Rua recommendations. He knows he should get a call from [facility] regarding appt for Stelara infusion. He knows to follow-up in late March.

## 2021-04-25 NOTE — Telephone Encounter (Signed)
Dr. Hilarie Fredrickson,  Juluis Rainier note: Auth Submission: Leandra Kern IV (medical benefit) Payer: [employer] Rossetti medicare Medication & CPT/J Code(s) submitted: Stelara Infusion (Ustekinumab) [id number] Route of submission (phone, fax, portal): fax [phone] Phone: [phone] Auth type: Buy/Bill Units/visits requested: 536m iv Reference number: [id number] from: 04/24/21 to 03/24/22  We will schedule the patient as soon as possible.   Auth Submission: STELARA 90 MG SQ (PHARMACY BENEFIT) Payer: [employer] MEDICARE Medication & CPT/J Code(s) submitted: Stelara SQ injection (Ustekinumab) J3357 Route of submission (phone, fax, portal): FAX [phone]: [phone] type: Pharmacy Benefit Units/visits requested: 90 MG Q8WK Reference number: EGWZ- [id number] from: 04/24/21 to 03/24/22  Script can be sent to any participating pharmacy.

## 2021-04-25 NOTE — Telephone Encounter (Signed)
Yes, that is correct.  We can reach out MD office and have the script sent [facility].

## 2021-04-26 ENCOUNTER — Other Ambulatory Visit (HOSPITAL_COMMUNITY): Payer: Self-pay

## 2021-04-26 ENCOUNTER — Encounter: Payer: Self-pay | Admitting: Internal Medicine

## 2021-04-26 NOTE — Telephone Encounter (Signed)
Stelar f/u: Spoke with patient in regards to [place], patient understands [facility] will supply medication and that he has a $200 co-pay for 56 day supply. Co-pay is affordable for patient at this time.

## 2021-04-26 NOTE — Telephone Encounter (Signed)
Ran test claim for 108m syringe for 56 days supply- patient's copay is $200.00. Patient can fill through [facility]

## 2021-05-03 ENCOUNTER — Ambulatory Visit (INDEPENDENT_AMBULATORY_CARE_PROVIDER_SITE_OTHER): Payer: Medicare PPO

## 2021-05-03 ENCOUNTER — Other Ambulatory Visit: Payer: Self-pay

## 2021-05-03 VITALS — BP 105/68 | HR 69 | Temp 97.5°F | Resp 16 | Ht 66.0 in | Wt 186.2 lb

## 2021-05-03 DIAGNOSIS — K50118 Crohn's disease of large intestine with other complication: Secondary | ICD-10-CM

## 2021-05-03 MED ORDER — ALBUTEROL SULFATE HFA 108 (90 BASE) MCG/ACT IN AERS: 2.0000 | INHALATION_SPRAY | Freq: Once | RESPIRATORY_TRACT | Status: DC | PRN

## 2021-05-03 MED ORDER — DIPHENHYDRAMINE HCL 50 MG/ML IJ SOLN: 50.0000 mg | Freq: Once | INTRAMUSCULAR | Status: DC | PRN

## 2021-05-03 MED ORDER — EPINEPHRINE 0.3 MG/0.3ML IJ SOAJ: 0.3000 mg | Freq: Once | INTRAMUSCULAR | Status: DC | PRN

## 2021-05-03 MED ORDER — USTEKINUMAB 130 MG/26ML IV SOLN
520.0000 mg | Freq: Once | INTRAVENOUS | Status: AC
  Administered 2021-05-03: 520 mg via INTRAVENOUS
  Filled 2021-05-03: qty 104

## 2021-05-03 MED ORDER — FAMOTIDINE IN NACL 20-0.9 MG/50ML-% IV SOLN: 20.0000 mg | Freq: Once | INTRAVENOUS | Status: DC | PRN

## 2021-05-03 MED ORDER — METHYLPREDNISOLONE SODIUM SUCC 125 MG IJ SOLR: 125.0000 mg | Freq: Once | INTRAMUSCULAR | Status: DC | PRN

## 2021-05-03 MED ORDER — SODIUM CHLORIDE 0.9 % IV SOLN: Freq: Once | INTRAVENOUS | Status: DC | PRN

## 2021-05-03 NOTE — Progress Notes (Signed)
Diagnosis: Crohn's Disease  Provider:  Marshell Garfinkel, MD  Procedure: Infusion  IV Type: Peripheral, IV Location: L Antecubital  Ustekinumab (Stelara) Infusion, Dose: 566m  Infusion Start Time: 19562 Infusion Stop Time: 11308 Post Infusion IV Care: Observation period completed and Peripheral IV Discontinued  Discharge: Condition: Good, Destination: Home . AVS provided to patient.   Performed by:  CKoren Shiver RN

## 2021-05-03 NOTE — Patient Instructions (Signed)
Ustekinumab injection What is this medication? USTEKINUMAB ([place] te KIN ue mab) is used to treat plaque psoriasis and psoriatic arthritis. It is also used to treat Crohn's disease and ulcerative colitis. It is not a cure. This medicine may be used for other purposes; ask your health care provider or pharmacist if you have questions. COMMON BRAND NAME(S): Stelara What should I tell my care team before I take this medication? They need to know if you have any of these conditions: cancer diabetes history of skin cancer immune system problems infection (especially a virus infection such as chickenpox, cold sores, or herpes) or history of infections new or changing lesions on your skin receiving or have received allergy shots receive or have received phototherapy for the skin recently received or scheduled to receive a vaccine tuberculosis, a positive skin test for tuberculosis, or have recently been in close contact with someone who has tuberculosis an unusual reaction to ustekinumab, latex, other medicines, foods, dyes, or preservatives pregnant or trying to get pregnant breast-feeding How should I use this medication? This medicine is for injection under the skin or infusion into a vein. It is usually given by a health care professional in a hospital or clinic setting. If you get this medicine at home, you will be taught how to prepare and give this medicine. Use exactly as directed. Take your medicine at regular intervals. Do not take your medicine more often than directed. It is important that you put your used needles and syringes in a special sharps container. Do not put them in a trash can. If you do not have a sharps container, call your pharmacist or healthcare provider to get one. A special MedGuide will be given to you by the pharmacist with each prescription and refill. Be sure to read this information carefully each time. Talk to your pediatrician regarding the use of this medicine in  children. While this drug may be prescribed for children as young as 6 years for selected conditions, precautions do apply. Overdosage: If you think you have taken too much of this medicine contact a poison control center or emergency room at once. NOTE: This medicine is only for you. Do not share this medicine with others. What if I miss a dose? If you miss a dose, take it as soon as you can. If it is almost time for your next dose, take only that dose. Do not take double or extra doses. What may interact with this medication? Do not take this medicine with any of the following medications: live virus vaccines This medicine may also interact with the following medications: cyclosporine biologic medicines such as abatacept, adalimumab, anakinra, certolizumab, etanercept, golimumab, infliximab, rituximab, secukinumab, tocilizumab warfarin This list may not describe all possible interactions. Give your health care provider a list of all the medicines, herbs, non-prescription drugs, or dietary supplements you use. Also tell them if you smoke, drink alcohol, or use illegal drugs. Some items may interact with your medicine. What should I watch for while using this medication? Your condition will be monitored carefully while you are receiving this medicine. Tell your doctor or healthcare professional if your symptoms do not start to get better or if they get worse. You will be tested for tuberculosis (TB) before you start this medicine. If your doctor prescribes any medicine for TB, you should start taking the TB medicine before starting this medicine. Make sure to finish the full course of TB medicine. Call your doctor or health care professional if you  get a cold or other infection while receiving this medicine. Do not treat yourself. This medicine may decrease your body's ability to fight infection. Talk to your doctor about your risk of cancer. You may be more at risk for certain types of cancers if  you take this medicine. What side effects may I notice from receiving this medication? Side effects that you should report to your doctor or health care professional as soon as possible: allergic reactions like skin rash, itching or hives, swelling of the face, lips, or tongue breathing problems changes in vision confusion headache persistent headache seizures signs and symptoms of infection like fever or chills; cough; sore throat; pain or trouble passing urine swollen lymph nodes in the neck, underarm, or groin areas unexplained weight loss unusually weak or tired Side effects that usually do not require medical attention (report to your doctor or health care professional if they continue or are bothersome): diarrhea nausea, vomiting redness, itching, swelling, or bruising at site where injected stomach pain tiredness This list may not describe all possible side effects. Call your doctor for medical advice about side effects. You may report side effects to FDA at 1-800-FDA-1088. Where should I keep my medication? Keep out of the reach of children. Store the prefilled syringes or vials in a refrigerator between 2 to 8 degrees C (36 to 46 degrees F). Keep in the original carton. Protect from light. Do not freeze. Do not shake. The vials should be stored upright. Throw away any unused medicine that has been stored in the refrigerator after the expiration date. If needed, a prefilled syringe may be stored at room temperature up to 30 degrees C (86 degrees F) for a maximum of 30 days. Keep it in the original carton to protect from light. Record the date when it is removed from the refrigerator on the carton. Once a syringe has been stored at room temperature, do not put it back in the refrigerator. Throw the syringe away after 30 days at room temperature, even if it still has medicine in it. NOTE: This sheet is a summary. It may not cover all possible information. If you have questions about  this medicine, talk to your doctor, pharmacist, or health care provider.  2022 Elsevier/Gold Standard (2020-11-28 00:00:00)

## 2021-05-06 ENCOUNTER — Other Ambulatory Visit: Payer: Self-pay | Admitting: Internal Medicine

## 2021-05-06 DIAGNOSIS — I2510 Atherosclerotic heart disease of native coronary artery without angina pectoris: Secondary | ICD-10-CM

## 2021-05-06 DIAGNOSIS — E781 Pure hyperglyceridemia: Secondary | ICD-10-CM

## 2021-05-06 DIAGNOSIS — J4520 Mild intermittent asthma, uncomplicated: Secondary | ICD-10-CM

## 2021-05-16 ENCOUNTER — Other Ambulatory Visit: Payer: Self-pay | Admitting: Pharmacy Technician

## 2021-05-27 ENCOUNTER — Other Ambulatory Visit: Payer: Self-pay | Admitting: Internal Medicine

## 2021-05-30 DIAGNOSIS — L309 Dermatitis, unspecified: Secondary | ICD-10-CM | POA: Diagnosis not present

## 2021-06-13 ENCOUNTER — Other Ambulatory Visit (HOSPITAL_COMMUNITY): Payer: Self-pay

## 2021-06-13 MED ORDER — STELARA 90 MG/ML ~~LOC~~ SOSY
PREFILLED_SYRINGE | SUBCUTANEOUS | 11 refills | Status: DC
  Filled 2021-06-13: qty 1, 56d supply, fill #0
  Filled 2021-06-13: qty 1, fill #0
  Filled 2021-08-09: qty 1, 56d supply, fill #1
  Filled 2021-10-04: qty 1, 56d supply, fill #2
  Filled 2021-11-23: qty 1, 56d supply, fill #3
  Filled 2022-01-30: qty 1, 56d supply, fill #4
  Filled 2022-03-21: qty 1, 56d supply, fill #5
  Filled 2022-05-16: qty 1, 56d supply, fill #6

## 2021-06-13 NOTE — Telephone Encounter (Signed)
Inbound call from patient stating that his Stelara infusion is working great and he is wanting to know how to set up the injection. Please advise.  ?

## 2021-06-13 NOTE — Telephone Encounter (Signed)
Spoke with pt and he is aware and knows to contact [facility] for the [place]. ?

## 2021-06-13 NOTE — Addendum Note (Signed)
Addended by: Rosanne Sack R on: 06/13/2021 12:32 PM ? ? Modules accepted: Orders ? ?

## 2021-06-13 NOTE — Telephone Encounter (Signed)
Left message for pt to call back. ? ?Wynetta Fines, CPhT ?to Beatriz Chancellor, CPhT  Tresa Res, [facility]  Wynetta Fines, CPhT   ?  10:29 AM ?Note ?  ?Stelar f/u: ?Spoke with patient in regards to [place], patient understands [facility] will supply medication and that he has a $200 co-pay for 56 day supply. ?Co-pay is affordable for patient at this time.  ?  ? ?

## 2021-06-19 ENCOUNTER — Encounter: Payer: Self-pay | Admitting: Cardiology

## 2021-06-19 ENCOUNTER — Ambulatory Visit: Payer: Medicare PPO | Admitting: Cardiology

## 2021-06-19 ENCOUNTER — Other Ambulatory Visit (HOSPITAL_COMMUNITY): Payer: Self-pay

## 2021-06-19 ENCOUNTER — Other Ambulatory Visit: Payer: Self-pay

## 2021-06-19 DIAGNOSIS — E1169 Type 2 diabetes mellitus with other specified complication: Secondary | ICD-10-CM | POA: Diagnosis not present

## 2021-06-19 DIAGNOSIS — I2510 Atherosclerotic heart disease of native coronary artery without angina pectoris: Secondary | ICD-10-CM

## 2021-06-19 DIAGNOSIS — E785 Hyperlipidemia, unspecified: Secondary | ICD-10-CM

## 2021-06-19 DIAGNOSIS — G459 Transient cerebral ischemic attack, unspecified: Secondary | ICD-10-CM

## 2021-06-19 DIAGNOSIS — N1831 Chronic kidney disease, stage 3a: Secondary | ICD-10-CM

## 2021-06-19 DIAGNOSIS — E781 Pure hyperglyceridemia: Secondary | ICD-10-CM | POA: Diagnosis not present

## 2021-06-19 DIAGNOSIS — I255 Ischemic cardiomyopathy: Secondary | ICD-10-CM

## 2021-06-19 DIAGNOSIS — I10 Essential (primary) hypertension: Secondary | ICD-10-CM

## 2021-06-19 NOTE — Progress Notes (Signed)
? ? ?Primary Care Provider: Janith Lima, MD ?Cardiologist: Glenetta Hew, MD ?Electrophysiologist: None ? ?Clinic Note: ?Chief Complaint  ?Patient presents with  ? Follow-up  ?  9 months.  (2 years for in person)  ? Coronary Artery Disease  ?  No further angina  ? ?=================================== ? ?ASSESSMENT/PLAN  ? ?Problem List Items Addressed This Visit   ? ?  ? Cardiology Problems  ? Coronary artery disease involving native coronary artery of native heart without angina pectoris (Chronic)  ?  Three-vessel disease with PCI to the RCA and RI with known occlusion of the LAD.  He is pretty active and denies any anginal symptoms of chest pain or pressure.  He is essentially 3 years out ? ?On provide stable regimen. ?Remains on Brilinta, but is 90 mg twice daily.  We will change prescription to maintenance dose of 60 mg twice daily. ?Okay to hold Brilinta 5 days preop for surgical procedures. ?He is on beta-blocker and ARB ?On statin, co-Q10 and Vascepa ?On Farxiga, tolerating well.-Noted benefit. ? ?  ?  ? Relevant Medications  ? irbesartan (AVAPRO) 75 MG tablet  ? Hyperlipidemia associated with type 2 diabetes mellitus (HCC) (Chronic)  ?  On combination of 40 mg Crestor and Vascepa as well as co-Q10. ? ?LDL direct measured at 72 on last check back in January. ?Hopefully with the addition of Vascepa and SGLT2 inhibitor for elevated triglycerides. ? ?Last A1c was 6.5 in January.  Pretty well controlled.  Should improve with Jardiance as well as lifestyle changes. ? ?  ?  ? Relevant Medications  ? irbesartan (AVAPRO) 75 MG tablet  ? TIA (transient ischemic attack)  ?  I think his episode sees consistent with transient global amnesia more so than TIA.  No further episodes since last year. ? ?  ?  ? Relevant Medications  ? irbesartan (AVAPRO) 75 MG tablet  ? Essential hypertension (Chronic)  ?  Blood pressure pretty well controlled on current dose of irbesartan and Toprol.  Would not change because he has had  issues of orthostatic hypotension.  ? ?  ?  ? Relevant Medications  ? irbesartan (AVAPRO) 75 MG tablet  ? Ischemic cardiomyopathy (Chronic)  ?  EF improved to 40 and 45% with large anterior defect secondary to known LAD occlusion.  No viability in this distribution, therefore no benefit from CTO PCI.Marland Kitchen ?Thankfully, the inferior and lateral walls seem to be doing well after PCI. ? ?Despite having reduced EF, he only notes NYHA Class I Symptoms of CHF. ?Plan: ?Continue current dose of Toprol and irbesartan.  Both are lower doses because of his orthostatic hypotension requiring midodrine in the past. ?He is very happy with the effects of Farxiga following removal and weight loss.  Energy level also improved. ?  ?  ? Relevant Medications  ? irbesartan (AVAPRO) 75 MG tablet  ? Hypertriglyceridemia (Chronic)  ?  On Vascepa along with Crestor 40 mg. ? ?  ?  ? Relevant Medications  ? irbesartan (AVAPRO) 75 MG tablet  ?  ? Other  ? Stage 3a chronic kidney disease ([place])  ?  Most recent creatinine was 1.27.  Monitor closely by PCP ? ?  ?  ? ? ?=================================== ? ?HPI:   ? ?Darryl [name] is a 71 y.o. male with a PMH below who presents today for 30-monthfollow-up ?at the request of JJanith Lima MD. ? ?Notable PMH: ?Acute inferior STEMI 07/13/2018: RCA PCI and staged PCI to RI.  Mid LAD CTO ?Mild ICM-(EF 40 to 45% with distal septal and apical akinesis.   ?Initial LV thrombus noted-no longer present as a January 2021 ?Orthostatic hypotension ?HLD ?Crohn's colitis (prior history of significant anemia related to azathioprine) ? ?Darryl [name] was last seen on September 14, 2020 via telemedicine video conference.  This was in response to a 30-minute episode of memory loss/amnesia.  Was working at his desk at work, and was not able to remember having enteroscopy or on.  He cannot function properly.  Despite this he was able to carry out conversations but just did not member what he is saying.  He recovered  spontaneously with no further issues. => Doing otherwise well for cardiac standpoint.  No angina or heart failure symptoms.  No further anginal symptoms.  Not getting as much exercise as he wanted to-indicated that he is not "taking care of himself "as well as he should.  Not eating as well and gained some weight.  He noted that he previously would ride 10 miles on the bike daily, and was walking routinely.  Not doing as much.  No Crohn's flares since changing his medications. ?Recommended SGLT2 inhibitor plus minus GLP-1 agonist Wilder Glade was placed ? ?Recent Hospitalizations: None ? ?Reviewed  CV studies:   ? ?The following studies were reviewed today: (if available, images/films reviewed: From Epic Chart or Care Everywhere) ?None: ? ? ?Interval History:  ? ?Darryl [name] returns here now for in person follow-up visit doing well.  I have not seen him in person since March 2021. ?He says that Dr. Ronnald Ramp started him on Farxiga after our last clinic visit via telemedicine, and he is energy level is notably improved his weight is down, no issues with edema.  He feels a whole lot better.  He also was started on Vascepa for her hypertriglyceridemia related diabetes.  These were tolerated that well also.  He is on Stelara for Crohn's and it seems to doing a lot better Humira. ? ?He is not really having that much of any more orthostatic dizziness.  Has not had to use midodrine at all since last visit. ? ?He is still active walking about 1 to 2 miles a day.  Has not been going to the gym as frequently but still doing his stretching exercises.  He does make it to the gym about 2 days a week doing stretching exercises with some strength and conditioning. ?He is not sure how things will change because he is getting ready to move to [place] [address] this week.  He is looking forward to getting involved in all the activities and exercises that are present there. ? ?He does note that he has been having a cough and there  was a question about maybe it could be related to ARB.  (I suggested that it probably is not related to irbesartan.  There has been some correlation with overlap ACE inhibitor cough with ARB such as losartan, but not the newer ones.) ? ?CV Review of Symptoms (Summary) ?Cardiovascular ROS: no chest pain or dyspnea on exertion ?negative for - edema, irregular heartbeat, orthopnea, palpitations, paroxysmal nocturnal dyspnea, rapid heart rate, shortness of breath, or syncope/near syncope or TIA/amaurosis fugax.  Orthostatic dizziness, claudication ? ?REVIEWED OF SYSTEMS  ? ?Review of Systems  ?Constitutional:  Positive for weight loss (Has also lost weight with [place].). Negative for malaise/fatigue (Energy level much better with [place].).  ?HENT:  Negative for congestion and nosebleeds.   ?Respiratory: Negative.    ?  Cardiovascular:  Negative for leg swelling (Notably improved with [place]).  ?     Per HPI  ?Gastrointestinal:  Negative for blood in stool and melena.  ?     Crohn's seems well controlled on Stelara  ?Genitourinary:  Negative for hematuria.  ?Musculoskeletal:  Positive for joint pain. Negative for falls.  ?Neurological:  Negative for dizziness (No use of PRN midodrine) and focal weakness.  ?Psychiatric/Behavioral:  Negative for depression and memory loss. The patient has insomnia (Just has a hard time getting to sleep because his mind is always racing.). The patient is not nervous/anxious.   ? ?I have reviewed and (if needed) personally updated the patient's problem list, medications, allergies, past medical and surgical history, social and family history.  ? ?PAST MEDICAL HISTORY  ? ?Past Medical History:  ?Diagnosis Date  ? Allergic rhinitis, cause unspecified   ? Anal stenosis   ? Asthma   ? Cataract   ? Chronic orthostatic hypotension   ? Related to borderline hypotension; intolerant of blood pressure medications.  On Midodrine  ? Coronary artery disease involving native coronary artery of native  heart without angina pectoris 06/2018  ?  p-mRCA 100% (DES PCI - p-mRCA 100% (DES PCI - Resolute Onyx 3.5 x 26 --> ~3.7 mm)), dRCA ~50% & PAV 40%; pRI 85% (Staged DES PCI  Resolute Onyx 2.25 x 15 - ~2.5 mm). 100

## 2021-06-19 NOTE — Patient Instructions (Addendum)
Medication Instructions:  ? ? Please check medication to see  which medication you are taking  Irbesartan  or Telmisartan  ?You only meed to be taking one.  ? Please call the office for instructions. ?   Addendum- patient return call to office - patient is taking 37.5 mg ( 1/2 tablet Irbesartan  daily) . Per patient , Primary Dr Ronnald Ramp switch medication to this dose from Telmisartan. svm,RN ? ?*If you need a refill on your cardiac medications before your next appointment, please call your pharmacy* ? ? ?Lab Work: ? ?Not needed ? ? ?Testing/Procedures: ?Not needed ? ?Follow-Up: ?At [facility], you and your health needs are our priority.  As part of our continuing mission to provide you with exceptional heart care, we have created designated Provider Care Teams.  These Care Teams include your primary Cardiologist (physician) and Advanced Practice Providers (APPs -  Physician Assistants and Nurse Practitioners) who all work together to provide you with the care you need, when you need it. ? ?  ? ?Your next appointment:   ?12 month(s) ? ?The format for your next appointment:   ?In Person ? ?Provider:   ?Glenetta Hew, MD  ? ? ? ?

## 2021-06-22 ENCOUNTER — Telehealth: Payer: Self-pay | Admitting: Internal Medicine

## 2021-06-22 NOTE — Telephone Encounter (Signed)
Lm on vm for patient to return call 

## 2021-06-22 NOTE — Telephone Encounter (Signed)
Returned call to pt. He reports that he is having a Crohn's flare, started two days ago. Pt reports diarrhea, has improved some with Imodium. Denies any abdominal pain, fever, or blood in the stool. Pt received his Stelara infusion on 05/03/21 and will be due for his 1st SubQ injection on 06/28/21. Pt is wanting to know if he can begin Stelara injections now or is there something else that you would like him to do in the interim? Please advise, thanks.  ?

## 2021-06-22 NOTE — Telephone Encounter (Signed)
Okay to proceed with for Stelara injection now ?If diarrhea does not improve in the next few days he should let me know and I would recommend; C. difficile PCR; fecal calprotectin; stool culture ?Thanks ?JMP ? ?

## 2021-06-22 NOTE — Telephone Encounter (Signed)
Inbound call from patient states he is to have his first stelara shot 4/5 and is currently have a crohn's flare. He would like to know if he can have his shot sooner. Best contact number [phone] ?

## 2021-06-22 NOTE — Telephone Encounter (Signed)
Pt returned call. We have reviewed Dr. Vena Rua recommendations. Pt verbalized understanding and had no concerns at the end of the call. ?

## 2021-06-26 ENCOUNTER — Other Ambulatory Visit: Payer: Self-pay | Admitting: Physician Assistant

## 2021-06-26 DIAGNOSIS — E785 Hyperlipidemia, unspecified: Secondary | ICD-10-CM

## 2021-06-28 ENCOUNTER — Other Ambulatory Visit (HOSPITAL_COMMUNITY): Payer: Self-pay

## 2021-07-11 ENCOUNTER — Other Ambulatory Visit: Payer: Self-pay | Admitting: Cardiology

## 2021-07-18 ENCOUNTER — Encounter: Payer: Self-pay | Admitting: Internal Medicine

## 2021-07-18 DIAGNOSIS — D485 Neoplasm of uncertain behavior of skin: Secondary | ICD-10-CM | POA: Diagnosis not present

## 2021-07-18 DIAGNOSIS — C44729 Squamous cell carcinoma of skin of left lower limb, including hip: Secondary | ICD-10-CM | POA: Diagnosis not present

## 2021-07-18 DIAGNOSIS — L309 Dermatitis, unspecified: Secondary | ICD-10-CM | POA: Diagnosis not present

## 2021-07-18 DIAGNOSIS — L820 Inflamed seborrheic keratosis: Secondary | ICD-10-CM | POA: Diagnosis not present

## 2021-07-18 DIAGNOSIS — C44722 Squamous cell carcinoma of skin of right lower limb, including hip: Secondary | ICD-10-CM | POA: Diagnosis not present

## 2021-07-28 ENCOUNTER — Encounter: Payer: Self-pay | Admitting: Cardiology

## 2021-07-28 NOTE — Assessment & Plan Note (Signed)
Three-vessel disease with PCI to the RCA and RI with known occlusion of the LAD.  He is pretty active and denies any anginal symptoms of chest pain or pressure.  He is essentially 3 years out ? ?On provide stable regimen. ?? Remains on Brilinta, but is 90 mg twice daily.  We will change prescription to maintenance dose of 60 mg twice daily. ?? Okay to hold Brilinta 5 days preop for surgical procedures. ?? He is on beta-blocker and ARB ?? On statin, co-Q10 and Vascepa ?? On Farxiga, tolerating well.-Noted benefit. ? ?

## 2021-07-28 NOTE — Assessment & Plan Note (Signed)
On Vascepa along with Crestor 40 mg. ?

## 2021-07-28 NOTE — Assessment & Plan Note (Addendum)
On combination of 40 mg Crestor and Vascepa as well as co-Q10. ? ?LDL direct measured at 72 on last check back in January. ?Hopefully with the addition of Vascepa and SGLT2 inhibitor for elevated triglycerides. ? ?Last A1c was 6.5 in January.  Pretty well controlled.  Should improve with Jardiance as well as lifestyle changes. ?

## 2021-07-28 NOTE — Assessment & Plan Note (Addendum)
EF improved to 40 and 45% with large anterior defect secondary to known LAD occlusion.  No viability in this distribution, therefore no benefit from CTO PCI.Marland Kitchen ?Thankfully, the inferior and lateral walls seem to be doing well after PCI. ? ?Despite having reduced EF, he only notes NYHA Class I Symptoms of CHF. ?Plan: ?? Continue current dose of Toprol and irbesartan.  Both are lower doses because of his orthostatic hypotension requiring midodrine in the past. ?? He is very happy with the effects of Farxiga following removal and weight loss.  Energy level also improved. ?

## 2021-07-28 NOTE — Assessment & Plan Note (Signed)
Most recent creatinine was 1.27.  Monitor closely by PCP ?

## 2021-07-28 NOTE — Assessment & Plan Note (Signed)
Blood pressure pretty well controlled on current dose of irbesartan and Toprol.  Would not change because he has had issues of orthostatic hypotension.  ?

## 2021-07-28 NOTE — Assessment & Plan Note (Signed)
I think his episode sees consistent with transient global amnesia more so than TIA.  No further episodes since last year. ?

## 2021-08-07 ENCOUNTER — Other Ambulatory Visit (HOSPITAL_COMMUNITY): Payer: Self-pay

## 2021-08-09 ENCOUNTER — Other Ambulatory Visit (HOSPITAL_COMMUNITY): Payer: Self-pay

## 2021-08-13 ENCOUNTER — Other Ambulatory Visit (HOSPITAL_COMMUNITY): Payer: Self-pay

## 2021-08-27 ENCOUNTER — Telehealth: Payer: Self-pay | Admitting: Internal Medicine

## 2021-08-27 DIAGNOSIS — L858 Other specified epidermal thickening: Secondary | ICD-10-CM | POA: Diagnosis not present

## 2021-08-27 DIAGNOSIS — L309 Dermatitis, unspecified: Secondary | ICD-10-CM | POA: Diagnosis not present

## 2021-08-27 DIAGNOSIS — C44722 Squamous cell carcinoma of skin of right lower limb, including hip: Secondary | ICD-10-CM | POA: Diagnosis not present

## 2021-08-27 DIAGNOSIS — Z85828 Personal history of other malignant neoplasm of skin: Secondary | ICD-10-CM | POA: Diagnosis not present

## 2021-08-27 NOTE — Telephone Encounter (Signed)
Inbound call from Dr. Ronnald Ramp from [facility] wanting to discuss patient with Dr. Hilarie Fredrickson. Dr. 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 Dr. Ronnald Ramp believes that it is coming from the medication patient takes for his GI issues. Dr. Ronnald Ramp is wanting to possibly start him on Acitretin which would help with the carcinoma, but could cause GI issues. Dr. Ronnald Ramp is requesting a call back to discuss. Please advise.      [phone]

## 2021-08-28 NOTE — Telephone Encounter (Signed)
I spoke to Dr. Jarome Matin by phone The patient's inflammatory skin issues are 80% better when switching Humira to Stelara. The patient has reported tremendous success from a GI perspective with his Crohn's colitis on Stelara.  He has maintained clinical remission.  However he is having increased nodular hyperkeratosis spots on the skin likely from immune down-regulation related to his Crohn's therapy.  This can be a proliferative in nature skin lesion. Dr. Ronnald Ramp is recommending Acitretin at relatively low dose 10 mg a day (he noted the psoriasis dose is 50 mg a day).  There is some risk of exacerbation of IBD with this medication.  He notes that typically if this occurs it improves with withdrawing the medication.  The medication is fat-soluble and can take some time to washout if it has to be stopped.  Patient's Crohn's is in deep remission endoscopically and histologically.  I feel that it is reasonable to try this medication for his skin issue.  I asked Dr. Ronnald Ramp to let Darryl [name] know that if he has any flare of his colitis symptoms with this medication to let me know immediately.

## 2021-08-28 NOTE — Telephone Encounter (Signed)
I attempted to call Dr. Ronnald Ramp at the number listed.  I reached voicemail and left my name and number

## 2021-08-29 NOTE — Telephone Encounter (Signed)
Noted  

## 2021-09-16 ENCOUNTER — Other Ambulatory Visit: Payer: Self-pay | Admitting: Internal Medicine

## 2021-09-17 ENCOUNTER — Other Ambulatory Visit: Payer: Self-pay | Admitting: Internal Medicine

## 2021-09-17 ENCOUNTER — Telehealth: Payer: Self-pay

## 2021-09-17 DIAGNOSIS — I10 Essential (primary) hypertension: Secondary | ICD-10-CM

## 2021-09-17 DIAGNOSIS — E118 Type 2 diabetes mellitus with unspecified complications: Secondary | ICD-10-CM

## 2021-09-17 MED ORDER — IRBESARTAN 75 MG PO TABS: 37.5000 mg | ORAL_TABLET | Freq: Every day | ORAL | 0 refills | Status: DC

## 2021-09-26 ENCOUNTER — Ambulatory Visit: Payer: Medicare PPO | Admitting: Internal Medicine

## 2021-09-26 ENCOUNTER — Encounter: Payer: Self-pay | Admitting: Internal Medicine

## 2021-09-26 VITALS — BP 128/84 | HR 77 | Temp 98.0°F | Resp 16 | Ht 66.0 in | Wt 181.0 lb

## 2021-09-26 DIAGNOSIS — N1831 Chronic kidney disease, stage 3a: Secondary | ICD-10-CM | POA: Diagnosis not present

## 2021-09-26 DIAGNOSIS — E118 Type 2 diabetes mellitus with unspecified complications: Secondary | ICD-10-CM | POA: Diagnosis not present

## 2021-09-26 DIAGNOSIS — I10 Essential (primary) hypertension: Secondary | ICD-10-CM | POA: Diagnosis not present

## 2021-09-26 LAB — HEMOGLOBIN A1C: Hgb A1c MFr Bld: 6.3 % (ref 4.6–6.5)

## 2021-09-26 LAB — CBC WITH DIFFERENTIAL/PLATELET
Basophils Absolute: 0.1 10*3/uL (ref 0.0–0.1)
Basophils Relative: 0.5 % (ref 0.0–3.0)
Eosinophils Absolute: 0 10*3/uL (ref 0.0–0.7)
Eosinophils Relative: 0.4 % (ref 0.0–5.0)
HCT: 45.4 % (ref 39.0–52.0)
Hemoglobin: 15.3 g/dL (ref 13.0–17.0)
Lymphocytes Relative: 14.4 % (ref 12.0–46.0)
Lymphs Abs: 1.5 10*3/uL (ref 0.7–4.0)
MCHC: 33.6 g/dL (ref 30.0–36.0)
MCV: 96.1 fl (ref 78.0–100.0)
Monocytes Absolute: 1.1 10*3/uL — ABNORMAL HIGH (ref 0.1–1.0)
Monocytes Relative: 10.8 % (ref 3.0–12.0)
Neutro Abs: 7.9 10*3/uL — ABNORMAL HIGH (ref 1.4–7.7)
Neutrophils Relative %: 73.9 % (ref 43.0–77.0)
Platelets: 267 10*3/uL (ref 150.0–400.0)
RBC: 4.73 Mil/uL (ref 4.22–5.81)
RDW: 13.4 % (ref 11.5–15.5)
WBC: 10.7 10*3/uL — ABNORMAL HIGH (ref 4.0–10.5)

## 2021-09-26 LAB — BASIC METABOLIC PANEL
BUN: 17 mg/dL (ref 6–23)
CO2: 24 mEq/L (ref 19–32)
Calcium: 9.5 mg/dL (ref 8.4–10.5)
Chloride: 104 mEq/L (ref 96–112)
Creatinine, Ser: 1.07 mg/dL (ref 0.40–1.50)
GFR: 69.49 mL/min (ref >60.00)
Glucose, Bld: 111 mg/dL — ABNORMAL HIGH (ref 70–99)
Potassium: 4.2 mEq/L (ref 3.5–5.1)
Sodium: 138 mEq/L (ref 135–145)

## 2021-09-26 NOTE — Patient Instructions (Signed)

## 2021-09-26 NOTE — Progress Notes (Signed)
Subjective:  Patient ID: Darryl [name], male    DOB: [birth year]  Age: 71 y.o. MRN: [id number]  CC: Hypertension and Diabetes   HPI TRELLIS VANOVERBEKE presents for f/up -  He is active and denies chest pain, shortness of breath, diaphoresis, or edema.  Outpatient Medications Prior to Visit  Medication Sig Dispense Refill   ADVAIR HFA 115-21 MCG/ACT inhaler INHALE TWO PUFFS TWICE DAILY AS NEEDED (SHAKE WELL AND RINSE MOUTH AFTER) 12 g 1   betamethasone dipropionate 0.05 % cream Apply topically.     BRILINTA 90 MG TABS tablet SMARTSIG:1 By Mouth     cholecalciferol (VITAMIN D3) 25 MCG (1000 UT) tablet Take 1,000 Units by mouth daily.     clopidogrel (PLAVIX) 75 MG tablet TAKE ONE TABLET ONCE DAILY 90 tablet 1   Coenzyme Q10 (CO Q 10 PO) Take 100 mg by mouth.     dapagliflozin propanediol (FARXIGA) 10 MG TABS tablet Take 1 tablet (10 mg total) by mouth daily before breakfast. 90 tablet 1   folic acid (FOLVITE) 1 MG tablet Take 1 mg by mouth daily.     irbesartan (AVAPRO) 75 MG tablet Take 0.5 tablets (37.5 mg total) by mouth daily. 45 tablet 0   loperamide (IMODIUM A-D) 2 MG tablet Take 2 mg by mouth 4 (four) times daily as needed for diarrhea or loose stools.     metoprolol succinate (TOPROL-XL) 25 MG 24 hr tablet TAKE 1 TABLET ONCE DAILY. 90 tablet 3   midodrine (PROAMATINE) 2.5 MG tablet TAKE 1 TAB IN AM. MAY TAKE AN ADDITIONAL TAB AFTER 6HR IF STILL DIZZY/SYSTOLIC WU<98 40 tablet 1   montelukast (SINGULAIR) 10 MG tablet TAKE ONE TABLET AT BEDTIME. 90 tablet 1   Multiple Vitamins-Minerals (CENTRUM SILVER PO) Take 1 tablet by mouth daily.     nitroGLYCERIN (NITROSTAT) 0.4 MG SL tablet 1 TAB UNDER TONGUE AS NEEDED FOR CHEST PAIN. MAY REPEAT EVERY 5 MIN FOR A TOTAL OF 3 DOSES. 25 tablet 3   pantoprazole (PROTONIX) 40 MG tablet Take 1 tablet (40 mg total) by mouth daily. 90 tablet 1   rosuvastatin (CRESTOR) 40 MG tablet TAKE 1 TABLET ONCE DAILY. 90 tablet 3   traMADol (ULTRAM) 50 MG tablet  TAKE ONE TABLET EVERY 6 HOURS AS NEEDED. 30 tablet 0   ustekinumab (STELARA) 90 MG/ML SOSY injection Inject Stelara 67m into skin every 8 weeks starting 06/28/21. 1 mL 11   VASCEPA 1 g capsule TAKE (2) CAPSULES BY MOUTH TWICE DAILY. 360 capsule 1   VENTOLIN HFA 108 (90 Base) MCG/ACT inhaler USE 2 PUFFS EVERY 6 HOURS AS NEEDED FOR WHEEZING. 18 g 1   vitamin B-12 (CYANOCOBALAMIN) 1000 MCG tablet Take 1 tablet (1,000 mcg total) by mouth daily.     zolpidem (AMBIEN) 10 MG tablet Take 1 tablet (10 mg total) by mouth at bedtime as needed. 30 tablet 0   No facility-administered medications prior to visit.    ROS Review of Systems  Constitutional:  Negative for diaphoresis, fatigue and unexpected weight change.  HENT: Negative.    Eyes: Negative.   Respiratory: Negative.  Negative for cough, shortness of breath and wheezing.   Cardiovascular:  Negative for chest pain, palpitations and leg swelling.  Gastrointestinal:  Negative for abdominal pain, constipation, diarrhea, nausea and vomiting.  Endocrine: Negative.   Genitourinary: Negative.  Negative for difficulty urinating.  Musculoskeletal: Negative.  Negative for arthralgias and myalgias.  Skin: Negative.   Neurological:  Negative for dizziness, weakness and  light-headedness.  Hematological:  Negative for adenopathy. Does not bruise/bleed easily.  Psychiatric/Behavioral: Negative.      Objective:  BP 128/84 (BP Location: Left Arm, Patient Position: Sitting, Cuff Size: Large)   Pulse 77   Temp 98 F (36.7 C) (Oral)   Resp 16   Ht 5' 6"  (1.676 m)   Wt 181 lb (82.1 kg)   SpO2 98%   BMI 29.21 kg/m   BP Readings from Last 3 Encounters:  09/26/21 128/84  06/19/21 110/72  05/03/21 105/68    Wt Readings from Last 3 Encounters:  09/26/21 181 lb (82.1 kg)  06/19/21 183 lb 3.2 oz (83.1 kg)  05/03/21 186 lb 3.2 oz (84.5 kg)    Physical Exam Vitals reviewed.  HENT:     Nose: Nose normal.     Mouth/Throat:     Mouth: Mucous  membranes are moist.  Eyes:     General: No scleral icterus.    Conjunctiva/sclera: Conjunctivae normal.  Cardiovascular:     Rate and Rhythm: Normal rate and regular rhythm.     Heart sounds: No murmur heard. Pulmonary:     Effort: Pulmonary effort is normal.     Breath sounds: No stridor. No wheezing, rhonchi or rales.  Abdominal:     General: Abdomen is flat.     Palpations: There is no mass.     Tenderness: There is no abdominal tenderness. There is no guarding.     Hernia: No hernia is present.  Musculoskeletal:        General: Normal range of motion.     Cervical back: Neck supple.     Right lower leg: No edema.     Left lower leg: No edema.  Lymphadenopathy:     Cervical: No cervical adenopathy.  Skin:    General: Skin is warm and dry.  Neurological:     General: No focal deficit present.     Mental Status: He is alert.  Psychiatric:        Mood and Affect: Mood normal.        Behavior: Behavior normal.     Lab Results  Component Value Date   WBC 10.7 (H) 09/26/2021   HGB 15.3 09/26/2021   HCT 45.4 09/26/2021   PLT 267.0 09/26/2021   GLUCOSE 111 (H) 09/26/2021   CHOL 154 04/05/2021   TRIG 213.0 (H) 04/05/2021   HDL 58.20 04/05/2021   LDLDIRECT 72.0 04/05/2021   LDLCALC 63 11/08/2019   ALT 28 04/05/2021   AST 23 04/05/2021   NA 138 09/26/2021   K 4.2 09/26/2021   CL 104 09/26/2021   CREATININE 1.07 09/26/2021   BUN 17 09/26/2021   CO2 24 09/26/2021   TSH 1.16 04/05/2021   PSA 0.18 04/05/2021   INR 2.8 04/02/2019   HGBA1C 6.3 09/26/2021   MICROALBUR 1.4 04/05/2021    MR Brain Wo Contrast  Result Date: 09/09/2020 CLINICAL DATA:  71 year old male status post TIA symptoms on 09/05/2020. Some residual confusion. EXAM: MRI HEAD WITHOUT CONTRAST TECHNIQUE: Multiplanar, multiecho pulse sequences of the brain and surrounding structures were obtained without intravenous contrast. COMPARISON:  Paranasal sinus CT 04/10/2011. FINDINGS: Brain: No restricted  diffusion to suggest acute infarction. No midline shift, mass effect, evidence of mass lesion, ventriculomegaly, extra-axial collection or acute intracranial hemorrhage. Cervicomedullary junction and pituitary are within normal limits. Cerebral volume loss appears somewhat disproportionate in the biparietal regions (series 8, image 24). But background gray and white matter signal appear normal for  age throughout the brain. No cortical encephalomalacia or chronic cerebral blood products are identified. Deep gray matter nuclei, brainstem and cerebellum appear within normal limits. Vascular: Major intracranial vascular flow voids are preserved. Skull and upper cervical spine: Negative for age visible cervical spine. Visualized bone marrow signal is within normal limits. Sinuses/Orbits: Postoperative changes to both globes. Otherwise negative orbits. Paranasal Visualized paranasal sinuses and mastoids are stable and well aerated. Other: Grossly normal visible internal auditory structures. There is a small circumscribed and perhaps mildly septated T2 hyperintense nodule in the left superior parotid gland with mildly increased diffusion. 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 Other visible scalp and face soft tissues appear negative. IMPRESSION: 1. No acute intracranial abnormality. Normal for age noncontrast MRI appearance of the brain aside from possible disproportionate volume loss in the biparietal regions. Query signs or symptoms of dementia. 2. 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 Electronically Signed   By: Genevie Ann M.D.   On: 09/09/2020 11:02    Assessment & Plan:   Matyas was seen today for hypertension and diabetes.  Diagnoses and all orders for this visit:  Essential hypertension- His blood pressure is well controlled. -     Basic metabolic panel; Future -     CBC with Differential/Platelet;  Future -     CBC with Differential/Platelet -     Basic metabolic panel  Stage 3a chronic kidney disease ([place])- His renal function has improved.  Will continue [place]. -     Basic metabolic panel; Future -     Basic metabolic panel  Type II diabetes mellitus with manifestations ([place])- His blood sugar is very well controlled. -     Hemoglobin A1c; Future -     Hemoglobin A1c   I am having Karl Pock "Philipp Ovens" maintain his vitamin B-12, Multiple Vitamins-Minerals (CENTRUM SILVER PO), loperamide, cholecalciferol, midodrine, zolpidem, Coenzyme Q10 (CO Q 10 PO), traMADol, nitroGLYCERIN, folic acid, pantoprazole, dapagliflozin propanediol, metoprolol succinate, Advair HFA, Ventolin HFA, Vascepa, montelukast, Stelara, betamethasone dipropionate, Brilinta, rosuvastatin, clopidogrel, and irbesartan.  No orders of the defined types were placed in this encounter.    Follow-up: Return in about 6 months (around 03/29/2022).  Scarlette Calico, MD

## 2021-09-27 DIAGNOSIS — Z85828 Personal history of other malignant neoplasm of skin: Secondary | ICD-10-CM | POA: Diagnosis not present

## 2021-09-27 DIAGNOSIS — C44722 Squamous cell carcinoma of skin of right lower limb, including hip: Secondary | ICD-10-CM | POA: Diagnosis not present

## 2021-10-04 ENCOUNTER — Other Ambulatory Visit: Payer: Self-pay | Admitting: Internal Medicine

## 2021-10-04 ENCOUNTER — Other Ambulatory Visit (HOSPITAL_COMMUNITY): Payer: Self-pay

## 2021-10-04 DIAGNOSIS — K219 Gastro-esophageal reflux disease without esophagitis: Secondary | ICD-10-CM

## 2021-10-08 ENCOUNTER — Other Ambulatory Visit (HOSPITAL_COMMUNITY): Payer: Self-pay

## 2021-10-10 ENCOUNTER — Other Ambulatory Visit: Payer: Self-pay | Admitting: Internal Medicine

## 2021-10-10 DIAGNOSIS — E118 Type 2 diabetes mellitus with unspecified complications: Secondary | ICD-10-CM

## 2021-10-10 DIAGNOSIS — N1831 Chronic kidney disease, stage 3a: Secondary | ICD-10-CM

## 2021-10-19 ENCOUNTER — Other Ambulatory Visit (HOSPITAL_COMMUNITY): Payer: Self-pay

## 2021-10-24 ENCOUNTER — Other Ambulatory Visit: Payer: Self-pay | Admitting: Internal Medicine

## 2021-10-24 DIAGNOSIS — J4520 Mild intermittent asthma, uncomplicated: Secondary | ICD-10-CM

## 2021-11-02 ENCOUNTER — Ambulatory Visit (INDEPENDENT_AMBULATORY_CARE_PROVIDER_SITE_OTHER): Payer: Medicare PPO

## 2021-11-02 DIAGNOSIS — Z0000 Encounter for general adult medical examination without abnormal findings: Secondary | ICD-10-CM | POA: Diagnosis not present

## 2021-11-02 NOTE — Patient Instructions (Signed)
Darryl [name] , Thank you for taking time to come for your Medicare Wellness Visit. I appreciate your ongoing commitment to your health goals. Please review the following plan we discussed and let me know if I can assist you in the future.   Screening recommendations/referrals: Colonoscopy: 04/11/2020 Recommended yearly ophthalmology/optometry visit for glaucoma screening and checkup Recommended yearly dental visit for hygiene and checkup  Vaccinations: Influenza vaccine: completed  Pneumococcal vaccine: completed  Tdap vaccine: 05/19/2013 Shingles vaccine: completed     Advanced directives: yes   Conditions/risks identified: none   Next appointment: none   Preventive Care 72 Years and Older, Male Preventive care refers to lifestyle choices and visits with your health care provider that can promote health and wellness. What does preventive care include? A yearly physical exam. This is also called an annual well check. Dental exams once or twice a year. Routine eye exams. Ask your health care provider how often you should have your eyes checked. Personal lifestyle choices, including: Daily care of your teeth and gums. Regular physical activity. Eating a healthy diet. Avoiding tobacco and drug use. Limiting alcohol use. Practicing safe sex. Taking low doses of aspirin every day. Taking vitamin and mineral supplements as recommended by your health care provider. What happens during an annual well check? The services and screenings done by your health care provider during your annual well check will depend on your age, overall health, lifestyle risk factors, and family history of disease. Counseling  Your health care provider may ask you questions about your: Alcohol use. Tobacco use. Drug use. Emotional well-being. Home and relationship well-being. Sexual activity. Eating habits. History of falls. Memory and ability to understand (cognition). Work and work  Statistician. Screening  You may have the following tests or measurements: Height, weight, and BMI. Blood pressure. Lipid and cholesterol levels. These may be checked every 5 years, or more frequently if you are over 72 years old. Skin check. Lung cancer screening. You may have this screening every year starting at age 72 if you have a 30-pack-year history of smoking and currently smoke or have quit within the past 15 years. Fecal occult blood test (FOBT) of the stool. You may have this test every year starting at age 72. Flexible sigmoidoscopy or colonoscopy. You may have a sigmoidoscopy every 5 years or a colonoscopy every 10 years starting at age 72. Prostate cancer screening. Recommendations will vary depending on your family history and other risks. Hepatitis C blood test. Hepatitis B blood test. Sexually transmitted disease (STD) testing. Diabetes screening. This is done by checking your blood sugar (glucose) after you have not eaten for a while (fasting). You may have this done every 1-3 years. Abdominal aortic aneurysm (AAA) screening. You may need this if you are a current or former smoker. Osteoporosis. You may be screened starting at age 72 if you are at high risk. Talk with your health care provider about your test results, treatment options, and if necessary, the need for more tests. Vaccines  Your health care provider may recommend certain vaccines, such as: Influenza vaccine. This is recommended every year. Tetanus, diphtheria, and acellular pertussis (Tdap, Td) vaccine. You may need a Td booster every 10 years. Zoster vaccine. You may need this after age 72. Pneumococcal 13-valent conjugate (PCV13) vaccine. One dose is recommended after age 72. Pneumococcal polysaccharide (PPSV23) vaccine. One dose is recommended after age 72. Talk to your health care provider about which screenings and vaccines you need and how often you need  them. This information is not intended to replace  advice given to you by your health care provider. Make sure you discuss any questions you have with your health care provider. Document Released: 04/07/2015 Document Revised: 11/29/2015 Document Reviewed: 01/10/2015 Elsevier Interactive Patient Education  2017 [place] Prevention in the Home Falls can cause injuries. They can happen to people of all ages. There are many things you can do to make your home safe and to help prevent falls. What can I do on the outside of my home? Regularly fix the edges of walkways and driveways and fix any cracks. Remove anything that might make you trip as you walk through a door, such as a raised step or threshold. Trim any bushes or trees on the path to your home. Use bright outdoor lighting. Clear any walking paths of anything that might make someone trip, such as rocks or tools. Regularly check to see if handrails are loose or broken. Make sure that both sides of any steps have handrails. Any raised decks and porches should have guardrails on the edges. Have any leaves, snow, or ice cleared regularly. Use sand or salt on walking paths during winter. Clean up any spills in your garage right away. This includes oil or grease spills. What can I do in the bathroom? Use night lights. Install grab bars by the toilet and in the tub and shower. Do not use towel bars as grab bars. Use non-skid mats or decals in the tub or shower. If you need to sit down in the shower, use a plastic, non-slip stool. Keep the floor dry. Clean up any water that spills on the floor as soon as it happens. Remove soap buildup in the tub or shower regularly. Attach bath mats securely with double-sided non-slip rug tape. Do not have throw rugs and other things on the floor that can make you trip. What can I do in the bedroom? Use night lights. Make sure that you have a light by your bed that is easy to reach. Do not use any sheets or blankets that are too big for your bed.  They should not hang down onto the floor. Have a firm chair that has side arms. You can use this for support while you get dressed. Do not have throw rugs and other things on the floor that can make you trip. What can I do in the kitchen? Clean up any spills right away. Avoid walking on wet floors. Keep items that you use a lot in easy-to-reach places. If you need to reach something above you, use a strong step stool that has a grab bar. Keep electrical cords out of the way. Do not use floor polish or wax that makes floors slippery. If you must use wax, use non-skid floor wax. Do not have throw rugs and other things on the floor that can make you trip. What can I do with my stairs? Do not leave any items on the stairs. Make sure that there are handrails on both sides of the stairs and use them. Fix handrails that are broken or loose. Make sure that handrails are as long as the stairways. Check any carpeting to make sure that it is firmly attached to the stairs. Fix any carpet that is loose or worn. Avoid having throw rugs at the top or bottom of the stairs. If you do have throw rugs, attach them to the floor with carpet tape. Make sure that you have a light switch at  the top of the stairs and the bottom of the stairs. If you do not have them, ask someone to add them for you. What else can I do to help prevent falls? Wear shoes that: Do not have high heels. Have rubber bottoms. Are comfortable and fit you well. Are closed at the toe. Do not wear sandals. If you use a stepladder: Make sure that it is fully opened. Do not climb a closed stepladder. Make sure that both sides of the stepladder are locked into place. Ask someone to hold it for you, if possible. Clearly mark and make sure that you can see: Any grab bars or handrails. First and last steps. Where the edge of each step is. Use tools that help you move around (mobility aids) if they are needed. These  include: Canes. Walkers. Scooters. Crutches. Turn on the lights when you go into a dark area. Replace any light bulbs as soon as they burn out. Set up your furniture so you have a clear path. Avoid moving your furniture around. If any of your floors are uneven, fix them. If there are any pets around you, be aware of where they are. Review your medicines with your doctor. Some medicines can make you feel dizzy. This can increase your chance of falling. Ask your doctor what other things that you can do to help prevent falls. This information is not intended to replace advice given to you by your health care provider. Make sure you discuss any questions you have with your health care provider. Document Released: 01/05/2009 Document Revised: 08/17/2015 Document Reviewed: 04/15/2014 Elsevier Interactive Patient Education  2017 [employer].

## 2021-11-02 NOTE — Progress Notes (Signed)
Subjective:   Darryl [name] is a 71 y.o. male who presents for an Subsequent  Medicare Annual Wellness Visit.   I connected with Darryl [name]  today by telephone and verified that I am speaking with the correct person using two identifiers. Location patient: home Location provider: work Persons participating in the virtual visit: patient, provider.   I discussed the limitations, risks, security and privacy concerns of performing an evaluation and management service by telephone and the availability of in person appointments. I also discussed with the patient that there may be a patient responsible charge related to this service. The patient expressed understanding and verbally consented to this telephonic visit.    Interactive audio and video telecommunications were attempted between this provider and patient, however failed, due to patient having technical difficulties OR patient did not have access to video capability.  We continued and completed visit with audio only.    Review of Systems     Cardiac Risk Factors include: advanced age (>91mn, >>9women);diabetes mellitus;male gender     Objective:    Today's Vitals   There is no height or weight on file to calculate BMI.     11/02/2021   10:19 AM 04/11/2020   11:21 AM 09/21/2018   10:00 PM 09/21/2018    6:44 PM 07/23/2018    2:33 PM 07/31/2016   10:21 AM 07/07/2015   11:48 AM  Advanced Directives  Does Patient Have a Medical Advance Directive? Yes Yes Yes Yes Yes Yes Yes  Type of AParamedicof [place]oll[place]Living will [place]Living will [place]Living will;Out of facility DNR (pink MOST or yellow form) [place]Living will;Out of facility DNR (pink MOST or yellow form) HLa Feria NorthLiving will HCresseyLiving will HBaldwynLiving will  Does patient want to make changes to medical advance  directive?  No - Patient declined No - Patient declined  No - Patient declined  No - Patient declined  Copy of [place] Chart? No - copy requested    No - copy requested Yes No - copy requested  Would patient like information on creating a medical advance directive?    No - Patient declined       Current Medications (verified) Outpatient Encounter Medications as of 11/02/2021  Medication Sig   ADVAIR HFA 115-21 MCG/ACT inhaler INHALE TWO PUFFS TWICE DAILY AS NEEDED (SHAKE WELL AND RINSE MOUTH AFTER)   betamethasone dipropionate 0.05 % cream Apply topically.   BRILINTA 90 MG TABS tablet SMARTSIG:1 By Mouth   cholecalciferol (VITAMIN D3) 25 MCG (1000 UT) tablet Take 1,000 Units by mouth daily.   clopidogrel (PLAVIX) 75 MG tablet TAKE ONE TABLET ONCE DAILY   Coenzyme Q10 (CO Q 10 PO) Take 100 mg by mouth.   FARXIGA 10 MG TABS tablet Take 1 tablet (10 mg total) by mouth daily before breakfast.   folic acid (FOLVITE) 1 MG tablet Take 1 mg by mouth daily.   irbesartan (AVAPRO) 75 MG tablet Take 0.5 tablets (37.5 mg total) by mouth daily.   loperamide (IMODIUM A-D) 2 MG tablet Take 2 mg by mouth 4 (four) times daily as needed for diarrhea or loose stools.   metoprolol succinate (TOPROL-XL) 25 MG 24 hr tablet TAKE 1 TABLET ONCE DAILY.   midodrine (PROAMATINE) 2.5 MG tablet TAKE 1 TAB IN AM. MAY TAKE AN ADDITIONAL TAB AFTER 6HR IF STILL DIZZY/SYSTOLIC BWP<80  montelukast (SINGULAIR) 10 MG tablet TAKE ONE TABLET AT BEDTIME.   Multiple Vitamins-Minerals (CENTRUM SILVER PO) Take 1 tablet by mouth daily.   nitroGLYCERIN (NITROSTAT) 0.4 MG SL tablet 1 TAB UNDER TONGUE AS NEEDED FOR CHEST PAIN. MAY REPEAT EVERY 5 MIN FOR A TOTAL OF 3 DOSES.   pantoprazole (PROTONIX) 40 MG tablet Take 1 tablet (40 mg total) by mouth daily.   rosuvastatin (CRESTOR) 40 MG tablet TAKE 1 TABLET ONCE DAILY.   traMADol (ULTRAM) 50 MG tablet TAKE ONE TABLET EVERY 6 HOURS AS NEEDED.   ustekinumab (STELARA) 90  MG/ML SOSY injection Inject Stelara 51m into skin every 8 weeks starting 06/28/21.   VASCEPA 1 g capsule TAKE (2) CAPSULES BY MOUTH TWICE DAILY.   VENTOLIN HFA 108 (90 Base) MCG/ACT inhaler USE 2 PUFFS EVERY 6 HOURS AS NEEDED FOR WHEEZING.   vitamin B-12 (CYANOCOBALAMIN) 1000 MCG tablet Take 1 tablet (1,000 mcg total) by mouth daily.   zolpidem (AMBIEN) 10 MG tablet Take 1 tablet (10 mg total) by mouth at bedtime as needed.   No facility-administered encounter medications on file as of 11/02/2021.    Allergies (verified) Allopurinol, Asacol [mesalamine], Mercaptopurine, Amoxicillin, Penicillins, and Sulfamethoxazole   History: Past Medical History:  Diagnosis Date   Allergic rhinitis, cause unspecified    Anal stenosis    Asthma    Cataract    Chronic orthostatic hypotension    Related to borderline hypotension; intolerant of blood pressure medications.  On Midodrine   Coronary artery disease involving native coronary artery of native heart without angina pectoris 06/2018    p-mRCA 100% (DES PCI - p-mRCA 100% (DES PCI - Resolute Onyx 3.5 x 26 --> ~3.7 mm)), dRCA ~50% & PAV 40%; pRI 85% (Staged DES PCI  Resolute Onyx 2.25 x 15 - ~2.5 mm). 100% LAD CTO after small D1 with ost-prox 60%. R-L collaterals to LAD seen post PCI.   Cro[place]'s colitis [place]    Exostosis of unspecified site    GERD (gastroesophageal reflux disease)    History of acute inferior wall MI 06/2018   100% p-mRCA -- DES PCI (complicated by mild cardiogenic shock, partially related to 100% LAD CTO) --> staged PCI-RI   Hyperlipidemia with target LDL less than 70 07/04/2015   Now s/p MI with MV CAD - optimal LDL < 50   Hyperplastic colon polyp 02/26/2008   Hypertension    Ischemic cardiomyopathy    Initial echo shows EF of 40 to 45% with anterior hypokinesis. Cardiac MRI shows EF of 49% with anterior anteroapical hypokinesis/akinesis and LV thrombus.   Lateral epicondylitis  of elbow    Left ventricular thrombus following MI  ([facility] ==> resolved as of January 2021 10/10/2018   Cardiac MRI 09/28/2018: Mildly dilated LV.  EF~ 49%.  Mid Anteriopr/Anteroseptal & Apical Anterior HK.  LV APICAL THROMBUS (partially layered) 22 x 17 x 11. ==> b) f/u Echo 03/2019 -No LV Thrombus (NO LONGER ON WARFARIN)   Leukopenia    Macrocytic anemia    Related to azathioprine   Myocardial infarction ([facility]    Nontraumatic rupture of patellar tendon    Obesity, unspecified    Regional enteritis of unspecified site    Seasonal allergies    Sessile colonic polyp    Vitamin B12 deficiency    Past Surgical History:  Procedure Laterality Date   CARDIAC MRI  09/2018   Mildly dilated LV.  EF estimated roughly 49%.  Hypokinesis of the mid anterior, anteroseptal and apical anterior wall.  There is also apical thrombus (partially layered) 22 x 17 x 11. -->  50-75% late gadolinium enhancement of the mid anterior, anteroseptal and apical anterior and septal walls suggestive of near full-thickness scar w/ minimal viability (poor recovery prognosis w/ REVASCULARIZATION   COLONOSCOPY     2016   CORONARY STENT INTERVENTION N/A 07/24/2018   Procedure: CORONARY STENT INTERVENTION;  Surgeon: Troy Sine, MD;  Location: [facility];  Service: Cardiovascular: Staged PCI pRI 85-90%: Resolute Onyx DES 2.25 x 15 (2.5 mm)   CORONARY/GRAFT ACUTE MI REVASCULARIZATION N/A 07/23/2018   Procedure: CORONARY/GRAFT ACUTE MI REVASCULARIZATION;  Surgeon: [name], MD;  Location: [facility];  Service: Cardiovascular;::  p-mRCA 100% (DES PCI - p-mRCA 100% (DES PCI - Resolute Onyx 3.5 x 26 (post-dilated ~3.7 mm)), dRCA ~50% & PAV 40%; pRI 85% (Staged PCI). 100% LAD CTO after small D1 with ost-prox 60%. R-L collaterals to LAD seen post PCI.;    LEFT HEART CATH N/A 07/24/2018   Procedure: LEFT HEART CATH;  Surgeon: Troy Sine, MD;  Location: [facility];  Service: Cardiovascular;  Laterality: N/A; 85-90% pRCA, 60% ost D1 then 100% CTO of LAD --  staged DES PCI LAD   LEFT HEART CATH AND CORONARY ANGIOGRAPHY  07/23/2018   Procedure: LEFT HEART CATH;  Surgeon: [name]., MD;  Location: [facility]   p-mRCA 100% (DES PCI),dRCA ~50% & PAV 40%; pRI 85% (Staged PCI). 100% LAD CTO after small D1 with ost-prox 60%. R-L collaterals to LAD seen post PCI.;    RIGHT HEART CATH N/A 07/23/2018   Procedure: RIGHT HEART CATH;  Surgeon: [name], MD;  Location: [facility];  Service: Cardiovascular;  Normal RHC #s, CO/CI normal   TONSILLECTOMY AND ADENOIDECTOMY     TRANSTHORACIC ECHOCARDIOGRAM  07/23/2018   Insetting of inferior MI with occluded LAD CTO; --Severe akinesis of the mid anteroseptal wall.  EF 40 and 45%.  GR 1 DD.;;     TRANSTHORACIC ECHOCARDIOGRAM  04/06/2019   To follow-up EF and LV thrombus:  EF 40 and 45%.  Distal septal and apical akinesis.  No LV thrombus noted.  Mild LVH.  GR 1 DD.  Normal RV.  Mild LA dilation.  Normal RA.  Mild aortic valve sclerosis with no stenosis - mild AI   --> warfarin discontinued.  Continued on Plavix alone.   Family History  Problem Relation Age of Onset   Diabetes Brother    Diabetes Father    Heart attack Father    Hypertension Father    Heart disease Father    Colon cancer Neg Hx    Stomach cancer Neg Hx    Hyperlipidemia Neg Hx    Sudden death Neg Hx    Cancer Neg Hx    COPD Neg Hx    Alcohol abuse Neg Hx    Drug abuse Neg Hx    Stroke Neg Hx    Rectal cancer Neg Hx    Esophageal cancer Neg Hx    Social History   Socioeconomic History   Marital status: Married    Spouse name: Not on file   Number of children: Not on file   Years of education: Not on file   Highest education level: Not on file  Occupational History   Occupation: accountant  Tobacco Use   Smoking status: Never   Smokeless tobacco: Never  Vaping Use   Vaping Use: Never used  Substance  and Sexual Activity   Alcohol use: Yes    Alcohol/week: 0.0 standard drinks of alcohol    Comment:  rare   Drug use: No   Sexual activity: Yes  Other Topics Concern   Not on file  Social History Narrative   Not on file   Social Determinants of Health   Financial Resource Strain: Low Risk  (11/02/2021)   Overall Financial Resource Strain (CARDIA)    Difficulty of Paying Living Expenses: Not hard at all  Food Insecurity: No Food Insecurity (11/02/2021)   Hunger Vital Sign    Worried About Running Out of Food in the Last Year: Never true    Ran Out of Food in the Last Year: Never true  Transportation Needs: No Transportation Needs (11/02/2021)   PRAPARE - Hydrologist (Medical): No    Lack of Transportation (Non-Medical): No  Physical Activity: Sufficiently Active (11/02/2021)   Exercise Vital Sign    Days of Exercise per Week: 5 days    Minutes of Exercise per Session: 30 min  Stress: No Stress Concern Present (11/02/2021)   [place]    Feeling of Stress : Not at all  Social Connections: [place] (11/02/2021)   Social Connection and Isolation Panel [NHANES]    Frequency of Communication with Friends and Family: Three times a week    Frequency of Social Gatherings with Friends and Family: Three times a week    Attends Religious Services: More than 4 times per year    Active Member of Clubs or Organizations: Yes    Attends Archivist Meetings: 1 to 4 times per year    Marital Status: Married    Tobacco Counseling Counseling given: Not Answered   Clinical Intake:  Pre-visit preparation completed: Yes  Pain : No/denies pain     Nutritional Risks: None Diabetes: Yes CBG done?: No Did pt. bring in CBG monitor from home?: No  How often do you need to have someone help you when you read instructions, pamphlets, or other written materials from your doctor or pharmacy?: 1 - Never What is the last grade level you completed in school?:  masters  Diabetic?yes  Nutrition Risk Assessment:  Has the patient had any N/V/D within the last 2 months?  No  Does the patient have any non-healing wounds?  No  Has the patient had any unintentional weight loss or weight gain?  No   Diabetes:  Is the patient diabetic?  Yes  If diabetic, was a CBG obtained today?  No  Did the patient bring in their glucometer from home?  No  How often do you monitor your CBG's? Never   Financial Strains and Diabetes Management:  Are you having any financial strains with the device, your supplies or your medication? No .  Does the patient want to be seen by Chronic Care Management for management of their diabetes?  No  Would the patient like to be referred to a Nutritionist or for Diabetic Management?  No   Diabetic Exams:  Diabetic Eye Exam: Completed 11/2020 Diabetic Foot Exam: Overdue, Pt has been advised about the importance in completing this exam. Pt is scheduled for diabetic foot exam on next office visit .   Interpreter Needed?: No  Information entered by :: L.Wilson,LPN   Activities of Daily Living    11/02/2021   10:21 AM  In your present state of health, do you have any difficulty  performing the following activities:  Hearing? 0  Vision? 0  Difficulty concentrating or making decisions? 0  Walking or climbing stairs? 0  Dressing or bathing? 0  Doing errands, shopping? 0  Preparing Food and eating ? N  Using the Toilet? N  In the past six months, have you accidently leaked urine? N  Do you have problems with loss of bowel control? N  Managing your Medications? N  Managing your Finances? N  Housekeeping or managing your Housekeeping? N    Patient Care Team: Janith Lima, MD as PCP - General (Internal Medicine) [name], MD as PCP - Cardiology ([facility] [place] any recent Medical Services you may have received from other than [facility] providers in the past year (date may be  approximate).     Assessment:   This is a routine wellness examination for Jantz.  Hearing/Vision screen Vision Screening - Comments:: Annual eye exams   Dietary issues and exercise activities discussed: Current Exercise Habits: Home exercise routine, Type of exercise: walking, Time (Minutes): 30, Frequency (Times/Week): 5, Weekly Exercise (Minutes/Week): 150, Intensity: Mild, Exercise limited by: None identified   Goals Addressed   None    Depression Screen    11/02/2021   10:20 AM 11/02/2021   10:18 AM 04/05/2021    2:41 PM 11/08/2019    8:48 AM 12/04/2018    9:26 AM 10/22/2018    2:27 PM 10/02/2018    8:12 AM  PHQ 2/9 Scores  PHQ - 2 Score 0 0 0 0 0 0 0    Fall Risk    11/02/2021   10:19 AM 04/05/2021    2:36 PM 11/08/2019    8:48 AM 02/24/2018   12:31 PM 02/23/2018   11:16 AM  [place] in the past year? 0 0 0 0 0  Number falls in past yr: 0  0  0  Injury with Fall? 0    0  Risk for fall due to :   No Fall Risks    Follow up Falls evaluation completed;Education provided  Falls evaluation completed  Falls evaluation completed    [place]:  Any stairs in or around the home? No  If so, are there any without handrails? No  Home free of loose throw rugs in walkways, pet beds, electrical cords, etc? Yes  Adequate lighting in your home to reduce risk of falls? Yes   ASSISTIVE DEVICES UTILIZED TO PREVENT FALLS:  Life alert? Yes  Use of a cane, walker or w/c? No  Grab bars in the bathroom? Yes  Shower chair or bench in shower? Yes  Elevated toilet seat or a handicapped toilet? Yes     Cognitive Function:    Normal cognitive status assessed by telephone conversation  by this Nurse Health Advisor. No abnormalities found.      11/02/2021   10:22 AM  6CIT Screen  What Year? 0 points  What month? 0 points  What time? 0 points  Count back from 20 0 points  Months in reverse 0 points  Repeat phrase 0 points  Total Score 0  points    Immunizations Immunization History  Administered Date(s) Administered   Fluad Quad(high Dose 65+) 01/21/2019, 01/31/2021   Hepb-cpg 10/12/2018, 11/13/2018   Influenza, High Dose Seasonal PF 01/07/2017, 02/23/2018   Influenza,inj,Quad PF,6+ Mos 02/05/2013, 02/01/2020   PFIZER(Purple Top)SARS-COV-2 Vaccination 05/01/2019, 05/22/2019, 11/23/2019   PPD Test 11/26/2011   Pneumococcal Conjugate-13  07/04/2015   Pneumococcal Polysaccharide-23 05/19/2013, 02/04/2019   Tdap 05/19/2013   Zoster Recombinat (Shingrix) 12/14/2019, 04/14/2020    TDAP status: Up to date  Flu Vaccine status: Up to date  Pneumococcal vaccine status: Up to date  Covid-19 vaccine status: Completed vaccines  Qualifies for Shingles Vaccine? Yes   Zostavax completed Yes   Shingrix Completed?: Yes  Screening Tests Health Maintenance  Topic Date Due   COVID-19 Vaccine (4 - Pfizer series) 01/18/2020   INFLUENZA VACCINE  10/23/2021   OPHTHALMOLOGY EXAM  11/23/2021   HEMOGLOBIN A1C  03/29/2022   Diabetic kidney evaluation - Urine ACR  04/05/2022   FOOT EXAM  04/06/2022   COLONOSCOPY (Pts 45-2yr Insurance coverage will need to be confirmed)  04/11/2022   Diabetic kidney evaluation - GFR measurement  09/27/2022   TETANUS/TDAP  05/20/2023   Pneumonia Vaccine 72 Years old  Completed   Hepatitis C Screening  Completed   Zoster Vaccines- Shingrix  Completed   HPV VACCINES  Aged Out    Health Maintenance  Health Maintenance Due  Topic Date Due   COVID-19 Vaccine (4 - Pfizer series) 01/18/2020   INFLUENZA VACCINE  10/23/2021    Colorectal cancer screening: Type of screening: Colonoscopy. Completed 04/11/2020. Repeat every 10 years  Lung Cancer Screening: (Low Dose CT Chest recommended if Age 72-80years, 30 pack-year currently smoking OR have quit w/in 15years.) does not qualify.   Lung Cancer Screening Referral: n/a  Additional Screening:  Hepatitis C Screening: does not qualify;   Vision  Screening: Recommended annual ophthalmology exams for early detection of glaucoma and other disorders of the eye. Is the patient up to date with their annual eye exam?  Yes  Who is the provider or what is the name of the office in which the patient attends annual eye exams? Dr.Wood  If pt is not established with a provider, would they like to be referred to a provider to establish care? No .   Dental Screening: Recommended annual dental exams for proper oral hygiene  Community Resource Referral / Chronic Care Management: CRR required this visit?  No   CCM required this visit?  No      Plan:     I have personally reviewed and noted the following in the patient's chart:   Medical and social history Use of alcohol, tobacco or illicit drugs  Current medications and supplements including opioid prescriptions. Patient is not currently taking opioid prescriptions. Functional ability and status Nutritional status Physical activity Advanced directives List of other physicians Hospitalizations, surgeries, and ER visits in previous 12 months Vitals Screenings to include cognitive, depression, and falls Referrals and appointments  In addition, I have reviewed and discussed with patient certain preventive protocols, quality metrics, and best practice recommendations. A written personalized care plan for preventive services as well as general preventive health recommendations were provided to patient.     LDaphane Shepherd LPN   [id number]  Nurse Notes: none

## 2021-11-23 ENCOUNTER — Other Ambulatory Visit: Payer: Self-pay | Admitting: Internal Medicine

## 2021-11-23 ENCOUNTER — Other Ambulatory Visit (HOSPITAL_COMMUNITY): Payer: Self-pay

## 2021-11-28 DIAGNOSIS — Z85828 Personal history of other malignant neoplasm of skin: Secondary | ICD-10-CM | POA: Diagnosis not present

## 2021-11-28 DIAGNOSIS — L309 Dermatitis, unspecified: Secondary | ICD-10-CM | POA: Diagnosis not present

## 2021-11-28 DIAGNOSIS — L308 Other specified dermatitis: Secondary | ICD-10-CM | POA: Diagnosis not present

## 2021-11-28 DIAGNOSIS — Z79899 Other long term (current) drug therapy: Secondary | ICD-10-CM | POA: Diagnosis not present

## 2021-12-07 ENCOUNTER — Other Ambulatory Visit (HOSPITAL_COMMUNITY): Payer: Self-pay

## 2021-12-19 ENCOUNTER — Other Ambulatory Visit: Payer: Self-pay | Admitting: Internal Medicine

## 2021-12-19 DIAGNOSIS — I10 Essential (primary) hypertension: Secondary | ICD-10-CM

## 2021-12-19 DIAGNOSIS — E118 Type 2 diabetes mellitus with unspecified complications: Secondary | ICD-10-CM

## 2021-12-31 ENCOUNTER — Ambulatory Visit: Payer: Medicare PPO | Admitting: Cardiology

## 2022-01-15 ENCOUNTER — Other Ambulatory Visit: Payer: Self-pay | Admitting: Cardiology

## 2022-01-16 NOTE — Telephone Encounter (Signed)
RN called patient to confirmed medication he is taking at present time. Patient states he is only taking Clopidogrel 75 mg daily. He no longer takes Brillinta.  Medication list updated to reflect the change.     Refill e-sent to pharmacy.

## 2022-01-24 ENCOUNTER — Other Ambulatory Visit (HOSPITAL_COMMUNITY): Payer: Self-pay

## 2022-01-28 ENCOUNTER — Other Ambulatory Visit (HOSPITAL_COMMUNITY): Payer: Self-pay

## 2022-01-30 ENCOUNTER — Other Ambulatory Visit (HOSPITAL_COMMUNITY): Payer: Self-pay

## 2022-02-01 ENCOUNTER — Other Ambulatory Visit (HOSPITAL_COMMUNITY): Payer: Self-pay

## 2022-02-05 ENCOUNTER — Other Ambulatory Visit (HOSPITAL_COMMUNITY): Payer: Self-pay

## 2022-02-25 ENCOUNTER — Other Ambulatory Visit: Payer: Self-pay | Admitting: Internal Medicine

## 2022-02-25 DIAGNOSIS — J4520 Mild intermittent asthma, uncomplicated: Secondary | ICD-10-CM

## 2022-03-07 DIAGNOSIS — L821 Other seborrheic keratosis: Secondary | ICD-10-CM | POA: Diagnosis not present

## 2022-03-07 DIAGNOSIS — Z79899 Other long term (current) drug therapy: Secondary | ICD-10-CM | POA: Diagnosis not present

## 2022-03-07 DIAGNOSIS — Z85828 Personal history of other malignant neoplasm of skin: Secondary | ICD-10-CM | POA: Diagnosis not present

## 2022-03-07 DIAGNOSIS — L820 Inflamed seborrheic keratosis: Secondary | ICD-10-CM | POA: Diagnosis not present

## 2022-03-12 LAB — HM DIABETES EYE EXAM

## 2022-03-21 ENCOUNTER — Other Ambulatory Visit (HOSPITAL_COMMUNITY): Payer: Self-pay

## 2022-03-21 ENCOUNTER — Other Ambulatory Visit: Payer: Self-pay | Admitting: Internal Medicine

## 2022-03-21 DIAGNOSIS — I2510 Atherosclerotic heart disease of native coronary artery without angina pectoris: Secondary | ICD-10-CM

## 2022-03-21 DIAGNOSIS — E781 Pure hyperglyceridemia: Secondary | ICD-10-CM

## 2022-03-26 ENCOUNTER — Other Ambulatory Visit (HOSPITAL_COMMUNITY): Payer: Self-pay

## 2022-03-26 ENCOUNTER — Other Ambulatory Visit: Payer: Self-pay | Admitting: Internal Medicine

## 2022-03-26 DIAGNOSIS — E118 Type 2 diabetes mellitus with unspecified complications: Secondary | ICD-10-CM

## 2022-03-26 DIAGNOSIS — I10 Essential (primary) hypertension: Secondary | ICD-10-CM

## 2022-04-08 ENCOUNTER — Other Ambulatory Visit: Payer: Self-pay | Admitting: Internal Medicine

## 2022-04-08 DIAGNOSIS — K219 Gastro-esophageal reflux disease without esophagitis: Secondary | ICD-10-CM

## 2022-04-10 ENCOUNTER — Other Ambulatory Visit: Payer: Self-pay | Admitting: Cardiology

## 2022-04-10 ENCOUNTER — Other Ambulatory Visit: Payer: Self-pay | Admitting: Internal Medicine

## 2022-04-10 DIAGNOSIS — N1831 Chronic kidney disease, stage 3a: Secondary | ICD-10-CM

## 2022-04-10 DIAGNOSIS — E118 Type 2 diabetes mellitus with unspecified complications: Secondary | ICD-10-CM

## 2022-04-29 DIAGNOSIS — H02831 Dermatochalasis of right upper eyelid: Secondary | ICD-10-CM | POA: Diagnosis not present

## 2022-04-29 DIAGNOSIS — H26492 Other secondary cataract, left eye: Secondary | ICD-10-CM | POA: Diagnosis not present

## 2022-04-29 DIAGNOSIS — Z961 Presence of intraocular lens: Secondary | ICD-10-CM | POA: Diagnosis not present

## 2022-04-29 DIAGNOSIS — H18413 Arcus senilis, bilateral: Secondary | ICD-10-CM | POA: Diagnosis not present

## 2022-05-08 DIAGNOSIS — H18593 Other hereditary corneal dystrophies, bilateral: Secondary | ICD-10-CM | POA: Diagnosis not present

## 2022-05-08 DIAGNOSIS — H26492 Other secondary cataract, left eye: Secondary | ICD-10-CM | POA: Diagnosis not present

## 2022-05-14 ENCOUNTER — Other Ambulatory Visit (HOSPITAL_COMMUNITY): Payer: Self-pay

## 2022-05-16 ENCOUNTER — Other Ambulatory Visit (HOSPITAL_COMMUNITY): Payer: Self-pay

## 2022-05-21 ENCOUNTER — Other Ambulatory Visit (HOSPITAL_COMMUNITY): Payer: Self-pay

## 2022-05-23 ENCOUNTER — Other Ambulatory Visit: Payer: Self-pay | Admitting: Internal Medicine

## 2022-05-23 ENCOUNTER — Encounter: Payer: Self-pay | Admitting: Internal Medicine

## 2022-05-23 DIAGNOSIS — E781 Pure hyperglyceridemia: Secondary | ICD-10-CM

## 2022-05-23 DIAGNOSIS — I2510 Atherosclerotic heart disease of native coronary artery without angina pectoris: Secondary | ICD-10-CM

## 2022-05-24 ENCOUNTER — Other Ambulatory Visit: Payer: Self-pay

## 2022-05-24 DIAGNOSIS — J4521 Mild intermittent asthma with (acute) exacerbation: Secondary | ICD-10-CM

## 2022-05-24 MED ORDER — MONTELUKAST SODIUM 10 MG PO TABS: 10.0000 mg | ORAL_TABLET | Freq: Every day | ORAL | 0 refills | Status: DC

## 2022-05-27 ENCOUNTER — Ambulatory Visit: Payer: Medicare PPO | Attending: Cardiology | Admitting: Cardiology

## 2022-05-27 ENCOUNTER — Encounter: Payer: Self-pay | Admitting: Cardiology

## 2022-05-27 VITALS — BP 124/88 | HR 75 | Ht 66.0 in | Wt 184.6 lb

## 2022-05-27 DIAGNOSIS — E785 Hyperlipidemia, unspecified: Secondary | ICD-10-CM

## 2022-05-27 DIAGNOSIS — I255 Ischemic cardiomyopathy: Secondary | ICD-10-CM | POA: Diagnosis not present

## 2022-05-27 DIAGNOSIS — E1169 Type 2 diabetes mellitus with other specified complication: Secondary | ICD-10-CM

## 2022-05-27 DIAGNOSIS — Z9861 Coronary angioplasty status: Secondary | ICD-10-CM | POA: Diagnosis not present

## 2022-05-27 DIAGNOSIS — E781 Pure hyperglyceridemia: Secondary | ICD-10-CM | POA: Diagnosis not present

## 2022-05-27 DIAGNOSIS — I2510 Atherosclerotic heart disease of native coronary artery without angina pectoris: Secondary | ICD-10-CM | POA: Diagnosis not present

## 2022-05-27 DIAGNOSIS — I10 Essential (primary) hypertension: Secondary | ICD-10-CM | POA: Diagnosis not present

## 2022-05-27 NOTE — Progress Notes (Signed)
Primary Care Provider: Janith Lima, [place] Cardiologist: Glenetta Hew, MD Electrophysiologist: None  Clinic Note: Chief Complaint  Patient presents with   Follow-up    Annual follow-up.  Doing well.   Coronary Artery Disease    No angina.    ===================================  ASSESSMENT/PLAN   Problem List Items Addressed This Visit       Cardiology Problems   Ischemic cardiomyopathy (Chronic)    EF improved to 40-45% with PCI. Expectedly there is a large anterior defect with known LAD occlusion with no viability.  Therefore no benefit for CTO PCI.  NYHA class I CHF symptoms not requiring any diuretic.  Basically on low-dose ARB, beta-blocker along with [place].  Not requiring diuretic.  Very active      Relevant Orders   EKG 12-Lead (Completed)   Hypertriglyceridemia (Chronic)    On Vascepa.  Most recent labs have normal triglycerides.      Hyperlipidemia associated with type 2 diabetes mellitus (HCC) (Chronic)    I have not seen labs in a while.  They were checked, but I do not have the LDL and HDL levels from 2023.  Pretty well-controlled previously with same levels total cholesterol and triglycerides, therefore I think were probably okay with current dose of rosuvastatin and Vascepa.  For diabetes, he is on Farxiga monotherapy.  Most recent A1c in July 2023 was 6.3.      Essential hypertension (Chronic)    Not sure if he truly has diagnosis of hypertension, but he seems to be tolerating current dose of irbesartan and Toprol well.  No longer requiring midodrine.  Will hold off on titrating further because of his history of orthostatic hypotension.      Coronary artery disease involving native coronary artery of native heart without angina pectoris - Primary (Chronic)    Extensive PCI to the RCA and RI with occluded LAD fills via collaterals.  Based on the stent, he is on lifelong maintenance clopidogrel.  Not on aspirin because of GI  concerns with Crohn's.  Thankfully Promus has been stable.  Initially titration of GDMT was limited by hypotension most likely orthostatic, but has not required any further midodrine.  Blood pressure has been stable.  Plan:  On long-term Plavix, okay to hold for procedures. Continue with current doses of Toprol and irbesartan (did not tolerate increasing to 75 mg irbesartan-(remains on half tablet/37.5 mg). On stable dose Crestor, tolerating well.  No major issues.  This is in conjunction with the separate for correct). On Farxiga-stating that he has felt better since being on it.  No issues with edema,      Relevant Orders   EKG 12-Lead (Completed)   CAD S/P percutaneous coronary angioplasty (Chronic)    With extensive PCI to the RCA and RI with occluded LAD, plan is for low lifelong clopidogrel.  No longer on DOAC as the LV thrombus appears to have resolved.  Plan: Continue maintenance Plavix and 5 mg daily. Okay to hold Plavix 5 to 7 days preop for surgeries or procedures. Also okay to hold Plavix 3 to 5 days for GI bleeding related to Crohn's.      Relevant Orders   EKG 12-Lead (Completed)   Impression: Stable with no active symptoms.  Doing well on current meds. Plan: No change to medications.  Follow-up in 1 year.  ===================================  HPI:    ALA DESAUTEL is a 72 y.o. male with a notable PMH/CV problem list below who presents today  for annual follow-up at the request of. Janith Lima, MD.  Notable PMH: Acute inferior STEMI 07/13/2018: RCA PCI and staged PCI to RI.  Mid LAD CTO Mild ICM-(EF 40 to 45% with distal septal and apical akinesis.   Initial LV thrombus noted-no longer present as a January 2021 Orthostatic hypotension HLD Crohn's colitis (prior history of significant anemia related to azathioprine)  TORE WALDVOGEL was last seen on 06/19/2021:.  Doing well.  This was the first time as seen in person for 2 years.  He had been started on Farxiga  by his PCP and noticed feeling a whole lot better since starting it.  His energy improved and his weight was down and less issues with edema-also noted weight loss.  Was overall feeling much better.  He had also been started on Vascepa for his hypertriglycerides and doing well.  He is also switched from Humira to [facility] for his Crohn's and was doing much better with that.  Much less skin issues.  Active, walking about a mile or 2 a day.  Also going to the gym doing some conditioning exercises and stretching.  They were contemplating moving to [facility]..  Recent Hospitalizations: None  Reviewed  CV studies:    The following studies were reviewed today: (if available, images/films reviewed: From Epic Chart or Care Everywhere) None:  Interval History:   ASHVIN DOHNER returns here today for routine follow-up doing wonderful.  He is in great spirits.  Very happy.  Still happy to be on Farxiga.  He is very sided with the move to [facility], indicating that he and his wife are very active socially.  They are still keeping up with their outside connections from before, but they are enjoying the socialization being at the retirement community.  They are doing all the activities.  He is taking full advantage of the exercise classes and Pickleball.  He is also doing chair yoga 3 times a week.  The only major concern is that he may be eating a little more desserts than he should because the food is good.  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  Rare dyspnea episodes.  Usually when he uses albuterol that helps..   He has not had to use any midodrine since last visit.  In fact he never got it filled.  CV Review of Symptoms (Summary): Cardiovascular ROS: no chest pain or dyspnea on exertion positive for - RARE off & on SOB - not routine.  negative for - chest pain, edema, irregular heartbeat,  orthopnea, palpitations, paroxysmal nocturnal dyspnea, rapid heart rate, shortness of breath, or lightheadedness/dizziness, syncope/near syncope, TIA/amaurosis fugax, claudication.  REVIEWED OF SYSTEMS   Review of Systems  Constitutional:  Negative for malaise/fatigue and weight loss (hoping to loose).  HENT:  Negative for congestion.   Respiratory:  Negative for cough and shortness of breath (per HPI).   Gastrointestinal:  Negative for blood in stool and melena.  Genitourinary:  Negative for hematuria.  Musculoskeletal:  Positive for joint pain (Just mild aches and pains). Negative for falls and myalgias.  Neurological:  Negative for dizziness, sensory change and focal weakness.  Endo/Heme/Allergies:  Bruises/bleeds easily.  Psychiatric/Behavioral:  Negative for depression and memory loss. The patient is not nervous/anxious.    I have reviewed and (if needed) personally updated the patient's problem list, medications, allergies, past medical and surgical history, social and  family history.   PAST MEDICAL HISTORY   Past Medical History:  Diagnosis Date   Allergic rhinitis, cause unspecified    Anal stenosis    Asthma    Cataract    Chronic orthostatic hypotension    Related to borderline hypotension; intolerant of blood pressure medications.  On Midodrine   Coronary artery disease involving native coronary artery of native heart without angina pectoris 06/2018    p-mRCA 100% (DES PCI - p-mRCA 100% (DES PCI - Resolute Onyx 3.5 x 26 --> ~3.7 mm)), dRCA ~50% & PAV 40%; pRI 85% (Staged DES PCI  Resolute Onyx 2.25 x 15 - ~2.5 mm). 100% LAD CTO after small D1 with ost-prox 60%. R-L collaterals to LAD seen post PCI.   Crohn's colitis ([place])    Exostosis of unspecified site    GERD (gastroesophageal reflux disease)    History of acute inferior wall MI 06/2018   100% p-mRCA -- DES PCI (complicated by mild cardiogenic shock, partially related to 100% LAD CTO) --> staged PCI-RI   Hyperlipidemia  with target LDL less than 70 07/04/2015   Now s/p MI with MV CAD - optimal LDL < 50   Hyperplastic colon polyp 02/26/2008   Hypertension    Ischemic cardiomyopathy    Initial echo shows EF of 40 to 45% with anterior hypokinesis. Cardiac MRI shows EF of 49% with anterior anteroapical hypokinesis/akinesis and LV thrombus.   Lateral epicondylitis  of elbow    Left ventricular thrombus following MI [facility]) ==> resolved as of January 2021 10/10/2018   Cardiac MRI 09/28/2018: Mildly dilated LV.  EF~ 49%.  Mid Anteriopr/Anteroseptal & Apical Anterior HK.  LV APICAL THROMBUS (partially layered) 22 x 17 x 11. ==> b) f/u Echo 03/2019 -No LV Thrombus (NO LONGER ON WARFARIN)   Leukopenia    Macrocytic anemia    Related to azathioprine   Nontraumatic rupture of patellar tendon    Obesity, unspecified    Regional enteritis of unspecified site    Seasonal allergies    Sessile colonic polyp    Vitamin B12 deficiency     PAST SURGICAL HISTORY   Past Surgical History:  Procedure Laterality Date   CARDIAC MRI  09/2018   Mildly dilated LV.  EF estimated roughly 49%.  Hypokinesis of the mid anterior, anteroseptal and apical anterior wall.  There is also apical thrombus (partially layered) 22 x 17 x 11. -->  50-75% late gadolinium enhancement of the mid anterior, anteroseptal and apical anterior and septal walls suggestive of near full-thickness scar w/ minimal viability (poor recovery prognosis w/ REVASCULARIZATION   COLONOSCOPY     2016   CORONARY STENT INTERVENTION N/A 07/24/2018   Procedure: CORONARY STENT INTERVENTION;  Surgeon: Troy Sine, MD;  Location: [place] [facility];  Service: Cardiovascular: Staged PCI pRI 85-90%: Resolute Onyx DES 2.25 x 15 (2.5 mm)   CORONARY/GRAFT ACUTE MI REVASCULARIZATION N/A 07/23/2018   Procedure: CORONARY/GRAFT ACUTE MI REVASCULARIZATION;  Surgeon: [name], MD;  Location: [place] [facility];  Service: Cardiovascular;::  p-mRCA 100% (DES PCI - p-mRCA 100% (DES PCI  - Resolute Onyx 3.5 x 26 (post-dilated ~3.7 mm)), dRCA ~50% & PAV 40%; pRI 85% (Staged PCI). 100% LAD CTO after small D1 with ost-prox 60%. R-L collaterals to LAD seen post PCI.;    LEFT HEART CATH N/A 07/24/2018   Procedure: LEFT HEART CATH;  Surgeon: Troy Sine, MD;  Location: [place] [facility];  Service: Cardiovascular;  Laterality: N/A; 85-90% pRCA,  60% ost D1 then 100% CTO of LAD -- staged DES PCI LAD   LEFT HEART CATH AND CORONARY ANGIOGRAPHY  07/23/2018   Procedure: LEFT HEART CATH;  Surgeon: [name]., MD;  [facility]: [facility]   p-mRCA 100% (DES PCI),dRCA ~50% & PAV 40%; pRI 85% (Staged PCI). 100% LAD CTO after small D1 with ost-prox 60%. R-L collaterals to LAD seen post PCI.;    RIGHT HEART CATH N/A 07/23/2018   Procedure: RIGHT HEART CATH;  Surgeon: [name], MD;  Location: [place] [facility];  Service: Cardiovascular;  Normal RHC #s, CO/CI normal   TONSILLECTOMY AND ADENOIDECTOMY     TRANSTHORACIC ECHOCARDIOGRAM  07/23/2018   Insetting of inferior MI with occluded LAD CTO; --Severe akinesis of the mid anteroseptal wall.  EF 40 and 45%.  GR 1 DD.;;     TRANSTHORACIC ECHOCARDIOGRAM  04/06/2019   To follow-up EF and LV thrombus:  EF 40 and 45%.  Distal septal and apical akinesis.  No LV thrombus noted.  Mild LVH.  GR 1 DD.  Normal RV.  Mild LA dilation.  Normal RA.  Mild aortic valve sclerosis with no stenosis - mild AI   --> warfarin discontinued.  Continued on Plavix alone.    Diagnostic #1: 07/23/2018 Dominance: Right                                  After staged PCI -07/24/2018    MEDICATIONS/ALLERGIES   Current Meds  Medication Sig   acitretin (SORIATANE) 10 MG capsule Take 10 mg by mouth daily.   ADVAIR HFA 115-21 MCG/ACT inhaler INHALE TWO PUFFS TWICE DAILY AS NEEDED (SHAKE WELL AND RINSE MOUTH AFTER)   betamethasone dipropionate 0.05 % cream Apply topically.   cholecalciferol (VITAMIN D3) 25 MCG (1000 UT) tablet Take 1,000 Units by mouth daily.    clopidogrel (PLAVIX) 75 MG tablet TAKE ONE TABLET BY MOUTH ONCE DAILY   Coenzyme Q10 (CO Q 10 PO) Take 100 mg by mouth.   dapagliflozin propanediol (FARXIGA) 10 MG TABS tablet Take 1 tablet (10 mg total) by mouth daily before breakfast.   folic acid (FOLVITE) 1 MG tablet Take 1 mg by mouth daily.   irbesartan (AVAPRO) 75 MG tablet Take 0.5 tablets (37.5 mg total) by mouth daily.   loperamide (IMODIUM A-D) 2 MG tablet Take 2 mg by mouth 4 (four) times daily as needed for diarrhea or loose stools.   metoprolol succinate (TOPROL-XL) 25 MG 24 hr tablet TAKE 1 TABLET ONCE DAILY.   midodrine (PROAMATINE) 2.5 MG tablet TAKE 1 TAB IN AM. MAY TAKE AN ADDITIONAL TAB AFTER 6HR IF STILL DIZZY/SYSTOLIC 0000000   montelukast (SINGULAIR) 10 MG tablet Take 1 tablet (10 mg total) by mouth at bedtime.   Multiple Vitamins-Minerals (CENTRUM SILVER PO) Take 1 tablet by mouth daily.   nitroGLYCERIN (NITROSTAT) 0.4 MG SL tablet 1 TAB UNDER TONGUE AS NEEDED FOR CHEST PAIN. MAY REPEAT EVERY 5 MIN FOR A TOTAL OF 3 DOSES.   pantoprazole (PROTONIX) 40 MG tablet Take 1 tablet (40 mg total) by mouth daily.   prednisoLONE acetate (PRED FORTE) 1 % ophthalmic suspension Place 1 drop into the left eye 4 (four) times daily.   rosuvastatin (CRESTOR) 40 MG tablet TAKE 1 TABLET ONCE DAILY.   traMADol (ULTRAM) 50 MG tablet TAKE ONE TABLET EVERY 6 HOURS AS NEEDED.   ustekinumab (STELARA) 90 MG/ML SOSY injection Inject  Stelara '90mg'$  into skin every 8 weeks starting 06/28/21.   VASCEPA 1 g capsule TAKE TWO CAPSULES BY MOUTH TWICE DAILY   VENTOLIN HFA 108 (90 Base) MCG/ACT inhaler USE 2 PUFFS EVERY 6 HOURS AS NEEDED FOR WHEEZING.   vitamin B-12 (CYANOCOBALAMIN) 1000 MCG tablet Take 1 tablet (1,000 mcg total) by mouth daily.   XDEMVY 0.25 % SOLN Apply 1 drop to eye 2 (two) times daily.   zolpidem (AMBIEN) 10 MG tablet Take 1 tablet (10 mg total) by mouth at bedtime as needed.    Allergies  Allergen Reactions   Allopurinol Other (See  Comments)    Bone marrow suppression   Asacol [Mesalamine] Hives    Trouble swallowing, loss of appetite   Mercaptopurine Other (See Comments)    Bone marrow suppression   Amoxicillin Rash    Did it involve swelling of the face/tongue/throat, SOB, or low BP? Unknown Did it involve sudden or severe rash/hives, skin peeling, or any reaction on the inside of your mouth or nose? Unknown Did you need to seek medical attention at a hospital or doctor's office? Unknown When did it last happen?   b/c of penicillin allergy    If all above answers are "NO", may proceed with cephalosporin use.    Penicillins Rash    Did it involve swelling of the face/tongue/throat, SOB, or low BP? Yes Did it involve sudden or severe rash/hives, skin peeling, or any reaction on the inside of your mouth or nose? No Did you need to seek medical attention at a hospital or doctor's office? No When did it last happen?      more than 15 yrs If all above answers are "NO", may proceed with cephalosporin use.    Sulfamethoxazole Rash    SOCIAL HISTORY/FAMILY HISTORY   Reviewed in Epic:  Pertinent findings:  Social History   Tobacco Use   Smoking status: Never   Smokeless tobacco: Never  Vaping Use   Vaping Use: Never used  Substance Use Topics   Alcohol use: Yes    Alcohol/week: 0.0 standard drinks of alcohol    Comment: rare   Drug use: No   Social History   Social History Narrative   He and his wife recently moved to  [place] to take part in the socialization aspect.  They are enjoying making new friends and being involved in activities.      He is doing chair yoga 3 days a week.  Also playing pickle ball and taking manage of multiple different exercise classes this is in addition to his baseline walking exercise but she now feels safer doing and close neighborhood..      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  This keeps him busy and keeps his  brain working.  Also brings in little extra income.  However, it is keeping him from doing some of his exercises so he is somewhat looking forward to having it done.    OBJCTIVE -PE, EKG, labs   Wt Readings from Last 3 Encounters:  05/27/22 184 lb 9.6 oz (83.7 kg)  09/26/21 181 lb (82.1 kg)  06/19/21 183 lb 3.2 oz (83.1 kg)    Physical Exam: BP 124/88   Pulse 75   Ht '5\' 6"'$  (1.676 m)   Wt 184 lb 9.6 oz (83.7 kg)   SpO2 98%   BMI 29.80 kg/m  Physical Exam Vitals reviewed.  Constitutional:      General: He is  not in acute distress.    Appearance: Normal appearance. He is obese. He is not ill-appearing (Well-nourished, well-groomed.  Healthy-appearing.) or toxic-appearing.  HENT:     Head: Normocephalic and atraumatic.  Neck:     Vascular: No carotid bruit or JVD.  Cardiovascular:     Rate and Rhythm: Normal rate and regular rhythm. No extrasystoles are present.    Pulses: Normal pulses.     Heart sounds: S1 normal and S2 normal. Heart sounds are distant. No murmur heard.    No friction rub. No gallop.  Pulmonary:     Effort: Pulmonary effort is normal. No respiratory distress.     Breath sounds: Normal breath sounds. No wheezing, rhonchi or rales.  Chest:     Chest wall: No tenderness.  Musculoskeletal:        General: Swelling (Trivial ankle) present. Normal range of motion.     Cervical back: Normal range of motion and neck supple.  Skin:    General: Skin is warm and dry.  Neurological:     General: No focal deficit present.     Mental Status: He is alert and oriented to person, place, and time.     Gait: Gait normal.  Psychiatric:        Mood and Affect: Mood normal.        Behavior: Behavior normal.        Thought Content: Thought content normal.        Judgment: Judgment normal.     Adult ECG Report  Rate: 75;  Rhythm: normal sinus rhythm and low voltage.  Cannot exclude septal MI, age-indeterminate.  Otherwise normal axis, intervals and durations. ;    Narrative Interpretation: Stable  Recent Labs:  reviewed -- did have lipids @ PCP in Dec 03/07/2022: TC 157, TG 133-stable compared to last year.  I suspect that his HDL and LDL are stable. Lab Results  Component Value Date   CHOL 154 04/05/2021   HDL 58.20 04/05/2021   LDLCALC 63 11/08/2019   LDLDIRECT 72.0 04/05/2021   TRIG 213.0 (H) 04/05/2021   CHOLHDL 3 04/05/2021   Lab Results  Component Value Date   CREATININE 1.07 09/26/2021   BUN 17 09/26/2021   NA 138 09/26/2021   K 4.2 09/26/2021   CL 104 09/26/2021   CO2 24 09/26/2021      Latest Ref Rng & Units 09/26/2021   11:48 AM 09/06/2020   10:35 AM 06/14/2020    9:36 AM  CBC  WBC 4.0 - 10.5 K/uL 10.7  7.0  6.9   Hemoglobin 13.0 - 17.0 g/dL 15.3  15.4  15.6   Hematocrit 39.0 - 52.0 % 45.4  44.5  44.7   Platelets 150.0 - 400.0 K/uL 267.0  266.0  259.0     Lab Results  Component Value Date   HGBA1C 6.3 09/26/2021   Lab Results  Component Value Date   TSH 1.16 04/05/2021    ================================================== I spent a total of 26 minutes with the patient spent in direct patient consultation.  Additional time spent with chart review  / charting (studies, outside notes, etc): 15 min Total Time: 41 min  Current medicines are reviewed at length with the patient today.  (+/- concerns) none  Notice: This dictation was prepared with Dragon dictation along with smart phrase technology. Any transcriptional errors that result from this process are unintentional and may not be corrected upon review.  Studies Ordered:   Orders Placed This Encounter  Procedures  EKG 12-Lead   No orders of the defined types were placed in this encounter.   Patient Instructions / Medication Changes & Studies & Tests Ordered   Patient Instructions  Medication Instructions:  No changes   *If you need a refill on your cardiac medications before your next appointment, please call your pharmacy*   Lab Work: No  changes     Testing/Procedures: Not needed   Follow-Up: At [facility], you and your health needs are our priority.  As part of our continuing mission to provide you with exceptional heart care, we have created designated Provider Care Teams.  These Care Teams include your primary Cardiologist (physician) and Advanced Practice Providers (APPs -  Physician Assistants and Nurse Practitioners) who all work together to provide you with the care you need, when you need it.     Your next appointment:   12 month(s)  The format for your next appointment:   In Person  Provider:   Glenetta Hew, MD         [name], MD, MS Glenetta Hew, M.D., M.S. Interventional Cardiologist  [place]  Pager # [phone] Phone # [phone] [address] [place], [place] [address]   Thank you for choosing [facility] [place]!!

## 2022-05-27 NOTE — Patient Instructions (Signed)
Medication Instructions:  No changes   *If you need a refill on your cardiac medications before your next appointment, please call your pharmacy*   Lab Work: No changes     Testing/Procedures: Not needed   Follow-Up: At [facility], you and your health needs are our priority.  As part of our continuing mission to provide you with exceptional heart care, we have created designated Provider Care Teams.  These Care Teams include your primary Cardiologist (physician) and Advanced Practice Providers (APPs -  Physician Assistants and Nurse Practitioners) who all work together to provide you with the care you need, when you need it.     Your next appointment:   12 month(s)  The format for your next appointment:   In Person  Provider:   Glenetta Hew, MD

## 2022-05-29 DIAGNOSIS — H26491 Other secondary cataract, right eye: Secondary | ICD-10-CM | POA: Diagnosis not present

## 2022-06-04 ENCOUNTER — Encounter: Payer: Self-pay | Admitting: Cardiology

## 2022-06-04 NOTE — Assessment & Plan Note (Signed)
On Vascepa.  Most recent labs have normal triglycerides.

## 2022-06-04 NOTE — Assessment & Plan Note (Signed)
Extensive PCI to the RCA and RI with occluded LAD fills via collaterals.  Based on the stent, he is on lifelong maintenance clopidogrel.  Not on aspirin because of GI concerns with Crohn's.  Thankfully Promus has been stable.  Initially titration of GDMT was limited by hypotension most likely orthostatic, but has not required any further midodrine.  Blood pressure has been stable.  Plan:  On long-term Plavix, okay to hold for procedures. Continue with current doses of Toprol and irbesartan (did not tolerate increasing to 75 mg irbesartan-(remains on half tablet/37.5 mg). On stable dose Crestor, tolerating well.  No major issues.  This is in conjunction with the separate for correct). On Farxiga-stating that he has felt better since being on it.  No issues with edema,

## 2022-06-04 NOTE — Assessment & Plan Note (Signed)
I have not seen labs in a while.  They were checked, but I do not have the LDL and HDL levels from 2023.  Pretty well-controlled previously with same levels total cholesterol and triglycerides, therefore I think were probably okay with current dose of rosuvastatin and Vascepa.  For diabetes, he is on Farxiga monotherapy.  Most recent A1c in July 2023 was 6.3.

## 2022-06-04 NOTE — Assessment & Plan Note (Signed)
With extensive PCI to the RCA and RI with occluded LAD, plan is for low lifelong clopidogrel.  No longer on DOAC as the LV thrombus appears to have resolved.  Plan: Continue maintenance Plavix and 5 mg daily. Okay to hold Plavix 5 to 7 days preop for surgeries or procedures. Also okay to hold Plavix 3 to 5 days for GI bleeding related to Crohn's.

## 2022-06-04 NOTE — Assessment & Plan Note (Signed)
EF improved to 40-45% with PCI. Expectedly there is a large anterior defect with known LAD occlusion with no viability.  Therefore no benefit for CTO PCI.  NYHA class I CHF symptoms not requiring any diuretic.  Basically on low-dose ARB, beta-blocker along with [place].  Not requiring diuretic.  Very active

## 2022-06-04 NOTE — Assessment & Plan Note (Signed)
Not sure if he truly has diagnosis of hypertension, but he seems to be tolerating current dose of irbesartan and Toprol well.  No longer requiring midodrine.  Will hold off on titrating further because of his history of orthostatic hypotension.

## 2022-06-12 DIAGNOSIS — L570 Actinic keratosis: Secondary | ICD-10-CM | POA: Diagnosis not present

## 2022-06-12 DIAGNOSIS — Z85828 Personal history of other malignant neoplasm of skin: Secondary | ICD-10-CM | POA: Diagnosis not present

## 2022-06-12 DIAGNOSIS — L308 Other specified dermatitis: Secondary | ICD-10-CM | POA: Diagnosis not present

## 2022-06-12 DIAGNOSIS — L858 Other specified epidermal thickening: Secondary | ICD-10-CM | POA: Diagnosis not present

## 2022-06-25 ENCOUNTER — Other Ambulatory Visit: Payer: Self-pay | Admitting: Cardiology

## 2022-06-25 DIAGNOSIS — E785 Hyperlipidemia, unspecified: Secondary | ICD-10-CM

## 2022-07-01 ENCOUNTER — Encounter: Payer: Self-pay | Admitting: Student in an Organized Health Care Education/Training Program

## 2022-07-01 ENCOUNTER — Ambulatory Visit: Payer: Medicare PPO | Admitting: Student in an Organized Health Care Education/Training Program

## 2022-07-01 VITALS — BP 121/65 | HR 79 | Temp 97.5°F | Ht 66.0 in | Wt 187.3 lb

## 2022-07-01 DIAGNOSIS — I129 Hypertensive chronic kidney disease with stage 1 through stage 4 chronic kidney disease, or unspecified chronic kidney disease: Secondary | ICD-10-CM

## 2022-07-01 DIAGNOSIS — E785 Hyperlipidemia, unspecified: Secondary | ICD-10-CM | POA: Diagnosis not present

## 2022-07-01 DIAGNOSIS — K219 Gastro-esophageal reflux disease without esophagitis: Secondary | ICD-10-CM | POA: Diagnosis not present

## 2022-07-01 DIAGNOSIS — E1122 Type 2 diabetes mellitus with diabetic chronic kidney disease: Secondary | ICD-10-CM | POA: Diagnosis not present

## 2022-07-01 DIAGNOSIS — Z79899 Other long term (current) drug therapy: Secondary | ICD-10-CM | POA: Diagnosis not present

## 2022-07-01 DIAGNOSIS — D510 Vitamin B12 deficiency anemia due to intrinsic factor deficiency: Secondary | ICD-10-CM

## 2022-07-01 DIAGNOSIS — Z7984 Long term (current) use of oral hypoglycemic drugs: Secondary | ICD-10-CM

## 2022-07-01 DIAGNOSIS — J4521 Mild intermittent asthma with (acute) exacerbation: Secondary | ICD-10-CM

## 2022-07-01 DIAGNOSIS — E559 Vitamin D deficiency, unspecified: Secondary | ICD-10-CM | POA: Diagnosis not present

## 2022-07-01 DIAGNOSIS — N1831 Chronic kidney disease, stage 3a: Secondary | ICD-10-CM

## 2022-07-01 DIAGNOSIS — I2510 Atherosclerotic heart disease of native coronary artery without angina pectoris: Secondary | ICD-10-CM | POA: Diagnosis not present

## 2022-07-01 DIAGNOSIS — J4520 Mild intermittent asthma, uncomplicated: Secondary | ICD-10-CM

## 2022-07-01 DIAGNOSIS — I10 Essential (primary) hypertension: Secondary | ICD-10-CM

## 2022-07-01 DIAGNOSIS — I255 Ischemic cardiomyopathy: Secondary | ICD-10-CM | POA: Diagnosis not present

## 2022-07-01 DIAGNOSIS — E118 Type 2 diabetes mellitus with unspecified complications: Secondary | ICD-10-CM

## 2022-07-01 DIAGNOSIS — E1169 Type 2 diabetes mellitus with other specified complication: Secondary | ICD-10-CM

## 2022-07-01 LAB — POCT GLYCOSYLATED HEMOGLOBIN (HGB A1C): Hemoglobin A1C: 6.6 % — AB (ref 4.0–5.6)

## 2022-07-01 LAB — GLUCOSE, CAPILLARY: Glucose-Capillary: 125 mg/dL — ABNORMAL HIGH (ref 70–99)

## 2022-07-01 MED ORDER — PANTOPRAZOLE SODIUM 40 MG PO TBEC: 40.0000 mg | DELAYED_RELEASE_TABLET | Freq: Every day | ORAL | 3 refills | Status: DC

## 2022-07-01 MED ORDER — DAPAGLIFLOZIN PROPANEDIOL 10 MG PO TABS: 10.0000 mg | ORAL_TABLET | Freq: Every day | ORAL | 3 refills | Status: DC

## 2022-07-01 MED ORDER — FLUTICASONE-SALMETEROL 115-21 MCG/ACT IN AERO
2.0000 | INHALATION_SPRAY | Freq: Two times a day (BID) | RESPIRATORY_TRACT | 1 refills | Status: DC
Start: 2022-07-01 — End: 2022-11-20

## 2022-07-01 MED ORDER — MONTELUKAST SODIUM 10 MG PO TABS: 10.0000 mg | ORAL_TABLET | Freq: Every day | ORAL | 3 refills | Status: DC

## 2022-07-01 MED ORDER — IRBESARTAN 75 MG PO TABS: 37.5000 mg | ORAL_TABLET | Freq: Every day | ORAL | 3 refills | Status: DC

## 2022-07-01 NOTE — Assessment & Plan Note (Addendum)
Pt reports he is unsure about having a history of hypertension. Blood pressure today was within goal at 121/65. He currently takes irbesartan and Toprol due to history of ischemic cardiomyopathy. No indication to add antihypertensive at this time. Will continue with current treatment regimen.   > Toprol 25 mg daily > irbesartan 75 mg daily

## 2022-07-01 NOTE — Assessment & Plan Note (Signed)
Pt with history of B12 deficiency anemia. Last B12 was 853 1 year ago. Will obtain B12 today and follow up.  > B12

## 2022-07-01 NOTE — Assessment & Plan Note (Signed)
Pt reports history of HLD. Records reveal last lipid panel was in January 2023, at revealed TGL 213, LDL 72, and HDL 58.2. He is currently taking Crestor 40 mg and Vascepa 1 g daily. Will obtain lipid panel today and follow up.  > Crestor 40 mg > Vascepa 1 g

## 2022-07-01 NOTE — Assessment & Plan Note (Signed)
Pt reports history of CAD with MI in April of 2022. Was found to have three-vessel disease with PCI to the RCA and RI with known occlusion of the LAD. He is followed by Dr. Bryan Lemma in cardiology. Currently takes Plavix, Toprol and irbesartan, Crestor, and [place]. He denies any recent chest pain, shortness of breath, or syncopal events. States he is able to tolerate walking long distances without difficulty. Walk most days of the week and would like to start using the fitness facility as his retirement community 3 times a week. Overall, appears well controlled. Will continue with current treatment regimen.   > Plavix 75 mg > Farxiga 10 mg > irbesartan 75 mg > Troprol 25 mg >Crestor 40 mg

## 2022-07-01 NOTE — Assessment & Plan Note (Addendum)
Pt reports longstanding history of asthma since childhood. Denies any recent attacks or SOB. Lung sound were CTA without wheezed bilaterally on exam. He is currently taking montelukast and advair.  He rarely uses Ventolin or reliever therapy.  We talked about new evidence for anti-inflammatory reliever therapy with the same Advair HFA.  Will plan to continue with Singulair daily, Advair twice daily and additional as needed Advair for reliever therapy.

## 2022-07-01 NOTE — Assessment & Plan Note (Addendum)
Chronic and stable.  Well-controlled with A1c of 6.3%.  Plan to continue with Farxiga 10 mg daily which I refilled.  He has ischemic heart disease, no other signs of diabetes related complications.

## 2022-07-01 NOTE — Assessment & Plan Note (Signed)
Pt currently prescribed acitretin 10 mg by Dr. Donzetta Starch as [facility] associated regarding some skin irritation of his lower extremities. States that Dr. Yetta Barre is requesting HFP, CBC, and lipid panel as he takes this medication. Will obtain these labs today.  > HFP > CBC > Lipid panel

## 2022-07-01 NOTE — Assessment & Plan Note (Signed)
Pt with history of vitamin D deficiency. Last check was 2 years ago with vitamin D within normal range. Will obtain labs today.  > Vitamin D lab

## 2022-07-01 NOTE — Patient Instructions (Addendum)
Today we discussed the following:  1) Ischemic cardiomyopathy Please continue taking your Plavix, Toprol, irbesartan, Crestor, and Farxiga as prescribed.  2) Hyperlipidemia We have obtained blood work today.Please continue taking your Crestor and Vascepa as prescribed.   3) Asthma We have refilled your medications. Please continue taking montelukast, Advair, and Ventolin as prescribed.   4) Basic labs We have obtained your liver function panel, CBC, and lipid panel for your dermatologist.   Please follow up in 6 months. You may call with any questions or concerns in the meantime.

## 2022-07-01 NOTE — Progress Notes (Signed)
Attestation for Student Documentation:  I personally was present and performed or re-performed the history, physical exam and medical decision-making activities of this service and have verified that the service and findings are accurately documented in the student's note.  72 year old person living with chronic disease and ischemic heart disease new to our clinic to establish primary care services.  Medically patient is doing well, he has no acute concerns today.  He has a number of chronic conditions being managed by subspecialists in [place].  He is a retired Airline pilot, still does part-time work which she enjoys, he lives with his [facility] at [facility] for the last 1 year.  He greatly enjoyed his time at the [facility], remains very social and active.  He is living with Crohn's disease which is being managed by Dr. Rhea Belton on biologic therapy.  He has not had any recent flares.  He is living with ischemic heart disease, being managed by Dr. Herbie Baltimore.  Last ischemic event was in 2020, he has had PCI and is currently on medical management with Plavix, rosuvastatin, metoprolol, and irbesartan.  He has mildly reduced ejection fraction, well compensated today with NYHA class I symptoms, this is being treated with [place].  He has mild type 2 diabetes with no other complications being managed with [place] as well.  He has mild hypertension which is currently well-managed with irbesartan low-dose, he has had some issues in the past with orthostatic hypotension.  He has well-controlled asthma using Advair twice daily and Singulair.  Lastly he has some dermatologic conditions including a nonspecific dermatitis and pretty extensive solar damage, this is being managed by Dr. Yetta Barre with dermatology with oral acitretin.  At 72 years old he is totally independent, enjoying excellent quality of life and has clearly been well-managed to this point.  I have sent refills for Farxiga, irbesartan,  Singulair, and Advair.  We will check labs today including lipids, CMP, CBC given the use of an oral retinoid.   Darryl Alias, MD 07/01/2022, 1:45 PM

## 2022-07-01 NOTE — Assessment & Plan Note (Signed)
Pt with history of ischemic cardiomyopathy currently being followed by Dr. Bryan Lemma in cardiology. Per chart review, last EF improved to 40-45% with PCI. Currently NYHA class I CHF symptoms and does not require diuretics. Currently on irbesartan, Toprol, and [place]. Will continue with currently treatment regimen.   > irbesartan 75 mg > Toprol 25 mg > Farxiga 10 mg

## 2022-07-01 NOTE — Progress Notes (Addendum)
Subjective:   Patient ID: Darryl [name] male   DOB: Nov 20, 1950 72 y.o.   MRN: [id number]  HPI: [name] is a 72 y.o. male with a PMHx as listed below who presents to establish care. Please see problem based assessment and plan for further work up and management.   Patient Active Problem List   Diagnosis Date Noted   Type II diabetes mellitus with manifestations 10/01/2018    Priority: High   Coronary artery disease involving native coronary artery of native heart without angina pectoris     Priority: High   CAD S/P percutaneous coronary angioplasty 07/23/2018    Priority: High   Ischemic cardiomyopathy 07/23/2018    Priority: High   Crohn's disease 04/27/2009    Priority: High   Hyperlipidemia associated with type 2 diabetes mellitus 07/04/2015    Priority: Medium    Essential hypertension 12/01/2013    Priority: Medium    Vitamin B12 deficiency anemia due to intrinsic factor deficiency 10/05/2018    Priority: Low   BPH associated with nocturia 07/04/2015    Priority: Low   Encounter for long-term current use of high risk medication 05/19/2013    Priority: Low   GERD 02/08/2008    Priority: Low   Asthma 05/22/2006    Priority: Low   Vitamin D deficiency 07/01/2022     Current Outpatient Medications  Medication Sig Dispense Refill   acitretin (SORIATANE) 10 MG capsule Take 10 mg by mouth daily.     clopidogrel (PLAVIX) 75 MG tablet TAKE ONE TABLET BY MOUTH ONCE DAILY 90 tablet 3   dapagliflozin propanediol (FARXIGA) 10 MG TABS tablet Take 1 tablet (10 mg total) by mouth daily before breakfast. 90 tablet 3   fluticasone-salmeterol (ADVAIR HFA) 115-21 MCG/ACT inhaler Inhale 2 puffs into the lungs 2 (two) times daily. 12 g 1   irbesartan (AVAPRO) 75 MG tablet Take 0.5 tablets (37.5 mg total) by mouth daily. 45 tablet 3   metoprolol succinate (TOPROL-XL) 25 MG 24 hr tablet TAKE 1 TABLET ONCE DAILY. 90 tablet 3   montelukast (SINGULAIR) 10 MG tablet Take 1  tablet (10 mg total) by mouth at bedtime. 90 tablet 3   Multiple Vitamins-Minerals (CENTRUM SILVER PO) Take 1 tablet by mouth daily.     nitroGLYCERIN (NITROSTAT) 0.4 MG SL tablet 1 TAB UNDER TONGUE AS NEEDED FOR CHEST PAIN. MAY REPEAT EVERY 5 MIN FOR A TOTAL OF 3 DOSES. 25 tablet 3   pantoprazole (PROTONIX) 40 MG tablet Take 1 tablet (40 mg total) by mouth daily. 90 tablet 3   prednisoLONE acetate (PRED FORTE) 1 % ophthalmic suspension Place 1 drop into the left eye 4 (four) times daily.     rosuvastatin (CRESTOR) 40 MG tablet TAKE 1 TABLET ONCE DAILY. 90 tablet 3   ustekinumab (STELARA) 90 MG/ML SOSY injection Inject Stelara 90mg  into skin every 8 weeks starting 06/28/21. 1 mL 11   VASCEPA 1 g capsule TAKE TWO CAPSULES BY MOUTH TWICE DAILY 360 capsule 1   VENTOLIN HFA 108 (90 Base) MCG/ACT inhaler USE 2 PUFFS EVERY 6 HOURS AS NEEDED FOR WHEEZING. 18 g 1   vitamin B-12 (CYANOCOBALAMIN) 1000 MCG tablet Take 1 tablet (1,000 mcg total) by mouth daily.     XDEMVY 0.25 % SOLN Apply 1 drop to eye 2 (two) times daily.     No current facility-administered medications for this visit.   Family History  Problem Relation Age of Onset   Diabetes Brother  Diabetes Father    Heart attack Father    Hypertension Father    Heart disease Father    Colon cancer Neg Hx    Stomach cancer Neg Hx    Hyperlipidemia Neg Hx    Sudden death Neg Hx    Cancer Neg Hx    COPD Neg Hx    Alcohol abuse Neg Hx    Drug abuse Neg Hx    Stroke Neg Hx    Rectal cancer Neg Hx    Esophageal cancer Neg Hx     Social History   Socioeconomic History   Marital status: Married    Spouse name: Not on file   Number of children: Not on file   Years of education: Not on file   Highest education level: Not on file  Occupational History   Occupation: accountant  Tobacco Use   Smoking status: Never   Smokeless tobacco: Never  Vaping Use   Vaping Use: Never used  Substance and Sexual Activity   Alcohol use: Yes     Alcohol/week: 0.0 standard drinks of alcohol    Comment: rare   Drug use: No   Sexual activity: Yes  Other Topics Concern   Not on file  Social History Narrative   He and his wife recently moved to  [place] to take part in the socialization aspect.  They are enjoying making new friends and being involved in activities.      He is doing chair yoga 3 days a week.  Also playing pickle ball and taking manage of multiple different exercise classes this is in addition to his baseline walking exercise but she now feels safer doing and close neighborhood..      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  This keeps him busy and keeps his brain working.  Also brings in little extra income.  However, it is keeping him from doing some of his exercises so he is somewhat looking forward to having it done.   Social Determinants of Health   Financial Resource Strain: Low Risk  (11/02/2021)   Overall Financial Resource Strain (CARDIA)    Difficulty of Paying Living Expenses: Not hard at all  Food Insecurity: No Food Insecurity (11/02/2021)   Hunger Vital Sign    Worried About Running Out of Food in the Last Year: Never true    Ran Out of Food in the Last Year: Never true  Transportation Needs: No Transportation Needs (11/02/2021)   PRAPARE - Administrator, Civil Service (Medical): No    Lack of Transportation (Non-Medical): No  Physical Activity: Sufficiently Active (11/02/2021)   Exercise Vital Sign    Days of Exercise per Week: 5 days    Minutes of Exercise per Session: 30 min  Stress: No Stress Concern Present (11/02/2021)   [employer] of Occupational Health - Occupational Stress Questionnaire    Feeling of Stress : Not at all  Social Connections: Socially Integrated (11/02/2021)   Social Connection and Isolation Panel [NHANES]    Frequency of Communication with Friends and Family: Three times a week    Frequency of Social  Gatherings with Friends and Family: Three times a week    Attends Religious Services: More than 4 times per year    Active Member of Clubs or Organizations: Yes    Attends Banker Meetings: 1 to 4 times per year    Marital Status: Married  Catering manager Violence:  Not At Risk (11/02/2021)   Humiliation, Afraid, Rape, and Kick questionnaire    Fear of Current or Ex-Partner: No    Emotionally Abused: No    Physically Abused: No    Sexually Abused: No    Review of Systems: Pertinent ROS present in the A&P. Otherwise negative.   Objective:   Physical Exam: Vitals:   07/01/22 0833  BP: 121/65  Pulse: 79  Temp: (!) 97.5 F (36.4 C)  TempSrc: Oral  SpO2: 98%  Weight: 187 lb 4.8 oz (85 kg)  Height: 5\' 6"  (1.676 m)   Constitutional:      General: He is not in acute distress. HENT:     Head: Normocephalic and atraumatic.  Neck:     Supple. Trachea midline Cardiovascular:     Rate and Rhythm: Normal rate and regular rhythm.    Pulses: Normal pulses.     Heart sounds: S1 normal and S2 normal. No murmur heard.    No friction rub. No gallop.  Pulmonary:     Effort: Pulmonary effort is normal. No respiratory distress.     Breath sounds: Normal breath sounds. No wheezing, rhonchi or rales.  Musculoskeletal:        General: Normal range of motion.  Skin:    General: Skin is warm and dry. Dry, scaling skin to the dorsal aspect of the hands bilaterally. Neurological:     General: No focal deficit present.     Mental Status: He is alert and oriented to person, place, and time.  Psychiatric:        Appropriate mood and affect.   Assessment & Plan:   Essential hypertension Pt reports he is unsure about having a history of hypertension. Blood pressure today was within goal at 121/65. He currently takes irbesartan and Toprol due to history of ischemic cardiomyopathy. No indication to add antihypertensive at this time. Will continue with current treatment regimen.   >  Toprol 25 mg daily > irbesartan 75 mg daily   Asthma Pt reports longstanding history of asthma since childhood. Denies any recent attacks or SOB. Lung sound were CTA without wheezed bilaterally on exam. He is currently taking montelukast and advair.  He rarely uses Ventolin or reliever therapy.  We talked about new evidence for anti-inflammatory reliever therapy with the same Advair HFA.  Will plan to continue with Singulair daily, Advair twice daily and additional as needed Advair for reliever therapy.   Hyperlipidemia associated with type 2 diabetes mellitus (HCC) Pt reports history of HLD. Records reveal last lipid panel was in January 2023, at revealed TGL 213, LDL 72, and HDL 58.2. He is currently taking Crestor 40 mg and Vascepa 1 g daily. Will obtain lipid panel today and follow up.  > Crestor 40 mg > Vascepa 1 g     Type II diabetes mellitus with manifestations Chronic and stable.  Well-controlled with A1c of 6.3%.  Plan to continue with Farxiga 10 mg daily which I refilled.  He has ischemic heart disease, no other signs of diabetes related complications.  Encounter for long-term current use of high risk medication Pt currently prescribed acitretin 10 mg by Dr. Donzetta Starch as [facility] associated regarding some skin irritation of his lower extremities. States that Dr. Yetta Barre is requesting HFP, CBC, and lipid panel as he takes this medication. Will obtain these labs today.  > HFP > CBC > Lipid panel  Vitamin B12 deficiency anemia due to intrinsic factor deficiency Pt with history of B12 deficiency  anemia. Last B12 was 853 1 year ago. Will obtain B12 today and follow up.  > B12  Vitamin D deficiency Pt with history of vitamin D deficiency. Last check was 2 years ago with vitamin D within normal range. Will obtain labs today.  > Vitamin D lab   Coronary artery disease involving native coronary artery of native heart without angina pectoris Pt reports history of CAD  with MI in April of 2022. Was found to have three-vessel disease with PCI to the RCA and RI with known occlusion of the LAD. He is followed by Dr. Bryan Lemma in cardiology. Currently takes Plavix, Toprol and irbesartan, Crestor, and [place]. He denies any recent chest pain, shortness of breath, or syncopal events. States he is able to tolerate walking long distances without difficulty. Walk most days of the week and would like to start using the fitness facility as his retirement community 3 times a week. Overall, appears well controlled. Will continue with current treatment regimen.   > Plavix 75 mg > Farxiga 10 mg > irbesartan 75 mg > Troprol 25 mg >Crestor 40 mg   Ischemic cardiomyopathy Pt with history of ischemic cardiomyopathy currently being followed by Dr. Bryan Lemma in cardiology. Per chart review, last EF improved to 40-45% with PCI. Currently NYHA class I CHF symptoms and does not require diuretics. Currently on irbesartan, Toprol, and [place]. Will continue with currently treatment regimen.   > irbesartan 75 mg > Toprol 25 mg > Farxiga 10 mg

## 2022-07-02 LAB — CBC
Hematocrit: 48.1 % (ref 37.5–51.0)
Hemoglobin: 16.6 g/dL (ref 13.0–17.7)
MCH: 32.4 pg (ref 26.6–33.0)
MCHC: 34.5 g/dL (ref 31.5–35.7)
MCV: 94 fL (ref 79–97)
Platelets: 275 10*3/uL (ref 150–450)
RBC: 5.12 x10E6/uL (ref 4.14–5.80)
RDW: 13 % (ref 11.6–15.4)
WBC: 6.2 10*3/uL (ref 3.4–10.8)

## 2022-07-02 LAB — VITAMIN B12: Vitamin B-12: 727 pg/mL (ref 232–1245)

## 2022-07-02 LAB — LIPID PANEL
Chol/HDL Ratio: 2.8 ratio (ref 0.0–5.0)
Cholesterol, Total: 165 mg/dL (ref 100–199)
HDL: 58 mg/dL (ref >39)
LDL Chol Calc (NIH): 73 mg/dL (ref 0–99)
Triglycerides: 207 mg/dL — ABNORMAL HIGH (ref 0–149)
VLDL Cholesterol Cal: 34 mg/dL (ref 5–40)

## 2022-07-02 LAB — CMP14 + ANION GAP
ALT: 36 IU/L (ref 0–44)
AST: 31 IU/L (ref 0–40)
Albumin/Globulin Ratio: 2 (ref 1.2–2.2)
Albumin: 4.5 g/dL (ref 3.8–4.8)
Alkaline Phosphatase: 84 IU/L (ref 44–121)
Anion Gap: 15 mmol/L (ref 10.0–18.0)
BUN/Creatinine Ratio: 16 (ref 10–24)
BUN: 14 mg/dL (ref 8–27)
Bilirubin Total: 0.4 mg/dL (ref 0.0–1.2)
CO2: 20 mmol/L (ref 20–29)
Calcium: 10 mg/dL (ref 8.6–10.2)
Chloride: 106 mmol/L (ref 96–106)
Creatinine, Ser: 0.9 mg/dL (ref 0.76–1.27)
Globulin, Total: 2.3 g/dL (ref 1.5–4.5)
Glucose: 121 mg/dL — ABNORMAL HIGH (ref 70–99)
Potassium: 4.3 mmol/L (ref 3.5–5.2)
Sodium: 141 mmol/L (ref 134–144)
Total Protein: 6.8 g/dL (ref 6.0–8.5)
eGFR: 91 mL/min/{1.73_m2} (ref >59)

## 2022-07-02 LAB — VITAMIN D 25 HYDROXY (VIT D DEFICIENCY, FRACTURES): Vit D, 25-Hydroxy: 23.1 ng/mL — ABNORMAL LOW (ref 30.0–100.0)

## 2022-07-09 ENCOUNTER — Other Ambulatory Visit: Payer: Self-pay | Admitting: Internal Medicine

## 2022-07-09 ENCOUNTER — Other Ambulatory Visit (HOSPITAL_COMMUNITY): Payer: Self-pay

## 2022-07-10 ENCOUNTER — Other Ambulatory Visit (HOSPITAL_COMMUNITY): Payer: Self-pay

## 2022-07-10 ENCOUNTER — Other Ambulatory Visit: Payer: Self-pay

## 2022-07-10 ENCOUNTER — Ambulatory Visit: Payer: Medicare PPO | Admitting: Internal Medicine

## 2022-07-10 MED ORDER — STELARA 90 MG/ML ~~LOC~~ SOSY
PREFILLED_SYRINGE | SUBCUTANEOUS | 11 refills | Status: DC
  Filled 2022-07-10 (×2): qty 1, 56d supply, fill #0
  Filled 2022-09-03: qty 1, 56d supply, fill #1
  Filled 2022-10-25: qty 1, 56d supply, fill #2

## 2022-07-11 DIAGNOSIS — H18502 Unspecified hereditary corneal dystrophies, left eye: Secondary | ICD-10-CM | POA: Diagnosis not present

## 2022-07-18 DIAGNOSIS — H01009 Unspecified blepharitis unspecified eye, unspecified eyelid: Secondary | ICD-10-CM | POA: Diagnosis not present

## 2022-07-19 ENCOUNTER — Other Ambulatory Visit (HOSPITAL_COMMUNITY): Payer: Self-pay

## 2022-07-25 ENCOUNTER — Other Ambulatory Visit (HOSPITAL_COMMUNITY): Payer: Self-pay

## 2022-08-09 ENCOUNTER — Other Ambulatory Visit (HOSPITAL_COMMUNITY): Payer: Self-pay

## 2022-09-03 ENCOUNTER — Other Ambulatory Visit (HOSPITAL_COMMUNITY): Payer: Self-pay

## 2022-09-03 ENCOUNTER — Other Ambulatory Visit: Payer: Self-pay

## 2022-09-04 ENCOUNTER — Other Ambulatory Visit: Payer: Self-pay

## 2022-09-05 DIAGNOSIS — H04123 Dry eye syndrome of bilateral lacrimal glands: Secondary | ICD-10-CM | POA: Diagnosis not present

## 2022-09-05 DIAGNOSIS — H18421 Band keratopathy, right eye: Secondary | ICD-10-CM | POA: Diagnosis not present

## 2022-09-11 ENCOUNTER — Encounter: Payer: Self-pay | Admitting: Internal Medicine

## 2022-09-11 ENCOUNTER — Other Ambulatory Visit: Payer: Medicare PPO

## 2022-09-11 ENCOUNTER — Ambulatory Visit: Payer: Medicare PPO | Admitting: Internal Medicine

## 2022-09-11 ENCOUNTER — Telehealth: Payer: Self-pay

## 2022-09-11 VITALS — BP 114/68 | HR 72 | Ht 67.0 in | Wt 184.0 lb

## 2022-09-11 DIAGNOSIS — K5010 Crohn's disease of large intestine without complications: Secondary | ICD-10-CM | POA: Diagnosis not present

## 2022-09-11 DIAGNOSIS — K219 Gastro-esophageal reflux disease without esophagitis: Secondary | ICD-10-CM | POA: Diagnosis not present

## 2022-09-11 DIAGNOSIS — Z7902 Long term (current) use of antithrombotics/antiplatelets: Secondary | ICD-10-CM

## 2022-09-11 DIAGNOSIS — Z86010 Personal history of colonic polyps: Secondary | ICD-10-CM | POA: Diagnosis not present

## 2022-09-11 DIAGNOSIS — E538 Deficiency of other specified B group vitamins: Secondary | ICD-10-CM | POA: Diagnosis not present

## 2022-09-11 MED ORDER — SUTAB 1479-225-188 MG PO TABS: 24.0000 | ORAL_TABLET | ORAL | 0 refills | Status: DC

## 2022-09-11 NOTE — Telephone Encounter (Signed)
[facility] Pre-operative Risk Assessment     Request for surgical clearance:     Endoscopy Procedure  What type of surgery is being performed?     colonoscopy  When is this surgery scheduled?     11/29/22  What type of clearance is required ?   Pharmacy  Are there any medications that need to be held prior to surgery and how long? Plavix x 5 days  Practice name and name of physician performing surgery?      [facility]  What is your office phone and fax number?      Phone- [phone]  Fax- [phone]  Anesthesia type (None, local, MAC, general) ?       MAC

## 2022-09-11 NOTE — Telephone Encounter (Signed)
Informed patient that he can hold Plavix 5 days prior to his procedure. Patient verbalized understanding.

## 2022-09-11 NOTE — Patient Instructions (Addendum)
Your provider has requested that you go to the basement level for lab work before leaving today. Press "B" on the elevator. The lab is located at the first door on the left as you exit the elevator.   You have been scheduled for a colonoscopy. Please follow written instructions given to you at your visit today.  Please pick up your prep supplies at the pharmacy within the next 1-3 days. If you use inhalers (even only as needed), please bring them with you on the day of your procedure.  Continue Stelara and pantoprazole.   You will be contacted by our office prior to your procedure for directions on holding your Plavix.  If you do not hear from our office 1 week prior to your scheduled procedure, please call [phone] to discuss.   _______________________________________________________  If your blood pressure at your visit was 140/90 or greater, please contact your primary care physician to follow up on this.  _______________________________________________________  If you are age 73 or older, your body mass index should be between 23-30. Your Body mass index is 28.82 kg/m. If this is out of the aforementioned range listed, please consider follow up with your Primary Care Provider.  If you are age 75 or younger, your body mass index should be between 19-25. Your Body mass index is 28.82 kg/m. If this is out of the aformentioned range listed, please consider follow up with your Primary Care Provider.   ________________________________________________________  The [facility] providers would like to encourage you to use [facility] to communicate with providers for non-urgent requests or questions.  Due to long hold times on the telephone, sending your provider a message by [facility] may be a faster and more efficient way to get a response.  Please allow 48 business hours for a response.  Please remember that this is for non-urgent requests.   _______________________________________________________

## 2022-09-11 NOTE — Telephone Encounter (Signed)
   Patient Name: Darryl [name]  DOB: 1951/01/22 MRN: [id number]  Primary Cardiologist: Bryan Lemma, MD  Chart reviewed as part of pre-operative protocol coverage. Per Dr. Elissa Hefty most recent OV note, "Plan: Continue maintenance Plavix and 5 mg daily. Okay to hold Plavix 5 to 7 days preop for surgeries or procedures."  Patient may hold Plavix for 5 days prior to procedure. Please resume Plavix as soon as possible postprocedure, at the discretion of the surgeon.  I will route this recommendation to the requesting party via Epic fax function and remove from pre-op pool.  Please call with questions.  Joylene Grapes, NP 09/11/2022, 11:40 AM

## 2022-09-11 NOTE — Progress Notes (Signed)
Subjective:    Patient ID: Darryl [name], male    DOB: [birth year], 72 y.o.   MRN: [id number]  HPI Darryl [name] is a 72 year old male with a longstanding history of Crohn's colitis (previously 6-MP stopped due to pancytopenia, Humira stopped due to dermatologic issues, now Stelara Jan 2023), prior colon polyps, GERD, B12 deficiency, CAD status post PCI on Plavix, ischemic cardiomyopathy with a EF 40 to 45%, diabetes who is here for follow-up.  He was last seen in the office in January 2023.  He reports he is feeling very well.  He and his wife have moved to [facility].  She is having some issues with memory.  They really enjoy where they are living now.  He is working part-time as an Therapist, sports tax returns.  He enjoys this work and is able to set his own schedule.  He has had no trouble with abdominal pain.  Stools are regular.  No blood in stool, no mucus.  No rectal symptoms or tenesmus.  No problems with Stelara injection.  Skin inflammatory changes resolved after changing Humira to Stelara.  He follows with Dr. Donzetta Starch for dermatology.  His heartburn is well-controlled on his pantoprazole 40 mg once daily.  No dysphagia symptom.  No recent chest pain, shortness of breath or lower extremity edema.  He follows with cardiology closely.  He does remain on Plavix.   Review of Systems As per HPI, otherwise negative  Current Medications, Allergies, Past Medical History, Past Surgical History, Family History and Social History were reviewed in [employer] record.    Objective:   Physical Exam BP 114/68   Pulse 72   Ht 5\' 7"  (1.702 m)   Wt 184 lb (83.5 kg)   BMI 28.82 kg/m  Gen: awake, alert, NAD HEENT: anicteric  CV: RRR, no mrg Pulm: CTA b/l Abd: soft, NT/ND, +BS throughout Ext: no c/c/e Neuro: nonfocal     Latest Ref Rng & Units 07/01/2022    9:46 AM 09/26/2021   11:48 AM 09/06/2020   10:35 AM  CBC  WBC 3.4 - 10.8 x10E3/uL 6.2  10.7  7.0    Hemoglobin 13.0 - 17.7 g/dL 21.3  08.6  57.8   Hematocrit 37.5 - 51.0 % 48.1  45.4  44.5   Platelets 150 - 450 x10E3/uL 275  267.0  266.0    CMP     Component Value Date/Time   NA 141 07/01/2022 0946   K 4.3 07/01/2022 0946   CL 106 07/01/2022 0946   CO2 20 07/01/2022 0946   GLUCOSE 121 (H) 07/01/2022 0946   GLUCOSE 111 (H) 09/26/2021 1148   BUN 14 07/01/2022 0946   CREATININE 0.90 07/01/2022 0946   CREATININE 1.07 11/08/2019 0913   CALCIUM 10.0 07/01/2022 0946   PROT 6.8 07/01/2022 0946   ALBUMIN 4.5 07/01/2022 0946   AST 31 07/01/2022 0946   ALT 36 07/01/2022 0946   ALKPHOS 84 07/01/2022 0946   BILITOT 0.4 07/01/2022 0946   GFR 69.49 09/26/2021 1148   EGFR 91 07/01/2022 0946   GFRNONAA 70 11/08/2019 0913         Assessment & Plan:  72 year old male with a longstanding history of Crohn's colitis (previously 6-MP stopped due to pancytopenia, Humira stopped due to dermatologic issues, now Stelara Jan 2023), prior colon polyps, GERD, B12 deficiency, CAD status post PCI on Plavix, ischemic cardiomyopathy with a EF 40 to 45%, diabetes who is here for follow-up.   Longstanding  Crohn's colitis/history of SSP --patient is doing very well on Stelara.  No clinical symptoms.  We will continue current therapy and proceed with surveillance colonoscopy.  See #2. -- Fecal calprotectin and quantiferon gold -- Surveillance colonoscopy in the [facility]; patient request Sutab for prep which is fine with me -- Continue Stelara every 8 weeks by injection -- Continue dermatologic follow-up and annual flu vaccination  2.  Chronic Plavix/CAD last PCI 2020 -- Hold Plavix 5 days before procedure - will instruct when and how to resume after procedure. Risks and benefits of procedure including bleeding, perforation, infection, missed lesions, medication reactions and possible hospitalization or surgery if complications occur explained. Additional rare but real risk of cardiovascular event such as heart  attack or ischemia/infarct of other organs off Plavix explained and need to seek urgent help if this occurs. Will communicate by phone or EMR with patient's prescribing provider that to confirm holding Plavix is reasonable in this case.   3.  B12 deficiency --continue B12 supplementation, PCP to follow B12 levels  4.  GERD --stable without alarm symptoms.  Continue pantoprazole 40 mg daily  30 minutes total spent today including patient facing time, coordination of care, reviewing medical history/procedures/pertinent radiology studies, and documentation of the encounter.

## 2022-09-13 LAB — QUANTIFERON-TB GOLD PLUS
Mitogen-NIL: 9.41 IU/mL
NIL: 0.01 IU/mL
QuantiFERON-TB Gold Plus: NEGATIVE
TB1-NIL: 0 IU/mL
TB2-NIL: 0 IU/mL

## 2022-09-25 ENCOUNTER — Other Ambulatory Visit: Payer: Self-pay | Admitting: Internal Medicine

## 2022-09-25 DIAGNOSIS — I2510 Atherosclerotic heart disease of native coronary artery without angina pectoris: Secondary | ICD-10-CM

## 2022-09-25 DIAGNOSIS — E781 Pure hyperglyceridemia: Secondary | ICD-10-CM

## 2022-09-27 ENCOUNTER — Other Ambulatory Visit: Payer: Self-pay | Admitting: Cardiology

## 2022-09-27 ENCOUNTER — Other Ambulatory Visit: Payer: Medicare PPO

## 2022-09-27 DIAGNOSIS — K5010 Crohn's disease of large intestine without complications: Secondary | ICD-10-CM

## 2022-10-03 LAB — CALPROTECTIN, FECAL: Calprotectin, Fecal: 82 ug/g (ref 0–120)

## 2022-10-14 ENCOUNTER — Other Ambulatory Visit: Payer: Self-pay | Admitting: Student in an Organized Health Care Education/Training Program

## 2022-10-14 DIAGNOSIS — I2510 Atherosclerotic heart disease of native coronary artery without angina pectoris: Secondary | ICD-10-CM

## 2022-10-14 DIAGNOSIS — E781 Pure hyperglyceridemia: Secondary | ICD-10-CM

## 2022-10-17 DIAGNOSIS — L858 Other specified epidermal thickening: Secondary | ICD-10-CM | POA: Diagnosis not present

## 2022-10-17 DIAGNOSIS — Z85828 Personal history of other malignant neoplasm of skin: Secondary | ICD-10-CM | POA: Diagnosis not present

## 2022-10-25 ENCOUNTER — Other Ambulatory Visit: Payer: Self-pay

## 2022-10-29 ENCOUNTER — Other Ambulatory Visit: Payer: Self-pay

## 2022-10-31 ENCOUNTER — Other Ambulatory Visit (HOSPITAL_COMMUNITY): Payer: Self-pay

## 2022-11-18 ENCOUNTER — Encounter: Payer: Self-pay | Admitting: Internal Medicine

## 2022-11-19 ENCOUNTER — Other Ambulatory Visit: Payer: Self-pay | Admitting: Student in an Organized Health Care Education/Training Program

## 2022-11-19 DIAGNOSIS — J4520 Mild intermittent asthma, uncomplicated: Secondary | ICD-10-CM

## 2022-11-29 ENCOUNTER — Encounter: Payer: Self-pay | Admitting: Internal Medicine

## 2022-11-29 ENCOUNTER — Ambulatory Visit (AMBULATORY_SURGERY_CENTER): Payer: Medicare PPO | Admitting: Internal Medicine

## 2022-11-29 VITALS — BP 113/62 | HR 78 | Temp 98.0°F | Resp 13 | Ht 67.0 in | Wt 184.0 lb

## 2022-11-29 DIAGNOSIS — D125 Benign neoplasm of sigmoid colon: Secondary | ICD-10-CM | POA: Diagnosis not present

## 2022-11-29 DIAGNOSIS — I2510 Atherosclerotic heart disease of native coronary artery without angina pectoris: Secondary | ICD-10-CM | POA: Diagnosis not present

## 2022-11-29 DIAGNOSIS — K50119 Crohn's disease of large intestine with unspecified complications: Secondary | ICD-10-CM | POA: Diagnosis not present

## 2022-11-29 DIAGNOSIS — E119 Type 2 diabetes mellitus without complications: Secondary | ICD-10-CM | POA: Diagnosis not present

## 2022-11-29 DIAGNOSIS — J45909 Unspecified asthma, uncomplicated: Secondary | ICD-10-CM | POA: Diagnosis not present

## 2022-11-29 MED ORDER — SODIUM CHLORIDE 0.9 % IV SOLN: 500.0000 mL | Freq: Once | INTRAVENOUS | Status: DC

## 2022-11-29 NOTE — Progress Notes (Signed)
GASTROENTEROLOGY PROCEDURE H&P NOTE   Primary Care Physician: Tyson Alias, MD    Reason for Procedure:  Longstanding Crohn's colitis  Plan:    Colonoscopy  Patient is appropriate for endoscopic procedure(s) in the ambulatory ([facility]) setting.  The nature of the procedure, as well as the risks, benefits, and alternatives were carefully and thoroughly reviewed with the patient. Ample time for discussion and questions allowed. The patient understood, was satisfied, and agreed to proceed.     HPI: Darryl [name] is a 72 y.o. male who presents for colonoscopy.  Medical history as below.  Tolerated the prep.  No recent chest pain or shortness of breath.  No abdominal pain today.  Plavix on hold x 5 days  Past Medical History:  Diagnosis Date   Allergic rhinitis, cause unspecified    Anal stenosis    Asthma    Cataract    Chronic orthostatic hypotension    Related to borderline hypotension; intolerant of blood pressure medications.  On Midodrine   Coronary artery disease involving native coronary artery of native heart without angina pectoris 06/2018    p-mRCA 100% (DES PCI - p-mRCA 100% (DES PCI - Resolute Onyx 3.5 x 26 --> ~3.7 mm)), dRCA ~50% & PAV 40%; pRI 85% (Staged DES PCI  Resolute Onyx 2.25 x 15 - ~2.5 mm). 100% LAD CTO after small D1 with ost-prox 60%. R-L collaterals to LAD seen post PCI.   Crohn's colitis (HCC)    Exostosis of unspecified site    GERD (gastroesophageal reflux disease)    History of acute inferior wall MI 06/2018   100% p-mRCA -- DES PCI (complicated by mild cardiogenic shock, partially related to 100% LAD CTO) --> staged PCI-RI   Hyperlipidemia with target LDL less than 70 07/04/2015   Now s/p MI with MV CAD - optimal LDL < 50   Hyperplastic colon polyp 02/26/2008   Hypertension    Ischemic cardiomyopathy    Initial echo shows EF of 40 to 45% with anterior hypokinesis. Cardiac MRI shows EF of 49% with anterior anteroapical  hypokinesis/akinesis and LV thrombus.   Lateral epicondylitis  of elbow    Left ventricular thrombus following MI [facility]) ==> resolved as of January 2021 10/10/2018   Cardiac MRI 09/28/2018: Mildly dilated LV.  EF~ 49%.  Mid Anteriopr/Anteroseptal & Apical Anterior HK.  LV APICAL THROMBUS (partially layered) 22 x 17 x 11. ==> b) f/u Echo 03/2019 -No LV Thrombus (NO LONGER ON WARFARIN)   Leukopenia    Macrocytic anemia    Related to azathioprine   Myocardial infarction [facility])    Nontraumatic rupture of patellar tendon    Obesity, unspecified    Regional enteritis of unspecified site    Seasonal allergies    Sessile colonic polyp    Vitamin B12 deficiency     Past Surgical History:  Procedure Laterality Date   CARDIAC MRI  09/2018   Mildly dilated LV.  EF estimated roughly 49%.  Hypokinesis of the mid anterior, anteroseptal and apical anterior wall.  There is also apical thrombus (partially layered) 22 x 17 x 11. -->  50-75% late gadolinium enhancement of the mid anterior, anteroseptal and apical anterior and septal walls suggestive of near full-thickness scar w/ minimal viability (poor recovery prognosis w/ REVASCULARIZATION   COLONOSCOPY     2016   CORONARY STENT INTERVENTION N/A 07/24/2018   Procedure: CORONARY STENT INTERVENTION;  Surgeon: Lennette Bihari, MD;  Location: [facility] INVASIVE CV LAB;  Service: Cardiovascular: Staged  PCI pRI 85-90%: Resolute Onyx DES 2.25 x 15 (2.5 mm)   CORONARY/GRAFT ACUTE MI REVASCULARIZATION N/A 07/23/2018   Procedure: CORONARY/GRAFT ACUTE MI REVASCULARIZATION;  Surgeon: Marykay Lex, MD;  Location: [facility] INVASIVE CV LAB;  Service: Cardiovascular;::  p-mRCA 100% (DES PCI - p-mRCA 100% (DES PCI - Resolute Onyx 3.5 x 26 (post-dilated ~3.7 mm)), dRCA ~50% & PAV 40%; pRI 85% (Staged PCI). 100% LAD CTO after small D1 with ost-prox 60%. R-L collaterals to LAD seen post PCI.;    LEFT HEART CATH N/A 07/24/2018   Procedure: LEFT HEART CATH;  Surgeon: Lennette Bihari, MD;   Location: [facility] INVASIVE CV LAB;  Service: Cardiovascular;  Laterality: N/A; 85-90% pRCA, 60% ost D1 then 100% CTO of LAD -- staged DES PCI LAD   LEFT HEART CATH AND CORONARY ANGIOGRAPHY  07/23/2018   Procedure: LEFT HEART CATH;  Surgeon: Marykay Lex., MD;  Location: [facility] INVASIVE CV LAB   p-mRCA 100% (DES PCI),dRCA ~50% & PAV 40%; pRI 85% (Staged PCI). 100% LAD CTO after small D1 with ost-prox 60%. R-L collaterals to LAD seen post PCI.;    RIGHT HEART CATH N/A 07/23/2018   Procedure: RIGHT HEART CATH;  Surgeon: Marykay Lex, MD;  Location: [facility] INVASIVE CV LAB;  Service: Cardiovascular;  Normal RHC #s, CO/CI normal   TONSILLECTOMY AND ADENOIDECTOMY     TRANSTHORACIC ECHOCARDIOGRAM  07/23/2018   Insetting of inferior MI with occluded LAD CTO; --Severe akinesis of the mid anteroseptal wall.  EF 40 and 45%.  GR 1 DD.;;     TRANSTHORACIC ECHOCARDIOGRAM  04/06/2019   To follow-up EF and LV thrombus:  EF 40 and 45%.  Distal septal and apical akinesis.  No LV thrombus noted.  Mild LVH.  GR 1 DD.  Normal RV.  Mild LA dilation.  Normal RA.  Mild aortic valve sclerosis with no stenosis - mild AI   --> warfarin discontinued.  Continued on Plavix alone.    Prior to Admission medications   Medication Sig Start Date End Date Taking? Authorizing Provider  ADVAIR HFA 115-21 MCG/ACT inhaler Inhale 2 puffs into the lungs 2 (two) times daily. 11/20/22  Yes Tyson Alias, MD  dapagliflozin propanediol (FARXIGA) 10 MG TABS tablet Take 1 tablet (10 mg total) by mouth daily before breakfast. 07/01/22  Yes Tyson Alias, MD  irbesartan (AVAPRO) 75 MG tablet Take 0.5 tablets (37.5 mg total) by mouth daily. 07/01/22  Yes Tyson Alias, MD  Multiple Vitamins-Minerals (CENTRUM SILVER PO) Take 1 tablet by mouth daily.   Yes [provider]  pantoprazole (PROTONIX) 40 MG tablet Take 1 tablet (40 mg total) by mouth daily. 07/01/22  Yes Tyson Alias, MD  rosuvastatin (CRESTOR) 40 MG  tablet TAKE 1 TABLET ONCE DAILY. 06/26/22  Yes Marykay Lex, MD  VASCEPA 1 g capsule TAKE TWO CAPSULES BY MOUTH TWICE DAILY 10/14/22  Yes Tyson Alias, MD  acitretin (SORIATANE) 10 MG capsule Take 10 mg by mouth daily. Patient not taking: Reported on 11/29/2022 04/25/22   [provider]  clopidogrel (PLAVIX) 75 MG tablet TAKE ONE TABLET BY MOUTH ONCE DAILY 01/16/22   Marykay Lex, MD  metoprolol succinate (TOPROL-XL) 25 MG 24 hr tablet TAKE 1 TABLET ONCE DAILY. Patient not taking: Reported on 11/29/2022 04/10/22   Marykay Lex, MD  montelukast (SINGULAIR) 10 MG tablet Take 1 tablet (10 mg total) by mouth at bedtime. 07/01/22   Tyson Alias, MD  nitroGLYCERIN (NITROSTAT) 0.4  MG SL tablet PLACE 1 TAB UNDER TONGUE AS NEEDED FOR CHEST PAIN. MAY REPEAT EVERY 5 MIN FOR A TOTAL OF 3 DOSES. Patient not taking: Reported on 11/29/2022 09/27/22   Marykay Lex, MD  ustekinumab [facility]) 90 MG/ML SOSY injection Inject Stelara 90mg  into skin every 8 weeks starting 06/28/21. 07/10/22   Judee Hennick, Carie Caddy, MD  VENTOLIN HFA 108 (90 Base) MCG/ACT inhaler USE 2 PUFFS EVERY 6 HOURS AS NEEDED FOR WHEEZING. Patient not taking: Reported on 11/29/2022 10/14/22   Tyson Alias, MD    Current Outpatient Medications  Medication Sig Dispense Refill   ADVAIR [facility] 115-21 MCG/ACT inhaler Inhale 2 puffs into the lungs 2 (two) times daily. 12 g 1   dapagliflozin propanediol (FARXIGA) 10 MG TABS tablet Take 1 tablet (10 mg total) by mouth daily before breakfast. 90 tablet 3   irbesartan (AVAPRO) 75 MG tablet Take 0.5 tablets (37.5 mg total) by mouth daily. 45 tablet 3   Multiple Vitamins-Minerals (CENTRUM SILVER PO) Take 1 tablet by mouth daily.     pantoprazole (PROTONIX) 40 MG tablet Take 1 tablet (40 mg total) by mouth daily. 90 tablet 3   rosuvastatin (CRESTOR) 40 MG tablet TAKE 1 TABLET ONCE DAILY. 90 tablet 3   VASCEPA 1 g capsule TAKE TWO CAPSULES BY MOUTH TWICE DAILY 360 capsule 1    acitretin (SORIATANE) 10 MG capsule Take 10 mg by mouth daily. (Patient not taking: Reported on 11/29/2022)     clopidogrel (PLAVIX) 75 MG tablet TAKE ONE TABLET BY MOUTH ONCE DAILY 90 tablet 3   metoprolol succinate (TOPROL-XL) 25 MG 24 hr tablet TAKE 1 TABLET ONCE DAILY. (Patient not taking: Reported on 11/29/2022) 90 tablet 3   montelukast (SINGULAIR) 10 MG tablet Take 1 tablet (10 mg total) by mouth at bedtime. 90 tablet 3   nitroGLYCERIN (NITROSTAT) 0.4 MG SL tablet PLACE 1 TAB UNDER TONGUE AS NEEDED FOR CHEST PAIN. MAY REPEAT EVERY 5 MIN FOR A TOTAL OF 3 DOSES. (Patient not taking: Reported on 11/29/2022) 25 tablet 3   ustekinumab (STELARA) 90 MG/ML SOSY injection Inject Stelara 90mg  into skin every 8 weeks starting 06/28/21. 1 mL 11   VENTOLIN HFA 108 (90 Base) MCG/ACT inhaler USE 2 PUFFS EVERY 6 HOURS AS NEEDED FOR WHEEZING. (Patient not taking: Reported on 11/29/2022) 18 g 1   Current Facility-Administered Medications  Medication Dose Route Frequency Provider Last Rate Last Admin   0.9 %  sodium chloride infusion  500 mL Intravenous Once Corlis Angelica, Carie Caddy, MD        Allergies as of 11/29/2022 - Review Complete 11/29/2022  Allergen Reaction Noted   Allopurinol Other (See Comments) 10/05/2018   Asacol [mesalamine] Hives 11/20/2011   Mercaptopurine Other (See Comments) 10/05/2018   Amoxicillin Rash 02/07/2005   Penicillins Rash 07/23/2018   Sulfamethoxazole Rash 02/07/2005    Family History  Problem Relation Age of Onset   Diabetes Brother    Diabetes Father    Heart attack Father    Hypertension Father    Heart disease Father    Colon cancer Neg Hx    Stomach cancer Neg Hx    Hyperlipidemia Neg Hx    Sudden death Neg Hx    Cancer Neg Hx    COPD Neg Hx    Alcohol abuse Neg Hx    Drug abuse Neg Hx    Stroke Neg Hx    Rectal cancer Neg Hx    Esophageal cancer Neg Hx  Social History   Socioeconomic History   Marital status: Married    Spouse name: Not on file   Number of  children: Not on file   Years of education: Not on file   Highest education level: Not on file  Occupational History   Occupation: accountant  Tobacco Use   Smoking status: Never   Smokeless tobacco: Never  Vaping Use   Vaping status: Never Used  Substance and Sexual Activity   Alcohol use: Yes    Alcohol/week: 0.0 standard drinks of alcohol    Comment: rare   Drug use: No   Sexual activity: Yes  Other Topics Concern   Not on file  Social History Narrative   He and his wife recently moved to  [place] to take part in the socialization aspect.  They are enjoying making new friends and being involved in activities.      He is doing chair yoga 3 days a week.  Also playing pickle ball and taking manage of multiple different exercise classes this is in addition to his baseline walking exercise but she now feels safer doing and close neighborhood..      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  This keeps him busy and keeps his brain working.  Also brings in little extra income.  However, it is keeping him from doing some of his exercises so he is somewhat looking forward to having it done.   Social Determinants of Health   Financial Resource Strain: Low Risk  (11/02/2021)   Overall Financial Resource Strain (CARDIA)    Difficulty of Paying Living Expenses: Not hard at all  Food Insecurity: No Food Insecurity (11/02/2021)   Hunger Vital Sign    Worried About Running Out of Food in the Last Year: Never true    Ran Out of Food in the Last Year: Never true  Transportation Needs: No Transportation Needs (11/02/2021)   PRAPARE - Administrator, Civil Service (Medical): No    Lack of Transportation (Non-Medical): No  Physical Activity: Sufficiently Active (11/02/2021)   Exercise Vital Sign    Days of Exercise per Week: 5 days    Minutes of Exercise per Session: 30 min  Stress: No Stress Concern Present (11/02/2021)    [employer] of Occupational Health - Occupational Stress Questionnaire    Feeling of Stress : Not at all  Social Connections: Socially Integrated (11/02/2021)   Social Connection and Isolation Panel [NHANES]    Frequency of Communication with Friends and Family: Three times a week    Frequency of Social Gatherings with Friends and Family: Three times a week    Attends Religious Services: More than 4 times per year    Active Member of Clubs or Organizations: Yes    Attends Banker Meetings: 1 to 4 times per year    Marital Status: Married  Catering manager Violence: Not At Risk (11/02/2021)   Humiliation, Afraid, Rape, and Kick questionnaire    Fear of Current or Ex-Partner: No    Emotionally Abused: No    Physically Abused: No    Sexually Abused: No    Physical Exam: Vital signs in last 24 hours: @BP  (!) 132/93   Pulse 80   Temp 98 F (36.7 C)   Resp 12   Ht 5\' 7"  (1.702 m)   Wt 184 lb (83.5 kg)   SpO2 99%   BMI 28.82 kg/m  GEN: NAD EYE: Sclerae  anicteric ENT: MMM CV: Non-tachycardic Pulm: CTA b/l GI: Soft, NT/ND NEURO:  Alert & Oriented x 3   Erick Blinks, MD [facility]  11/29/2022 8:10 AM

## 2022-11-29 NOTE — Progress Notes (Signed)
Sedate, gd SR, tolerated procedure well, VSS, report to RN 

## 2022-11-29 NOTE — Op Note (Signed)
[facility] Patient Name: Chanston Royal Procedure Date: 11/29/2022 7:55 AM MRN: [id number] Endoscopist: Beverley Fiedler , MD, [id number] Age: 72 Referring MD:  Date of Birth: 06/14/1950 Gender: Male Account #: [id number] Procedure:                Colonoscopy Indications:              High risk colon cancer surveillance: Crohn's                            colitis of 8 (or more) years duration with                            one-third (or more) of the colon involved, Last                            colonoscopy: January 2022; currently Stelara every                            8 weeks Medicines:                Monitored Anesthesia Care Procedure:                Pre-Anesthesia Assessment:                           - Prior to the procedure, a History and Physical                            was performed, and patient medications and                            allergies were reviewed. The patient's tolerance of                            previous anesthesia was also reviewed. The risks                            and benefits of the procedure and the sedation                            options and risks were discussed with the patient.                            All questions were answered, and informed consent                            was obtained. Prior Anticoagulants: The patient has                            taken Plavix (clopidogrel), last dose was 5 days                            prior to procedure. ASA Grade Assessment: III - A  patient with severe systemic disease. After                            reviewing the risks and benefits, the patient was                            deemed in satisfactory condition to undergo the                            procedure.                           After obtaining informed consent, the colonoscope                            was passed under direct vision. Throughout the                            procedure, the  patient's blood pressure, pulse, and                            oxygen saturations were monitored continuously. The                            CF [id number] #[id number] was introduced through the anus                            and advanced to the cecum, identified by                            appendiceal orifice and ileocecal valve. The                            colonoscopy was performed without difficulty. The                            patient tolerated the procedure well. The quality                            of the bowel preparation was good. The ileocecal                            valve, appendiceal orifice, and rectum were                            photographed. Scope In: 8:12:43 AM Scope Out: 8:25:12 AM Scope Withdrawal Time: 0 hours 10 minutes 39 seconds  Total Procedure Duration: 0 hours 12 minutes 29 seconds  Findings:                 The digital rectal exam was normal.                           The Simple Endoscopic Score for Crohn's Disease was  determined based on the endoscopic appearance of                            the mucosa in the following segments:                           - Ileum: Findings include no ulcers present, no                            ulcerated surfaces, no affected surfaces and no                            narrowings. Segment score: 0.                           - Right Colon: Findings include no ulcers present,                            no ulcerated surfaces, no affected surfaces and no                            narrowings. Segment score: 0.                           - Transverse Colon: Findings include no ulcers                            present, no ulcerated surfaces, no affected                            surfaces and no narrowings. Segment score: 0.                           - Left Colon: Findings include no ulcers present,                            no ulcerated surfaces, no affected surfaces and no                             narrowings. Segment score: 0.                           - Rectum: Findings include no ulcers present, no                            ulcerated surfaces, no affected surfaces and no                            narrowings. Segment score: 0.                           - Total SES-CD aggregate score: 0. Four biopsies  were taken every 10 cm with a cold forceps from the                            entire colon for Crohn's disease surveillance and                            dysplasia surveillance. These biopsy specimens from                            the right colon and left colon were sent to                            Pathology.                           A 6 mm polyp was found in the sigmoid colon. The                            polyp was sessile. The polyp was removed with a                            cold snare. Resection and retrieval were complete.                           Internal hemorrhoids were found during                            retroflexion. The hemorrhoids were small. Complications:            No immediate complications. Estimated Blood Loss:     Estimated blood loss was minimal. Impression:               - Simple Endoscopic Score for Crohn's Disease: 0,                            mucosal inflammatory changes secondary to Crohn's                            disease, in remission. Biopsied.                           - One 6 mm polyp in the sigmoid colon, removed with                            a cold snare. Resected and retrieved.                           - Small internal hemorrhoids. Recommendation:           - Patient has a contact number available for                            emergencies. The signs and symptoms of potential  delayed complications were discussed with the                            patient. Return to normal activities tomorrow.                            Written discharge instructions were provided to the                             patient.                           - Resume previous diet.                           - Continue current medication, but reduce Stelara                            interval to every 6 weeks (patient reports return                            of loose stools approx. 10 days before next dose).                           - Resume Plavix (clopidogrel) at prior dose                            tomorrow. Refer to managing physician for further                            adjustment of therapy.                           - Await pathology results.                           - Repeat colonoscopy is recommended for                            surveillance. The colonoscopy date will be                            determined after pathology results from today's                            exam become available for review. Beverley Fiedler, MD 11/29/2022 8:35:04 AM This report has been signed electronically.

## 2022-11-29 NOTE — Progress Notes (Signed)
Pt's states no medical or surgical changes since previsit or office visit. 

## 2022-11-29 NOTE — Patient Instructions (Signed)
Thank you for letting us take care of your healthcare needs today. You may resume your Plavix tomorrow. Please see handouts given to you on Polyps and Hemorrhoids. Reduce your Stelara to every 6 weeks.    YOU HAD AN ENDOSCOPIC PROCEDURE TODAY AT THE  ENDOSCOPY CENTER:   Refer to the procedure report that was given to you for any specific questions about what was found during the examination.  If the procedure report does not answer your questions, please call your gastroenterologist to clarify.  If you requested that your care partner not be given the details of your procedure findings, then the procedure report has been included in a sealed envelope for you to review at your convenience later.  YOU SHOULD EXPECT: Some feelings of bloating in the abdomen. Passage of more gas than usual.  Walking can help get rid of the air that was put into your GI tract during the procedure and reduce the bloating. If you had a lower endoscopy (such as a colonoscopy or flexible sigmoidoscopy) you may notice spotting of blood in your stool or on the toilet paper. If you underwent a bowel prep for your procedure, you may not have a normal bowel movement for a few days.  Please Note:  You might notice some irritation and congestion in your nose or some drainage.  This is from the oxygen used during your procedure.  There is no need for concern and it should clear up in a day or so.  SYMPTOMS TO REPORT IMMEDIATELY:  Following lower endoscopy (colonoscopy or flexible sigmoidoscopy):  Excessive amounts of blood in the stool  Significant tenderness or worsening of abdominal pains  Swelling of the abdomen that is new, acute  Fever of 100F or higher   For urgent or emergent issues, a gastroenterologist can be reached at any hour by calling [phone]. Do not use MyChart messaging for urgent concerns.    DIET:  We do recommend a small meal at first, but then you may proceed to your regular diet.  Drink  plenty of fluids but you should avoid alcoholic beverages for 24 hours.  ACTIVITY:  You should plan to take it easy for the rest of today and you should NOT DRIVE or use heavy machinery until tomorrow (because of the sedation medicines used during the test).    FOLLOW UP: Our staff will call the number listed on your records the next business day following your procedure.  We will call around 7:15- 8:00 am to check on you and address any questions or concerns that you may have regarding the information given to you following your procedure. If we do not reach you, we will leave a message.     If any biopsies were taken you will be contacted by phone or by letter within the next 1-3 weeks.  Please call us at [phone] if you have not heard about the biopsies in 3 weeks.    SIGNATURES/CONFIDENTIALITY: You and/or your care partner have signed paperwork which will be entered into your electronic medical record.  These signatures attest to the fact that that the information above on your After Visit Summary has been reviewed and is understood.  Full responsibility of the confidentiality of this discharge information lies with you and/or your care-partner.

## 2022-11-29 NOTE — Progress Notes (Signed)
Called to room to assist during endoscopic procedure.  Patient ID and intended procedure confirmed with present staff. Received instructions for my participation in the procedure from the performing physician.  

## 2022-12-02 ENCOUNTER — Telehealth: Payer: Self-pay | Admitting: *Deleted

## 2022-12-02 NOTE — Telephone Encounter (Signed)
  Follow up Call-     11/29/2022    7:21 AM 04/11/2020   11:17 AM  Call back number  Post procedure Call Back phone  # [phone]  Permission to leave phone message Yes Yes     Patient questions:  Do you have a fever, pain , or abdominal swelling? No. Pain Score  0 *  Have you tolerated food without any problems? Yes.    Have you been able to return to your normal activities? Yes.    Do you have any questions about your discharge instructions: Diet   No. Medications  No. Follow up visit  No.  Do you have questions or concerns about your Care? No.  Actions: * If pain score is 4 or above: No action needed, pain <4.

## 2022-12-03 ENCOUNTER — Other Ambulatory Visit (HOSPITAL_COMMUNITY): Payer: Self-pay

## 2022-12-03 ENCOUNTER — Telehealth: Payer: Self-pay

## 2022-12-03 ENCOUNTER — Other Ambulatory Visit: Payer: Self-pay

## 2022-12-03 ENCOUNTER — Encounter: Payer: Self-pay | Admitting: Internal Medicine

## 2022-12-03 MED ORDER — STELARA 90 MG/ML ~~LOC~~ SOSY
90.0000 mg | PREFILLED_SYRINGE | SUBCUTANEOUS | 11 refills | Status: DC
  Filled 2022-12-03 – 2022-12-05 (×2): qty 1, 42d supply, fill #0
  Filled 2023-01-22: qty 1, 42d supply, fill #1
  Filled 2023-02-27: qty 1, 42d supply, fill #2
  Filled 2023-04-09: qty 1, 42d supply, fill #3
  Filled 2023-05-20: qty 1, 42d supply, fill #4
  Filled 2023-06-23 (×2): qty 1, 42d supply, fill #5
  Filled 2023-07-31 (×2): qty 1, 42d supply, fill #6
  Filled 2023-09-17: qty 1, 42d supply, fill #7
  Filled 2023-10-24: qty 1, 42d supply, fill #8

## 2022-12-03 NOTE — Telephone Encounter (Signed)
Please see note below regarding change in Stelara script. Prescription entered for [employer] and PA team notified of change in order, will need new PA.  Illene Bolus, CMA  Chrystie Nose, RN Hey, This patient had a colonoscopy with Dr. Rhea Belton on 11/29/22. Per the procedure report he wanted the patient to reduce Stelara interval to every 6 weeks (patient reports return of loose stools approx. 10 days before next dose). I was not sure if he had sent you something on this patient so I wanted to let you know. Thanks.

## 2022-12-05 ENCOUNTER — Telehealth: Payer: Self-pay | Admitting: Pharmacy Technician

## 2022-12-05 ENCOUNTER — Other Ambulatory Visit (HOSPITAL_COMMUNITY): Payer: Self-pay

## 2022-12-05 NOTE — Telephone Encounter (Signed)
Pharmacy Patient Advocate Encounter  Received notification from [facility] that Prior Authorization for [facility] 90MG  has been APPROVED from 1.1.24 to 12.31.24. Ran test claim, Copay is $0. This test claim was processed through [facility]- copay amounts may vary at other pharmacies due to pharmacy/plan contracts, or as the patient moves through the different stages of their insurance plan.   PA #/Case ID/Reference #: [id number]

## 2022-12-05 NOTE — Telephone Encounter (Signed)
Pharmacy Patient Advocate Encounter   Received notification from CoverMyMeds that prior authorization for [facility] 90MG  is required/requested.   Insurance verification completed.   The patient is insured through [employer] .   Per test claim: PA required; PA submitted to [facility] via CoverMyMeds Key/confirmation #/EOC [id number] Status is pending

## 2022-12-06 ENCOUNTER — Other Ambulatory Visit: Payer: Self-pay

## 2022-12-09 NOTE — Telephone Encounter (Signed)
Any update on the prior auth for the change in Stelara?

## 2022-12-10 ENCOUNTER — Other Ambulatory Visit (HOSPITAL_COMMUNITY): Payer: Self-pay

## 2022-12-11 NOTE — Telephone Encounter (Signed)
Per the PA that was submitted it has been approved for every 42 days/6weeks! So we should be good to go!

## 2022-12-11 NOTE — Telephone Encounter (Signed)
Spoke with pt and he is aware that med has been approved for every 6 weeks.

## 2022-12-11 NOTE — Telephone Encounter (Signed)
Was it approved for every 6 week dosing?

## 2022-12-11 NOTE — Telephone Encounter (Signed)
PA request has been Approved. New Encounter created for follow up. For additional info see Pharmacy Prior Auth telephone encounter from 09/12.

## 2022-12-12 ENCOUNTER — Other Ambulatory Visit (HOSPITAL_COMMUNITY): Payer: Self-pay

## 2022-12-23 ENCOUNTER — Ambulatory Visit: Payer: Medicare PPO | Admitting: Student in an Organized Health Care Education/Training Program

## 2022-12-23 ENCOUNTER — Encounter: Payer: Self-pay | Admitting: Student in an Organized Health Care Education/Training Program

## 2022-12-23 VITALS — BP 123/71 | HR 79 | Temp 97.7°F | Ht 66.0 in | Wt 183.3 lb

## 2022-12-23 DIAGNOSIS — E118 Type 2 diabetes mellitus with unspecified complications: Secondary | ICD-10-CM | POA: Diagnosis not present

## 2022-12-23 DIAGNOSIS — M7581 Other shoulder lesions, right shoulder: Secondary | ICD-10-CM | POA: Insufficient documentation

## 2022-12-23 DIAGNOSIS — I10 Essential (primary) hypertension: Secondary | ICD-10-CM

## 2022-12-23 DIAGNOSIS — E1169 Type 2 diabetes mellitus with other specified complication: Secondary | ICD-10-CM

## 2022-12-23 DIAGNOSIS — E785 Hyperlipidemia, unspecified: Secondary | ICD-10-CM | POA: Diagnosis not present

## 2022-12-23 DIAGNOSIS — Z7984 Long term (current) use of oral hypoglycemic drugs: Secondary | ICD-10-CM | POA: Diagnosis not present

## 2022-12-23 DIAGNOSIS — I255 Ischemic cardiomyopathy: Secondary | ICD-10-CM

## 2022-12-23 DIAGNOSIS — M75101 Unspecified rotator cuff tear or rupture of right shoulder, not specified as traumatic: Secondary | ICD-10-CM

## 2022-12-23 DIAGNOSIS — K50918 Crohn's disease, unspecified, with other complication: Secondary | ICD-10-CM

## 2022-12-23 DIAGNOSIS — Z79899 Other long term (current) drug therapy: Secondary | ICD-10-CM

## 2022-12-23 DIAGNOSIS — I2510 Atherosclerotic heart disease of native coronary artery without angina pectoris: Secondary | ICD-10-CM

## 2022-12-23 DIAGNOSIS — Z23 Encounter for immunization: Secondary | ICD-10-CM

## 2022-12-23 LAB — GLUCOSE, CAPILLARY: Glucose-Capillary: 195 mg/dL — ABNORMAL HIGH (ref 70–99)

## 2022-12-23 LAB — POCT GLYCOSYLATED HEMOGLOBIN (HGB A1C): Hemoglobin A1C: 6.7 % — AB (ref 4.0–5.6)

## 2022-12-23 NOTE — Assessment & Plan Note (Signed)
A1c of 6.6 today. Well controlled, goal <7.0. No sx of hypoglycemia. Diabetic foot exam was normal.   Continue Farxiga 10 mg.

## 2022-12-23 NOTE — Progress Notes (Signed)
Attestation for Student Documentation:  I personally was present and re-performed the history, physical exam and medical decision-making activities of this service and have verified that the service and findings are accurately documented in the student's note.  72 year old person here for management of hypertension diabetes.  Doing very well since I last saw him.  Good adherence with medications and no side effects.  A1c is under great control at 6.7%.  Weight is down about 4 pounds since I last saw him.  He is living an active lifestyle, golfs about once weekly.  Very functional, great quality of life.  Has some right shoulder pain consistent with rotator cuff tendinitis, mostly of the infraspinatus and supraspinatus.  Only a little impingement syndrome, we will continue supportive care.  If no better in about 4 more weeks can consider steroid injection to the subacromial space.  Chronic HFrEF is stable and well compensated, we are continuing irbesartan, Farxiga, metoprolol.  Darryl Alias, MD 12/23/2022, 11:04 AM

## 2022-12-23 NOTE — Assessment & Plan Note (Addendum)
Blood pressure today was within goal at 123/71. He currently takes irbesartan and metoprolol due to history of ischemic cardiomyopathy. Continue current regimen of Toprol 25 mg daily, irbesartan 75 mg daily.

## 2022-12-23 NOTE — Assessment & Plan Note (Signed)
Chronic and stable. Continue Crestor/rosuvastatin 40 mg and Plavix/clopiodogrel 75 mg.

## 2022-12-23 NOTE — Assessment & Plan Note (Signed)
Darryl [name] denies any SOB, chest palpitations or diaphoresis. Reports he has not used his nitroglycerin. He follows with cardiology. Blood pressure and diabetes are well controlled. Labs in April were normal. Currently taking Farxiga 25, metoprolol succinate 25 mg, Rosuvastatin 40 mg, and Irbesartan 75 mg.   A/P  - Chronic and stable  - Continue current regimen - Labs - CMP, A1c, Urine microalbumin:creatinine

## 2022-12-23 NOTE — Progress Notes (Signed)
Subjective:   Patient ID: Darryl [name] male   DOB: 12/14/1950 72 y.o.   MRN: [id number]  HPI: [name] is a 72 y.o. male with PMH of ischemic cardiomyopathy, Chron's disease, hyperlipidemia, hypertension and psoriasis presenting to clinic for routine follow up with concerns of right shoulder pain. It started 2 weeks ago while he was picking up his golf clubs and he felt the pain. Since then he has only tried using ice compress which helps but it bothers his sleep if he sleeps on the right side. Has not taken any anti-inflammatory medicines.  No other concerns. Follows up with multiple specialists including with dermatology for psoriasis, GI for Chron's disease and cardiologist for CAD.  No SOB, chest pain, n/v, diarrhea, polyuria, edema or headaches. 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 Please see problem based assessment and plan for details of visit.    Past Medical History:  Diagnosis Date   Allergic rhinitis, cause unspecified    Anal stenosis    Asthma    Cataract    Chronic orthostatic hypotension    Related to borderline hypotension; intolerant of blood pressure medications.  On Midodrine   Coronary artery disease involving native coronary artery of native heart without angina pectoris 06/2018    p-mRCA 100% (DES PCI - p-mRCA 100% (DES PCI - Resolute Onyx 3.5 x 26 --> ~3.7 mm)), dRCA ~50% & PAV 40%; pRI 85% (Staged DES PCI  Resolute Onyx 2.25 x 15 - ~2.5 mm). 100% LAD CTO after small D1 with ost-prox 60%. R-L collaterals to LAD seen post PCI.   Crohn's colitis (HCC)    Exostosis of unspecified site    GERD (gastroesophageal reflux disease)    History of acute inferior wall MI 06/2018   100% p-mRCA -- DES PCI (complicated by mild cardiogenic shock, partially related to 100% LAD CTO) --> staged PCI-RI   Hyperlipidemia with target LDL less than 70 07/04/2015   Now s/p MI with MV CAD - optimal LDL < 50   Hyperplastic colon  polyp 02/26/2008   Hypertension    Ischemic cardiomyopathy    Initial echo shows EF of 40 to 45% with anterior hypokinesis. Cardiac MRI shows EF of 49% with anterior anteroapical hypokinesis/akinesis and LV thrombus.   Lateral epicondylitis  of elbow    Left ventricular thrombus following MI [facility]) ==> resolved as of January 2021 10/10/2018   Cardiac MRI 09/28/2018: Mildly dilated LV.  EF~ 49%.  Mid Anteriopr/Anteroseptal & Apical Anterior HK.  LV APICAL THROMBUS (partially layered) 22 x 17 x 11. ==> b) f/u Echo 03/2019 -No LV Thrombus (NO LONGER ON WARFARIN)   Leukopenia    Macrocytic anemia    Related to azathioprine   Myocardial infarction [facility])    Nontraumatic rupture of patellar tendon    Obesity, unspecified    Regional enteritis of unspecified site    Seasonal allergies    Sessile colonic polyp    Vitamin B12 deficiency    Current Outpatient Medications  Medication Sig Dispense Refill   acitretin (SORIATANE) 10 MG capsule Take 10 mg by mouth daily. (Patient not taking: Reported on 11/29/2022)     ADVAIR HFA 115-21 MCG/ACT inhaler Inhale 2 puffs into the lungs 2 (two) times daily. 12 g 1   clopidogrel (PLAVIX) 75 MG tablet TAKE ONE TABLET BY MOUTH ONCE DAILY 90 tablet 3   dapagliflozin propanediol (FARXIGA) 10 MG TABS tablet Take 1 tablet (10  mg total) by mouth daily before breakfast. 90 tablet 3   irbesartan (AVAPRO) 75 MG tablet Take 0.5 tablets (37.5 mg total) by mouth daily. 45 tablet 3   metoprolol succinate (TOPROL-XL) 25 MG 24 hr tablet TAKE 1 TABLET ONCE DAILY. (Patient not taking: Reported on 11/29/2022) 90 tablet 3   montelukast (SINGULAIR) 10 MG tablet Take 1 tablet (10 mg total) by mouth at bedtime. 90 tablet 3   Multiple Vitamins-Minerals (CENTRUM SILVER PO) Take 1 tablet by mouth daily.     nitroGLYCERIN (NITROSTAT) 0.4 MG SL tablet PLACE 1 TAB UNDER TONGUE AS NEEDED FOR CHEST PAIN. MAY REPEAT EVERY 5 MIN FOR A TOTAL OF 3 DOSES. (Patient not taking: Reported on 11/29/2022) 25  tablet 3   pantoprazole (PROTONIX) 40 MG tablet Take 1 tablet (40 mg total) by mouth daily. 90 tablet 3   rosuvastatin (CRESTOR) 40 MG tablet TAKE 1 TABLET ONCE DAILY. 90 tablet 3   ustekinumab (STELARA) 90 MG/ML SOSY injection Inject Stelara 90mg  into skin every 8 weeks starting 06/28/21. 1 mL 11   ustekinumab (STELARA) 90 MG/ML SOSY injection Inject 1 mL (90 mg total) into the skin every 6 (six) weeks. 1 mL 11   VASCEPA 1 g capsule TAKE TWO CAPSULES BY MOUTH TWICE DAILY 360 capsule 1   VENTOLIN HFA 108 (90 Base) MCG/ACT inhaler USE 2 PUFFS EVERY 6 HOURS AS NEEDED FOR WHEEZING. (Patient not taking: Reported on 11/29/2022) 18 g 1   No current facility-administered medications for this visit.   Family History  Problem Relation Age of Onset   Diabetes Brother    Diabetes Father    Heart attack Father    Hypertension Father    Heart disease Father    Colon cancer Neg Hx    Stomach cancer Neg Hx    Hyperlipidemia Neg Hx    Sudden death Neg Hx    Cancer Neg Hx    COPD Neg Hx    Alcohol abuse Neg Hx    Drug abuse Neg Hx    Stroke Neg Hx    Rectal cancer Neg Hx    Esophageal cancer Neg Hx    Social History   Socioeconomic History   Marital status: Married    Spouse name: Not on file   Number of children: Not on file   Years of education: Not on file   Highest education level: Not on file  Occupational History   Occupation: accountant  Tobacco Use   Smoking status: Never   Smokeless tobacco: Never  Vaping Use   Vaping status: Never Used  Substance and Sexual Activity   Alcohol use: Yes    Alcohol/week: 0.0 standard drinks of alcohol    Comment: rare   Drug use: No   Sexual activity: Yes  Other Topics Concern   Not on file  Social History Narrative   He and his wife recently moved to  [place] to take part in the socialization aspect.  They are enjoying making new friends and being involved in activities.      He is doing chair yoga 3 days a week.  Also playing  pickle ball and taking manage of multiple different exercise classes this is in addition to his baseline walking exercise but she now feels safer doing and close neighborhood..      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  This keeps him busy and keeps his brain working.  Also brings in little  extra income.  However, it is keeping him from doing some of his exercises so he is somewhat looking forward to having it done.   Social Determinants of Health   Financial Resource Strain: Low Risk  (11/02/2021)   Overall Financial Resource Strain (CARDIA)    Difficulty of Paying Living Expenses: Not hard at all  Food Insecurity: No Food Insecurity (11/02/2021)   Hunger Vital Sign    Worried About Running Out of Food in the Last Year: Never true    Ran Out of Food in the Last Year: Never true  Transportation Needs: No Transportation Needs (11/02/2021)   PRAPARE - Administrator, Civil Service (Medical): No    Lack of Transportation (Non-Medical): No  Physical Activity: Sufficiently Active (11/02/2021)   Exercise Vital Sign    Days of Exercise per Week: 5 days    Minutes of Exercise per Session: 30 min  Stress: No Stress Concern Present (11/02/2021)   [employer] of Occupational Health - Occupational Stress Questionnaire    Feeling of Stress : Not at all  Social Connections: Socially Integrated (11/02/2021)   Social Connection and Isolation Panel [NHANES]    Frequency of Communication with Friends and Family: Three times a week    Frequency of Social Gatherings with Friends and Family: Three times a week    Attends Religious Services: More than 4 times per year    Active Member of Clubs or Organizations: Yes    Attends Banker Meetings: 1 to 4 times per year    Marital Status: Married   Review of Systems: ROS negative except for what is noted on the assessment and plan.  Objective:  Physical Exam: Vitals:   12/23/22  0816  BP: 123/71  Pulse: 79  Temp: 97.7 F (36.5 C)  TempSrc: Oral  SpO2: 97%  Weight: 183 lb 4.8 oz (83.1 kg)  Height: 5\' 6"  (1.676 m)    General appearance: well appearing  Eyes: anicteric sclerae, moist conjunctivae; no lid-lag; PERRLA, tracking appropriately Lungs: CTAB, no crackles, no wheeze, with normal respiratory effort and no intercostal retractions CV: RRR, S1, S2, no MRGs  Extremities: No peripheral edema, radial and DP pulses present bilaterally; See problem based A/p for right shoulder findings  Skin: Normal temperature, turgor and texture; no rash Psych: Appropriate affect Neuro: Alert and oriented to person and place, no focal deficit   Assessment & Plan:  Right rotator cuff tendinitis On exam, R shoulder has no erythema, or swelling but anterior tenderness to palpitation. Decreased active ROM with pain on external rotation, and abduction. Normal adduction and internal rotation. Positive empty can test. No cervical radiculopathy. Left shoulder unremarkable.   Assessment  - Most likely rotator cuff tendinitis  Plan  - Ice compress  - Take NSAIDs as needed - Continue ROM exercises with rests if sx worsen  Essential hypertension Blood pressure today was within goal at 123/71. He currently takes irbesartan and metoprolol due to history of ischemic cardiomyopathy. Continue current regimen of Toprol 25 mg daily, irbesartan 75 mg daily.   Ischemic cardiomyopathy Mr. Pea denies any SOB, chest palpitations or diaphoresis. Reports he has not used his nitroglycerin. He follows with cardiology. Blood pressure and diabetes are well controlled. Labs in April were normal. Currently taking Farxiga 25, metoprolol succinate 25 mg, Rosuvastatin 40 mg, and Irbesartan 75 mg.   A/P  - Chronic and stable  - Continue current regimen - Labs - CMP, A1c, Urine microalbumin:creatinine   Crohn's disease (  HCC)  Follows with GI. Recently had a colonoscopy with one polyp found which was  removed. No concerns of fatigue, diarrhea, n/v or bowel changes.  Chronic and stable. Continue Stelara/ustekinumab 90mg /ml injections.   Type II diabetes mellitus with manifestations (HCC) A1c of 6.6 today. Well controlled, goal <7.0. No sx of hypoglycemia. Diabetic foot exam was normal.   Continue Farxiga 10 mg.  Hyperlipidemia associated with type 2 diabetes mellitus (HCC) Chronic and stable. Continue Crestor/rosuvastatin 40 mg and Plavix/clopiodogrel 75 mg.  Patient discussed with Dr. Oswaldo Done

## 2022-12-23 NOTE — Patient Instructions (Signed)
Thank you, [name] for allowing [place] to provide your care today. Today we discussed the following.   Shoulder Pain - You have rotator cuff tendinitis. It usually takes 4-6 weeks to get better. You can continue ice compress and take Alleve or Ibuprofen (occasionally, not everyday) if the pain is bothering you. Call us if the pain gets worse.  Diabetes - well controlled. Labs- We are taking labs to check kidney function and blood count.   I have ordered the following labs for you:   Lab Orders         Glucose, capillary         CMP14 + Anion Gap         CBC no Diff         Microalbumin / Creatinine Urine Ratio         POC Hbg A1C       Referrals ordered today:   Referral Orders  No referral(s) requested today     I have ordered the following medication/changed the following medications:   Stop the following medications: There are no discontinued medications.   Start the following medications: No orders of the defined types were placed in this encounter.    Follow up: 3 months   Remember:    We look forward to seeing you next time. Please call our clinic at [phone] if you have any questions or concerns. The best time to call is Monday-Friday from 9am-4pm, but there is someone available 24/7. If after hours or the weekend, call the main hospital number and ask for the Internal Medicine Resident On-Call. If you need medication refills, please notify your pharmacy one week in advance and they will send [place] a request.   Thank you for trusting me with your care. Wishing you the best! [facility]

## 2022-12-23 NOTE — Assessment & Plan Note (Addendum)
On exam, R shoulder has no erythema, or swelling but anterior tenderness to palpitation. Decreased active ROM with pain on external rotation, and abduction. Normal adduction and internal rotation. Positive empty can test. No cervical radiculopathy. Left shoulder unremarkable.   Assessment  - Most likely rotator cuff tendinitis  Plan  - Ice compress  - Take NSAIDs as needed - Continue ROM exercises with rests if sx worsen

## 2022-12-23 NOTE — Assessment & Plan Note (Deleted)
A1c of 6.6 today. Well controlled, goal <7.0. No sx of hypoglycemia. Diabetic foot exam was normal.   Continue Farxiga 10 mg.

## 2022-12-23 NOTE — Assessment & Plan Note (Signed)
Follows with GI. Recently had a colonoscopy with one polyp found which was removed. No concerns of fatigue, diarrhea, n/v or bowel changes.  Chronic and stable. Continue Stelara/ustekinumab 90mg /ml injections.

## 2022-12-24 LAB — CMP14 + ANION GAP
ALT: 29 [IU]/L (ref 0–44)
AST: 28 [IU]/L (ref 0–40)
Albumin: 4.6 g/dL (ref 3.8–4.8)
Alkaline Phosphatase: 87 [IU]/L (ref 44–121)
Anion Gap: 18 mmol/L (ref 10.0–18.0)
BUN/Creatinine Ratio: 14 (ref 10–24)
BUN: 14 mg/dL (ref 8–27)
Bilirubin Total: 0.8 mg/dL (ref 0.0–1.2)
CO2: 18 mmol/L — ABNORMAL LOW (ref 20–29)
Calcium: 10.1 mg/dL (ref 8.6–10.2)
Chloride: 104 mmol/L (ref 96–106)
Creatinine, Ser: 0.99 mg/dL (ref 0.76–1.27)
Globulin, Total: 2.5 g/dL (ref 1.5–4.5)
Glucose: 147 mg/dL — ABNORMAL HIGH (ref 70–99)
Potassium: 4.4 mmol/L (ref 3.5–5.2)
Sodium: 140 mmol/L (ref 134–144)
Total Protein: 7.1 g/dL (ref 6.0–8.5)
eGFR: 80 mL/min/{1.73_m2} (ref >59)

## 2022-12-24 LAB — CBC
Hematocrit: 50.4 % (ref 37.5–51.0)
Hemoglobin: 16.9 g/dL (ref 13.0–17.7)
MCH: 32.3 pg (ref 26.6–33.0)
MCHC: 33.5 g/dL (ref 31.5–35.7)
MCV: 96 fL (ref 79–97)
Platelets: 305 10*3/uL (ref 150–450)
RBC: 5.23 x10E6/uL (ref 4.14–5.80)
RDW: 12.5 % (ref 11.6–15.4)
WBC: 8.5 10*3/uL (ref 3.4–10.8)

## 2022-12-24 LAB — MICROALBUMIN / CREATININE URINE RATIO
Creatinine, Urine: 69.1 mg/dL
Microalb/Creat Ratio: 7 mg/g{creat} (ref 0–29)
Microalbumin, Urine: 4.8 ug/mL

## 2023-01-17 ENCOUNTER — Other Ambulatory Visit: Payer: Self-pay

## 2023-01-22 ENCOUNTER — Other Ambulatory Visit: Payer: Self-pay

## 2023-01-22 NOTE — Progress Notes (Signed)
Specialty Pharmacy Refill Coordination Note  Darryl [name] is a 72 y.o. male contacted today regarding refills of specialty medication(s) Ustekinumab (Antipsoriatics)   Patient requested [name] at [facility] at [place] date: 01/27/23   Medication will be filled on 01/24/23.

## 2023-01-24 ENCOUNTER — Other Ambulatory Visit (HOSPITAL_COMMUNITY): Payer: Self-pay

## 2023-01-25 ENCOUNTER — Other Ambulatory Visit: Payer: Self-pay | Admitting: Cardiology

## 2023-01-27 ENCOUNTER — Telehealth: Payer: Self-pay

## 2023-01-27 MED ORDER — BENZONATATE 100 MG PO CAPS: 100.0000 mg | ORAL_CAPSULE | Freq: Three times a day (TID) | ORAL | 0 refills | Status: DC | PRN

## 2023-01-27 NOTE — Telephone Encounter (Signed)
RTC fro  patient requesting something for a cough.  Currently coughing up yellow phlegm.  Has been doing for a few weeks.  No fever per patient .  Blowing 4 to 5 on his Peal Flow Meter which he says is normal for him.  [place] would come and go. Cough is ,mostly in the AM.  Starting to have some congestion in the nose as well.   Would like something called into the [facility] is possible.  Patient was informed that he may need to try and schedule an appointment or go to Urgent Care.

## 2023-01-27 NOTE — Telephone Encounter (Signed)
Requesting to speak with a nurse about cough, wanting cough mediation.  Please call pt back.

## 2023-01-27 NOTE — Telephone Encounter (Signed)
Call to patient informed him that cough medication has ben sent to his pharmacy.  Patient was asked to call if cough worsens to be seen in the Clinics.

## 2023-01-27 NOTE — Telephone Encounter (Signed)
Ok. It sounds like post infectious cough in that case. I have sent an Rx for Tessalon to his pharmacy. I hope that is helpful. Should be fine to use in the morning as it is not sedating. No interactions with his other medications. If he is feeling poorly or short of breath, please ask him to come see me in clinic. Thanks.

## 2023-01-28 ENCOUNTER — Ambulatory Visit: Payer: Medicare PPO | Admitting: Internal Medicine

## 2023-01-28 ENCOUNTER — Encounter: Payer: Self-pay | Admitting: Internal Medicine

## 2023-01-28 ENCOUNTER — Other Ambulatory Visit: Payer: Self-pay

## 2023-01-28 VITALS — BP 114/75 | HR 77 | Temp 97.7°F | Resp 32 | Ht 66.0 in | Wt 182.3 lb

## 2023-01-28 DIAGNOSIS — R059 Cough, unspecified: Secondary | ICD-10-CM | POA: Diagnosis not present

## 2023-01-28 DIAGNOSIS — R052 Subacute cough: Secondary | ICD-10-CM

## 2023-01-28 NOTE — Progress Notes (Signed)
Subjective:  CC: Cough, congestion  HPI:  [name] is a 72 y.o. male with a past medical history stated below and presents today for above. Please see problem based assessment and plan for additional details.  Past Medical History:  Diagnosis Date   Allergic rhinitis, cause unspecified    Anal stenosis    Asthma    Cataract    Chronic orthostatic hypotension    Related to borderline hypotension; intolerant of blood pressure medications.  On Midodrine   Coronary artery disease involving native coronary artery of native heart without angina pectoris 06/2018    p-mRCA 100% (DES PCI - p-mRCA 100% (DES PCI - Resolute Onyx 3.5 x 26 --> ~3.7 mm)), dRCA ~50% & PAV 40%; pRI 85% (Staged DES PCI  Resolute Onyx 2.25 x 15 - ~2.5 mm). 100% LAD CTO after small D1 with ost-prox 60%. R-L collaterals to LAD seen post PCI.   Crohn's colitis (HCC)    Exostosis of unspecified site    GERD (gastroesophageal reflux disease)    History of acute inferior wall MI 06/2018   100% p-mRCA -- DES PCI (complicated by mild cardiogenic shock, partially related to 100% LAD CTO) --> staged PCI-RI   Hyperlipidemia with target LDL less than 70 07/04/2015   Now s/p MI with MV CAD - optimal LDL < 50   Hyperplastic colon polyp 02/26/2008   Hypertension    Ischemic cardiomyopathy    Initial echo shows EF of 40 to 45% with anterior hypokinesis. Cardiac MRI shows EF of 49% with anterior anteroapical hypokinesis/akinesis and LV thrombus.   Lateral epicondylitis  of elbow    Left ventricular thrombus following MI [facility]) ==> resolved as of January 2021 10/10/2018   Cardiac MRI 09/28/2018: Mildly dilated LV.  EF~ 49%.  Mid Anteriopr/Anteroseptal & Apical Anterior HK.  LV APICAL THROMBUS (partially layered) 22 x 17 x 11. ==> b) f/u Echo 03/2019 -No LV Thrombus (NO LONGER ON WARFARIN)   Leukopenia    Macrocytic anemia    Related to azathioprine   Myocardial infarction [facility])    Nontraumatic rupture of patellar tendon     Obesity, unspecified    Regional enteritis of unspecified site    Seasonal allergies    Sessile colonic polyp    Vitamin B12 deficiency     Current Outpatient Medications on File Prior to Visit  Medication Sig Dispense Refill   acitretin (SORIATANE) 10 MG capsule Take 10 mg by mouth daily. (Patient not taking: Reported on 11/29/2022)     ADVAIR HFA 115-21 MCG/ACT inhaler Inhale 2 puffs into the lungs 2 (two) times daily. 12 g 1   benzonatate (TESSALON PERLES) 100 MG capsule Take 1 capsule (100 mg total) by mouth 3 (three) times daily as needed for cough. 30 capsule 0   clopidogrel (PLAVIX) 75 MG tablet TAKE ONE TABLET BY MOUTH ONCE DAILY 90 tablet 1   dapagliflozin propanediol (FARXIGA) 10 MG TABS tablet Take 1 tablet (10 mg total) by mouth daily before breakfast. 90 tablet 3   irbesartan (AVAPRO) 75 MG tablet Take 0.5 tablets (37.5 mg total) by mouth daily. 45 tablet 3   metoprolol succinate (TOPROL-XL) 25 MG 24 hr tablet TAKE 1 TABLET ONCE DAILY. (Patient not taking: Reported on 11/29/2022) 90 tablet 3   montelukast (SINGULAIR) 10 MG tablet Take 1 tablet (10 mg total) by mouth at bedtime. 90 tablet 3   Multiple Vitamins-Minerals (CENTRUM SILVER PO) Take 1 tablet by mouth daily.  nitroGLYCERIN (NITROSTAT) 0.4 MG SL tablet PLACE 1 TAB UNDER TONGUE AS NEEDED FOR CHEST PAIN. MAY REPEAT EVERY 5 MIN FOR A TOTAL OF 3 DOSES. (Patient not taking: Reported on 11/29/2022) 25 tablet 3   pantoprazole (PROTONIX) 40 MG tablet Take 1 tablet (40 mg total) by mouth daily. 90 tablet 3   rosuvastatin (CRESTOR) 40 MG tablet TAKE 1 TABLET ONCE DAILY. 90 tablet 3   ustekinumab (STELARA) 90 MG/ML SOSY injection Inject 1 mL (90 mg total) into the skin every 6 (six) weeks. 1 mL 11   VASCEPA 1 g capsule TAKE TWO CAPSULES BY MOUTH TWICE DAILY 360 capsule 1   VENTOLIN HFA 108 (90 Base) MCG/ACT inhaler USE 2 PUFFS EVERY 6 HOURS AS NEEDED FOR WHEEZING. (Patient not taking: Reported on 11/29/2022) 18 g 1   No current  facility-administered medications on file prior to visit.    Review of Systems: ROS negative except for as is noted on the assessment and plan.  Objective:   Vitals:   01/28/23 1025  BP: 114/75  Pulse: 77  Resp: (!) 32  Temp: 97.7 F (36.5 C)  TempSrc: Oral  SpO2: 94%  Weight: 182 lb 4.8 oz (82.7 kg)  Height: 5\' 6"  (1.676 m)    Physical Exam: Constitutional: well-appearing, in no acute distress HENT: normocephalic atraumatic, mucous membranes moist Eyes: conjunctiva non-erythematous Neck: supple Cardiovascular: regular rate and rhythm, no m/r/g Pulmonary/Chest: normal work of breathing on room air, lungs clear to auscultation bilaterally Abdominal: soft, non-tender, non-distended MSK: normal bulk and tone Neurological: alert & oriented x 3, 5/5 strength in bilateral upper and lower extremities, normal gait Skin: warm and dry  Assessment & Plan:   Cough Patient reports several weeks of productive cough. He feels his symptoms started shortly after getting a flu shot. He denies fever, myalgias, dyspnea on exertion, chest pain. He has been using his Ventolin inhaler slightly more often than usual. He called the clinic yesterday and was prescribed Jerilynn Som, which he felt made him more congested. I feel his symptoms are consistent with a post infectious cough, and recommended supportive treatment and expectorant such as OTC robitussin. Patient was amenable to plan. Return precautions provided.    Patient seen with Dr. Cleda Daub  Monna Fam MD [facility]  PGY-1 Pager: [phone] Date 01/28/2023  Time 10:52 AM

## 2023-01-28 NOTE — Assessment & Plan Note (Signed)
Patient reports several weeks of productive cough. He feels his symptoms started shortly after getting a flu shot. He denies fever, myalgias, dyspnea on exertion, chest pain. He has been using his Ventolin inhaler slightly more often than usual. He called the clinic yesterday and was prescribed Jerilynn Som, which he felt made him more congested. I feel his symptoms are consistent with a post infectious cough, and recommended supportive treatment and expectorant such as OTC robitussin. Patient was amenable to plan. Return precautions provided.

## 2023-01-29 ENCOUNTER — Ambulatory Visit: Payer: Medicare PPO

## 2023-01-29 ENCOUNTER — Other Ambulatory Visit (HOSPITAL_COMMUNITY): Payer: Self-pay

## 2023-01-29 VITALS — Ht 66.0 in | Wt 182.0 lb

## 2023-01-29 DIAGNOSIS — Z0000 Encounter for general adult medical examination without abnormal findings: Secondary | ICD-10-CM | POA: Diagnosis not present

## 2023-01-29 NOTE — Progress Notes (Addendum)
Subjective:   Darryl [name] is a 72 y.o. male who presents for Medicare Annual/Subsequent preventive examination.  Visit Complete: Virtual I connected with  Darryl [name] on 01/29/23 by a audio enabled telemedicine application and verified that I am speaking with the correct person using two identifiers.  Patient Location: Home  Provider Location: Home Office  I discussed the limitations of evaluation and management by telemedicine. The patient expressed understanding and agreed to proceed.  Vital Signs: Because this visit was a virtual/telehealth visit, some criteria may be missing or patient reported. Any vitals not documented were not able to be obtained and vitals that have been documented are patient reported.   Cardiac Risk Factors include: advanced age (>61men, >88 women);hypertension;male gender;diabetes mellitus;dyslipidemia;Other (see comment), Risk factor comments: CAD     Objective:    Today's Vitals   01/29/23 0902  Weight: 182 lb (82.6 kg)  Height: 5\' 6"  (1.676 m)   Body mass index is 29.38 kg/m.     01/29/2023    9:09 AM 01/28/2023   10:23 AM 07/01/2022    1:58 PM 11/02/2021   10:19 AM 04/11/2020   11:21 AM 09/21/2018   10:00 PM 09/21/2018    6:44 PM  Advanced Directives  Does Patient Have a Medical Advance Directive? Yes Yes Yes Yes Yes Yes Yes  Type of Estate agent of [place];Living will Healthcare Power of [place];Living will Healthcare Power of [place];Living will Healthcare Power of [place];Living will Healthcare Power of [place];Living will Healthcare Power of [place];Living will;Out of facility DNR (pink MOST or yellow form) Healthcare Power of [place];Living will;Out of facility DNR (pink MOST or yellow form)  Does patient want to make changes to medical advance directive?   No - Patient declined  No - Patient declined No - Patient declined   Copy of Healthcare Power of Attorney in Chart? No - copy requested No - copy requested  No - copy requested No - copy requested     Would patient like information on creating a medical advance directive?       No - Patient declined    Current Medications (verified) Outpatient Encounter Medications as of 01/29/2023  Medication Sig   acitretin (SORIATANE) 10 MG capsule Take 10 mg by mouth daily.   ADVAIR HFA 115-21 MCG/ACT inhaler Inhale 2 puffs into the lungs 2 (two) times daily.   benzonatate (TESSALON PERLES) 100 MG capsule Take 1 capsule (100 mg total) by mouth 3 (three) times daily as needed for cough.   clopidogrel (PLAVIX) 75 MG tablet TAKE ONE TABLET BY MOUTH ONCE DAILY   dapagliflozin propanediol (FARXIGA) 10 MG TABS tablet Take 1 tablet (10 mg total) by mouth daily before breakfast.   irbesartan (AVAPRO) 75 MG tablet Take 0.5 tablets (37.5 mg total) by mouth daily.   metoprolol succinate (TOPROL-XL) 25 MG 24 hr tablet TAKE 1 TABLET ONCE DAILY.   montelukast (SINGULAIR) 10 MG tablet Take 1 tablet (10 mg total) by mouth at bedtime.   Multiple Vitamins-Minerals (CENTRUM SILVER PO) Take 1 tablet by mouth daily.   nitroGLYCERIN (NITROSTAT) 0.4 MG SL tablet PLACE 1 TAB UNDER TONGUE AS NEEDED FOR CHEST PAIN. MAY REPEAT EVERY 5 MIN FOR A TOTAL OF 3 DOSES.   pantoprazole (PROTONIX) 40 MG tablet Take 1 tablet (40 mg total) by mouth daily.   rosuvastatin (CRESTOR) 40 MG tablet TAKE 1 TABLET ONCE DAILY.   ustekinumab (STELARA) 90 MG/ML SOSY injection Inject 1 mL (90 mg total) into the skin  every 6 (six) weeks.   VASCEPA 1 g capsule TAKE TWO CAPSULES BY MOUTH TWICE DAILY   VENTOLIN HFA 108 (90 Base) MCG/ACT inhaler USE 2 PUFFS EVERY 6 HOURS AS NEEDED FOR WHEEZING.   No facility-administered encounter medications on file as of 01/29/2023.    Allergies (verified) Allopurinol, Asacol [mesalamine], Mercaptopurine, Amoxicillin, Penicillins, and Sulfamethoxazole   History: Past Medical History:  Diagnosis Date   Allergic rhinitis, cause unspecified    Anal stenosis    Asthma     Cataract    Chronic orthostatic hypotension    Related to borderline hypotension; intolerant of blood pressure medications.  On Midodrine   Coronary artery disease involving native coronary artery of native heart without angina pectoris 06/2018    p-mRCA 100% (DES PCI - p-mRCA 100% (DES PCI - Resolute Onyx 3.5 x 26 --> ~3.7 mm)), dRCA ~50% & PAV 40%; pRI 85% (Staged DES PCI  Resolute Onyx 2.25 x 15 - ~2.5 mm). 100% LAD CTO after small D1 with ost-prox 60%. R-L collaterals to LAD seen post PCI.   Crohn's colitis (HCC)    Exostosis of unspecified site    GERD (gastroesophageal reflux disease)    History of acute inferior wall MI 06/2018   100% p-mRCA -- DES PCI (complicated by mild cardiogenic shock, partially related to 100% LAD CTO) --> staged PCI-RI   Hyperlipidemia with target LDL less than 70 07/04/2015   Now s/p MI with MV CAD - optimal LDL < 50   Hyperplastic colon polyp 02/26/2008   Hypertension    Ischemic cardiomyopathy    Initial echo shows EF of 40 to 45% with anterior hypokinesis. Cardiac MRI shows EF of 49% with anterior anteroapical hypokinesis/akinesis and LV thrombus.   Lateral epicondylitis  of elbow    Left ventricular thrombus following MI [facility]) ==> resolved as of January 2021 10/10/2018   Cardiac MRI 09/28/2018: Mildly dilated LV.  EF~ 49%.  Mid Anteriopr/Anteroseptal & Apical Anterior HK.  LV APICAL THROMBUS (partially layered) 22 x 17 x 11. ==> b) f/u Echo 03/2019 -No LV Thrombus (NO LONGER ON WARFARIN)   Leukopenia    Macrocytic anemia    Related to azathioprine   Myocardial infarction [facility])    Nontraumatic rupture of patellar tendon    Obesity, unspecified    Regional enteritis of unspecified site    Seasonal allergies    Sessile colonic polyp    Vitamin B12 deficiency    Past Surgical History:  Procedure Laterality Date   CARDIAC MRI  09/2018   Mildly dilated LV.  EF estimated roughly 49%.  Hypokinesis of the mid anterior, anteroseptal and apical anterior wall.   There is also apical thrombus (partially layered) 22 x 17 x 11. -->  50-75% late gadolinium enhancement of the mid anterior, anteroseptal and apical anterior and septal walls suggestive of near full-thickness scar w/ minimal viability (poor recovery prognosis w/ REVASCULARIZATION   COLONOSCOPY     2016   CORONARY STENT INTERVENTION N/A 07/24/2018   Procedure: CORONARY STENT INTERVENTION;  Surgeon: Lennette Bihari, MD;  Location: [facility] INVASIVE CV LAB;  Service: Cardiovascular: Staged PCI pRI 85-90%: Resolute Onyx DES 2.25 x 15 (2.5 mm)   CORONARY/GRAFT ACUTE MI REVASCULARIZATION N/A 07/23/2018   Procedure: CORONARY/GRAFT ACUTE MI REVASCULARIZATION;  Surgeon: Marykay Lex, MD;  Location: [facility] INVASIVE CV LAB;  Service: Cardiovascular;::  p-mRCA 100% (DES PCI - p-mRCA 100% (DES PCI - Resolute Onyx 3.5 x 26 (post-dilated ~3.7 mm)), dRCA ~50% & PAV 40%;  pRI 85% (Staged PCI). 100% LAD CTO after small D1 with ost-prox 60%. R-L collaterals to LAD seen post PCI.;    LEFT HEART CATH N/A 07/24/2018   Procedure: LEFT HEART CATH;  Surgeon: Lennette Bihari, MD;  Location: [facility] INVASIVE CV LAB;  Service: Cardiovascular;  Laterality: N/A; 85-90% pRCA, 60% ost D1 then 100% CTO of LAD -- staged DES PCI LAD   LEFT HEART CATH AND CORONARY ANGIOGRAPHY  07/23/2018   Procedure: LEFT HEART CATH;  Surgeon: Marykay Lex., MD;  Location: [facility] INVASIVE CV LAB   p-mRCA 100% (DES PCI),dRCA ~50% & PAV 40%; pRI 85% (Staged PCI). 100% LAD CTO after small D1 with ost-prox 60%. R-L collaterals to LAD seen post PCI.;    RIGHT HEART CATH N/A 07/23/2018   Procedure: RIGHT HEART CATH;  Surgeon: Marykay Lex, MD;  Location: [facility] INVASIVE CV LAB;  Service: Cardiovascular;  Normal RHC #s, CO/CI normal   TONSILLECTOMY AND ADENOIDECTOMY     TRANSTHORACIC ECHOCARDIOGRAM  07/23/2018   Insetting of inferior MI with occluded LAD CTO; --Severe akinesis of the mid anteroseptal wall.  EF 40 and 45%.  GR 1 DD.;;     TRANSTHORACIC ECHOCARDIOGRAM   04/06/2019   To follow-up EF and LV thrombus:  EF 40 and 45%.  Distal septal and apical akinesis.  No LV thrombus noted.  Mild LVH.  GR 1 DD.  Normal RV.  Mild LA dilation.  Normal RA.  Mild aortic valve sclerosis with no stenosis - mild AI   --> warfarin discontinued.  Continued on Plavix alone.   Family History  Problem Relation Age of Onset   Diabetes Brother    Diabetes Father    Heart attack Father    Hypertension Father    Heart disease Father    Colon cancer Neg Hx    Stomach cancer Neg Hx    Hyperlipidemia Neg Hx    Sudden death Neg Hx    Cancer Neg Hx    COPD Neg Hx    Alcohol abuse Neg Hx    Drug abuse Neg Hx    Stroke Neg Hx    Rectal cancer Neg Hx    Esophageal cancer Neg Hx    Social History   Socioeconomic History   Marital status: Married    Spouse name: Wellsite geologist   Number of children: 2   Years of education: Not on file   Highest education level: Not on file  Occupational History   Occupation: accountant    Comment: Part time  Tobacco Use   Smoking status: Never   Smokeless tobacco: Never  Vaping Use   Vaping status: Never Used  Substance and Sexual Activity   Alcohol use: Yes    Alcohol/week: 0.0 standard drinks of alcohol    Comment: rare   Drug use: No   Sexual activity: Yes  Other Topics Concern   Not on file  Social History Narrative   He and his wife recently moved to  [place] to take part in the socialization aspect.  They are enjoying making new friends and being involved in activities.      He is doing chair yoga 3 days a week.  Also playing pickle ball and taking manage of multiple different exercise classes this is in addition to his baseline walking exercise but she now feels safer doing and close neighborhood..      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  This keeps  him busy and keeps his brain working.  Also brings in little extra income.  However, it is keeping him from doing  some of his exercises so he is somewhat looking forward to having it done.      He and wife live at [facility]   Social Determinants of Health   Financial Resource Strain: Low Risk  (01/29/2023)   Overall Financial Resource Strain (CARDIA)    Difficulty of Paying Living Expenses: Not hard at all  Food Insecurity: No Food Insecurity (01/29/2023)   Hunger Vital Sign    Worried About Running Out of Food in the Last Year: Never true    Ran Out of Food in the Last Year: Never true  Transportation Needs: No Transportation Needs (01/29/2023)   PRAPARE - Administrator, Civil Service (Medical): No    Lack of Transportation (Non-Medical): No  Physical Activity: Sufficiently Active (01/29/2023)   Exercise Vital Sign    Days of Exercise per Week: 3 days    Minutes of Exercise per Session: 60 min  Stress: No Stress Concern Present (01/29/2023)   [employer] of Occupational Health - Occupational Stress Questionnaire    Feeling of Stress : Only a little  Social Connections: Socially Integrated (01/29/2023)   Social Connection and Isolation Panel [NHANES]    Frequency of Communication with Friends and Family: More than three times a week    Frequency of Social Gatherings with Friends and Family: More than three times a week    Attends Religious Services: More than 4 times per year    Active Member of [employer] or Organizations: Yes    Attends Engineer, structural: More than 4 times per year    Marital Status: Married    Tobacco Counseling Counseling given: Not Answered   Clinical Intake:  Pre-visit preparation completed: Yes  Pain : No/denies pain     BMI - recorded: 29.38 Nutritional Status: BMI 25 -29 Overweight Nutritional Risks: None Diabetes: Yes CBG done?: No CBG resulted in Enter/ Edit results?: No Did pt. bring in CBG monitor from home?: No  How often do you need to have someone help you when you read instructions, pamphlets, or other written  materials from your doctor or pharmacy?: 1 - Never  Interpreter Needed?: No  Information entered by :: Duvan Mousel, RMA   Activities of Daily Living    01/29/2023    9:05 AM 01/28/2023   10:23 AM  In your present state of health, do you have any difficulty performing the following activities:  Hearing? 1 1  Comment Wears hearing aides Hearing aides  Vision? 0 0  Difficulty concentrating or making decisions? 0 0  Walking or climbing stairs? 0 0  Dressing or bathing? 0 0  Doing errands, shopping? 0 0  Preparing Food and eating ? N   Using the Toilet? N   In the past six months, have you accidently leaked urine? N   Do you have problems with loss of bowel control? N   Managing your Medications? N   Managing your Finances? N   Housekeeping or managing your Housekeeping? N     Patient Care Team: Tyson Alias, MD as PCP - General (Internal Medicine) Marykay Lex, MD as PCP - Cardiology (Cardiology) Center, [facility]  Indicate any recent Medical Services you may have received from other than [facility] providers in the past year (date may be approximate).     Assessment:   This is a  routine wellness examination for Darryl [name].  Hearing/Vision screen Hearing Screening - Comments:: Wears hearing aides Vision Screening - Comments:: Wears eyeglasses   Goals Addressed               This Visit's Progress     Patient Stated (pt-stated)        Patient would like to go to bed earlier and rise earlier and try to exercise.      Depression Screen    01/29/2023   11:17 AM 01/28/2023   10:33 AM 12/23/2022   10:43 AM 07/01/2022    8:38 AM 11/02/2021   10:20 AM 11/02/2021   10:18 AM 04/05/2021    2:41 PM  PHQ 2/9 Scores  PHQ - 2 Score 0 0 0 0 0 0 0  PHQ- 9 Score 1 1         Fall Risk    01/29/2023    9:10 AM 12/23/2022    8:20 AM 07/01/2022    8:34 AM 11/02/2021   10:19 AM 04/05/2021    2:36 PM  Fall Risk   Falls in the past year? 0 0 0 0 0  Number falls in past yr:  0 0 0 0   Injury with Fall? 0 0 0 0   Risk for fall due to : No Fall Risks      Follow up Falls evaluation completed;Falls prevention discussed Falls evaluation completed Falls evaluation completed Falls evaluation completed;Education provided     MEDICARE RISK AT HOME: Medicare Risk at Home Any stairs in or around the home?: No Home free of loose throw rugs in walkways, pet beds, electrical cords, etc?: Yes Adequate lighting in your home to reduce risk of falls?: Yes Life alert?: No Use of a cane, walker or w/c?: No Grab bars in the bathroom?: Yes (have them installed) Shower chair or bench in shower?: No Elevated toilet seat or a handicapped toilet?: No  TIMED UP AND GO:  Was the test performed?  No    Cognitive Function:        01/29/2023    9:11 AM 11/02/2021   10:22 AM  6CIT Screen  What Year? 0 points 0 points  What month? 0 points 0 points  What time? 0 points 0 points  Count back from 20 0 points 0 points  Months in reverse 0 points 0 points  Repeat phrase 0 points 0 points  Total Score 0 points 0 points    Immunizations Immunization History  Administered Date(s) Administered   Fluad Quad(high Dose 65+) 01/21/2019, 01/31/2021   Fluad Trivalent(High Dose 65+) 12/23/2022   Hepb-cpg 10/12/2018, 11/13/2018   Influenza, High Dose Seasonal PF 01/07/2017, 02/23/2018   Influenza,inj,Quad PF,6+ Mos 02/05/2013, 02/01/2020   PFIZER(Purple Top)SARS-COV-2 Vaccination 05/01/2019, 05/22/2019, 11/23/2019   PPD Test 11/26/2011   Pneumococcal Conjugate-13 07/04/2015   Pneumococcal Polysaccharide-23 05/19/2013, 02/04/2019   Tdap 05/19/2013   Zoster Recombinant(Shingrix) 12/14/2019, 04/14/2020    TDAP status: Up to date  Flu Vaccine status: Up to date  Pneumococcal vaccine status: Up to date  Covid-19 vaccine status: Information provided on how to obtain vaccines.   Qualifies for Shingles Vaccine? Yes   Zostavax completed Yes   Shingrix Completed?: Yes  Screening  Tests Health Maintenance  Topic Date Due   OPHTHALMOLOGY EXAM  11/23/2021   COVID-19 Vaccine (4 - 2023-24 season) 03/25/2023 (Originally 11/24/2022)   DTaP/Tdap/Td (2 - Td or Tdap) 05/20/2023   HEMOGLOBIN A1C  06/22/2023   Diabetic kidney evaluation - eGFR measurement  12/23/2023   Diabetic kidney evaluation - Urine ACR  12/23/2023   FOOT EXAM  12/23/2023   Medicare Annual Wellness (AWV)  01/29/2024   Colonoscopy  11/28/2025   Pneumonia Vaccine 52+ Years old  Completed   INFLUENZA VACCINE  Completed   Hepatitis C Screening  Completed   Zoster Vaccines- Shingrix  Completed   HPV VACCINES  Aged Out    Health Maintenance  Health Maintenance Due  Topic Date Due   OPHTHALMOLOGY EXAM  11/23/2021    Colorectal cancer screening: Type of screening: Colonoscopy. Completed 11/29/2022. Repeat every 3 years  Lung Cancer Screening: (Low Dose CT Chest recommended if Age 47-80 years, 20 pack-year currently smoking OR have quit w/in 15years.) does not qualify.   Lung Cancer Screening Referral: N/A  Additional Screening:  Hepatitis C Screening: does qualify; Completed 09/29/2018  Vision Screening: Recommended annual ophthalmology exams for early detection of glaucoma and other disorders of the eye. Is the patient up to date with their annual eye exam?  Yes  Who is the provider or what is the name of the office in which the patient attends annual eye exams? Dr. Joseph [facility] If pt is not established with a provider, would they like to be referred to a provider to establish care? No .   Dental Screening: Recommended annual dental exams for proper oral hygiene  Diabetic Foot Exam: Diabetic Foot Exam: Completed 12/23/2022  Community Resource Referral / Chronic Care Management: CRR required this visit?  No   CCM required this visit?  No     Plan:     I have personally reviewed and noted the following in the patient's chart:   Medical and social history Use of alcohol,  tobacco or illicit drugs  Current medications and supplements including opioid prescriptions. Patient is not currently taking opioid prescriptions. Functional ability and status Nutritional status Physical activity Advanced directives List of other physicians Hospitalizations, surgeries, and ER visits in previous 12 months Vitals Screenings to include cognitive, depression, and falls Referrals and appointments  In addition, I have reviewed and discussed with patient certain preventive protocols, quality metrics, and best practice recommendations. A written personalized care plan for preventive services as well as general preventive health recommendations were provided to patient.     Jermya Dowding L Alin Hutchins, CMA   01/29/2023   After Visit Summary: (MyChart) Due to this being a telephonic visit, the after visit summary with patients personalized plan was offered to patient via MyChart   Nurse Notes: Patient is due for a Covid vaccine, as he will be getting soon.  He is up to date on all other health maintenance.  Patient stated that he has had his eyes examined for this year.  I have sent letter to requested records for eye exam.  Patient had no concerns to address today.

## 2023-01-29 NOTE — Patient Instructions (Signed)
Darryl [name] , Thank you for taking time to come for your Medicare Wellness Visit. I appreciate your ongoing commitment to your health goals. Please review the following plan we discussed and let me know if I can assist you in the future.   Referrals/Orders/Follow-Ups/Clinician Recommendations: You are due for a Covid vaccine.  Keep up the good work.  This is a list of the screening recommended for you and due dates:  Health Maintenance  Topic Date Due   Eye exam for diabetics  11/23/2021   COVID-19 Vaccine (4 - 2023-24 season) 03/25/2023*   DTaP/Tdap/Td vaccine (2 - Td or Tdap) 05/20/2023   Hemoglobin A1C  06/22/2023   Yearly kidney function blood test for diabetes  12/23/2023   Yearly kidney health urinalysis for diabetes  12/23/2023   Complete foot exam   12/23/2023   Medicare Annual Wellness Visit  01/29/2024   Colon Cancer Screening  11/28/2025   Pneumonia Vaccine  Completed   Flu Shot  Completed   Hepatitis C Screening  Completed   Zoster (Shingles) Vaccine  Completed   HPV Vaccine  Aged Out  *Topic was postponed. The date shown is not the original due date.    Advanced directives: (Copy Requested) Please bring a copy of your health care power of attorney and living will to the office to be added to your chart at your convenience.  Next Medicare Annual Wellness Visit scheduled for next year: Yes

## 2023-02-03 NOTE — Progress Notes (Signed)
Internal Medicine Clinic Attending  I was physically present during the key portions of the resident provided service and participated in the medical decision making of patient's management care. I reviewed pertinent patient test results.  The assessment, diagnosis, and plan were formulated together and I agree with the documentation in the resident's note.  Gust Rung, DO

## 2023-02-05 ENCOUNTER — Other Ambulatory Visit: Payer: Self-pay

## 2023-02-26 ENCOUNTER — Other Ambulatory Visit (HOSPITAL_COMMUNITY): Payer: Self-pay

## 2023-02-27 ENCOUNTER — Other Ambulatory Visit (HOSPITAL_COMMUNITY): Payer: Self-pay

## 2023-02-27 NOTE — Progress Notes (Signed)
Specialty Pharmacy Ongoing Clinical Assessment Note  Darryl [name] is a 72 y.o. male who is being followed by the specialty pharmacy service for RxSp Crohn's Disease   Patient's specialty medication(s) reviewed today: Ustekinumab (Antipsoriatics)   Missed doses in the last 4 weeks: 0   Patient/Caregiver did not have any additional questions or concerns.   Therapeutic benefit summary: Patient is achieving benefit   Adverse events/side effects summary: No adverse events/side effects   Patient's therapy is appropriate to: Continue    Goals Addressed             This Visit's Progress    Minimize recurrence of flares       Patient is on track. Patient will maintain adherence. Darryl [name] reports he is well-controlled at this time with no recent flares.          Follow up:  6 months  Servando Snare Specialty Pharmacist

## 2023-02-27 NOTE — Progress Notes (Signed)
Specialty Pharmacy Refill Coordination Note  Darryl [name] is a 72 y.o. male contacted today regarding refills of specialty medication(s) Ustekinumab (Antipsoriatics)   Patient requested Darryl [name] at [facility] at [place] date: 03/05/23   Medication will be filled on 03/04/23.

## 2023-02-28 ENCOUNTER — Other Ambulatory Visit: Payer: Self-pay | Admitting: Student in an Organized Health Care Education/Training Program

## 2023-02-28 DIAGNOSIS — J4520 Mild intermittent asthma, uncomplicated: Secondary | ICD-10-CM

## 2023-03-04 ENCOUNTER — Other Ambulatory Visit (HOSPITAL_COMMUNITY): Payer: Self-pay

## 2023-03-04 ENCOUNTER — Other Ambulatory Visit: Payer: Self-pay

## 2023-03-10 DIAGNOSIS — M7061 Trochanteric bursitis, right hip: Secondary | ICD-10-CM | POA: Diagnosis not present

## 2023-03-12 DIAGNOSIS — M25551 Pain in right hip: Secondary | ICD-10-CM | POA: Diagnosis not present

## 2023-03-12 DIAGNOSIS — R262 Difficulty in walking, not elsewhere classified: Secondary | ICD-10-CM | POA: Diagnosis not present

## 2023-03-12 DIAGNOSIS — M6281 Muscle weakness (generalized): Secondary | ICD-10-CM | POA: Diagnosis not present

## 2023-03-13 DIAGNOSIS — M25551 Pain in right hip: Secondary | ICD-10-CM | POA: Diagnosis not present

## 2023-03-13 DIAGNOSIS — M6281 Muscle weakness (generalized): Secondary | ICD-10-CM | POA: Diagnosis not present

## 2023-03-13 DIAGNOSIS — R262 Difficulty in walking, not elsewhere classified: Secondary | ICD-10-CM | POA: Diagnosis not present

## 2023-03-20 DIAGNOSIS — R262 Difficulty in walking, not elsewhere classified: Secondary | ICD-10-CM | POA: Diagnosis not present

## 2023-03-20 DIAGNOSIS — M6281 Muscle weakness (generalized): Secondary | ICD-10-CM | POA: Diagnosis not present

## 2023-03-20 DIAGNOSIS — M25551 Pain in right hip: Secondary | ICD-10-CM | POA: Diagnosis not present

## 2023-03-24 DIAGNOSIS — M6281 Muscle weakness (generalized): Secondary | ICD-10-CM | POA: Diagnosis not present

## 2023-03-24 DIAGNOSIS — M25551 Pain in right hip: Secondary | ICD-10-CM | POA: Diagnosis not present

## 2023-03-24 DIAGNOSIS — R262 Difficulty in walking, not elsewhere classified: Secondary | ICD-10-CM | POA: Diagnosis not present

## 2023-03-25 ENCOUNTER — Encounter: Payer: Self-pay | Admitting: *Deleted

## 2023-03-27 DIAGNOSIS — M25551 Pain in right hip: Secondary | ICD-10-CM | POA: Diagnosis not present

## 2023-03-27 DIAGNOSIS — R262 Difficulty in walking, not elsewhere classified: Secondary | ICD-10-CM | POA: Diagnosis not present

## 2023-03-27 DIAGNOSIS — M6281 Muscle weakness (generalized): Secondary | ICD-10-CM | POA: Diagnosis not present

## 2023-03-31 DIAGNOSIS — M6281 Muscle weakness (generalized): Secondary | ICD-10-CM | POA: Diagnosis not present

## 2023-03-31 DIAGNOSIS — M25551 Pain in right hip: Secondary | ICD-10-CM | POA: Diagnosis not present

## 2023-03-31 DIAGNOSIS — R262 Difficulty in walking, not elsewhere classified: Secondary | ICD-10-CM | POA: Diagnosis not present

## 2023-04-02 DIAGNOSIS — M6281 Muscle weakness (generalized): Secondary | ICD-10-CM | POA: Diagnosis not present

## 2023-04-02 DIAGNOSIS — R262 Difficulty in walking, not elsewhere classified: Secondary | ICD-10-CM | POA: Diagnosis not present

## 2023-04-02 DIAGNOSIS — M25551 Pain in right hip: Secondary | ICD-10-CM | POA: Diagnosis not present

## 2023-04-08 DIAGNOSIS — M25551 Pain in right hip: Secondary | ICD-10-CM | POA: Diagnosis not present

## 2023-04-08 DIAGNOSIS — R262 Difficulty in walking, not elsewhere classified: Secondary | ICD-10-CM | POA: Diagnosis not present

## 2023-04-08 DIAGNOSIS — M6281 Muscle weakness (generalized): Secondary | ICD-10-CM | POA: Diagnosis not present

## 2023-04-09 ENCOUNTER — Other Ambulatory Visit: Payer: Self-pay

## 2023-04-09 ENCOUNTER — Encounter: Payer: Self-pay | Admitting: Dietician

## 2023-04-09 DIAGNOSIS — M7061 Trochanteric bursitis, right hip: Secondary | ICD-10-CM | POA: Diagnosis not present

## 2023-04-09 DIAGNOSIS — M25551 Pain in right hip: Secondary | ICD-10-CM | POA: Diagnosis not present

## 2023-04-09 DIAGNOSIS — R262 Difficulty in walking, not elsewhere classified: Secondary | ICD-10-CM | POA: Diagnosis not present

## 2023-04-09 DIAGNOSIS — M6281 Muscle weakness (generalized): Secondary | ICD-10-CM | POA: Diagnosis not present

## 2023-04-09 NOTE — Progress Notes (Signed)
 Specialty Pharmacy Refill Coordination Note  Darryl [name] is a 73 y.o. male contacted today regarding refills of specialty medication(s) Ustekinumab  (Stelara )   Patient requested Cranston Dk at [facility] at [place] date: 04/14/23   Medication will be filled on 01.17.25.

## 2023-04-11 ENCOUNTER — Other Ambulatory Visit: Payer: Self-pay

## 2023-04-14 DIAGNOSIS — M25551 Pain in right hip: Secondary | ICD-10-CM | POA: Diagnosis not present

## 2023-04-14 DIAGNOSIS — M6281 Muscle weakness (generalized): Secondary | ICD-10-CM | POA: Diagnosis not present

## 2023-04-14 DIAGNOSIS — R262 Difficulty in walking, not elsewhere classified: Secondary | ICD-10-CM | POA: Diagnosis not present

## 2023-04-15 ENCOUNTER — Other Ambulatory Visit (HOSPITAL_COMMUNITY): Payer: Self-pay

## 2023-04-17 DIAGNOSIS — M6281 Muscle weakness (generalized): Secondary | ICD-10-CM | POA: Diagnosis not present

## 2023-04-17 DIAGNOSIS — M25551 Pain in right hip: Secondary | ICD-10-CM | POA: Diagnosis not present

## 2023-04-17 DIAGNOSIS — R262 Difficulty in walking, not elsewhere classified: Secondary | ICD-10-CM | POA: Diagnosis not present

## 2023-04-23 ENCOUNTER — Telehealth: Payer: Self-pay | Admitting: Internal Medicine

## 2023-04-23 NOTE — Telephone Encounter (Signed)
Patient called stated he had the Flu and severe diarrhea took two Imodium tablets and it stop however he still feels bloated.

## 2023-04-23 NOTE — Telephone Encounter (Signed)
Spoke with pt and he reports he had a virus over the weekend and he made it worse by eating a lot of food and getting "clogged up." Pt reports he is beginning to feel better and will call back if he has any other issues.

## 2023-04-25 DIAGNOSIS — M6281 Muscle weakness (generalized): Secondary | ICD-10-CM | POA: Diagnosis not present

## 2023-04-25 DIAGNOSIS — R262 Difficulty in walking, not elsewhere classified: Secondary | ICD-10-CM | POA: Diagnosis not present

## 2023-04-25 DIAGNOSIS — M25551 Pain in right hip: Secondary | ICD-10-CM | POA: Diagnosis not present

## 2023-04-28 ENCOUNTER — Other Ambulatory Visit: Payer: Self-pay | Admitting: Cardiology

## 2023-04-28 ENCOUNTER — Other Ambulatory Visit: Payer: Self-pay | Admitting: Student in an Organized Health Care Education/Training Program

## 2023-04-28 DIAGNOSIS — R262 Difficulty in walking, not elsewhere classified: Secondary | ICD-10-CM | POA: Diagnosis not present

## 2023-04-28 DIAGNOSIS — L858 Other specified epidermal thickening: Secondary | ICD-10-CM | POA: Diagnosis not present

## 2023-04-28 DIAGNOSIS — E781 Pure hyperglyceridemia: Secondary | ICD-10-CM

## 2023-04-28 DIAGNOSIS — M25551 Pain in right hip: Secondary | ICD-10-CM | POA: Diagnosis not present

## 2023-04-28 DIAGNOSIS — I2510 Atherosclerotic heart disease of native coronary artery without angina pectoris: Secondary | ICD-10-CM

## 2023-04-28 DIAGNOSIS — Z85828 Personal history of other malignant neoplasm of skin: Secondary | ICD-10-CM | POA: Diagnosis not present

## 2023-04-28 DIAGNOSIS — M6281 Muscle weakness (generalized): Secondary | ICD-10-CM | POA: Diagnosis not present

## 2023-04-29 DIAGNOSIS — M6281 Muscle weakness (generalized): Secondary | ICD-10-CM | POA: Diagnosis not present

## 2023-04-29 DIAGNOSIS — M25551 Pain in right hip: Secondary | ICD-10-CM | POA: Diagnosis not present

## 2023-04-29 DIAGNOSIS — R262 Difficulty in walking, not elsewhere classified: Secondary | ICD-10-CM | POA: Diagnosis not present

## 2023-05-05 DIAGNOSIS — M6281 Muscle weakness (generalized): Secondary | ICD-10-CM | POA: Diagnosis not present

## 2023-05-05 DIAGNOSIS — R262 Difficulty in walking, not elsewhere classified: Secondary | ICD-10-CM | POA: Diagnosis not present

## 2023-05-05 DIAGNOSIS — M25551 Pain in right hip: Secondary | ICD-10-CM | POA: Diagnosis not present

## 2023-05-07 DIAGNOSIS — M6281 Muscle weakness (generalized): Secondary | ICD-10-CM | POA: Diagnosis not present

## 2023-05-07 DIAGNOSIS — M25551 Pain in right hip: Secondary | ICD-10-CM | POA: Diagnosis not present

## 2023-05-07 DIAGNOSIS — R262 Difficulty in walking, not elsewhere classified: Secondary | ICD-10-CM | POA: Diagnosis not present

## 2023-05-14 DIAGNOSIS — M25551 Pain in right hip: Secondary | ICD-10-CM | POA: Diagnosis not present

## 2023-05-14 DIAGNOSIS — M6281 Muscle weakness (generalized): Secondary | ICD-10-CM | POA: Diagnosis not present

## 2023-05-14 DIAGNOSIS — R262 Difficulty in walking, not elsewhere classified: Secondary | ICD-10-CM | POA: Diagnosis not present

## 2023-05-16 DIAGNOSIS — M25551 Pain in right hip: Secondary | ICD-10-CM | POA: Diagnosis not present

## 2023-05-16 DIAGNOSIS — R262 Difficulty in walking, not elsewhere classified: Secondary | ICD-10-CM | POA: Diagnosis not present

## 2023-05-16 DIAGNOSIS — M6281 Muscle weakness (generalized): Secondary | ICD-10-CM | POA: Diagnosis not present

## 2023-05-19 DIAGNOSIS — M25551 Pain in right hip: Secondary | ICD-10-CM | POA: Diagnosis not present

## 2023-05-19 DIAGNOSIS — R262 Difficulty in walking, not elsewhere classified: Secondary | ICD-10-CM | POA: Diagnosis not present

## 2023-05-19 DIAGNOSIS — M6281 Muscle weakness (generalized): Secondary | ICD-10-CM | POA: Diagnosis not present

## 2023-05-20 ENCOUNTER — Other Ambulatory Visit: Payer: Self-pay

## 2023-05-20 ENCOUNTER — Other Ambulatory Visit: Payer: Self-pay | Admitting: Pharmacy Technician

## 2023-05-20 NOTE — Progress Notes (Signed)
 Specialty Pharmacy Refill Coordination Note  Darryl [name] is a 73 y.o. male contacted today regarding refills of specialty medication(s) Ustekinumab [name])   Patient requested [name] at [facility] at [place] date: 05/23/23   Medication will be filled on 05/22/23.

## 2023-05-22 ENCOUNTER — Other Ambulatory Visit: Payer: Self-pay

## 2023-05-23 DIAGNOSIS — R262 Difficulty in walking, not elsewhere classified: Secondary | ICD-10-CM | POA: Diagnosis not present

## 2023-05-23 DIAGNOSIS — M25551 Pain in right hip: Secondary | ICD-10-CM | POA: Diagnosis not present

## 2023-05-23 DIAGNOSIS — M6281 Muscle weakness (generalized): Secondary | ICD-10-CM | POA: Diagnosis not present

## 2023-05-27 ENCOUNTER — Other Ambulatory Visit (HOSPITAL_COMMUNITY): Payer: Self-pay

## 2023-06-18 ENCOUNTER — Other Ambulatory Visit: Payer: Self-pay

## 2023-06-23 ENCOUNTER — Other Ambulatory Visit (HOSPITAL_COMMUNITY): Payer: Self-pay

## 2023-06-23 ENCOUNTER — Ambulatory Visit: Payer: Medicare PPO | Attending: Cardiology | Admitting: Cardiology

## 2023-06-23 ENCOUNTER — Encounter: Payer: Self-pay | Admitting: Cardiology

## 2023-06-23 ENCOUNTER — Other Ambulatory Visit: Payer: Self-pay

## 2023-06-23 VITALS — BP 120/84 | HR 67 | Ht 66.0 in | Wt 183.2 lb

## 2023-06-23 DIAGNOSIS — I2510 Atherosclerotic heart disease of native coronary artery without angina pectoris: Secondary | ICD-10-CM

## 2023-06-23 DIAGNOSIS — J4521 Mild intermittent asthma with (acute) exacerbation: Secondary | ICD-10-CM

## 2023-06-23 DIAGNOSIS — I10 Essential (primary) hypertension: Secondary | ICD-10-CM

## 2023-06-23 DIAGNOSIS — E1169 Type 2 diabetes mellitus with other specified complication: Secondary | ICD-10-CM

## 2023-06-23 DIAGNOSIS — F5103 Paradoxical insomnia: Secondary | ICD-10-CM | POA: Diagnosis not present

## 2023-06-23 DIAGNOSIS — Z9861 Coronary angioplasty status: Secondary | ICD-10-CM | POA: Diagnosis not present

## 2023-06-23 DIAGNOSIS — L409 Psoriasis, unspecified: Secondary | ICD-10-CM

## 2023-06-23 DIAGNOSIS — K50918 Crohn's disease, unspecified, with other complication: Secondary | ICD-10-CM | POA: Diagnosis not present

## 2023-06-23 DIAGNOSIS — I255 Ischemic cardiomyopathy: Secondary | ICD-10-CM | POA: Diagnosis not present

## 2023-06-23 DIAGNOSIS — E785 Hyperlipidemia, unspecified: Secondary | ICD-10-CM

## 2023-06-23 MED ORDER — ISOSORBIDE MONONITRATE ER 30 MG PO TB24
30.0000 mg | ORAL_TABLET | Freq: Every day | ORAL | 3 refills | Status: DC
Start: 2023-06-23 — End: 2024-01-07

## 2023-06-23 NOTE — Progress Notes (Signed)
 Specialty Pharmacy Refill Coordination Note  Darryl [name] is a 72 y.o. male contacted today regarding refills of specialty medication(s) Ustekinumab [name])   Patient requested [name] at [facility] at [facility] date: 06/26/23 (or 04/04.)   Medication will be filled on 06/26/23. Pt is aware that medication will be ready for pick up after lunch time on 04/03.

## 2023-06-23 NOTE — Patient Instructions (Addendum)
 Medication Instructions:    Start taking Isosorbide mono ( Imdur) 30 mg at bedtime   *If you need a refill on your cardiac medications before your next appointment, please call your pharmacy*   Lab Work: Not needed    Testing/Procedures:  Not needed  Follow-Up: At [facility], you and your health needs are our priority.  As part of our continuing mission to provide you with exceptional heart care, we have created designated Provider Care Teams.  These Care Teams include your primary Cardiologist (physician) and Advanced Practice Providers (APPs -  Physician Assistants and Nurse Practitioners) who all work together to provide you with the care you need, when you need it.     Your next appointment:   6 month(s)  The format for your next appointment:   In Person  Provider:   Bryan Lemma, MD    Other Instructions   s

## 2023-06-23 NOTE — Progress Notes (Unsigned)
 Cardiology Office Note:  .   Date:  06/25/2023  ID:  Darryl [name], DOB December 31, 1950, MRN [id number] PCP: Darryl Alias, MD  [facility] Providers Cardiologist:  Bryan Lemma, MD     Chief Complaint  Patient presents with   Follow-up    Overall doing fairly well.   Coronary Artery Disease    Doing fine.  Occasionally has chest discomfort.  Very rare.    Patient Profile: .     Darryl [name] is a 73 y.o. male  with a Cardiovascular past Medical History reviewed below who presents here for annual follow-up at the request of Darryl [name],*.  CV PMH: Inferior STEMI 07/13/2018: RCA PCI staged PCI-MRI.  Mid LAD CTO Mild ICM (EF estimated 40 to 45% with distal septal apical akinesis) -> post MI LV thrombus resolved by January 2021 GDMT previous limited by chronic orthostatic hypotension HLD Chronic colitis     Darryl [name] was last seen in March 2024  Subjective  Discussed the use of AI scribe software for clinical note transcription with the patient, who gave verbal consent to proceed.  History of Present Illness   Darryl [name] "Sarp Vernier" is a 73 year old male with coronary artery disease who presents with chest tightness.  He experiences chest pressure and tightness primarily at night and occasionally in the morning upon waking. These episodes last for a few minutes and resolve on his own. No associated difficulty breathing, heart racing, or palpitations. He has not been exerting himself much but recalls feeling tired when he used to play pickleball. He currently bowls every Tuesday night without any issues. He has noticed the chest tightness about three times in the last three to four weeks, approximately once a week.  He has a history of coronary artery disease and is currently taking several medications including Plavix 75 mg, Farxiga, Avapro (irbesartan) 37.5 mg, Toprol (metoprolol) 25 mg, Crestor 40 mg, and Vascepa 1 gram (two tablets twice a  day). He takes Toprol at night and irbesartan in the morning. No new medications for diabetes aside from [place].  His last cholesterol check showed a total cholesterol of 165, triglycerides at 207, LDL at 73, and HDL at 58. His A1c was 6.7 in September of the previous year, indicating diabetes. His creatinine was 0.99 and potassium was 4.4, both within normal limits. He is due for a cholesterol check with his primary care physician.  He reports issues with sleep, describing difficulty falling asleep and staying asleep, with his mind racing at night. No shortness of breath or requirement for extra pillows to sleep. He occasionally snores, as noted by his partner, but does not wake up gasping for air.  His Crohn's disease is currently stable, managed with Stelara, and he is also on Singulair (montelukast) for reactive airway disease and soriatane (acitretinoin) for skin issues. The soriatane has effectively managed his skin condition, which previously presented as 'quilts on my legs'.         Objective   Medications - Plavix 75 mg daily - Farxiga 10 mg daily - Avapro 75 mg tab (1/2 tablet) - 37.5 mg daily - Toprol 25 mg daily - - Crestor 40 mg daily - - Vascepa 1 g (2 tabs twice a day) - Stelara - Singulair 10 mg daily -  - Advair 115-21 mcg/ACt  2 puff BID - PRN Albuterol INH (Ventolin) - acitretin 10 mg daily  Studies Reviewed: Marland Kitchen   EKG Interpretation Date/Time:  Monday June 23 2023 10:11:36 EDT Ventricular Rate:  67 PR Interval:  178 QRS Duration:  98 QT Interval:  406 QTC Calculation: 429 R Axis:   47  Text Interpretation: Normal sinus rhythm Low voltage QRS Cannot rule out Anterior infarct (cited on or before 23-Jul-2018) When compared with ECG of 05/2022 No significant change since last tracing Confirmed by Bryan Lemma ([id number]) on 06/23/2023 10:37:49 AM   Results LABS Total cholesterol: 165 mg/dL (98/01/9146) Triglycerides: 207 mg/dL (82/95/6213) LDL: 73 mg/dL  (08/65/7846) HDL: 58 mg/dL (96/29/5284) X3K: 4.4% (11/2022) Creatinine: 0.99 mg/dL (03/27/7251) Potassium: 4.4 mmol/L (07/01/2022)  No new studies  TTE 04/06/2019: LVEF 40-45% distal septal and apical akinesis.  GR 1 DD.  Mild LA dilation.  Normal RV.  Mild AOV sclerosis with no stenosis.  Normal RAP. Cardiac Cath 06/2018-before, and after staged PCI 07/24/2018: for Inferior STEMI Initial PCI: p-mRCA 100%:  p-mRCA 100% (DES PCI - p-mRCA 100% (DES PCI - Resolute Onyx 3.5 x 26 (post-dilated ~3.7 mm) ; dRCA ~50% & PAV 40%; pRI 85% (Staged PCI). 100% LAD CTO after small D1 with ost-prox 60%. R-L collaterals to LAD seen post PCI.      Staged PCI pRI 85-90%: Resolute Onyx DES 2.25 x 15 (2.5 mm)       Risk Assessment/Calculations:           Physical Exam:   VS:  BP 120/84 (BP Location: Right Arm, Patient Position: Sitting, Cuff Size: Normal)   Pulse 67   Ht 5\' 6"  (1.676 m)   Wt 183 lb 3.2 oz (83.1 kg)   SpO2 95%   BMI 29.57 kg/m    Wt Readings from Last 3 Encounters:  06/23/23 183 lb 3.2 oz (83.1 kg)  01/29/23 182 lb (82.6 kg)  01/28/23 182 lb 4.8 oz (82.7 kg)    GEN: Healthy-Appearing.  Well-Groomed.  Well-Nourished.  NAD NECK: No JVD; No carotid bruits CARDIAC: RRR; Distant, but normal S1, S2; no murmurs, rubs, gallops RESPIRATORY:  Clear to auscultation without rales, wheezing or rhonchi ; nonlabored, good air movement. ABDOMEN: Soft, non-tender, non-distended EXTREMITIES:  No edema; No deformity      ASSESSMENT AND PLAN: .    Problem List Items Addressed This Visit       Cardiology Problems   CAD S/P percutaneous coronary angioplasty (Chronic)   Significant three-vessel disease with stents in the RCA and RI-pretty long stents. Plan for Plavix in the 5 mg daily monotherapy Okay to hold Plavix 5 to 7 days preop for surgical procedures      Relevant Medications   isosorbide mononitrate (IMDUR) 30 MG 24 hr tablet   Other Relevant Orders   EKG 12-Lead (Completed)    Coronary artery disease involving native coronary artery of native heart without angina pectoris - Primary (Chronic)   He experiences intermittent chest pressure, particularly at night and in the morning, resolving after a few minutes without dyspnea or palpitations. He has an occluded left anterior descending (LAD) artery. Symptoms may be due to angina. Blood pressure is well-controlled with no evidence of new blockages. - Prescribe long-acting nitrate to be taken at night with Toprol 25 mg daily Add Imdur 30 mg daily - Continue Plavix monotherapy-75 mg daily Okay to hold Plavix 5 to 7 days preop for surgeries or procedures. -Continue irbesartan 37.5 mg every morning - Continue rosuvastatin 40 mg daily along with Vascepa 2 g twice daily - Follow up in 6 months to assess symptom control      Relevant  Medications   isosorbide mononitrate (IMDUR) 30 MG 24 hr tablet   Other Relevant Orders   EKG 12-Lead (Completed)   Essential hypertension (Chronic)   Blood pressure is well-controlled on Avapro 37.5 mg every morning and Toprol 25 mg nightly - Continue current antihypertensive regimen      Relevant Medications   isosorbide mononitrate (IMDUR) 30 MG 24 hr tablet   Hyperlipidemia associated with type 2 diabetes mellitus (HCC) (Chronic)   Hyperlipidemia Cholesterol levels are near target but not optimal. Last LDL was 73 mg/dL, target is 55 mg/dL. He is on Crestor 40 mg daily and Vascepa 1 g capsules (2 capsules twice daily).  Primary care physician monitors cholesterol levels. - Ensure primary care physician checks cholesterol levels during annual visit  Type 2 Diabetes Mellitus Last HbA1c was 6.7%. Managed with [place]. Primary care physician monitors HbA1c levels. - Ensure primary care physician checks HbA1c during annual visit      Relevant Medications   isosorbide mononitrate (IMDUR) 30 MG 24 hr tablet   Ischemic cardiomyopathy (Chronic)   Mildly reduced EF on echo due to occluded  LAD, but stable NYHA class I CHF symptoms.  Euvolemic on exam. -GDMT: Toprol-XL 25 mg nightly, Avapro 37.5 mg every morning; Farxiga 10 mg daily -> Euvolemic therefore no requirement for loop diuretic, and BP will not tolerate spironolactone.      Relevant Medications   isosorbide mononitrate (IMDUR) 30 MG 24 hr tablet   Other Relevant Orders   EKG 12-Lead (Completed)     Other   Asthma (Chronic)   Reactive Airway Disease Condition is well-managed on Singulair and Advair. - Continue current medications (Singulair and Advair)      Crohn's disease (HCC) (Chronic)   Condition is well-managed on Stelara. Regular blood work is conducted to monitor the condition. - Continue current Stelara regimen - Ensure regular blood work as per dermatologist's recommendation      Insomnia   He reports difficulty falling asleep and staying asleep, leading to daytime fatigue. No indication of sleep apnea. Insomnia likely related to racing thoughts. - Consider cognitive behavioral therapy for insomnia (CBT-I) - Discuss sleep hygiene techniques      Psoriasis   Psoriasis is effectively managed with Soriatane. Switched from Humira to [facility] for better compatibility with Soriatane. - Continue Soriatane as prescribed by dermatologist        Follow-Up: Return in about 6 months (around 12/23/2023) for 6 month follow-up with me, [employer].     Signed, Marykay Lex, MD, MS Bryan Lemma, M.D., M.S. Interventional Cardiologist  [facility]  Pager # [phone] Phone # [phone] [address] [place], [place] [address]

## 2023-06-25 ENCOUNTER — Other Ambulatory Visit: Payer: Self-pay | Admitting: Student in an Organized Health Care Education/Training Program

## 2023-06-25 ENCOUNTER — Encounter: Payer: Self-pay | Admitting: Cardiology

## 2023-06-25 DIAGNOSIS — J4521 Mild intermittent asthma with (acute) exacerbation: Secondary | ICD-10-CM

## 2023-06-25 DIAGNOSIS — L409 Psoriasis, unspecified: Secondary | ICD-10-CM | POA: Insufficient documentation

## 2023-06-25 DIAGNOSIS — G4700 Insomnia, unspecified: Secondary | ICD-10-CM | POA: Insufficient documentation

## 2023-06-25 MED ORDER — MONTELUKAST SODIUM 10 MG PO TABS: 10.0000 mg | ORAL_TABLET | Freq: Every day | ORAL | 3 refills | Status: AC

## 2023-06-25 NOTE — Assessment & Plan Note (Signed)
 Condition is well-managed on Stelara. Regular blood work is conducted to monitor the condition. - Continue current Stelara regimen - Ensure regular blood work as per dermatologist's recommendation

## 2023-06-25 NOTE — Telephone Encounter (Signed)
 NO LONGER [facility] PATIENT OR PROVIDER

## 2023-06-25 NOTE — Assessment & Plan Note (Signed)
 Mildly reduced EF on echo due to occluded LAD, but stable NYHA class I CHF symptoms.  Euvolemic on exam. -GDMT: Toprol-XL 25 mg nightly, Avapro 37.5 mg every morning; Farxiga 10 mg daily -> Euvolemic therefore no requirement for loop diuretic, and BP will not tolerate spironolactone.

## 2023-06-25 NOTE — Assessment & Plan Note (Signed)
 He experiences intermittent chest pressure, particularly at night and in the morning, resolving after a few minutes without dyspnea or palpitations. He has an occluded left anterior descending (LAD) artery. Symptoms may be due to angina. Blood pressure is well-controlled with no evidence of new blockages. - Prescribe long-acting nitrate to be taken at night with Toprol 25 mg daily Add Imdur 30 mg daily - Continue Plavix monotherapy-75 mg daily Okay to hold Plavix 5 to 7 days preop for surgeries or procedures. -Continue irbesartan 37.5 mg every morning - Continue rosuvastatin 40 mg daily along with Vascepa 2 g twice daily - Follow up in 6 months to assess symptom control

## 2023-06-25 NOTE — Assessment & Plan Note (Signed)
 Hyperlipidemia Cholesterol levels are near target but not optimal. Last LDL was 73 mg/dL, target is 55 mg/dL. He is on Crestor 40 mg daily and Vascepa 1 g capsules (2 capsules twice daily).  Primary care physician monitors cholesterol levels. - Ensure primary care physician checks cholesterol levels during annual visit  Type 2 Diabetes Mellitus Last HbA1c was 6.7%. Managed with [place]. Primary care physician monitors HbA1c levels. - Ensure primary care physician checks HbA1c during annual visit

## 2023-06-25 NOTE — Assessment & Plan Note (Signed)
 Reactive Airway Disease Condition is well-managed on Singulair and Advair. - Continue current medications (Singulair and Advair)

## 2023-06-25 NOTE — Assessment & Plan Note (Signed)
 Blood pressure is well-controlled on Avapro 37.5 mg every morning and Toprol 25 mg nightly - Continue current antihypertensive regimen

## 2023-06-25 NOTE — Assessment & Plan Note (Signed)
 Psoriasis is effectively managed with Soriatane. Switched from Humira to [facility] for better compatibility with Soriatane. - Continue Soriatane as prescribed by dermatologist

## 2023-06-25 NOTE — Assessment & Plan Note (Signed)
 Significant three-vessel disease with stents in the RCA and RI-pretty long stents. Plan for Plavix in the 5 mg daily monotherapy Okay to hold Plavix 5 to 7 days preop for surgical procedures

## 2023-06-25 NOTE — Assessment & Plan Note (Signed)
 He reports difficulty falling asleep and staying asleep, leading to daytime fatigue. No indication of sleep apnea. Insomnia likely related to racing thoughts. - Consider cognitive behavioral therapy for insomnia (CBT-I) - Discuss sleep hygiene techniques

## 2023-06-26 ENCOUNTER — Other Ambulatory Visit (HOSPITAL_COMMUNITY): Payer: Self-pay

## 2023-06-26 ENCOUNTER — Other Ambulatory Visit: Payer: Self-pay

## 2023-07-02 ENCOUNTER — Other Ambulatory Visit: Payer: Self-pay | Admitting: Student in an Organized Health Care Education/Training Program

## 2023-07-02 ENCOUNTER — Other Ambulatory Visit: Payer: Self-pay | Admitting: Cardiology

## 2023-07-02 DIAGNOSIS — I10 Essential (primary) hypertension: Secondary | ICD-10-CM

## 2023-07-02 DIAGNOSIS — E118 Type 2 diabetes mellitus with unspecified complications: Secondary | ICD-10-CM

## 2023-07-02 DIAGNOSIS — E785 Hyperlipidemia, unspecified: Secondary | ICD-10-CM

## 2023-07-04 ENCOUNTER — Other Ambulatory Visit: Payer: Self-pay | Admitting: Student in an Organized Health Care Education/Training Program

## 2023-07-04 DIAGNOSIS — I10 Essential (primary) hypertension: Secondary | ICD-10-CM

## 2023-07-04 DIAGNOSIS — E118 Type 2 diabetes mellitus with unspecified complications: Secondary | ICD-10-CM

## 2023-07-04 NOTE — Telephone Encounter (Signed)
 Copied from CRM [id number]. Topic: Clinical - Medication Refill >> Jul 04, 2023 12:34 PM Sim Boast F wrote: Most Recent Primary Care Visit:  Provider: Wyvonne Lenz  Department: [facility]  Visit Type: MEDICARE AWV, SEQUENTIAL  Date: 01/29/2023  Medication: irbesartan   Has the patient contacted their pharmacy? Yes (Agent: If no, request that the patient contact the pharmacy for the refill. If patient does not wish to contact the pharmacy document the reason why and proceed with request.) (Agent: If yes, when and what did the pharmacy advise?)  Is this the correct pharmacy for this prescription? Yes If no, delete pharmacy and type the correct one.  This is the patient's preferred pharmacy:   [facility] [place], [place] - [address] [facility] [address] [place] [address] Phone: [phone] Fax: [phone]    Has the prescription been filled recently? Yes  Is the patient out of the medication? Yes  Has the patient been seen for an appointment in the last year OR does the patient have an upcoming appointment? Yes  Can we respond through MyChart? Yes  Agent: Please be advised that Rx refills may take up to 3 business days. We ask that you follow-up with your pharmacy.

## 2023-07-07 MED ORDER — IRBESARTAN 75 MG PO TABS
37.5000 mg | ORAL_TABLET | Freq: Every day | ORAL | 3 refills | Status: AC
Start: 2023-07-07 — End: ?

## 2023-07-07 NOTE — Addendum Note (Signed)
 Addended by: Juel Nutley T on: 07/07/2023 07:53 AM   Modules accepted: Orders

## 2023-07-08 ENCOUNTER — Other Ambulatory Visit: Payer: Self-pay | Admitting: Student in an Organized Health Care Education/Training Program

## 2023-07-08 DIAGNOSIS — E118 Type 2 diabetes mellitus with unspecified complications: Secondary | ICD-10-CM

## 2023-07-08 DIAGNOSIS — N1831 Chronic kidney disease, stage 3a: Secondary | ICD-10-CM

## 2023-07-08 DIAGNOSIS — K219 Gastro-esophageal reflux disease without esophagitis: Secondary | ICD-10-CM

## 2023-07-08 NOTE — Telephone Encounter (Signed)
 NO LONGER [facility] PATIENT OR PROVIDER

## 2023-07-10 ENCOUNTER — Other Ambulatory Visit: Payer: Self-pay | Admitting: Student in an Organized Health Care Education/Training Program

## 2023-07-10 DIAGNOSIS — K219 Gastro-esophageal reflux disease without esophagitis: Secondary | ICD-10-CM

## 2023-07-10 NOTE — Telephone Encounter (Signed)
 Copied from CRM [id number]. Topic: Clinical - Medication Refill >> Jul 10, 2023 12:11 PM Marlan Silva wrote: Most Recent Primary Care Visit:  Provider: Gary Kapur  Department: [facility]  Visit Type: MEDICARE AWV, SEQUENTIAL  Date: 01/29/2023  Medication: pantoprazole (PROTONIX) 40 MG tablet  Has the patient contacted their pharmacy? Yes (Agent: If no, request that the patient contact the pharmacy for the refill. If patient does not wish to contact the pharmacy document the reason why and proceed with request.) (Agent: If yes, when and what did the pharmacy advise?)  Is this the correct pharmacy for this prescription? Yes If no, delete pharmacy and type the correct one.  This is the patient's preferred pharmacy:  [facility] [place], [place] - [address] [facility] [address] [place] [address] Phone: [phone] Fax: [phone]   Has the prescription been filled recently? No  Is the patient out of the medication? Yes  Has the patient been seen for an appointment in the last year OR does the patient have an upcoming appointment? Yes  Can we respond through MyChart? Yes  Agent: Please be advised that Rx refills may take up to 3 business days. We ask that you follow-up with your pharmacy.

## 2023-07-11 ENCOUNTER — Other Ambulatory Visit: Payer: Self-pay | Admitting: Student in an Organized Health Care Education/Training Program

## 2023-07-11 DIAGNOSIS — K219 Gastro-esophageal reflux disease without esophagitis: Secondary | ICD-10-CM

## 2023-07-11 DIAGNOSIS — N1831 Chronic kidney disease, stage 3a: Secondary | ICD-10-CM

## 2023-07-11 DIAGNOSIS — E118 Type 2 diabetes mellitus with unspecified complications: Secondary | ICD-10-CM

## 2023-07-16 ENCOUNTER — Encounter: Payer: Self-pay | Admitting: Student in an Organized Health Care Education/Training Program

## 2023-07-16 ENCOUNTER — Ambulatory Visit: Admitting: Student in an Organized Health Care Education/Training Program

## 2023-07-16 VITALS — BP 120/78 | HR 67 | Wt 184.0 lb

## 2023-07-16 DIAGNOSIS — K50918 Crohn's disease, unspecified, with other complication: Secondary | ICD-10-CM | POA: Diagnosis not present

## 2023-07-16 DIAGNOSIS — J4521 Mild intermittent asthma with (acute) exacerbation: Secondary | ICD-10-CM

## 2023-07-16 DIAGNOSIS — Z7984 Long term (current) use of oral hypoglycemic drugs: Secondary | ICD-10-CM

## 2023-07-16 DIAGNOSIS — I10 Essential (primary) hypertension: Secondary | ICD-10-CM

## 2023-07-16 DIAGNOSIS — F5103 Paradoxical insomnia: Secondary | ICD-10-CM | POA: Diagnosis not present

## 2023-07-16 DIAGNOSIS — I2510 Atherosclerotic heart disease of native coronary artery without angina pectoris: Secondary | ICD-10-CM | POA: Diagnosis not present

## 2023-07-16 DIAGNOSIS — L409 Psoriasis, unspecified: Secondary | ICD-10-CM | POA: Diagnosis not present

## 2023-07-16 DIAGNOSIS — E118 Type 2 diabetes mellitus with unspecified complications: Secondary | ICD-10-CM

## 2023-07-16 LAB — COMPREHENSIVE METABOLIC PANEL WITH GFR
ALT: 30 U/L (ref 0–53)
AST: 26 U/L (ref 0–37)
Albumin: 4.4 g/dL (ref 3.5–5.2)
Alkaline Phosphatase: 82 U/L (ref 39–117)
BUN: 16 mg/dL (ref 6–23)
CO2: 27 meq/L (ref 19–32)
Calcium: 9.7 mg/dL (ref 8.4–10.5)
Chloride: 104 meq/L (ref 96–112)
Creatinine, Ser: 1.1 mg/dL (ref 0.40–1.50)
GFR: 66.37 mL/min (ref >60.00)
Glucose, Bld: 162 mg/dL — ABNORMAL HIGH (ref 70–99)
Potassium: 4.8 meq/L (ref 3.5–5.1)
Sodium: 138 meq/L (ref 135–145)
Total Bilirubin: 0.9 mg/dL (ref 0.2–1.2)
Total Protein: 6.9 g/dL (ref 6.0–8.3)

## 2023-07-16 LAB — LIPID PANEL
Cholesterol: 142 mg/dL (ref 0–200)
HDL: 51.7 mg/dL (ref >39.00)
LDL Cholesterol: 57 mg/dL (ref 0–99)
NonHDL: 89.91
Total CHOL/HDL Ratio: 3
Triglycerides: 166 mg/dL — ABNORMAL HIGH (ref 0.0–149.0)
VLDL: 33.2 mg/dL (ref 0.0–40.0)

## 2023-07-16 LAB — CBC WITH DIFFERENTIAL/PLATELET
Basophils Absolute: 0 10*3/uL (ref 0.0–0.1)
Basophils Relative: 0.6 % (ref 0.0–3.0)
Eosinophils Absolute: 0 10*3/uL (ref 0.0–0.7)
Eosinophils Relative: 0.5 % (ref 0.0–5.0)
HCT: 48.7 % (ref 39.0–52.0)
Hemoglobin: 16.5 g/dL (ref 13.0–17.0)
Lymphocytes Relative: 24.7 % (ref 12.0–46.0)
Lymphs Abs: 1.6 10*3/uL (ref 0.7–4.0)
MCHC: 33.8 g/dL (ref 30.0–36.0)
MCV: 94.9 fl (ref 78.0–100.0)
Monocytes Absolute: 0.8 10*3/uL (ref 0.1–1.0)
Monocytes Relative: 12.1 % — ABNORMAL HIGH (ref 3.0–12.0)
Neutro Abs: 3.9 10*3/uL (ref 1.4–7.7)
Neutrophils Relative %: 62.1 % (ref 43.0–77.0)
Platelets: 262 10*3/uL (ref 150.0–400.0)
RBC: 5.14 Mil/uL (ref 4.22–5.81)
RDW: 13.1 % (ref 11.5–15.5)
WBC: 6.3 10*3/uL (ref 4.0–10.5)

## 2023-07-16 LAB — HEMOGLOBIN A1C: Hgb A1c MFr Bld: 6.9 % — ABNORMAL HIGH (ref 4.6–6.5)

## 2023-07-16 NOTE — Assessment & Plan Note (Signed)
 Chronic and stable.  Will check A1c today.  Continue Farxiga  given comorbid heart disease.

## 2023-07-16 NOTE — Assessment & Plan Note (Signed)
 Chronic issue.  Having some increased restlessness.  We talked about sleep hygiene and lifestyle modifications that he can make.  We talked about the risks of medication assisted treatment for sleep disorders.  We decided not to use any medications right now as there is some work to do to increase his exercise and improving sleep hygiene.

## 2023-07-16 NOTE — Assessment & Plan Note (Signed)
 Chronic and stable.  Comanaged with Dr. Rochelle Chu at [facility] dermatology.  He is finding benefit from Stelara  which he also uses for Crohn's disease.  Also using acitretin which does require some lab monitoring every 3 to 6 months.  I will check CMP, CBC, and lipids today for safety monitoring.  We will fax those results over to his dermatologist office as well.

## 2023-07-16 NOTE — Assessment & Plan Note (Signed)
 Chronic and stable.  Will continue medical management with irbesartan .

## 2023-07-16 NOTE — Assessment & Plan Note (Signed)
 Comanaged with Dr. Addie Holstein.  Doing very well with no symptoms of ischemic heart disease.  Continuing medical management with Plavix , metoprolol , Imdur , irbesartan , and Farxiga .

## 2023-07-16 NOTE — Assessment & Plan Note (Signed)
 Chronic and stable.  Comanaged with Dr. Bridgett Camps.  Continuing medical management with Stelara .  Doing very well with this medication.  I am going to check monitoring labs today including CMP and CBC.

## 2023-07-16 NOTE — Progress Notes (Signed)
 Established Patient Office Visit  Subjective   Patient ID: Darryl [name], male    DOB: 07/07/1950  Age: 73 y.o. MRN: [id number]  Chief Complaint  Patient presents with   Medical Management of Chronic Issues    Would lab blood work completed     HPI  73 year old person here for management of diabetes and hypertension.  Doing very well since I last saw him about 6 months ago.  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  He has some stress at home, his wife is having same declining memory and requiring some more supervision and care.  They are very active individuals living at friend's home.  Reports good adherence with his medications.  Denies any changes in his medications recently.  No hospitalizations or ED visits.  He finished a course of physical therapy for right hip pain which has made nice improvements.  His shoulder discomfort is also improved nicely.  He is golfing 1 time a week.    Objective:     BP 120/78   Pulse 67   Wt 184 lb (83.5 kg)   SpO2 96%   BMI 29.70 kg/m    Physical Exam  Gen: Well-appearing Eyes: Normal Ears: Hearing aids in place Heart: Regular, no murmur Lungs: Unlabored, clear Ext: Warm, no edema, normal joints     Assessment & Plan:   Problem List Items Addressed This Visit       High   Crohn's disease (HCC) (Chronic)   Chronic and stable.  Comanaged with Dr. Bridgett Camps.  Continuing medical management with Stelara .  Doing very well with this medication.  I am going to check monitoring labs today including CMP and CBC.      Coronary artery disease involving native coronary artery of native heart without angina pectoris (Chronic)   Comanaged with Dr. Addie Holstein.  Doing very well with no symptoms of ischemic heart disease.  Continuing medical management with Plavix , metoprolol , Imdur , irbesartan , and Farxiga .      Type II diabetes mellitus with manifestations (HCC) (Chronic)   Chronic and stable.  Will check A1c today.   Continue Farxiga  given comorbid heart disease.      Relevant Orders   Hemoglobin A1c     Medium    Essential hypertension (Chronic)   Chronic and stable.  Will continue medical management with irbesartan .      Psoriasis   Chronic and stable.  Comanaged with Dr. Rochelle Chu at [facility] dermatology.  He is finding benefit from Stelara  which he also uses for Crohn's disease.  Also using acitretin which does require some lab monitoring every 3 to 6 months.  I will check CMP, CBC, and lipids today for safety monitoring.  We will fax those results over to his dermatologist office as well.      Relevant Orders   CBC with Differential/Platelet   Comprehensive metabolic panel with GFR   Lipid panel     Low   Asthma (Chronic)   Chronic and stable.  Symptomatically under great control.  Plan to continue Advair and Singulair .  It has been many months since he has used albuterol  inhaler.      Insomnia - Primary   Chronic issue.  Having some increased restlessness.  We talked about sleep hygiene and lifestyle modifications that he can make.  We talked about the risks of medication assisted treatment for sleep disorders.  We decided not to use any medications right now as there is some work to  do to increase his exercise and improving sleep hygiene.       Return in about 6 months (around 01/15/2024).    Ether Hercules, MD

## 2023-07-16 NOTE — Assessment & Plan Note (Signed)
 Chronic and stable.  Symptomatically under great control.  Plan to continue Advair and Singulair .  It has been many months since he has used albuterol  inhaler.

## 2023-07-17 ENCOUNTER — Encounter: Payer: Self-pay | Admitting: Student in an Organized Health Care Education/Training Program

## 2023-07-26 ENCOUNTER — Other Ambulatory Visit: Payer: Self-pay | Admitting: Cardiology

## 2023-07-27 ENCOUNTER — Other Ambulatory Visit: Payer: Self-pay | Admitting: Student in an Organized Health Care Education/Training Program

## 2023-07-27 DIAGNOSIS — J4520 Mild intermittent asthma, uncomplicated: Secondary | ICD-10-CM

## 2023-07-28 ENCOUNTER — Other Ambulatory Visit: Payer: Self-pay | Admitting: Student in an Organized Health Care Education/Training Program

## 2023-07-28 DIAGNOSIS — J4520 Mild intermittent asthma, uncomplicated: Secondary | ICD-10-CM

## 2023-07-28 MED ORDER — FLUTICASONE-SALMETEROL 115-21 MCG/ACT IN AERO: 2.0000 | INHALATION_SPRAY | Freq: Two times a day (BID) | RESPIRATORY_TRACT | 1 refills | Status: DC

## 2023-07-28 NOTE — Telephone Encounter (Unsigned)
 Copied from CRM [id number]. Topic: Clinical - Medication Refill >> Jul 28, 2023 10:00 AM Tiffany H wrote: Most Recent Primary Care Visit:  Provider: Ether Hercules  Department: [facility]  Visit Type: OFFICE VISIT  Date: 07/16/2023  Medication: ADVAIR HFA 115-21 MCG/ACT inhaler [[id number]]    Has the patient contacted their pharmacy? Yes (Agent: If no, request that the patient contact the pharmacy for the refill. If patient does not wish to contact the pharmacy document the reason why and proceed with request.) (Agent: If yes, when and what did the pharmacy advise?)  Is this the correct pharmacy for this prescription? No If no, delete pharmacy and type the correct one.  This is the patient's preferred pharmacy:   [facility] [place], [place] - [address] [facility] [address] [place] [address] Phone: [phone] Fax: [phone]  Has the prescription been filled recently? No  Is the patient out of the medication? No  Has the patient been seen for an appointment in the last year OR does the patient have an upcoming appointment? Yes.   Can we respond through MyChart? Yes  Agent: Please be advised that Rx refills may take up to 3 business days. We ask that you follow-up with your pharmacy.

## 2023-07-28 NOTE — Telephone Encounter (Signed)
 Darryl [name] [facility] PATIENT/PROVIDER

## 2023-07-30 ENCOUNTER — Other Ambulatory Visit: Payer: Self-pay

## 2023-07-31 ENCOUNTER — Other Ambulatory Visit: Payer: Self-pay

## 2023-07-31 ENCOUNTER — Other Ambulatory Visit (HOSPITAL_COMMUNITY): Payer: Self-pay

## 2023-07-31 NOTE — Progress Notes (Signed)
 Specialty Pharmacy Refill Coordination Note  Darryl [name] is a 73 y.o. male contacted today regarding refills of specialty medication(s) Stelara .  Patient requested (Patient-Rptd) Pickup at [facility] date: (Patient-Rptd) 08/08/23   Medication will be filled on 08/07/23.

## 2023-08-03 ENCOUNTER — Other Ambulatory Visit: Payer: Self-pay | Admitting: Cardiology

## 2023-09-03 LAB — OPHTHALMOLOGY REPORT-SCANNED

## 2023-09-05 ENCOUNTER — Other Ambulatory Visit: Payer: Self-pay

## 2023-09-05 ENCOUNTER — Other Ambulatory Visit (HOSPITAL_COMMUNITY): Payer: Self-pay

## 2023-09-05 MED FILL — Icosapent Ethyl Cap 1 GM: ORAL | 90 days supply | Qty: 360 | Fill #0 | Status: AC

## 2023-09-09 ENCOUNTER — Other Ambulatory Visit (HOSPITAL_COMMUNITY): Payer: Self-pay

## 2023-09-09 ENCOUNTER — Encounter: Payer: Self-pay | Admitting: Pharmacist

## 2023-09-09 NOTE — Progress Notes (Signed)
 Pharmacy Quality Measure Review  This patient is appearing on a report for being at risk of failing the adherence measure for diabetes medications this calendar year.   Medication: dapagliflozin  10mg   Last fill date: 07/15/2023 for 30 day supply per adherence report but Dr Anson Basta database shows patient filled 30 day supply on 08/15/2023  Insurance report was not up to date. No action needed at this time.   Cecilie Coffee, PharmD Clinical Pharmacist [facility]  Population Health [phone]

## 2023-09-15 ENCOUNTER — Other Ambulatory Visit (HOSPITAL_COMMUNITY): Payer: Self-pay

## 2023-09-17 ENCOUNTER — Other Ambulatory Visit: Payer: Self-pay

## 2023-09-17 ENCOUNTER — Other Ambulatory Visit (HOSPITAL_COMMUNITY): Payer: Self-pay

## 2023-09-17 NOTE — Progress Notes (Signed)
 Specialty Pharmacy Refill Coordination Note  Spoke with Darryl, [name] (Self).   Darryl [name] is a 73 y.o. male contacted today regarding refills of specialty medication(s) Ustekinumab  (Stelara )  Injection date: 09/22/23.   Patient requested: Pickup at [facility] at [facility] date: 09/19/23  Medication will be filled on 09/18/23.

## 2023-10-24 ENCOUNTER — Other Ambulatory Visit: Payer: Self-pay

## 2023-10-27 ENCOUNTER — Other Ambulatory Visit: Payer: Self-pay

## 2023-10-27 DIAGNOSIS — Z85828 Personal history of other malignant neoplasm of skin: Secondary | ICD-10-CM | POA: Diagnosis not present

## 2023-10-27 DIAGNOSIS — L858 Other specified epidermal thickening: Secondary | ICD-10-CM | POA: Diagnosis not present

## 2023-10-27 NOTE — Progress Notes (Signed)
 Specialty Pharmacy Refill Coordination Note  Darryl [name] is a 73 y.o. male contacted today regarding refills of specialty medication(s) Ustekinumab  (Stelara )   Patient requested [name] at [facility] at [place] date: 11/04/23   Medication will be filled on 11/03/23.

## 2023-11-03 ENCOUNTER — Other Ambulatory Visit: Payer: Self-pay

## 2023-11-03 ENCOUNTER — Other Ambulatory Visit (HOSPITAL_COMMUNITY): Payer: Self-pay

## 2023-11-12 ENCOUNTER — Other Ambulatory Visit: Payer: Self-pay | Admitting: Student in an Organized Health Care Education/Training Program

## 2023-11-12 DIAGNOSIS — J4520 Mild intermittent asthma, uncomplicated: Secondary | ICD-10-CM

## 2023-11-28 ENCOUNTER — Other Ambulatory Visit: Payer: Self-pay

## 2023-11-28 NOTE — Progress Notes (Signed)
 Specialty Pharmacy Ongoing Clinical Assessment Note  Darryl [name] is a 73 y.o. male who is being followed by the specialty pharmacy service for RxSp Crohn's Disease   Patient's specialty medication(s) reviewed today: Ustekinumab  (Stelara )   Missed doses in the last 4 weeks: 0   Patient/Caregiver did not have any additional questions or concerns.   Therapeutic benefit summary: Patient is achieving benefit   Adverse events/side effects summary: No adverse events/side effects   Patient's therapy is appropriate to: Continue    Goals Addressed             This Visit's Progress    Minimize recurrence of flares   On track    Patient is on track. Patient will maintain adherence. [name] reports he is well-controlled at this time with no recent flares. Feels like every 6 week schedule has him better controlled than every 8 weeks.         Follow up: 1 year  Powell [name] Gallus Specialty Pharmacist

## 2023-12-10 DIAGNOSIS — Z23 Encounter for immunization: Secondary | ICD-10-CM | POA: Diagnosis not present

## 2023-12-10 MED FILL — Icosapent Ethyl Cap 1 GM: ORAL | 90 days supply | Qty: 360 | Fill #1 | Status: AC

## 2023-12-15 ENCOUNTER — Other Ambulatory Visit: Payer: Self-pay

## 2023-12-15 ENCOUNTER — Other Ambulatory Visit: Payer: Self-pay | Admitting: Internal Medicine

## 2023-12-15 NOTE — Progress Notes (Signed)
 Specialty Pharmacy Refill Coordination Note  Darryl [name] is a 73 y.o. male contacted today regarding refills of specialty medication(s) Ustekinumab  (Stelara )   Patient requested [name] at [facility] at [place] date: 12/18/23   Medication will be filled on 09.24.25.    Refill request pending, next injection due 09.27.25.

## 2023-12-16 ENCOUNTER — Other Ambulatory Visit: Payer: Self-pay

## 2023-12-16 MED ORDER — USTEKINUMAB 90 MG/ML ~~LOC~~ SOSY
90.0000 mg | PREFILLED_SYRINGE | SUBCUTANEOUS | 11 refills | Status: AC
  Filled 2023-12-16: qty 1, 42d supply, fill #0
  Filled 2024-01-22: qty 1, 42d supply, fill #1
  Filled 2024-03-02: qty 1, 42d supply, fill #2
  Filled 2024-04-12 – 2024-04-20 (×2): qty 1, 42d supply, fill #3

## 2023-12-17 ENCOUNTER — Other Ambulatory Visit: Payer: Self-pay

## 2023-12-22 NOTE — Progress Notes (Signed)
 Darryl [name]                                          MRN: [id number]   12/22/2023   The VBCI Quality Team Specialist reviewed this patient medical record for the purposes of chart review for care gap closure. The following were reviewed: chart review for care gap closure-kidney health evaluation for diabetes:eGFR  and uACR.    VBCI Quality Team

## 2024-01-07 ENCOUNTER — Ambulatory Visit: Attending: Cardiovascular Disease | Admitting: Cardiology

## 2024-01-07 ENCOUNTER — Encounter: Payer: Self-pay | Admitting: Cardiology

## 2024-01-07 VITALS — BP 110/70 | HR 76 | Ht 66.0 in | Wt 182.2 lb

## 2024-01-07 DIAGNOSIS — Z9861 Coronary angioplasty status: Secondary | ICD-10-CM

## 2024-01-07 DIAGNOSIS — R0609 Other forms of dyspnea: Secondary | ICD-10-CM | POA: Insufficient documentation

## 2024-01-07 DIAGNOSIS — E785 Hyperlipidemia, unspecified: Secondary | ICD-10-CM | POA: Diagnosis not present

## 2024-01-07 DIAGNOSIS — I255 Ischemic cardiomyopathy: Secondary | ICD-10-CM

## 2024-01-07 DIAGNOSIS — I2510 Atherosclerotic heart disease of native coronary artery without angina pectoris: Secondary | ICD-10-CM

## 2024-01-07 DIAGNOSIS — E1169 Type 2 diabetes mellitus with other specified complication: Secondary | ICD-10-CM

## 2024-01-07 DIAGNOSIS — I10 Essential (primary) hypertension: Secondary | ICD-10-CM

## 2024-01-07 NOTE — Progress Notes (Signed)
 Cardiology Office Note:  .   Date:  01/07/2024  ID:  Darryl [name], DOB 1951-03-25, MRN [id number] PCP: Anner Alm LELON, MD  [facility] Providers Cardiologist:  Alm Anner, MD     Chief Complaint  Patient presents with   Follow-up   Coronary Artery Disease    No angina, just had some exertional dyspnea with climbing up stairs    Patient Profile: .     Darryl [name] is a 73 y.o. male with a CV PMH noted below who presents here for 24-month follow-up.  CV PMH: Inferior STEMI 07/13/2018: RCA PCI staged PCI-MRI.  Mid LAD CTO Mild ICM (EF estimated 40 to 45% with distal septal apical akinesis) -> post MI LV thrombus resolved by January 2021 GDMT previous limited by chronic orthostatic hypotension HLD Chronic colitis    Darryl [name] was last seen on June 23, 2023 -was having some> unusual symptoms of dyspnea and palpitations that I thought may have been potentially related to angina so we added Imdur  30 mg daily.  No other changes to medications.  Subjective  Discussed the use of AI scribe software for clinical note transcription with the patient, who gave verbal consent to proceed.  History of Present Illness Darryl [name] is a 73 year old male with coronary artery disease and class one angina who presents with shortness of breath and vertigo.  He experiences increased shortness of breath when climbing 22 steps at hockey games, describing it as 'breathing a little harder than usual.' No issues with shortness of breath are noted while playing golf. No chest pain, irregular heartbeats, or swelling in his legs. No shortness of breath occurs when lying flat or waking up short of breath.  He had a significant episode of vertigo last Wednesday, which kept him in bed all day. Since then, he has felt lightheaded, although there is improvement. His blood pressure was 138/94 during the episode and 110/70 today, with persistent lightheadedness at the lower  reading. No heart racing or passing out spells.  He recalls a previous heart attack where he 'could not breathe' and 'almost passed out,' but he has not experienced similar symptoms recently.  He has not been exercising regularly since returning to part-time work, which involves working from 9:30 AM to 3:30 PM. He plans to adjust his work schedule to allow for morning exercise..  Cardiovascular ROS: positive for - dyspnea on exertion and this is really only when he is walking fast up several flights of steps to activity.  He also has been dealing with recent issues with vertigo negative for - chest pain, edema, irregular heartbeat, orthopnea, palpitations, paroxysmal nocturnal dyspnea, rapid heart rate, shortness of breath, or syncope or near-syncope, TIA or amaurosis fugax, claudication.  Melena, hematochezia Hematuria or epistaxis.  His current medications include Farxiga  10 mg for diabetes and heart failure, irbesartan  half a tablet of 75 mg, Crestor  40 mg, Vascepa  two capsules twice a day, Plavix , Stelara  for Crohn's disease, acitretin for psoriatic arthritis, Advair, albuterol  as needed, and Singulair .  He is preparing for a trip to [place] followed by a trip to [place] to watch her kids again.  ROS:  Review of Systems - Negative except recent episodes of vertigo associated with just generally feeling    Objective   Current Meds Cardiovascular Medications Sig   clopidogrel  (PLAVIX ) 75 MG tablet TAKE ONE TABLET BY MOUTH ONCE DAILY   FARXIGA  10 MG TABS tablet TAKE ONE TABLET BY MOUTH  DAILY BEFORE BREAKFAST   irbesartan  (AVAPRO ) 75 MG tablet Take 0.5 tablets (37.5 mg total) by mouth daily.   metoprolol  succinate (TOPROL -XL) 25 MG 24 hr tablet TAKE ONE TABLET BY MOUTH ONCE DAILY   nitroGLYCERIN  (NITROSTAT ) 0.4 MG SL tablet PLACE 1 TAB UNDER TONGUE AS NEEDED FOR CHEST PAIN. MAY REPEAT EVERY 5 MIN FOR A TOTAL OF 3 DOSES.   rosuvastatin  (CRESTOR ) 40 MG tablet TAKE 1 TABLET ONCE DAILY.    VASCEPA  1 g capsule TAKE TWO CAPSULES BY MOUTH TWICE DAILY    Noncardiovascular Medications Sig   acitretin (SORIATANE) 10 MG capsule Take 10 mg by mouth daily.   ADVAIR HFA 115-21 MCG/ACT inhaler Inhale 2 puffs into the lungs 2 (two) times daily.   montelukast  (SINGULAIR ) 10 MG tablet Take 1 tablet (10 mg total) by mouth at bedtime.   Multiple Vitamins-Minerals (CENTRUM SILVER PO) Take 1 tablet by mouth daily.   pantoprazole  (PROTONIX ) 40 MG tablet TAKE ONE TABLET BY MOUTH DAILY   ustekinumab  (STELARA ) 90 MG/ML SOSY injection Inject 1 mL (90 mg total) into the skin every 6 (six) weeks.   VENTOLIN  HFA 108 (90 Base) MCG/ACT inhaler USE 2 PUFFS EVERY 6 HOURS AS NEEDED FOR WHEEZING.    Studies Reviewed: SABRA        Lab Results  Component Value Date   CHOL 142 07/16/2023   HDL 51.70 07/16/2023   LDLCALC 57 07/16/2023   LDLDIRECT 72.0 04/05/2021   TRIG 166.0 (H) 07/16/2023   CHOLHDL 3 07/16/2023   Lab Results  Component Value Date   NA 138 07/16/2023   K 4.8 07/16/2023   CREATININE 1.10 07/16/2023   GFR 66.37 07/16/2023   GLUCOSE 162 (H) 07/16/2023   Results LABS: - Hemoglobin A1c: 6.9% No new studies.  Previous studies: TTE 04/06/2019: LVEF 40-45% distal septal and apical akinesis.  GR 1 DD.  Mild LA dilation.  Normal RV.  Mild AOV sclerosis with no stenosis.  Normal RAP. Cardiac Cath 06/2018-before, and after staged PCI 07/24/2018: for Inferior STEMI Initial PCI: p-mRCA 100%:  p-mRCA 100% (DES PCI - p-mRCA 100% (DES PCI - Resolute Onyx 3.5 x 26 (post-dilated ~3.7 mm) ; dRCA ~50% & PAV 40%; pRI 85% (Staged PCI). 100% LAD CTO after small D1 with ost-prox 60%. R-L collaterals to LAD seen post PCI.                                                     Staged PCI pRI 85-90%: Resolute Onyx DES 2.25 x 15 (2.5 mm)      Risk Assessment/Calculations:              Physical Exam:   VS:  BP 110/70 (BP Location: Left Arm, Patient Position: Sitting, Cuff Size: Normal)   Pulse 76   Ht 5' 6  (1.676 m)   Wt 182 lb 3.2 oz (82.6 kg)   SpO2 97%   BMI 29.41 kg/m    Wt Readings from Last 3 Encounters:  01/07/24 182 lb 3.2 oz (82.6 kg)  07/16/23 184 lb (83.5 kg)  06/23/23 183 lb 3.2 oz (83.1 kg)      GEN: Healthy appearing.  Well nourished, well groomed in no acute distress; borderline obese  NECK: No JVD; No carotid bruits CARDIAC: Normal S1, S2; RRR, no murmurs, rubs, gallops RESPIRATORY:  Clear to  auscultation without rales, wheezing or rhonchi ; nonlabored, good air movement. ABDOMEN: Soft, non-tender, non-distended EXTREMITIES:  No edema; No deformity      ASSESSMENT AND PLAN: .    Problem List Items Addressed This Visit       Cardiology Problems   CAD S/P percutaneous coronary angioplasty - Primary (Chronic)   Significant three-vessel disease with stents to the RCA and RI and known occluded LAD filling via collaterals. Based on the extent of disease, we have chosen to keep him off maintenance therapy with Plavix  75 mg monotherapy Okay to hold Plavix  5 to 7 days preop for surgeries or procedures.      Coronary artery disease involving native coronary artery of native heart without angina pectoris (Chronic)   He is doing well.  Not really having any anginal type symptoms.  He is anginal equivalent was really feeling fullness in his chest where he could not breathe and near syncope.  He has not had any of those types of this, just notes exertional dyspnea, stairs he also acknowledges being somewhat conditioned.. Did not tolerate Imdur  so we will remove from his list. At this point I I just recommend that he try to skip back and be safe since it seems like he has been Quite deconditioned Continue low doses of GDMT meds: Toprol -XL irbesartan  37.5 mg daily Continue combination of rosuvastatin  40 daily and Vascepa  1 g (2 tab twice daily Continue maintenance dose Plavix  75 mg daily -> okay to hold 5 to 7 days preop for surgeries or procedures       Essential hypertension  (Chronic)   Generally well-controlled blood pressure current meds:  Continue current low-dose of irbesartan  37.5 mg daily, and Toprol -XL 25 mg daily.      Hyperlipidemia associated with type 2 diabetes mellitus (HCC) (Chronic)   Lipid levels from April were standing with an LDL of 57 due for labs rechecked in October.  He is on combination of rosuvastatin  40 mg daily along with Vascepa  2 g twice daily Continue risk factor modification with dietary adjustments along with increased physical activity. - Continue current medications.  His A1c was 6.9 and is due for repeat check in October.  He is only on Farxiga  which is more for his cardiomyopathy than actual diabetes. - Defer management to PCP      Ischemic cardiomyopathy (Chronic)   Mildly reduced EF on echo due to occluded LAD.  NYHA class I symptoms.  Euvolemic on exam.  Only notes of mild strenuous exertion. Lowish blood pressures and fatigue have limited our building to titrate GDMT. Continue low-dose irbesartan  37.5 mg daily along with Toprol -XL 25 mg daily => BP did not tolerate Entresto Continue Farxiga  10 mg daily Since he is euvolemic he is not on a diuretic-needed loop diuretic or MRA        Other   Dyspnea on exertion        Follow-Up: Return in about 6 months (around 07/07/2024) for Followup with Telemedicine, 1 Yr Follow-up, [employer].  I spent 41 minutes in the care of Darryl [name] today including reviewing labs (1 min), reviewing studies (2 minutes reviewing cath films), face to face time discussing treatment options (21 minutes), reviewing records from previous clinic notes (3 minutes), 14 minutes dictating, and documenting in the encounter.     Signed, Alm MICAEL Clay, MD, MS Alm Clay, M.D., M.S. Interventional Cardiologist  [facility] Pager # [phone]

## 2024-01-07 NOTE — Assessment & Plan Note (Signed)
 Significant three-vessel disease with stents to the RCA and RI and known occluded LAD filling via collaterals. Based on the extent of disease, we have chosen to keep him off maintenance therapy with Plavix  75 mg monotherapy Okay to hold Plavix  5 to 7 days preop for surgeries or procedures.

## 2024-01-07 NOTE — Assessment & Plan Note (Signed)
 He is doing well.  Not really having any anginal type symptoms.  He is anginal equivalent was really feeling fullness in his chest where he could not breathe and near syncope.  He has not had any of those types of this, just notes exertional dyspnea, stairs he also acknowledges being somewhat conditioned.. Did not tolerate Imdur  so we will remove from his list. At this point I I just recommend that he try to skip back and be safe since it seems like he has been Quite deconditioned Continue low doses of GDMT meds: Toprol -XL irbesartan  37.5 mg daily Continue combination of rosuvastatin  40 daily and Vascepa  1 g (2 tab twice daily Continue maintenance dose Plavix  75 mg daily -> okay to hold 5 to 7 days preop for surgeries or procedures

## 2024-01-07 NOTE — Assessment & Plan Note (Signed)
 Mildly reduced EF on echo due to occluded LAD.  NYHA class I symptoms.  Euvolemic on exam.  Only notes of mild strenuous exertion. Lowish blood pressures and fatigue have limited our building to titrate GDMT. Continue low-dose irbesartan  37.5 mg daily along with Toprol -XL 25 mg daily => BP did not tolerate Entresto Continue Farxiga  10 mg daily Since he is euvolemic he is not on a diuretic-needed loop diuretic or MRA

## 2024-01-07 NOTE — Assessment & Plan Note (Addendum)
 Lipid levels from April were standing with an LDL of 57 due for labs rechecked in October.  He is on combination of rosuvastatin  40 mg daily along with Vascepa  2 g twice daily Continue risk factor modification with dietary adjustments along with increased physical activity. - Continue current medications.  His A1c was 6.9 and is due for repeat check in October.  He is only on Farxiga  which is more for his cardiomyopathy than actual diabetes. - Defer management to PCP

## 2024-01-07 NOTE — Patient Instructions (Addendum)
 Medication Instructions:   No changes  *If you need a refill on your cardiac medications before your next appointment, please call your pharmacy*   Lab Work:  Not needed     Testing/Procedures:  Not needed  Follow-Up: At [facility], you and your health needs are our priority.  As part of our continuing mission to provide you with exceptional heart care, we have created designated Provider Care Teams.  These Care Teams include your primary Cardiologist (physician) and Advanced Practice Providers (APPs -  Physician Assistants and Nurse Practitioners) who all work together to provide you with the care you need, when you need it.  We recommend signing up for the patient portal called MyChart.  Sign up information is provided on this After Visit Summary.  MyChart is used to connect with patients for Virtual Visits (Telemedicine).  Patients are able to view lab/test results, encounter notes, upcoming appointments, etc.  Non-urgent messages can be sent to your provider as well.   To learn more about what you can do with MyChart, go to [email or web address].    Your next appointment:   6 month(s)  The format for your next appointment:   Virtual Visit   Provider:   Alm Clay, MD   Other Instruction   Your physician discussed the importance of regular exercise and recommended that you start or continue a regular exercise program for good health.

## 2024-01-07 NOTE — Assessment & Plan Note (Signed)
 Generally well-controlled blood pressure current meds:  Continue current low-dose of irbesartan  37.5 mg daily, and Toprol -XL 25 mg daily.

## 2024-01-15 ENCOUNTER — Ambulatory Visit: Admitting: Student in an Organized Health Care Education/Training Program

## 2024-01-22 ENCOUNTER — Encounter: Payer: Self-pay | Admitting: Student in an Organized Health Care Education/Training Program

## 2024-01-22 ENCOUNTER — Ambulatory Visit: Payer: Self-pay | Admitting: Student in an Organized Health Care Education/Training Program

## 2024-01-22 ENCOUNTER — Other Ambulatory Visit: Payer: Self-pay

## 2024-01-22 ENCOUNTER — Ambulatory Visit: Admitting: Student in an Organized Health Care Education/Training Program

## 2024-01-22 ENCOUNTER — Other Ambulatory Visit (HOSPITAL_COMMUNITY): Payer: Self-pay

## 2024-01-22 VITALS — BP 131/90 | HR 69 | Wt 184.0 lb

## 2024-01-22 DIAGNOSIS — H8120 Vestibular neuronitis, unspecified ear: Secondary | ICD-10-CM | POA: Insufficient documentation

## 2024-01-22 DIAGNOSIS — J4521 Mild intermittent asthma with (acute) exacerbation: Secondary | ICD-10-CM

## 2024-01-22 DIAGNOSIS — E118 Type 2 diabetes mellitus with unspecified complications: Secondary | ICD-10-CM

## 2024-01-22 DIAGNOSIS — Z79899 Other long term (current) drug therapy: Secondary | ICD-10-CM

## 2024-01-22 DIAGNOSIS — I10 Essential (primary) hypertension: Secondary | ICD-10-CM

## 2024-01-22 LAB — CBC
HCT: 49.6 % (ref 39.0–52.0)
Hemoglobin: 16.7 g/dL (ref 13.0–17.0)
MCHC: 33.6 g/dL (ref 30.0–36.0)
MCV: 93.5 fl (ref 78.0–100.0)
Platelets: 256 K/uL (ref 150.0–400.0)
RBC: 5.3 Mil/uL (ref 4.22–5.81)
RDW: 12.9 % (ref 11.5–15.5)
WBC: 6.7 K/uL (ref 4.0–10.5)

## 2024-01-22 LAB — COMPREHENSIVE METABOLIC PANEL WITH GFR
ALT: 42 U/L (ref 0–53)
AST: 36 U/L (ref 0–37)
Albumin: 4.5 g/dL (ref 3.5–5.2)
Alkaline Phosphatase: 82 U/L (ref 39–117)
BUN: 17 mg/dL (ref 6–23)
CO2: 26 meq/L (ref 19–32)
Calcium: 9.4 mg/dL (ref 8.4–10.5)
Chloride: 103 meq/L (ref 96–112)
Creatinine, Ser: 0.98 mg/dL (ref 0.40–1.50)
GFR: 75.96 mL/min (ref >60.00)
Glucose, Bld: 186 mg/dL — ABNORMAL HIGH (ref 70–99)
Potassium: 4.6 meq/L (ref 3.5–5.1)
Sodium: 138 meq/L (ref 135–145)
Total Bilirubin: 0.7 mg/dL (ref 0.2–1.2)
Total Protein: 7.1 g/dL (ref 6.0–8.3)

## 2024-01-22 LAB — TSH: TSH: 1.06 u[IU]/mL (ref 0.35–5.50)

## 2024-01-22 LAB — MAGNESIUM: Magnesium: 1.9 mg/dL (ref 1.5–2.5)

## 2024-01-22 LAB — HEMOGLOBIN A1C: Hgb A1c MFr Bld: 8.1 % — ABNORMAL HIGH (ref 4.6–6.5)

## 2024-01-22 MED ORDER — OZEMPIC (0.25 OR 0.5 MG/DOSE) 2 MG/3ML ~~LOC~~ SOPN
0.2500 mg | PEN_INJECTOR | SUBCUTANEOUS | 2 refills | Status: DC
  Filled 2024-01-22: qty 3, 28d supply, fill #0
  Filled 2024-02-19: qty 3, 28d supply, fill #1
  Filled 2024-03-18: qty 3, 28d supply, fill #2

## 2024-01-22 NOTE — Assessment & Plan Note (Signed)
 Chronic and stable.  Tolerating Farxiga  really well.  Will check A1c today.  Goal A1c less than 7%.  Using irbesartan  for renal protection.  Using Plavix  and rosuvastatin  for secondary prevention of ischemic events.

## 2024-01-22 NOTE — Assessment & Plan Note (Signed)
 Patient uses Stelara  for Crohn's disease and Soriatane for psoriasis.  Both these medications require safety monitoring with labs every 3-6 months.  Symptomatically is doing well.  Will check labs today.

## 2024-01-22 NOTE — Assessment & Plan Note (Signed)
 Symptoms suggest a recent episode of an acute vestibular syndrome that sounds most like vestibular neuritis.  Symptoms are now resolved and neuroexam is normal.  No benefit to meclizine.  I recommended if this happens in the future, to come in for evaluation given his risk of cerebral infarction.  For now, to lower that risk, he is using Plavix  and rosuvastatin .

## 2024-01-22 NOTE — Progress Notes (Signed)
 Established Patient Office Visit  Patient ID: Darryl [name], male    DOB: 1950-11-05  Age: 73 y.o. MRN: [id number] PCP: Jerrell Cleatus Ned, MD  Chief Complaint  Patient presents with   Medical Management of Chronic Issues    6 month recheck   Flu-completed at pharmacy Eye exam-completed 06/2023 ( requesting records)  Tdap-unsure    Subjective:     HPI  Discussed the use of AI scribe software for clinical note transcription with the patient, who gave verbal consent to proceed.  History of Present Illness Darryl [name] is a 73 year old male with a history of vertigo who presents with a recent episode of severe vertigo.  He experienced a severe episode of vertigo approximately three weeks ago, which was significant enough to keep him bedridden for a day. The vertigo was described as dizziness not related to changes in head position and persisted throughout the day, improving over several days. It has been 10 to 15 years since he last experienced vertigo, which was previously severe but eventually resolved on its own.  During the recent vertigo episode, his blood pressure was elevated at 134/91 mmHg, which he considered high for him. His blood pressure has generally been well-controlled, with a reading of 110/61 mmHg at a recent cardiology visit. He mentions that his blood pressure today is around 140.  No recent respiratory infections, but he mentions having brief asthma spells, which he attributes to seasonal changes. He uses an atomizer for asthma relief and reports using his Ventolin  inhaler about once a week. He is also on Advair, Plavix , Farxiga , Singulair , and Psoriatane for his other conditions.  He has been traveling recently, including trips to [place] and [place], during which he engaged in significant walking without issues. 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  He also notes a past shoulder injury that limits his range of motion and causes some discomfort.     Objective:     BP (!) 131/90 (BP Location: Right Arm, Cuff Size: Normal)   Pulse 69   Wt 184 lb (83.5 kg)   SpO2 95%   BMI 29.70 kg/m   Physical Exam  Gen: Well-appearing man Neck: Normal thyroid , no nodules or adenopathy Heart: Regular, no murmur Lungs; unlabored, clear throughout Ext: Warm, no edema, normal joints Neuro: Alert, conversational, full strength upper and lower extremities, cranial nerves are normal, normal get up and go, normal gait and balance    Assessment & Plan:   Problem List Items Addressed This Visit       High   Type II diabetes mellitus with manifestations (HCC) - Primary (Chronic)   Chronic and stable.  Tolerating Farxiga  really well.  Will check A1c today.  Goal A1c less than 7%.  Using irbesartan  for renal protection.  Using Plavix  and rosuvastatin  for secondary prevention of ischemic events.      Relevant Orders   Hemoglobin A1c     Medium    Essential hypertension (Chronic)   Blood pressure is well-controlled.  Chronic and stable.  Will continue with irbesartan  and check renal function today.        Low   Asthma (Chronic)   Chronic and stable.  But a month ago he was having worsening symptom control due to seasonal allergies.  Doing really well now.  Will plan to continue with Advair daily,  currently he is using reliever inhaler only about once a week which is excellent.      Encounter for long-term current use of high risk medication (Chronic)   Patient uses Stelara  for Crohn's disease and Soriatane for psoriasis.  Both these medications require safety monitoring with labs every 3-6 months.  Symptomatically is doing well.  Will check labs today.      Relevant Orders   CBC   Comprehensive metabolic panel with GFR   Magnesium    TSH     Unprioritized   Vestibular neuritis   Symptoms suggest a  recent episode of an acute vestibular syndrome that sounds most like vestibular neuritis.  Symptoms are now resolved and neuroexam is normal.  No benefit to meclizine.  I recommended if this happens in the future, to come in for evaluation given his risk of cerebral infarction.  For now, to lower that risk, he is using Plavix  and rosuvastatin .        Return in about 6 months (around 07/22/2024).    Cleatus Debby Specking, MD [facility]

## 2024-01-22 NOTE — Patient Instructions (Signed)
  VISIT SUMMARY: Today, you visited to discuss a recent episode of severe vertigo and to review your ongoing health conditions. You experienced a significant vertigo episode three weeks ago, which has since improved. We also reviewed your blood pressure, asthma, diabetes, psoriasis, and shoulder pain.  YOUR PLAN: -TYPE 2 DIABETES MELLITUS WITH UNSPECIFIED COMPLICATIONS: Type 2 diabetes is a condition where your body does not use insulin properly, leading to high blood sugar levels. We will continue to manage your diabetes and have ordered blood work to check your A1c levels, which will help us  understand your blood sugar control over the past few months.  -ESSENTIAL HYPERTENSION: Essential hypertension is high blood pressure without a known cause. Your blood pressure is generally well-controlled, but we noted some occasional elevations. We will continue to monitor it closely.  -ASTHMA: Asthma is a condition where your airways narrow and swell, making it difficult to breathe. Your asthma is well-controlled with your current medications, but you experience occasional brief spells due to seasonal changes. Continue using your Ventolin  inhaler as needed and your other prescribed medications.  -PSORIASIS: Psoriasis is a skin condition that causes red, itchy, and scaly patches. Your psoriasis is well-managed with Acitretin, and we are coordinating with your gastroenterologist to ensure there are no drug interactions.  -BILATERAL SHOULDER PAIN: You have chronic pain in both shoulders, which limits your range of motion and causes popping sounds. We recommend shoulder exercises and strengthening of the rotator cuff. If the pain worsens or if you are interested in learning specific exercises, we can refer you to physical therapy.  INSTRUCTIONS: Please complete the blood work to check your A1c levels. If your shoulder pain worsens or if you are interested in physical therapy, let us  know. Continue monitoring your  blood pressure and use your asthma medications as prescribed.

## 2024-01-22 NOTE — Assessment & Plan Note (Signed)
 Chronic and stable.  But a month ago he was having worsening symptom control due to seasonal allergies.  Doing really well now.  Will plan to continue with Advair daily, currently he is using reliever inhaler only about once a week which is excellent.

## 2024-01-22 NOTE — Assessment & Plan Note (Signed)
 Blood pressure is well-controlled.  Chronic and stable.  Will continue with irbesartan  and check renal function today.

## 2024-01-23 ENCOUNTER — Other Ambulatory Visit (HOSPITAL_COMMUNITY): Payer: Self-pay

## 2024-01-23 ENCOUNTER — Telehealth: Payer: Self-pay

## 2024-01-23 NOTE — Telephone Encounter (Signed)
 Pharmacy Patient Advocate Encounter   Received notification from RX Request Messages that prior authorization for Ozempic (0.25 or 0.5 MG/DOSE) 2MG /3ML pen-injectors is required/requested.   Insurance verification completed.   The patient is insured through [employer].   Per test claim: PA required and submitted KEY/EOC/Request #: [id number] due to AUTHORIZATION ALREADY ON FILE STARING 01/22/24 TO 03/24/25.

## 2024-01-26 ENCOUNTER — Other Ambulatory Visit: Payer: Self-pay

## 2024-01-26 ENCOUNTER — Other Ambulatory Visit (HOSPITAL_COMMUNITY): Payer: Self-pay

## 2024-01-26 NOTE — Progress Notes (Signed)
 Specialty Pharmacy Refill Coordination Note  Darryl [name] is a 73 y.o. male contacted today regarding refills of specialty medication(s) Ustekinumab  (STELARA )   Patient requested [name] at [facility] at [place] date: 01/27/24   Medication will be filled on: 01/26/24

## 2024-03-02 ENCOUNTER — Other Ambulatory Visit: Payer: Self-pay

## 2024-03-02 ENCOUNTER — Other Ambulatory Visit (HOSPITAL_COMMUNITY): Payer: Self-pay

## 2024-03-02 NOTE — Progress Notes (Signed)
 Specialty Pharmacy Refill Coordination Note  MyChart Questionnaire Submission  Darryl [name] is a 73 y.o. male contacted today regarding refills of specialty medication(s) Stelara .  Doses on hand: (Patient-Rptd) None   Injection date: (Patient-Rptd) 03/13/24  Patient requested: (Patient-Rptd) Pickup at [facility] at [facility] date: 03/08/24  Medication will be filled on 03/05/24

## 2024-03-02 NOTE — Progress Notes (Signed)
 Clinical Intervention Note  Clinical Intervention Notes: Patient reports initiating Ozempic . No DDIs identified with Stelara .   Clinical Intervention Outcomes: Prevention of an adverse drug event   Darryl [name] Specialty Pharmacist

## 2024-03-04 ENCOUNTER — Other Ambulatory Visit: Payer: Self-pay

## 2024-03-12 ENCOUNTER — Other Ambulatory Visit (HOSPITAL_COMMUNITY): Payer: Self-pay

## 2024-03-12 ENCOUNTER — Other Ambulatory Visit: Payer: Self-pay | Admitting: Student in an Organized Health Care Education/Training Program

## 2024-03-12 DIAGNOSIS — E781 Pure hyperglyceridemia: Secondary | ICD-10-CM

## 2024-03-12 DIAGNOSIS — I2510 Atherosclerotic heart disease of native coronary artery without angina pectoris: Secondary | ICD-10-CM

## 2024-03-12 MED ORDER — VASCEPA 1 G PO CAPS
2.0000 g | ORAL_CAPSULE | Freq: Two times a day (BID) | ORAL | 1 refills | Status: AC
  Filled 2024-03-12: qty 360, 90d supply, fill #0

## 2024-03-20 ENCOUNTER — Other Ambulatory Visit: Payer: Self-pay | Admitting: Student in an Organized Health Care Education/Training Program

## 2024-03-20 DIAGNOSIS — J4520 Mild intermittent asthma, uncomplicated: Secondary | ICD-10-CM

## 2024-04-08 ENCOUNTER — Ambulatory Visit: Admitting: *Deleted

## 2024-04-08 VITALS — Ht 66.0 in | Wt 171.0 lb

## 2024-04-08 DIAGNOSIS — Z0000 Encounter for general adult medical examination without abnormal findings: Secondary | ICD-10-CM

## 2024-04-08 NOTE — Progress Notes (Signed)
 "  Chief Complaint  Patient presents with   Medicare Wellness     Subjective:   Darryl [name] is a 74 y.o. male who presents for a Medicare Annual Wellness Visit.  No voiced or noted concerns at this time Patient advised to keep follow-up appointment with PCP (04-23-2024)   Visit info / Clinical Intake: Medicare Wellness Visit Type:: Subsequent Annual Wellness Visit Persons participating in visit and providing information:: patient Medicare Wellness Visit Mode:: Telephone If telephone:: video declined Since this visit was completed virtually, some vitals may be partially provided or unavailable. Missing vitals are due to the limitations of the virtual format.: Unable to obtain vitals - no equipment If Telephone or Video please confirm:: I connected with patient using audio/video enable telemedicine. I verified patient identity with two identifiers, discussed telehealth limitations, and patient agreed to proceed. Patient Location:: home Provider Location:: home Interpreter Needed?: No Pre-visit prep was completed: no AWV questionnaire completed by patient prior to visit?: no Living arrangements:: lives with spouse/significant other Patient's Overall Health Status Rating: good Typical amount of pain: none Does pain affect daily life?: no Are you currently prescribed opioids?: no  Dietary Habits and Nutritional Risks How many meals a day?: 2 Eats fruit and vegetables daily?: yes Most meals are obtained by: preparing own meals In the last 2 weeks, have you had any of the following?: none Diabetic:: no  Functional Status Activities of Daily Living (to include ambulation/medication): Independent Ambulation: Independent Medication Administration: Independent Home Management (perform basic housework or laundry): Independent Primary transportation is: driving Concerns about vision?: no *vision screening is required for WTM* Concerns about hearing?: no  Fall Screening Falls in  the past year?: 0 Number of falls in past year: 0 Was there an injury with Fall?: 0 Fall Risk Category Calculator: 0 Patient Fall Risk Level: Low Fall Risk  Fall Risk Patient at Risk for Falls Due to: No Fall Risks Fall risk Follow up: Falls evaluation completed; Education provided; Falls prevention discussed  Home and Transportation Safety: All rugs have non-skid backing?: (!) no All stairs or steps have railings?: (!) no Grab bars in the bathtub or shower?: yes Have non-skid surface in bathtub or shower?: (!) no Good home lighting?: yes Regular seat belt use?: yes Hospital stays in the last year:: no  Cognitive Assessment Difficulty concentrating, remembering, or making decisions? : no Will 6CIT or Mini Cog be Completed: yes What year is it?: 0 points What month is it?: 0 points Give patient an address phrase to remember (5 components): Its very sunny outside today in January About what time is it?: 0 points Count backwards from 20 to 1: 0 points Say the months of the year in reverse: 0 points Repeat the address phrase from earlier: 0 points 6 CIT Score: 0 points  Advance Directives (For Healthcare) Does Patient Have a Medical Advance Directive?: Yes Type of Advance Directive: Healthcare Power of Attorney Copy of Healthcare Power of Attorney in Chart?: No - copy requested  Reviewed/Updated  Reviewed/Updated: Reviewed All (Medical, Surgical, Family, Medications, Allergies, Care Teams, Patient Goals); Surgical History; Family History; Medications; Allergies; Care Teams; Patient Goals; Medical History    Allergies (verified) Allopurinol , Asacol  [mesalamine ], Mercaptopurine , Amoxicillin, Penicillins, and Sulfamethoxazole   Current Medications (verified) Outpatient Encounter Medications as of 04/08/2024  Medication Sig   acitretin (SORIATANE) 10 MG capsule Take 10 mg by mouth daily.   ADVAIR HFA 115-21 MCG/ACT inhaler Inhale 2 puffs into the lungs 2 (two) times daily.  clopidogrel  (PLAVIX ) 75 MG tablet TAKE ONE TABLET BY MOUTH ONCE DAILY   FARXIGA  10 MG TABS tablet TAKE ONE TABLET BY MOUTH DAILY BEFORE BREAKFAST   irbesartan  (AVAPRO ) 75 MG tablet Take 0.5 tablets (37.5 mg total) by mouth daily.   metoprolol  succinate (TOPROL -XL) 25 MG 24 hr tablet TAKE ONE TABLET BY MOUTH ONCE DAILY   montelukast  (SINGULAIR ) 10 MG tablet Take 1 tablet (10 mg total) by mouth at bedtime.   Multiple Vitamins-Minerals (CENTRUM SILVER PO) Take 1 tablet by mouth daily.   nitroGLYCERIN  (NITROSTAT ) 0.4 MG SL tablet PLACE 1 TAB UNDER TONGUE AS NEEDED FOR CHEST PAIN. MAY REPEAT EVERY 5 MIN FOR A TOTAL OF 3 DOSES.   pantoprazole  (PROTONIX ) 40 MG tablet TAKE ONE TABLET BY MOUTH DAILY   rosuvastatin  (CRESTOR ) 40 MG tablet TAKE 1 TABLET ONCE DAILY.   Semaglutide ,0.25 or 0.5MG /DOS, (OZEMPIC , 0.25 OR 0.5 MG/DOSE,) 2 MG/3ML SOPN Inject 0.25 mg into the skin once a week.   ustekinumab  (STELARA ) 90 MG/ML SOSY injection Inject 1 mL (90 mg total) into the skin every 6 (six) weeks.   VASCEPA  1 g capsule TAKE TWO CAPSULES BY MOUTH TWICE DAILY   VENTOLIN  HFA 108 (90 Base) MCG/ACT inhaler USE 2 PUFFS EVERY 6 HOURS AS NEEDED FOR WHEEZING.   No facility-administered encounter medications on file as of 04/08/2024.    History: Past Medical History:  Diagnosis Date   Allergic rhinitis, cause unspecified    Anal stenosis    Asthma    Cataract    Chronic orthostatic hypotension    Related to borderline hypotension; intolerant of blood pressure medications.  On Midodrine    Coronary artery disease involving native coronary artery of native heart without angina pectoris 06/2018    p-mRCA 100% (DES PCI - p-mRCA 100% (DES PCI - Resolute Onyx 3.5 x 26 --> ~3.7 mm)), dRCA ~50% & PAV 40%; pRI 85% (Staged DES PCI  Resolute Onyx 2.25 x 15 - ~2.5 mm). 100% LAD CTO after small D1 with ost-prox 60%. R-L collaterals to LAD seen post PCI.   Crohn's colitis (HCC)    Exostosis of unspecified site    GERD  (gastroesophageal reflux disease)    History of acute inferior wall MI 06/2018   100% p-mRCA -- DES PCI (complicated by mild cardiogenic shock, partially related to 100% LAD CTO) --> staged PCI-RI   Hyperlipidemia with target LDL less than 70 07/04/2015   Now s/p MI with MV CAD - optimal LDL < 50   Hyperplastic colon polyp 02/26/2008   Hypertension    Ischemic cardiomyopathy    Initial echo shows EF of 40 to 45% with anterior hypokinesis. Cardiac MRI shows EF of 49% with anterior anteroapical hypokinesis/akinesis and LV thrombus.   Lateral epicondylitis  of elbow    Left ventricular thrombus following MI [facility]) ==> resolved as of January 2021 10/10/2018   Cardiac MRI 09/28/2018: Mildly dilated LV.  EF~ 49%.  Mid Anteriopr/Anteroseptal & Apical Anterior HK.  LV APICAL THROMBUS (partially layered) 22 x 17 x 11. ==> b) f/u Echo 03/2019 -No LV Thrombus (NO LONGER ON WARFARIN)   Leukopenia    Macrocytic anemia    Related to azathioprine   Myocardial infarction [facility])    Nontraumatic rupture of patellar tendon    Obesity, unspecified    Regional enteritis of unspecified site    Seasonal allergies    Sessile colonic polyp    Vitamin B12 deficiency    Past Surgical History:  Procedure Laterality Date  CARDIAC MRI  09/2018   Mildly dilated LV.  EF estimated roughly 49%.  Hypokinesis of the mid anterior, anteroseptal and apical anterior wall.  There is also apical thrombus (partially layered) 22 x 17 x 11. -->  50-75% late gadolinium enhancement of the mid anterior, anteroseptal and apical anterior and septal walls suggestive of near full-thickness scar w/ minimal viability (poor recovery prognosis w/ REVASCULARIZATION   COLONOSCOPY     2016   CORONARY STENT INTERVENTION N/A 07/24/2018   Procedure: CORONARY STENT INTERVENTION;  Surgeon: Burnard Debby LABOR, MD;  Location: [facility] INVASIVE CV LAB;  Service: Cardiovascular: Staged PCI pRI 85-90%: Resolute Onyx DES 2.25 x 15 (2.5 mm)   CORONARY/GRAFT ACUTE MI  REVASCULARIZATION N/A 07/23/2018   Procedure: CORONARY/GRAFT ACUTE MI REVASCULARIZATION;  Surgeon: Anner Alm ORN, MD;  Location: [facility] INVASIVE CV LAB;  Service: Cardiovascular;::  p-mRCA 100% (DES PCI - p-mRCA 100% (DES PCI - Resolute Onyx 3.5 x 26 (post-dilated ~3.7 mm)), dRCA ~50% & PAV 40%; pRI 85% (Staged PCI). 100% LAD CTO after small D1 with ost-prox 60%. R-L collaterals to LAD seen post PCI.;    LEFT HEART CATH N/A 07/24/2018   Procedure: LEFT HEART CATH;  Surgeon: Burnard Debby LABOR, MD;  Location: [facility] INVASIVE CV LAB;  Service: Cardiovascular;  Laterality: N/A; 85-90% pRCA, 60% ost D1 then 100% CTO of LAD -- staged DES PCI LAD   LEFT HEART CATH AND CORONARY ANGIOGRAPHY  07/23/2018   Procedure: LEFT HEART CATH;  Surgeon: Anner Alm ORN., MD;  Location: [facility] INVASIVE CV LAB   p-mRCA 100% (DES PCI),dRCA ~50% & PAV 40%; pRI 85% (Staged PCI). 100% LAD CTO after small D1 with ost-prox 60%. R-L collaterals to LAD seen post PCI.;    RIGHT HEART CATH N/A 07/23/2018   Procedure: RIGHT HEART CATH;  Surgeon: Anner Alm ORN, MD;  Location: [facility] INVASIVE CV LAB;  Service: Cardiovascular;  Normal RHC #s, CO/CI normal   TONSILLECTOMY AND ADENOIDECTOMY     TRANSTHORACIC ECHOCARDIOGRAM  07/23/2018   Insetting of inferior MI with occluded LAD CTO; --Severe akinesis of the mid anteroseptal wall.  EF 40 and 45%.  GR 1 DD.;;     TRANSTHORACIC ECHOCARDIOGRAM  04/06/2019   To follow-up EF and LV thrombus:  EF 40 and 45%.  Distal septal and apical akinesis.  No LV thrombus noted.  Mild LVH.  GR 1 DD.  Normal RV.  Mild LA dilation.  Normal RA.  Mild aortic valve sclerosis with no stenosis - mild AI   --> warfarin discontinued.  Continued on Plavix  alone.   Family History  Problem Relation Age of Onset   Diabetes Brother    Diabetes Father    Heart attack Father    Hypertension Father    Heart disease Father    Colon cancer Neg Hx    Stomach cancer Neg Hx    Hyperlipidemia Neg Hx    Sudden death Neg Hx    Cancer Neg  Hx    COPD Neg Hx    Alcohol abuse Neg Hx    Drug abuse Neg Hx    Stroke Neg Hx    Rectal cancer Neg Hx    Esophageal cancer Neg Hx    Social History   Occupational History   Occupation: airline pilot    Comment: Part time  Tobacco Use   Smoking status: Never   Smokeless tobacco: Never  Vaping Use   Vaping status: Never Used  Substance and Sexual Activity   Alcohol use:  Yes    Alcohol/week: 0.0 standard drinks of alcohol    Comment: rare   Drug use: No   Sexual activity: Yes   Tobacco Counseling Counseling given: Not Answered  SDOH Screenings   Food Insecurity: No Food Insecurity (04/08/2024)  Housing: Unknown (04/08/2024)  Transportation Needs: No Transportation Needs (04/08/2024)  Utilities: Not At Risk (04/08/2024)  Alcohol Screen: Low Risk (01/29/2023)  Depression (PHQ2-9): Low Risk (04/08/2024)  Financial Resource Strain: Low Risk (01/29/2023)  Physical Activity: Sufficiently Active (04/08/2024)  Social Connections: Socially Integrated (04/08/2024)  Stress: No Stress Concern Present (04/08/2024)  Tobacco Use: Low Risk (04/08/2024)  Health Literacy: Adequate Health Literacy (04/08/2024)   See flowsheets for full screening details  Depression Screen PHQ 2 & 9 Depression Scale- Over the past 2 weeks, how often have you been bothered by any of the following problems? Little interest or pleasure in doing things: 0 Feeling down, depressed, or hopeless (PHQ Adolescent also includes...irritable): 0 PHQ-2 Total Score: 0 Trouble falling or staying asleep, or sleeping too much: 0 Feeling tired or having little energy: 0 Poor appetite or overeating (PHQ Adolescent also includes...weight loss): 0 Feeling bad about yourself - or that you are a failure or have let yourself or your family down: 0 Trouble concentrating on things, such as reading the newspaper or watching television (PHQ Adolescent also includes...like school work): 0 Moving or speaking so slowly that other people could  have noticed. Or the opposite - being so fidgety or restless that you have been moving around a lot more than usual: 0 Thoughts that you would be better off dead, or of hurting yourself in some way: 0 PHQ-9 Total Score: 0 If you checked off any problems, how difficult have these problems made it for you to do your work, take care of things at home, or get along with other people?: Not difficult at all     Goals Addressed             This Visit's Progress    Weight (lb) < 200 lb (90.7 kg)   171 lb (77.6 kg)            Objective:    Today's Vitals   04/08/24 1539  Weight: 171 lb (77.6 kg)  Height: 5' 6 (1.676 m)   Body mass index is 27.6 kg/m.  Hearing/Vision screen Hearing Screening - Comments:: No trouble hearing Vision Screening - Comments:: Milissa  Up to date Immunizations and Health Maintenance Health Maintenance  Topic Date Due   DTaP/Tdap/Td (2 - Td or Tdap) 05/20/2023   Diabetic kidney evaluation - Urine ACR  12/23/2023   FOOT EXAM  12/23/2023   COVID-19 Vaccine (5 - Pfizer risk 2025-26 season) 06/08/2024   HEMOGLOBIN A1C  07/22/2024   OPHTHALMOLOGY EXAM  09/02/2024   Diabetic kidney evaluation - eGFR measurement  01/21/2025   Medicare Annual Wellness (AWV)  04/08/2025   Colonoscopy  11/28/2025   Pneumococcal Vaccine: 50+ Years  Completed   Influenza Vaccine  Completed   Hepatitis C Screening  Completed   Zoster Vaccines- Shingrix   Completed   Meningococcal B Vaccine  Aged Out   Hepatitis B Vaccines 19-59 Average Risk  Discontinued        Assessment/Plan:  This is a routine wellness examination for O[place]ond.  Patient Care Team: Jerrell Cleatus Ned, MD as PCP - General (Internal Medicine) Center, [facility]  I have personally reviewed and noted the following in the patients chart:   Medical and social history Use  of alcohol, tobacco or illicit drugs  Current medications and supplements including opioid prescriptions. Functional ability and  status Nutritional status Physical activity Advanced directives List of other physicians Hospitalizations, surgeries, and ER visits in previous 12 months Vitals Screenings to include cognitive, depression, and falls Referrals and appointments  No orders of the defined types were placed in this encounter.  In addition, I have reviewed and discussed with patient certain preventive protocols, quality metrics, and best practice recommendations. A written personalized care plan for preventive services as well as general preventive health recommendations were provided to patient.   Mliss Graff, LPN   [id number]/84/7973   Return in 1 year (on 04/08/2025).  After Visit Summary: (MyChart) Due to this being a telephonic visit, the after visit summary with patients personalized plan was offered to patient via MyChart   Nurse Notes:  "

## 2024-04-08 NOTE — Patient Instructions (Signed)
 Mr. Bremer,  Thank you for taking the time for your Medicare Wellness Visit. I appreciate your continued commitment to your health goals. Please review the care plan we discussed, and feel free to reach out if I can assist you further.  Please note that Annual Wellness Visits do not include a physical exam. Some assessments may be limited, especially if the visit was conducted virtually. If needed, we may recommend an in-person follow-up with your provider.  Ongoing Care Seeing your primary care provider every 3 to 6 months helps us  monitor your health and provide consistent, personalized care.   Referrals If a referral was made during today's visit and you haven't received any updates within two weeks, please contact the referred provider directly to check on the status.  Recommended Screenings:  Health Maintenance  Topic Date Due   DTaP/Tdap/Td vaccine (2 - Td or Tdap) 05/20/2023   Kidney health urinalysis for diabetes  12/23/2023   Complete foot exam   12/23/2023   COVID-19 Vaccine (5 - Pfizer risk 2025-26 season) 06/08/2024   Hemoglobin A1C  07/22/2024   Eye exam for diabetics  09/02/2024   Yearly kidney function blood test for diabetes  01/21/2025   Medicare Annual Wellness Visit  04/08/2025   Colon Cancer Screening  11/28/2025   Pneumococcal Vaccine for age over 5  Completed   Flu Shot  Completed   Hepatitis C Screening  Completed   Zoster (Shingles) Vaccine  Completed   Meningitis B Vaccine  Aged Out   Hepatitis B Vaccine  Discontinued       04/08/2024    3:41 PM  Advanced Directives  Does Patient Have a Medical Advance Directive? Yes  Type of Advance Directive Healthcare Power of Attorney  Copy of Healthcare Power of Attorney in Chart? No - copy requested    Vision: Annual vision screenings are recommended for early detection of glaucoma, cataracts, and diabetic retinopathy. These exams can also reveal signs of chronic conditions such as diabetes and high blood  pressure.  Dental: Annual dental screenings help detect early signs of oral cancer, gum disease, and other conditions linked to overall health, including heart disease and diabetes.  Please see the attached documents for additional preventive care recommendations.    Mr. Face , Thank you for taking time to come for your Medicare Wellness Visit. I appreciate your ongoing commitment to your health goals. Please review the following plan we discussed and let me know if I can assist you in the future.   Screening recommendations/referrals: Colonoscopy:  Recommended yearly ophthalmology/optometry visit for glaucoma screening and checkup Recommended yearly dental visit for hygiene and checkup  Vaccinations: Influenza vaccine:  Pneumococcal vaccine:  Tdap vaccine:  Shingles vaccine:       Preventive Care 65 Years and Older, Male Preventive care refers to lifestyle choices and visits with your health care provider that can promote health and wellness. What does preventive care include? A yearly physical exam. This is also called an annual well check. Dental exams once or twice a year. Routine eye exams. Ask your health care provider how often you should have your eyes checked. Personal lifestyle choices, including: Daily care of your teeth and gums. Regular physical activity. Eating a healthy diet. Avoiding tobacco and drug use. Limiting alcohol use. Practicing safe sex. Taking low doses of aspirin  every day. Taking vitamin and mineral supplements as recommended by your health care provider. What happens during an annual well check? The services and screenings done by your health  care provider during your annual well check will depend on your age, overall health, lifestyle risk factors, and family history of disease. Counseling  Your health care provider may ask you questions about your: Alcohol use. Tobacco use. Drug use. Emotional well-being. Home and relationship  well-being. Sexual activity. Eating habits. History of falls. Memory and ability to understand (cognition). Work and work astronomer. Screening  You may have the following tests or measurements: Height, weight, and BMI. Blood pressure. Lipid and cholesterol levels. These may be checked every 5 years, or more frequently if you are over 29 years old. Skin check. Lung cancer screening. You may have this screening every year starting at age 76 if you have a 30-pack-year history of smoking and currently smoke or have quit within the past 15 years. Fecal occult blood test (FOBT) of the stool. You may have this test every year starting at age 1. Flexible sigmoidoscopy or colonoscopy. You may have a sigmoidoscopy every 5 years or a colonoscopy every 10 years starting at age 34. Prostate cancer screening. Recommendations will vary depending on your family history and other risks. Hepatitis C blood test. Hepatitis B blood test. Sexually transmitted disease (STD) testing. Diabetes screening. This is done by checking your blood sugar (glucose) after you have not eaten for a while (fasting). You may have this done every 1-3 years. Abdominal aortic aneurysm (AAA) screening. You may need this if you are a current or former smoker. Osteoporosis. You may be screened starting at age 84 if you are at high risk. Talk with your health care provider about your test results, treatment options, and if necessary, the need for more tests. Vaccines  Your health care provider may recommend certain vaccines, such as: Influenza vaccine. This is recommended every year. Tetanus, diphtheria, and acellular pertussis (Tdap, Td) vaccine. You may need a Td booster every 10 years. Zoster vaccine. You may need this after age 5. Pneumococcal 13-valent conjugate (PCV13) vaccine. One dose is recommended after age 63. Pneumococcal polysaccharide (PPSV23) vaccine. One dose is recommended after age 86. Talk to your health care  provider about which screenings and vaccines you need and how often you need them. This information is not intended to replace advice given to you by your health care provider. Make sure you discuss any questions you have with your health care provider. Document Released: 04/07/2015 Document Revised: 11/29/2015 Document Reviewed: 01/10/2015 Elsevier Interactive Patient Education  2017 [employer].  Fall Prevention in the Home Falls can cause injuries. They can happen to people of all ages. There are many things you can do to make your home safe and to help prevent falls. What can I do on the outside of my home? Regularly fix the edges of walkways and driveways and fix any cracks. Remove anything that might make you trip as you walk through a door, such as a raised step or threshold. Trim any bushes or trees on the path to your home. Use bright outdoor lighting. Clear any walking paths of anything that might make someone trip, such as rocks or tools. Regularly check to see if handrails are loose or broken. Make sure that both sides of any steps have handrails. Any raised decks and porches should have guardrails on the edges. Have any leaves, snow, or ice cleared regularly. Use sand or salt on walking paths during winter. Clean up any spills in your garage right away. This includes oil or grease spills. What can I do in the bathroom? Use night lights. Install  grab bars by the toilet and in the tub and shower. Do not use towel bars as grab bars. Use non-skid mats or decals in the tub or shower. If you need to sit down in the shower, use a plastic, non-slip stool. Keep the floor dry. Clean up any water that spills on the floor as soon as it happens. Remove soap buildup in the tub or shower regularly. Attach bath mats securely with double-sided non-slip rug tape. Do not have throw rugs and other things on the floor that can make you trip. What can I do in the bedroom? Use night lights. Make  sure that you have a light by your bed that is easy to reach. Do not use any sheets or blankets that are too big for your bed. They should not hang down onto the floor. Have a firm chair that has side arms. You can use this for support while you get dressed. Do not have throw rugs and other things on the floor that can make you trip. What can I do in the kitchen? Clean up any spills right away. Avoid walking on wet floors. Keep items that you use a lot in easy-to-reach places. If you need to reach something above you, use a strong step stool that has a grab bar. Keep electrical cords out of the way. Do not use floor polish or wax that makes floors slippery. If you must use wax, use non-skid floor wax. Do not have throw rugs and other things on the floor that can make you trip. What can I do with my stairs? Do not leave any items on the stairs. Make sure that there are handrails on both sides of the stairs and use them. Fix handrails that are broken or loose. Make sure that handrails are as long as the stairways. Check any carpeting to make sure that it is firmly attached to the stairs. Fix any carpet that is loose or worn. Avoid having throw rugs at the top or bottom of the stairs. If you do have throw rugs, attach them to the floor with carpet tape. Make sure that you have a light switch at the top of the stairs and the bottom of the stairs. If you do not have them, ask someone to add them for you. What else can I do to help prevent falls? Wear shoes that: Do not have high heels. Have rubber bottoms. Are comfortable and fit you well. Are closed at the toe. Do not wear sandals. If you use a stepladder: Make sure that it is fully opened. Do not climb a closed stepladder. Make sure that both sides of the stepladder are locked into place. Ask someone to hold it for you, if possible. Clearly mark and make sure that you can see: Any grab bars or handrails. First and last steps. Where the  edge of each step is. Use tools that help you move around (mobility aids) if they are needed. These include: Canes. Walkers. Scooters. Crutches. Turn on the lights when you go into a dark area. Replace any light bulbs as soon as they burn out. Set up your furniture so you have a clear path. Avoid moving your furniture around. If any of your floors are uneven, fix them. If there are any pets around you, be aware of where they are. Review your medicines with your doctor. Some medicines can make you feel dizzy. This can increase your chance of falling. Ask your doctor what other things that you can  do to help prevent falls. This information is not intended to replace advice given to you by your health care provider. Make sure you discuss any questions you have with your health care provider. Document Released: 01/05/2009 Document Revised: 08/17/2015 Document Reviewed: 04/15/2014 Elsevier Interactive Patient Education  2017 [employer].

## 2024-04-12 ENCOUNTER — Other Ambulatory Visit: Payer: Self-pay

## 2024-04-12 ENCOUNTER — Other Ambulatory Visit (HOSPITAL_COMMUNITY): Payer: Self-pay

## 2024-04-12 NOTE — Progress Notes (Unsigned)
 Benefits Investigation Started  Reason: New copay $200.  Routed to: Rx Prior [employer]

## 2024-04-13 ENCOUNTER — Other Ambulatory Visit (HOSPITAL_COMMUNITY): Payer: Self-pay

## 2024-04-14 ENCOUNTER — Other Ambulatory Visit: Payer: Self-pay

## 2024-04-16 ENCOUNTER — Other Ambulatory Visit (HOSPITAL_COMMUNITY): Payer: Self-pay

## 2024-04-19 ENCOUNTER — Other Ambulatory Visit (HOSPITAL_COMMUNITY): Payer: Self-pay

## 2024-04-20 ENCOUNTER — Other Ambulatory Visit: Payer: Self-pay

## 2024-04-20 ENCOUNTER — Other Ambulatory Visit (HOSPITAL_COMMUNITY): Payer: Self-pay

## 2024-04-20 NOTE — Progress Notes (Signed)
 Specialty Pharmacy Refill Coordination Note  Darryl [name] is a 74 y.o. male contacted today regarding refills of specialty medication(s) Ustekinumab  (STELARA )   Patient requested [name] at [facility] at [place] date: 04/22/24   Medication will be filled on: 04/21/24

## 2024-04-23 ENCOUNTER — Other Ambulatory Visit (HOSPITAL_COMMUNITY): Payer: Self-pay

## 2024-04-23 ENCOUNTER — Encounter: Payer: Self-pay | Admitting: Student in an Organized Health Care Education/Training Program

## 2024-04-23 ENCOUNTER — Ambulatory Visit: Admitting: Student in an Organized Health Care Education/Training Program

## 2024-04-23 VITALS — BP 116/73 | Ht 66.0 in | Wt 171.4 lb

## 2024-04-23 DIAGNOSIS — E1169 Type 2 diabetes mellitus with other specified complication: Secondary | ICD-10-CM

## 2024-04-23 DIAGNOSIS — K50918 Crohn's disease, unspecified, with other complication: Secondary | ICD-10-CM | POA: Diagnosis not present

## 2024-04-23 DIAGNOSIS — Z7985 Long-term (current) use of injectable non-insulin antidiabetic drugs: Secondary | ICD-10-CM

## 2024-04-23 DIAGNOSIS — I152 Hypertension secondary to endocrine disorders: Secondary | ICD-10-CM

## 2024-04-23 DIAGNOSIS — E1159 Type 2 diabetes mellitus with other circulatory complications: Secondary | ICD-10-CM | POA: Diagnosis not present

## 2024-04-23 DIAGNOSIS — E785 Hyperlipidemia, unspecified: Secondary | ICD-10-CM

## 2024-04-23 DIAGNOSIS — E118 Type 2 diabetes mellitus with unspecified complications: Secondary | ICD-10-CM

## 2024-04-23 LAB — MICROALBUMIN / CREATININE URINE RATIO
Creatinine,U: 90.8 mg/dL
Microalb Creat Ratio: 21 mg/g (ref 0.0–30.0)
Microalb, Ur: 1.9 mg/dL (ref 0.7–1.9)

## 2024-04-23 LAB — POCT GLYCOSYLATED HEMOGLOBIN (HGB A1C)
HbA1c POC (<> result, manual entry): 6.4 %
HbA1c, POC (controlled diabetic range): 6.4 % (ref 0.0–7.0)
HbA1c, POC (prediabetic range): 6.4 % (ref 5.7–6.4)
Hemoglobin A1C: 6.4 % — AB (ref 4.0–5.6)

## 2024-04-23 MED ORDER — OZEMPIC (0.25 OR 0.5 MG/DOSE) 2 MG/3ML ~~LOC~~ SOPN: 0.5000 mg | PEN_INJECTOR | SUBCUTANEOUS | 2 refills | Status: AC

## 2024-04-23 NOTE — Assessment & Plan Note (Signed)
 Chronic and stable.  He is on treatment with Stelara  injections every 6 weeks, we recently started Ozempic  and thankfully it has not affected his Crohn's disease.  No increase in bowel movements.  Still having about 1 loose bowel movement per day.  Noticing some really nice weight loss.

## 2024-04-23 NOTE — Assessment & Plan Note (Signed)
 Chronic and stable.  Doing well on current dosing of irbesartan .  Having some nice weight loss since starting Ozempic .  Blood pressure acceptable and he is doing a good job checking home readings.  Check urine microalbumin today.

## 2024-04-23 NOTE — Assessment & Plan Note (Addendum)
 Diabetes is well-controlled with an A1c of 6.4%. A 13-pound weight loss has improved glycemic control. The current Ozempic  dose causes manageable nausea and diarrhea. Discussed increasing the dose to 0.5 mg for better control and emphasized indefinite use for diabetes management. Increased Ozempic  to 0.5 mg weekly.  I suspect we may not need to increase to the 1 mg dose.  I do notice that he has some sarcopenia, likely losing some muscle mass as part of his weight loss.  We talked about increasing exercise and strength training.  He has Crohn's disease but thankfully no serious increase diarrhea since starting the Ozempic .  Monitor for adverse effects like nausea.  If he has increased GI symptoms, will back down to the 0.25 mg dose.  Continue lifestyle modifications, including exercise and dietary changes.  Will continue with Farxiga  and irbesartan  for renal protection.  Continue with rosuvastatin  for secondary ischemic prevention.  Check urine microalbumin today.

## 2024-04-23 NOTE — Assessment & Plan Note (Signed)
 Chronic and stable.  History of ischemic vascular disease.  Doing really well on rosuvastatin .  No issues with myalgias.

## 2024-04-23 NOTE — Patient Instructions (Signed)
" °  VISIT SUMMARY: Darryl [name], you visited today for a follow-up on your type 2 diabetes and other health concerns. You have been managing your diabetes well with Ozempic , and your A1c has improved. You have also lost 13 pounds over the past three months. We discussed your sleep difficulties, blood pressure, and your active lifestyle.  YOUR PLAN: -TYPE II DIABETES MELLITUS: Type II diabetes is a condition where your body does not use insulin properly, leading to high blood sugar levels. Your diabetes is well-controlled with an A1c of 6.4%, and you have lost 13 pounds, which has improved your blood sugar levels. We have increased your Ozempic  dose to 0.5 mg weekly to help with better control. Please monitor for any adverse effects like nausea. Continue with your exercise and dietary changes.  -ESSENTIAL HYPERTENSION: Essential hypertension is high blood pressure without a known cause. Your blood pressure is well-controlled at home with readings around 116/73 mmHg. Continue your current medication and monitor your blood pressure at home regularly.  -GENERAL HEALTH MAINTENANCE: You are engaging in regular exercise and maintaining a healthy lifestyle. Continue with your exercise routine, healthy diet, and social activities to support your overall health and memory maintenance.  INSTRUCTIONS: Please monitor for any adverse effects from the increased Ozempic  dose and continue to check your blood pressure at home regularly. Maintain your exercise routine and healthy lifestyle habits. Follow up with us  if you experience any new symptoms or have concerns.    Contains text generated by [employer].   "

## 2024-04-23 NOTE — Progress Notes (Signed)
 "  Established Patient Office Visit  Patient ID: Darryl [name], male    DOB: 11-Oct-1950  Age: 74 y.o. MRN: [id number] PCP: Jerrell Cleatus Ned, MD  Chief Complaint  Patient presents with   Follow-up    No concerns    Subjective:     HPI  Discussed the use of AI scribe software for clinical note transcription with the patient, who gave verbal consent to proceed.  History of Present Illness Darryl [name] is a 74 year old male with type 2 diabetes who presents for a follow-up visit.  He has been managing his type 2 diabetes with Ozempic , initially experiencing nausea and diarrhea, which have become manageable. He notes a significant decrease in appetite and has lost 13 pounds over the past three months. He is currently on a dose of 0.25 mg. His A1c has improved, and he feels better overall.  He has been experiencing difficulty sleeping and is attempting to improve his sleep by going to bed earlier. No falls have been reported.  He has been monitoring his blood pressure at home, which was initially around 116/73 mmHg after starting Ozempic , but more recently in the 120s. He has started exercising regularly, finding a 30-minute walking route with hills, although the recent weather has impacted his routine.  His wife has early signs of dementia and is on medication for it, but she experienced AFib as a side effect. He maintains an active social life, attending hockey games and socializing with friends.  He reports only one or two loose bowel movements per day. No falls.     Objective:     BP 116/73   Ht 5' 6 (1.676 m)   Wt 171 lb 6.4 oz (77.7 kg)   SpO2 (!) 75%   BMI 27.66 kg/m    Physical Exam  Gen: Well-appearing man Neck: Normal thyroid , no nodules or adenopathy Heart: Regular, no murmur Lungs: Unlabored, clear throughout Ext: Moderate sarcopenia, no edema, warm and well-perfused    Results for orders placed or performed in visit on 04/23/24   POCT HgB A1C  Result Value Ref Range   Hemoglobin A1C 6.4 (A) 4.0 - 5.6 %   HbA1c POC (<> result, manual entry) 6.4 4.0 - 5.6 %   HbA1c, POC (prediabetic range) 6.4 5.7 - 6.4 %   HbA1c, POC (controlled diabetic range) 6.4 0.0 - 7.0 %      Assessment & Plan:   Problem List Items Addressed This Visit       High   Crohn's disease (HCC) (Chronic)   Chronic and stable.  He is on treatment with Stelara  injections every 6 weeks, we recently started Ozempic  and thankfully it has not affected his Crohn's disease.  No increase in bowel movements.  Still having about 1 loose bowel movement per day.  Noticing some really nice weight loss.      Type II diabetes mellitus with manifestations (HCC) - Primary (Chronic)   Diabetes is well-controlled with an A1c of 6.4%. A 13-pound weight loss has improved glycemic control. The current Ozempic  dose causes manageable nausea and diarrhea. Discussed increasing the dose to 0.5 mg for better control and emphasized indefinite use for diabetes management. Increased Ozempic  to 0.5 mg weekly.  I suspect we may not need to increase to the 1 mg dose.  I do notice that he has some sarcopenia, likely losing some muscle mass as part of his weight loss.  We talked about increasing exercise and strength training.  He has Crohn's disease but thankfully no serious increase diarrhea since starting the Ozempic .  Monitor for adverse effects like nausea.  If he has increased GI symptoms, will back down to the 0.25 mg dose.  Continue lifestyle modifications, including exercise and dietary changes.  Will continue with Farxiga  and irbesartan  for renal protection.  Continue with rosuvastatin  for secondary ischemic prevention.  Check urine microalbumin today.      Relevant Medications   Semaglutide ,0.25 or 0.5MG /DOS, (OZEMPIC , 0.25 OR 0.5 MG/DOSE,) 2 MG/3ML SOPN   Other Relevant Orders   POCT HgB A1C (Completed)   Microalbumin / creatinine urine ratio     Medium    Hypertension  associated with diabetes (HCC) (Chronic)   Chronic and stable.  Doing well on current dosing of irbesartan .  Having some nice weight loss since starting Ozempic .  Blood pressure acceptable and he is doing a good job checking home readings.  Check urine microalbumin today.      Relevant Medications   Semaglutide ,0.25 or 0.5MG /DOS, (OZEMPIC , 0.25 OR 0.5 MG/DOSE,) 2 MG/3ML SOPN   Hyperlipidemia associated with type 2 diabetes mellitus (HCC) (Chronic)   Chronic and stable.  History of ischemic vascular disease.  Doing really well on rosuvastatin .  No issues with myalgias.      Relevant Medications   Semaglutide ,0.25 or 0.5MG /DOS, (OZEMPIC , 0.25 OR 0.5 MG/DOSE,) 2 MG/3ML SOPN    Return in about 3 months (around 07/22/2024) for DM management.    Cleatus Debby Specking, MD [facility]   "

## 2024-07-22 ENCOUNTER — Ambulatory Visit: Admitting: Student in an Organized Health Care Education/Training Program

## 2024-07-23 ENCOUNTER — Ambulatory Visit: Admitting: Student in an Organized Health Care Education/Training Program
